# Patient Record
Sex: Female | Born: 1945
Health system: Southern US, Community
[De-identification: ages and names within clinical notes are randomized; demographics above are authoritative.]

## PROBLEM LIST (undated history)

## (undated) DIAGNOSIS — I10 Essential (primary) hypertension: Secondary | ICD-10-CM

## (undated) DIAGNOSIS — I639 Cerebral infarction, unspecified: Secondary | ICD-10-CM

## (undated) DIAGNOSIS — I480 Paroxysmal atrial fibrillation: Secondary | ICD-10-CM

## (undated) DIAGNOSIS — Z9889 Other specified postprocedural states: Secondary | ICD-10-CM

## (undated) DIAGNOSIS — Z79899 Other long term (current) drug therapy: Secondary | ICD-10-CM

## (undated) DIAGNOSIS — F32A Depression, unspecified: Secondary | ICD-10-CM

## (undated) DIAGNOSIS — K219 Gastro-esophageal reflux disease without esophagitis: Secondary | ICD-10-CM

## (undated) DIAGNOSIS — E785 Hyperlipidemia, unspecified: Secondary | ICD-10-CM

## (undated) DIAGNOSIS — E669 Obesity, unspecified: Secondary | ICD-10-CM

## (undated) HISTORY — DX: Other long term (current) drug therapy: Z79.899

## (undated) HISTORY — DX: Essential (primary) hypertension: I10

## (undated) HISTORY — PX: ANKLE SURGERY: SHX546

## (undated) HISTORY — DX: Paroxysmal atrial fibrillation: I48.0

## (undated) HISTORY — PX: CHOLECYSTECTOMY: SHX55

## (undated) HISTORY — PX: ABDOMINAL HYSTERECTOMY: SHX81

## (undated) HISTORY — PX: WRIST SURGERY: SHX841

## (undated) HISTORY — PX: APPENDECTOMY: SHX54

## (undated) HISTORY — DX: Hyperlipidemia, unspecified: E78.5

## (undated) HISTORY — PX: OTHER SURGICAL HISTORY: SHX169

## (undated) HISTORY — DX: Gastro-esophageal reflux disease without esophagitis: K21.9

## (undated) HISTORY — PX: EYE SURGERY: SHX253

## (undated) HISTORY — DX: Obesity, unspecified: E66.9

---

## 1999-03-05 ENCOUNTER — Ambulatory Visit (HOSPITAL_COMMUNITY): Admission: RE | Admit: 1999-03-05 | Discharge: 1999-03-05 | Payer: Self-pay | Admitting: Obstetrics and Gynecology

## 2000-03-19 ENCOUNTER — Encounter: Admission: RE | Admit: 2000-03-19 | Discharge: 2000-03-19 | Payer: Self-pay | Admitting: Obstetrics and Gynecology

## 2000-03-19 ENCOUNTER — Encounter: Payer: Self-pay | Admitting: Obstetrics and Gynecology

## 2001-02-09 ENCOUNTER — Encounter: Admission: RE | Admit: 2001-02-09 | Discharge: 2001-02-09 | Payer: Self-pay | Admitting: Family Medicine

## 2001-03-17 ENCOUNTER — Encounter: Payer: Self-pay | Admitting: Internal Medicine

## 2001-04-06 ENCOUNTER — Encounter: Payer: Self-pay | Admitting: Obstetrics and Gynecology

## 2001-04-06 ENCOUNTER — Encounter: Admission: RE | Admit: 2001-04-06 | Discharge: 2001-04-06 | Payer: Self-pay | Admitting: Obstetrics and Gynecology

## 2002-03-30 ENCOUNTER — Other Ambulatory Visit: Admission: RE | Admit: 2002-03-30 | Discharge: 2002-03-30 | Payer: Self-pay | Admitting: Gynecology

## 2003-04-10 ENCOUNTER — Other Ambulatory Visit: Admission: RE | Admit: 2003-04-10 | Discharge: 2003-04-10 | Payer: Self-pay | Admitting: Gynecology

## 2004-04-10 ENCOUNTER — Other Ambulatory Visit: Admission: RE | Admit: 2004-04-10 | Discharge: 2004-04-10 | Payer: Self-pay | Admitting: Gynecology

## 2005-02-18 ENCOUNTER — Ambulatory Visit: Payer: Self-pay | Admitting: Gastroenterology

## 2005-03-15 ENCOUNTER — Emergency Department (HOSPITAL_COMMUNITY): Admission: EM | Admit: 2005-03-15 | Discharge: 2005-03-15 | Payer: Self-pay | Admitting: Emergency Medicine

## 2005-03-18 ENCOUNTER — Encounter: Admission: RE | Admit: 2005-03-18 | Discharge: 2005-03-18 | Payer: Self-pay | Admitting: Orthopedic Surgery

## 2005-04-01 ENCOUNTER — Encounter: Admission: RE | Admit: 2005-04-01 | Discharge: 2005-04-01 | Payer: Self-pay | Admitting: Gynecology

## 2005-04-21 ENCOUNTER — Other Ambulatory Visit: Admission: RE | Admit: 2005-04-21 | Discharge: 2005-04-21 | Payer: Self-pay | Admitting: Gynecology

## 2005-10-28 ENCOUNTER — Ambulatory Visit: Payer: Self-pay | Admitting: Internal Medicine

## 2006-05-25 ENCOUNTER — Other Ambulatory Visit: Admission: RE | Admit: 2006-05-25 | Discharge: 2006-05-25 | Payer: Self-pay | Admitting: Gynecology

## 2006-06-09 ENCOUNTER — Encounter: Admission: RE | Admit: 2006-06-09 | Discharge: 2006-06-09 | Payer: Self-pay | Admitting: Gynecology

## 2007-05-31 ENCOUNTER — Other Ambulatory Visit: Admission: RE | Admit: 2007-05-31 | Discharge: 2007-05-31 | Payer: Self-pay | Admitting: Gynecology

## 2007-06-11 ENCOUNTER — Ambulatory Visit: Payer: Self-pay | Admitting: Internal Medicine

## 2007-06-12 ENCOUNTER — Encounter: Payer: Self-pay | Admitting: Internal Medicine

## 2007-06-12 DIAGNOSIS — F411 Generalized anxiety disorder: Secondary | ICD-10-CM | POA: Insufficient documentation

## 2007-06-12 DIAGNOSIS — R51 Headache: Secondary | ICD-10-CM

## 2007-06-12 DIAGNOSIS — F329 Major depressive disorder, single episode, unspecified: Secondary | ICD-10-CM

## 2007-06-12 DIAGNOSIS — R519 Headache, unspecified: Secondary | ICD-10-CM | POA: Insufficient documentation

## 2007-07-11 ENCOUNTER — Emergency Department (HOSPITAL_COMMUNITY): Admission: EM | Admit: 2007-07-11 | Discharge: 2007-07-11 | Payer: Self-pay | Admitting: Emergency Medicine

## 2007-07-21 HISTORY — PX: CARDIOVASCULAR STRESS TEST: SHX262

## 2008-01-12 ENCOUNTER — Encounter: Payer: Self-pay | Admitting: Internal Medicine

## 2008-01-19 ENCOUNTER — Encounter: Payer: Self-pay | Admitting: Internal Medicine

## 2008-06-23 ENCOUNTER — Encounter: Payer: Self-pay | Admitting: Internal Medicine

## 2008-06-27 ENCOUNTER — Encounter: Admission: RE | Admit: 2008-06-27 | Discharge: 2008-06-27 | Payer: Self-pay | Admitting: Gynecology

## 2009-01-26 ENCOUNTER — Emergency Department (HOSPITAL_COMMUNITY): Admission: EM | Admit: 2009-01-26 | Discharge: 2009-01-26 | Payer: Self-pay | Admitting: Emergency Medicine

## 2009-03-06 ENCOUNTER — Ambulatory Visit: Payer: Self-pay | Admitting: Internal Medicine

## 2009-03-06 DIAGNOSIS — M549 Dorsalgia, unspecified: Secondary | ICD-10-CM | POA: Insufficient documentation

## 2009-03-06 DIAGNOSIS — K219 Gastro-esophageal reflux disease without esophagitis: Secondary | ICD-10-CM

## 2009-05-31 ENCOUNTER — Ambulatory Visit: Payer: Self-pay | Admitting: Internal Medicine

## 2009-05-31 DIAGNOSIS — R197 Diarrhea, unspecified: Secondary | ICD-10-CM

## 2009-05-31 LAB — CONVERTED CEMR LAB
Basophils Relative: 0.7 % (ref 0.0–3.0)
Calcium: 9.2 mg/dL (ref 8.4–10.5)
Glucose, Bld: 85 mg/dL (ref 70–99)
HCT: 40.4 % (ref 36.0–46.0)
Hemoglobin: 14.2 g/dL (ref 12.0–15.0)
Lymphocytes Relative: 22.4 % (ref 12.0–46.0)
MCHC: 35.3 g/dL (ref 30.0–36.0)
MCV: 86.9 fL (ref 78.0–100.0)
Neutrophils Relative %: 63 % (ref 43.0–77.0)
Platelets: 219 10*3/uL (ref 150.0–400.0)
Potassium: 4.1 meq/L (ref 3.5–5.1)
Sodium: 142 meq/L (ref 135–145)

## 2009-06-01 ENCOUNTER — Encounter: Payer: Self-pay | Admitting: Internal Medicine

## 2009-09-04 ENCOUNTER — Encounter: Admission: RE | Admit: 2009-09-04 | Discharge: 2009-09-04 | Payer: Self-pay | Admitting: Gynecology

## 2010-07-18 ENCOUNTER — Ambulatory Visit: Payer: Self-pay | Admitting: Cardiology

## 2010-07-25 ENCOUNTER — Ambulatory Visit: Payer: Self-pay | Admitting: Cardiology

## 2010-07-25 ENCOUNTER — Ambulatory Visit (HOSPITAL_COMMUNITY)
Admission: RE | Admit: 2010-07-25 | Discharge: 2010-07-25 | Payer: Self-pay | Source: Home / Self Care | Admitting: Cardiology

## 2010-07-25 HISTORY — PX: OTHER SURGICAL HISTORY: SHX169

## 2010-09-10 ENCOUNTER — Encounter: Admission: RE | Admit: 2010-09-10 | Discharge: 2010-09-10 | Payer: Self-pay | Admitting: Gynecology

## 2011-01-01 ENCOUNTER — Ambulatory Visit (INDEPENDENT_AMBULATORY_CARE_PROVIDER_SITE_OTHER): Payer: Private Health Insurance - Indemnity | Admitting: *Deleted

## 2011-01-01 DIAGNOSIS — I10 Essential (primary) hypertension: Secondary | ICD-10-CM

## 2011-01-01 DIAGNOSIS — I4891 Unspecified atrial fibrillation: Secondary | ICD-10-CM

## 2011-02-27 LAB — URINALYSIS, ROUTINE W REFLEX MICROSCOPIC
Bilirubin Urine: NEGATIVE
Nitrite: NEGATIVE
Protein, ur: NEGATIVE mg/dL
pH: 5.5 (ref 5.0–8.0)

## 2011-02-27 LAB — DIFFERENTIAL
Basophils Relative: 1 % (ref 0–1)
Eosinophils Absolute: 0.2 10*3/uL (ref 0.0–0.7)
Eosinophils Relative: 4 % (ref 0–5)
Lymphs Abs: 1.2 10*3/uL (ref 0.7–4.0)
Monocytes Absolute: 0.7 10*3/uL (ref 0.1–1.0)
Monocytes Relative: 9 % (ref 3–12)
Neutrophils Relative %: 70 % (ref 43–77)

## 2011-02-27 LAB — PROTIME-INR
INR: 1 (ref 0.00–1.49)
Prothrombin Time: 12.8 seconds (ref 11.6–15.2)

## 2011-02-27 LAB — COMPREHENSIVE METABOLIC PANEL
AST: 24 U/L (ref 0–37)
Albumin: 3.6 g/dL (ref 3.5–5.2)
Glucose, Bld: 124 mg/dL — ABNORMAL HIGH (ref 70–99)
Potassium: 3.8 mEq/L (ref 3.5–5.1)

## 2011-02-27 LAB — CK TOTAL AND CKMB (NOT AT ARMC)
CK, MB: 1 ng/mL (ref 0.3–4.0)
Total CK: 51 U/L (ref 7–177)

## 2011-02-27 LAB — TROPONIN I: Troponin I: <0.01 ng/mL (ref 0.00–0.06)

## 2011-02-27 LAB — CBC
MCHC: 35.2 g/dL (ref 30.0–36.0)
Platelets: 219 10*3/uL (ref 150–400)
WBC: 7.2 10*3/uL (ref 4.0–10.5)

## 2011-02-27 LAB — LIPASE, BLOOD: Lipase: 27 U/L (ref 11–59)

## 2011-02-27 LAB — POCT CARDIAC MARKERS: CKMB, poc: <1 ng/mL — ABNORMAL LOW (ref 1.0–8.0)

## 2011-02-27 LAB — APTT: aPTT: 39 seconds — ABNORMAL HIGH (ref 24–37)

## 2011-03-11 ENCOUNTER — Other Ambulatory Visit: Payer: Self-pay | Admitting: Cardiology

## 2011-03-11 DIAGNOSIS — I480 Paroxysmal atrial fibrillation: Secondary | ICD-10-CM

## 2011-03-12 NOTE — Telephone Encounter (Signed)
escribe medication per fax request  

## 2011-06-24 ENCOUNTER — Inpatient Hospital Stay (HOSPITAL_COMMUNITY)
Admission: EM | Admit: 2011-06-24 | Discharge: 2011-06-25 | DRG: 310 | Disposition: A | Discharge status: Home / Self Care | Payer: Managed Care, Other (non HMO) | Attending: Family Medicine | Admitting: Family Medicine

## 2011-06-24 DIAGNOSIS — I10 Essential (primary) hypertension: Secondary | ICD-10-CM | POA: Diagnosis present

## 2011-06-24 DIAGNOSIS — Z7982 Long term (current) use of aspirin: Secondary | ICD-10-CM

## 2011-06-24 DIAGNOSIS — E785 Hyperlipidemia, unspecified: Secondary | ICD-10-CM | POA: Diagnosis present

## 2011-06-24 DIAGNOSIS — I4891 Unspecified atrial fibrillation: Principal | ICD-10-CM | POA: Diagnosis present

## 2011-06-24 DIAGNOSIS — F17200 Nicotine dependence, unspecified, uncomplicated: Secondary | ICD-10-CM | POA: Diagnosis present

## 2011-06-24 DIAGNOSIS — E349 Endocrine disorder, unspecified: Secondary | ICD-10-CM | POA: Diagnosis present

## 2011-06-25 ENCOUNTER — Emergency Department (HOSPITAL_COMMUNITY): Payer: Managed Care, Other (non HMO)

## 2011-06-25 LAB — CBC
HCT: 43.7 % (ref 36.0–46.0)
Hemoglobin: 14.6 g/dL (ref 12.0–15.0)
MCH: 29.1 pg (ref 26.0–34.0)
MCHC: 33.4 g/dL (ref 30.0–36.0)
RBC: 5.02 MIL/uL (ref 3.87–5.11)

## 2011-06-25 LAB — DIFFERENTIAL
Basophils Relative: 0 % (ref 0–1)
Lymphocytes Relative: 25 % (ref 12–46)
Monocytes Absolute: 0.8 10*3/uL (ref 0.1–1.0)
Monocytes Relative: 9 % (ref 3–12)
Neutro Abs: 5.1 10*3/uL (ref 1.7–7.7)
Neutrophils Relative %: 62 % (ref 43–77)

## 2011-06-25 LAB — BASIC METABOLIC PANEL
BUN: 16 mg/dL (ref 6–23)
CO2: 23 mEq/L (ref 19–32)
Calcium: 9.9 mg/dL (ref 8.4–10.5)
Chloride: 103 mEq/L (ref 96–112)
Creatinine, Ser: 0.74 mg/dL (ref 0.50–1.10)
Glucose, Bld: 107 mg/dL — ABNORMAL HIGH (ref 70–99)

## 2011-06-25 LAB — TSH: TSH: 5.877 u[IU]/mL — ABNORMAL HIGH (ref 0.350–4.500)

## 2011-06-25 LAB — PROTIME-INR: Prothrombin Time: 13.1 seconds (ref 11.6–15.2)

## 2011-06-25 LAB — T4, FREE: Free T4: 1.02 ng/dL (ref 0.80–1.80)

## 2011-06-26 NOTE — Discharge Summary (Signed)
[name], [name] NO.:  [id number]  MEDICAL RECORD NO.:  [id number]  LOCATION:  1227                         FACILITY:  [facility]  PHYSICIAN:  Brendia Sacks, MD    DATE OF BIRTH:  03-30-1946  DATE OF ADMISSION:  06/24/2011 DATE OF DISCHARGE:  06/25/2011                              DISCHARGE SUMMARY   PRIMARY CARE PHYSICIAN:  Dr. Ann Maki T. Stoneking.  PRIMARY CARDIOLOGIST:  Dr. Peter M. Swaziland.  CONDITION ON DISCHARGE:  Improved.  DISPOSITION:  Home.  DISCHARGE DIAGNOSES: 1. Atrial fibrillation with rapid ventricular response, resolved. 2. Elevated thyroid-stimulating hormone. 3. Known history of atrial fibrillation, managed on chronic aspirin     therapy.  HISTORY OF PRESENT ILLNESS:  This is a 65 year old woman with a known history of atrial fibrillation managed with metoprolol and aspirin who presented to the emergency room with palpitations.  She was found to be in atrial fibrillation with rapid ventricular response by her report with a heart rate of 210, was treated with diltiazem and admitted for further evaluation.  HOSPITAL COURSE:  The patient was given IV diltiazem, and by the admitting physician's note, she converted to sinus rhythm quickly.  She was observed in the ICU overnight, and her heart rate has been stable. Review of telemetry demonstrates sinus rhythm with sinus bradycardia, with no pauses or arrhythmias.  The patient has no chest pain or shortness of breath and would like to go home this morning.  She does note she has frequent episodes of palpitations that usually self resolve with cessation of activity.  Usually when she has these palpitations, her heart rate is in the 80s to 90s.  When she was at home, just most recent time, her heart rate was up to 110 and it did not cease after a couple of hours, so she came to the emergency room.  She had no chest pain with this and minimal shortness of breath.  She has no history  of coronary artery disease.  She had no echocardiogram on file; however, she reports Dr. Swaziland has one on the file at his office.  She had been compliant with her medications.  As her heart rate is in the 50s this morning, she is asymptomatic and she is normotensive, make no changes to metoprolol at this time.  This most recent episode occurred when she was taking beans out in the afternoon.  I have recommended she followup with Dr. Swaziland in the next 1-2 weeks and with Dr. Pete Glatter in about 2 weeks in regard to her hypothyroidism.  I did discuss the results of the TSH with her.  I would defer further evaluation including the possibility of T3 and T4 and repeat TSH testing to Dr. Pete Glatter.  The patient did have a troponin checked in the emergency room which was negative.  As she had no chest pain with this, no significant shortness of breath.  I do not think any further cardiac evaluation will be indicated.  Her history by her report is significant for repeated episodes of palpitations.  There is no evidence to suggest angina at this point.  Of note, one day prior to admission, she did have an episode of presyncope,  but no palpitations at that time.  She went inside and laid down at that time and vomited once and afterwards felt tired, otherwise had no significant clinic complaints.  Her history is suggestive of vasovagal presyncope.  IMAGING:  Chest x-ray, June 25, 2011:  No acute cardiopulmonary findings.  PERTINENT LABORATORY STUDIES: 1. Troponin at point of care was within normal limits. 2. CBC was unremarkable. 3. Basic metabolic panel was unremarkable. 4. TSH was elevated at 5.877. 5. Free T4 was within normal limits at 1.02.  EKG is not obtainable at this point.  It is not on the chart.  PHYSICAL EXAMINATION:  GENERAL:  On discharge, the patient is feeling well.  No chest pain or shortness of breath. VITALS:  Temperature is 97.6 and pulse 54.  Telemetry shows  sinus bradycardia, blood pressure is 117/53, respiratory rate 15, and saturation 99% on room air. CARDIOVASCULAR:  Regular rhythm, bradycardic.  No murmur, rub, or gallop. EXTREMITIES:  No lower extremity edema. RESPIRATION:  Clear to auscultation bilaterally.  No wheezes, rales, or rhonchi.  Normal respiratory effort.  DISCHARGE INSTRUCTIONS:  The patient will be discharged home today.  She should follow up with Dr. Pete Glatter in approximately 2 weeks to follow up her elevated TSH.  Now her free T4 was within normal limits. Followup with Dr. Peter Swaziland in 1-2 weeks.  Activities unrestricted. Continuous low-sodium heart-healthy diet.  Advised discontinuing smoking and using a pain patch if you are not smoking.  DISCHARGE MEDICATIONS:  No changes made at this time.  Continue, 1. Aspirin 81 mg p.o. daily. 2. Metoprolol 25 mg p.o. b.i.d. 3. Nicotine transdermal patch 14 mg per 24 hours daily if not smoking. 4. Zoloft 100 mg p.o. daily.  Things to follow up in the outpatient setting. 1. Elevated TSH.  See discussion above. 2. Atrial fibrillation.  See discussion above.  Time coordinating discharge is 25 minutes.     Brendia Sacks, MD    DG/MEDQ  D:  06/25/2011  T:  06/25/2011  Job:  [id number]  cc:   Hal T. Stoneking, M.D. Fax: [phone]  Peter M. Swaziland, M.D. Fax: [phone]  Electronically Signed by Brendia Sacks  on 06/26/2011 05:51:14 PM

## 2011-07-01 NOTE — H&P (Signed)
  [name], [name]            ACCOUNT NO.:  [id number]  MEDICAL RECORD NO.:  [id number]  LOCATION:  1227                         FACILITY:  [facility]  PHYSICIAN:  Gery Pray, MD      DATE OF BIRTH:  1946/02/10  DATE OF ADMISSION:  06/24/2011 DATE OF DISCHARGE:                             HISTORY & PHYSICAL   ADDENDUM:  EKG, atrial fibrillation with RVR.          ______________________________ Gery Pray, MD     DC/MEDQ  D:  06/25/2011  T:  06/25/2011  Job:  [id number]  Electronically Signed by Gery Pray MD on 07/01/2011 02:37:45 AM

## 2011-07-01 NOTE — H&P (Signed)
[name], SEEFELD            ACCOUNT NO.:  [id number]  MEDICAL RECORD NO.:  [id number]  LOCATION:  WLED                         FACILITY:  [facility]  PHYSICIAN:  Gery Pray, MD      DATE OF BIRTH:  06-May-1946  DATE OF ADMISSION:  06/24/2011 DATE OF DISCHARGE:                             HISTORY & PHYSICAL   PRIMARY CARE PHYSICIAN:  Hal T. Pete Glatter, MD  CARDIOLOGIST:  Peter M. Swaziland, MD  CODE STATUS:  Full code.  The patient goes to team 2.  CHIEF COMPLAINT:  Palpitation.  HISTORY OF PRESENT ILLNESS:  This is a pleasant 65 year old female with known history of AFib who is maintained on metoprolol.  She states she is compliant to medication.  She states she occasionally has palpitations, but they usually quickly self resolve; however, yesterday she was sitting on the porch, she was resting when she became dizzy and presyncopal.  She became diaphoretic.  There is no chest pain.  She went in to house, laid down, developed some nausea and vomiting once.  She states she felt it run down the rest of the day.  This evening, I spoke to her at around 2:30 she was outside, picking beans.  She states became hot and again developed palpitations.  At this time, she again stated no chest pains, but she had some mild shortness of breath.  No wheezing. She reports no fever, no chills, no abdominal pain, no recurrent presyncopal episode.  She decided to come to the ER.  The patient is not on anticoagulation.  She is maintained on aspirin daily.  History obtained from the patient who is reliable.  PAST MEDICAL HISTORY: 1. Atrial fibrillation. 2. Hypertension. 3. Dyslipidemia.  PAST SURGICAL HISTORY:  Hysterectomy.  MEDICATIONS: 1. Metoprolol 25 mg p.o. b.i.d. 2. Sertraline 100 mg half tablet daily. 3. Aspirin 81 mg daily.  ALLERGIES:  PENICILLIN, she states this cause loss of consciousness.  SOCIAL HISTORY:  Positive for tobacco.  Negative for alcohol or illicit drug.  She  lives with her husband.  The patient is not on home oxygen, does not use assistive devices.  FAMILY HISTORY:  Significant for dad with coronary artery disease and CHF, mom had Alzheimer's.  REVIEW OF SYSTEMS:  All 10-point systems reviewed are negative except as noted in HPI.  PHYSICAL EXAMINATION:  VITAL SIGNS:  Blood pressure 115/78, pulse 92, respirations 18, temperature 97.9. GENERAL:  Alert and oriented female, currently in no acute distress. EYES:  Pink conjunctivae.  PERRLA.  No scleral icterus. ENT:  Moist oral mucosa.  Trachea midline. NECK:  Supple.  No carotid bruits. LUNGS:  Clear to auscultation bilaterally.  No wheeze appreciated.  No use of accessory muscles. CARDIOVASCULAR:  Currently, regular rate and rhythm without murmurs, rubs, or gallops.  No JVD. ABDOMEN:  Soft, positive bowel sounds, nontender, nondistended.  No organomegaly. NEUROLOGIC:  Cranial nerves II through XII grossly intact.  Sensation intact.   MUSCULOSKELETAL: Strength 5/5 in all extremities. no edema SKIN:  No rashes.  No subcutaneous crepitations. PSYCHIATRIC:  Patient is alert, oriented, and appropriate.  LABORATORY DATA:  TSH 5.87.  White blood count 8.3, hemoglobin 14.6, platelets 230.  INR 0.97.  Sodium 137,  potassium 4, chloride 103, CO2 of 23, glucose 105, BUN 16, creatinine 0.74.  Troponin 0.02.  Chest x-ray shows acute cardiopulmonary findings.  ASSESSMENT AND PLAN: 1. Atrial fibrillation with rapid ventricular response.  Here in the     ER, the patient was placed on Cardizem drip.  She was just     converted to sinus rhythm.  We will admit the patient and restart     her home medications, metoprolol 25 mg p.o. b.i.d. and order for     discontinuing of the Cardizem drip.  Continue the patient's     aspirin. 2. Tobacco abuse.  Nicotine patch and counseling will be ordered. 3. Hypertension. 4. Dyslipidemia.  Continue home medications.  The patient will be     placed on DVT  prophylaxis.          ______________________________ Gery Pray, MD     DC/MEDQ  D:  06/25/2011  T:  06/25/2011  Job:  [id number]  Electronically Signed by Gery Pray MD on 07/01/2011 02:37:38 AM

## 2011-08-29 LAB — I-STAT 8, (EC8 V) (CONVERTED LAB)
BUN: 10
Bicarbonate: 26.7 — ABNORMAL HIGH
Chloride: 106
pCO2, Ven: 41.8 — ABNORMAL LOW
pH, Ven: 7.413 — ABNORMAL HIGH

## 2011-08-29 LAB — POCT CARDIAC MARKERS
Myoglobin, poc: 77.1
Operator id: 257131
Troponin i, poc: <0.05

## 2011-08-29 LAB — POCT I-STAT CREATININE
Creatinine, Ser: 0.9
Operator id: 257131

## 2011-08-29 LAB — D-DIMER, QUANTITATIVE: D-Dimer, Quant: 0.24

## 2011-09-03 ENCOUNTER — Other Ambulatory Visit: Payer: Self-pay | Admitting: Gynecology

## 2011-09-03 DIAGNOSIS — Z1231 Encounter for screening mammogram for malignant neoplasm of breast: Secondary | ICD-10-CM

## 2011-09-10 ENCOUNTER — Telehealth: Payer: Self-pay | Admitting: *Deleted

## 2011-09-10 NOTE — Telephone Encounter (Signed)
Husband Aurther Loft) called stating her BP this AM was 88/57. States she had to get up 4 times during night to urinate. Also had severe pain in back and legs;fatiqued. Has not taken her Metoprolol this AM. Per Dr. Swaziland advised to push fluids and hold Metoprolol today. If sx or BP not any better then needs to call Dr. Pete Glatter.

## 2011-09-15 ENCOUNTER — Ambulatory Visit
Admission: RE | Admit: 2011-09-15 | Discharge: 2011-09-15 | Disposition: A | Discharge status: Home / Self Care | Payer: Managed Care, Other (non HMO) | Source: Ambulatory Visit | Attending: Gynecology | Admitting: Gynecology

## 2011-09-15 ENCOUNTER — Other Ambulatory Visit: Payer: Self-pay | Admitting: Gynecology

## 2011-09-15 ENCOUNTER — Other Ambulatory Visit: Payer: Self-pay | Admitting: Cardiology

## 2011-09-15 DIAGNOSIS — Z1231 Encounter for screening mammogram for malignant neoplasm of breast: Secondary | ICD-10-CM

## 2011-09-16 ENCOUNTER — Other Ambulatory Visit: Payer: Self-pay | Admitting: Cardiology

## 2011-11-27 ENCOUNTER — Encounter: Payer: Self-pay | Admitting: Nurse Practitioner

## 2011-11-27 ENCOUNTER — Encounter: Payer: Self-pay | Admitting: Cardiology

## 2011-11-27 ENCOUNTER — Ambulatory Visit (INDEPENDENT_AMBULATORY_CARE_PROVIDER_SITE_OTHER): Payer: Managed Care, Other (non HMO) | Admitting: Cardiology

## 2011-11-27 VITALS — BP 108/58 | HR 64 | Ht 66.0 in | Wt 203.8 lb

## 2011-11-27 DIAGNOSIS — I480 Paroxysmal atrial fibrillation: Secondary | ICD-10-CM | POA: Insufficient documentation

## 2011-11-27 DIAGNOSIS — I4891 Unspecified atrial fibrillation: Secondary | ICD-10-CM

## 2011-11-27 MED ORDER — FLECAINIDE ACETATE 50 MG PO TABS: 50.0000 mg | ORAL_TABLET | Freq: Two times a day (BID) | ORAL | Status: DC

## 2011-11-27 NOTE — Patient Instructions (Signed)
We will try you on Flecainide 50 mg twice a day.  I will see you again in 1 month to see how you are doing.

## 2011-11-27 NOTE — Progress Notes (Signed)
   [name] Date of Birth: 01-Jul-1946 Medical Record #[id number]  History of Present Illness: [name] is seen today for followup of her paroxysmal atrial fibrillation. She reports increased frequency and severity of her symptoms. Previously her symptoms would last only minutes and would resolve after taking her metoprolol. Now her atrial fibrillation appears to be lasting all night long. She has had increased frequency of symptoms over the past 6 weeks. There've been no changes in her medical therapy. She does report a flulike illness recently. Her atrial fibrillation episodes are occurring one to 3 times per week.  Current Outpatient Prescriptions on File Prior to Visit  Medication Sig Dispense Refill  . metoprolol tartrate (LOPRESSOR) 25 MG tablet TAKE 1 TABLET TWICE A DAY  60 tablet  5    Allergies  Allergen Reactions  . Penicillins     Past Medical History  Diagnosis Date  . Atrial fibrillation     paroxysmal  . Hypertension   . Dyslipidemia   . GERD (gastroesophageal reflux disease)   . Obesity     Past Surgical History  Procedure Date  . Wrist surgery     RIGHT WRIST  . Ankle surgery   . Stress echo test 07/25/2010    EF 60%  . Cardiovascular stress test 07/21/2007    EF 80%, NORMAL NONDIAGONISTIC FOR ISCHEMIA  . Abdominal hysterectomy     History  Smoking status  . Former Smoker  . Quit date: 11/17/2010  Smokeless tobacco  . Not on file    History  Alcohol Use No    Family History  Problem Relation Age of Onset  . Alzheimer's disease Mother   . Heart disease Father   . Heart attack Sister 100  . Lung cancer Brother   . Stroke Sister     Review of Systems: The review of systems is positive for palpitations.  All other systems were reviewed and are negative.  Physical Exam: BP 108/58  Pulse 64  Ht 5\' 6"  (1.676 m)  Wt 203 lb 12.8 oz (92.443 kg)  BMI 32.89 kg/m2 The patient is alert and oriented x 3.  The mood and affect are normal.   The skin is warm and dry.  Color is normal.  The HEENT exam reveals that the sclera are nonicteric.  The mucous membranes are moist.  The carotids are 2+ without bruits.  There is no thyromegaly.  There is no JVD.  The lungs are clear.  The chest wall is non tender.  The heart exam reveals a regular rate with a normal S1 and S2.  There are no murmurs, gallops, or rubs.  The PMI is not displaced.   Abdominal exam reveals good bowel sounds.  There is no guarding or rebound.  There is no hepatosplenomegaly or tenderness.  There are no masses.  Exam of the legs reveal no clubbing, cyanosis, or edema.  The legs are without rashes.  The distal pulses are intact.  Cranial nerves II - XII are intact.  Motor and sensory functions are intact.  The gait is normal.   LABORATORY DATA: ECG demonstrates normal sinus rhythm with a normal ECG.  Assessment / Plan:

## 2011-11-27 NOTE — Assessment & Plan Note (Addendum)
She has increased frequency and severity of her symptoms. She had a normal stress echo in September of 2011. She is well beta blocked on metoprolol. I recommended flecainide 50 mg twice a day to see if we can suppress her arrhythmia. I will have her followup with me in one month with an ECG.

## 2012-01-02 ENCOUNTER — Ambulatory Visit (INDEPENDENT_AMBULATORY_CARE_PROVIDER_SITE_OTHER): Payer: Managed Care, Other (non HMO) | Admitting: Cardiology

## 2012-01-02 ENCOUNTER — Encounter: Payer: Self-pay | Admitting: Cardiology

## 2012-01-02 VITALS — BP 124/72 | HR 64 | Ht 66.0 in | Wt 203.0 lb

## 2012-01-02 DIAGNOSIS — I4891 Unspecified atrial fibrillation: Secondary | ICD-10-CM

## 2012-01-02 DIAGNOSIS — I480 Paroxysmal atrial fibrillation: Secondary | ICD-10-CM

## 2012-01-02 NOTE — Progress Notes (Signed)
[name] Date of Birth: 11-22-1945 Medical Record #[id number]  History of Present Illness: [name] is seen today for followup of her paroxysmal atrial fibrillation. Since she was started on flecainide she has only had 2 episodes of atrial fibrillation. These each lasted 20-30 minutes. His is a marked improvement over her previous symptoms. She is tolerating the medications well. She has started working out at the gym 2-3 days a week.  Current Outpatient Prescriptions on File Prior to Visit  Medication Sig Dispense Refill  . aspirin 81 MG tablet Take 81 mg by mouth daily.      . cholecalciferol (VITAMIN D) 1000 UNITS tablet Take 1,000 Units by mouth daily.      . fish oil-omega-3 fatty acids 1000 MG capsule Take 2 g by mouth daily.      . flecainide (TAMBOCOR) 50 MG tablet Take 1 tablet (50 mg total) by mouth 2 (two) times daily.  60 tablet  11  . metoprolol tartrate (LOPRESSOR) 25 MG tablet TAKE 1 TABLET TWICE A DAY  60 tablet  5  . Multiple Vitamin (MULTI-VITAMIN PO) Take by mouth daily.      . sertraline (ZOLOFT) 50 MG tablet Take 50 mg by mouth daily.        Allergies  Allergen Reactions  . Penicillins     Past Medical History  Diagnosis Date  . Atrial fibrillation     paroxysmal  . Hypertension   . Dyslipidemia   . GERD (gastroesophageal reflux disease)   . Obesity     Past Surgical History  Procedure Date  . Wrist surgery     RIGHT WRIST  . Ankle surgery   . Stress echo test 07/25/2010    EF 60%  . Cardiovascular stress test 07/21/2007    EF 80%, NORMAL NONDIAGONISTIC FOR ISCHEMIA  . Abdominal hysterectomy     History  Smoking status  . Former Smoker  . Quit date: 11/17/2010  Smokeless tobacco  . Not on file    History  Alcohol Use No    Family History  Problem Relation Age of Onset  . Alzheimer's disease Mother   . Heart disease Father   . Heart attack Sister 57  . Lung cancer Brother   . Stroke Sister     Review of Systems: The  review of systems is positive for palpitations.  All other systems were reviewed and are negative.  Physical Exam: BP 124/72  Pulse 64  Ht 5\' 6"  (1.676 m)  Wt 203 lb (92.08 kg)  BMI 32.76 kg/m2 The patient is alert and oriented x 3.  The mood and affect are normal.  The skin is warm and dry.  Color is normal.  The HEENT exam reveals that the sclera are nonicteric.  The mucous membranes are moist.  The carotids are 2+ without bruits.  There is no thyromegaly.  There is no JVD.  The lungs are clear.  The chest wall is non tender.  The heart exam reveals a regular rate with a normal S1 and S2.  There are no murmurs, gallops, or rubs.  The PMI is not displaced.   Abdominal exam reveals good bowel sounds.  There is no guarding or rebound.  There is no hepatosplenomegaly or tenderness.  There are no masses.  Exam of the legs reveal no clubbing, cyanosis, or edema.  The legs are without rashes.  The distal pulses are intact.  Cranial nerves II - XII are intact.  Motor and sensory  functions are intact.  The gait is normal.   LABORATORY DATA: ECG demonstrates normal sinus rhythm with occasional PACs, otherwise  normal ECG.  Assessment / Plan:

## 2012-01-02 NOTE — Assessment & Plan Note (Signed)
Significant improvement in her symptoms of atrial fibrillation on flecainide 50 mg twice a day. We will continue with her current therapy. If she has significant breakthrough she may take an extra tablet. Her [place] score is 1. We will continue with aspirin at this time. I will see her back again in 6 months.

## 2012-01-02 NOTE — Patient Instructions (Signed)
Continue your current therapy. If you have longer episodes of atrial fib you could take an extra Flecainide.  I will see you in 6 months.

## 2012-03-29 ENCOUNTER — Other Ambulatory Visit: Payer: Self-pay | Admitting: *Deleted

## 2012-03-29 MED ORDER — METOPROLOL TARTRATE 25 MG PO TABS: 25.0000 mg | ORAL_TABLET | Freq: Two times a day (BID) | ORAL | Status: DC

## 2012-04-02 ENCOUNTER — Ambulatory Visit (INDEPENDENT_AMBULATORY_CARE_PROVIDER_SITE_OTHER): Payer: Managed Care, Other (non HMO) | Admitting: Nurse Practitioner

## 2012-04-02 ENCOUNTER — Encounter: Payer: Self-pay | Admitting: Nurse Practitioner

## 2012-04-02 VITALS — BP 122/70 | HR 60 | Ht 66.0 in | Wt 203.0 lb

## 2012-04-02 DIAGNOSIS — I4891 Unspecified atrial fibrillation: Secondary | ICD-10-CM

## 2012-04-02 DIAGNOSIS — I480 Paroxysmal atrial fibrillation: Secondary | ICD-10-CM

## 2012-04-02 LAB — BASIC METABOLIC PANEL
BUN: 14 mg/dL (ref 6–23)
CO2: 24 mEq/L (ref 19–32)
Calcium: 9.5 mg/dL (ref 8.4–10.5)
Chloride: 109 mEq/L (ref 96–112)
Creatinine, Ser: 0.9 mg/dL (ref 0.4–1.2)
GFR: 71.47 mL/min (ref >60.00)
Glucose, Bld: 86 mg/dL (ref 70–99)
Potassium: 4.3 mEq/L (ref 3.5–5.1)
Sodium: 141 mEq/L (ref 135–145)

## 2012-04-02 LAB — TSH: TSH: 2.66 u[IU]/mL (ref 0.35–5.50)

## 2012-04-02 MED ORDER — FLECAINIDE ACETATE 50 MG PO TABS: ORAL_TABLET | ORAL | Status: DC

## 2012-04-02 NOTE — Assessment & Plan Note (Signed)
Patient presents with recurrent PAF. She is on Flecainide. We are checking chemistries, TSH and flecainide level today. Will update her stress echo. I have increased her Flecainide to 100 mg in the am and 50 mg in the pm. Dr. Swaziland and I will see her back in about one month. If she continues, could consider event monitor and possible EP referral. Patient is agreeable to this plan and will call if any problems develop in the interim.

## 2012-04-02 NOTE — Progress Notes (Signed)
[name] Date of Birth: 08/17/1946 Medical Record #[id number]  History of Present Illness: [name] is seen today for a work in visit. She is seen for Dr. Swaziland. She has a history of PAF, obesity, prior tobacco abuse, HTN and HLD. She has had a negative stress echo back in 2011. She has been maintained on chronic Flecainide since January of this year. Her CHADs is only 1.   She comes in today. She is here alone. She has had recurrent atrial fib. She notes that when she was here in February she was doing very well. The flecainide was working well up until the past couple of weeks. She is having more episodes that initially only lasted for a few minutes. She had one earlier this week on Wednesday that lasted for several hours. She feels short winded and lightheaded with these episodes. Did not take any extra medicine. No recent labs. Does report a general decrease in her activity level. No actual chest pain. Still trying to to go the gym but this past Tuesday had "no energy" and was not in atrial fib.   Current Outpatient Prescriptions on File Prior to Visit  Medication Sig Dispense Refill  . aspirin 81 MG tablet Take 81 mg by mouth daily.      . metoprolol tartrate (LOPRESSOR) 25 MG tablet Take 1 tablet (25 mg total) by mouth 2 (two) times daily.  60 tablet  5  . sertraline (ZOLOFT) 50 MG tablet Take 25 mg by mouth daily.       Marland Kitchen DISCONTD: flecainide (TAMBOCOR) 50 MG tablet Take 1 tablet (50 mg total) by mouth 2 (two) times daily.  60 tablet  11    Allergies  Allergen Reactions  . Penicillins     Past Medical History  Diagnosis Date  . PAF (paroxysmal atrial fibrillation)     normal stress echo in 2011  . Hypertension   . Dyslipidemia   . GERD (gastroesophageal reflux disease)   . Obesity     Past Surgical History  Procedure Date  . Wrist surgery     RIGHT WRIST  . Ankle surgery   . Stress echo test 07/25/2010    EF 60%  . Cardiovascular stress test 07/21/2007   EF 80%, NORMAL NONDIAGONISTIC FOR ISCHEMIA  . Abdominal hysterectomy     History  Smoking status  . Current Everyday Smoker  . Last Attempt to Quit: 11/17/2010  Smokeless tobacco  . Not on file    History  Alcohol Use No    Family History  Problem Relation Age of Onset  . Alzheimer's disease Mother   . Heart disease Father   . Stroke Sister   . Lung cancer Brother   . Cancer Brother     Review of Systems: The review of systems is per the HPI.  All other systems were reviewed and are negative.  Physical Exam: BP 122/70  Pulse 60  Ht 5\' 6"  (1.676 m)  Wt 203 lb (92.08 kg)  BMI 32.76 kg/m2 Patient is very pleasant and in no acute distress. She is obese. Skin is warm and dry. Color is normal.  HEENT is unremarkable. Normocephalic/atraumatic. PERRL. Sclera are nonicteric. Neck is supple. No masses. No JVD. Lungs are clear. Cardiac exam shows a regular rate and rhythm. Abdomen is obese but soft. Extremities are without edema. Gait and ROM are intact. No gross neurologic deficits noted.  LABORATORY DATA: EKG today shows sinus bradycardia with a rate of 59.  Lab Results  Component Value Date   WBC 8.3 06/25/2011   HGB 14.6 06/25/2011   HCT 43.7 06/25/2011   PLT 230 06/25/2011   GLUCOSE 107* 06/25/2011   ALT 33 01/26/2009   AST 24 01/26/2009   NA 137 06/25/2011   K 4.0 06/25/2011   CL 103 06/25/2011   CREATININE 0.74 06/25/2011   BUN 16 06/25/2011   CO2 23 06/25/2011   TSH 5.877* 06/25/2011   INR 0.97 06/25/2011     Assessment / Plan:

## 2012-04-02 NOTE — Patient Instructions (Signed)
We are going to increase the Flecainide to 100 mg in the am and 50 mg in the pm  We are checking labs today  We are going to arrange for stress echocardiogram  Dr. Swaziland and I will see you in a month.   Call the [facility] office at [phone] if you have any questions, problems or concerns.

## 2012-04-08 LAB — FLECAINIDE LEVEL: Flecainide: 0.17 ug/mL — ABNORMAL LOW (ref 0.20–1.00)

## 2012-04-09 ENCOUNTER — Encounter: Payer: Self-pay | Admitting: Cardiology

## 2012-04-09 ENCOUNTER — Ambulatory Visit (HOSPITAL_BASED_OUTPATIENT_CLINIC_OR_DEPARTMENT_OTHER): Payer: Managed Care, Other (non HMO)

## 2012-04-09 ENCOUNTER — Ambulatory Visit (HOSPITAL_COMMUNITY): Payer: Managed Care, Other (non HMO) | Attending: Cardiology

## 2012-04-09 DIAGNOSIS — E785 Hyperlipidemia, unspecified: Secondary | ICD-10-CM | POA: Insufficient documentation

## 2012-04-09 DIAGNOSIS — E669 Obesity, unspecified: Secondary | ICD-10-CM | POA: Insufficient documentation

## 2012-04-09 DIAGNOSIS — I10 Essential (primary) hypertension: Secondary | ICD-10-CM | POA: Insufficient documentation

## 2012-04-09 DIAGNOSIS — I4891 Unspecified atrial fibrillation: Secondary | ICD-10-CM | POA: Insufficient documentation

## 2012-04-09 DIAGNOSIS — R0989 Other specified symptoms and signs involving the circulatory and respiratory systems: Secondary | ICD-10-CM

## 2012-04-09 DIAGNOSIS — F17200 Nicotine dependence, unspecified, uncomplicated: Secondary | ICD-10-CM | POA: Insufficient documentation

## 2012-04-09 DIAGNOSIS — R0609 Other forms of dyspnea: Secondary | ICD-10-CM | POA: Insufficient documentation

## 2012-04-19 ENCOUNTER — Other Ambulatory Visit: Payer: Self-pay | Admitting: *Deleted

## 2012-04-19 DIAGNOSIS — I4891 Unspecified atrial fibrillation: Secondary | ICD-10-CM

## 2012-04-19 MED ORDER — FLECAINIDE ACETATE 50 MG PO TABS: ORAL_TABLET | ORAL | Status: DC

## 2012-05-03 ENCOUNTER — Ambulatory Visit (INDEPENDENT_AMBULATORY_CARE_PROVIDER_SITE_OTHER): Payer: Managed Care, Other (non HMO) | Admitting: Nurse Practitioner

## 2012-05-03 ENCOUNTER — Encounter: Payer: Self-pay | Admitting: Nurse Practitioner

## 2012-05-03 VITALS — BP 140/80 | HR 60 | Ht 66.0 in | Wt 206.8 lb

## 2012-05-03 DIAGNOSIS — R109 Unspecified abdominal pain: Secondary | ICD-10-CM

## 2012-05-03 DIAGNOSIS — I480 Paroxysmal atrial fibrillation: Secondary | ICD-10-CM

## 2012-05-03 DIAGNOSIS — I4891 Unspecified atrial fibrillation: Secondary | ICD-10-CM

## 2012-05-03 LAB — URINALYSIS, ROUTINE W REFLEX MICROSCOPIC
Bilirubin Urine: NEGATIVE
Ketones, ur: NEGATIVE
Leukocytes, UA: NEGATIVE
Nitrite: NEGATIVE
Specific Gravity, Urine: >=1.03 (ref 1.000–1.030)
Total Protein, Urine: NEGATIVE
Urine Glucose: NEGATIVE
Urobilinogen, UA: 0.2 (ref 0.0–1.0)
pH: 5.5 (ref 5.0–8.0)

## 2012-05-03 NOTE — Patient Instructions (Addendum)
Stay on your current medicines  We will check a urine today.  See Dr. Swaziland in 2 months with an EKG.  Call the [facility] office at [phone] if you have any questions, problems or concerns.

## 2012-05-03 NOTE — Assessment & Plan Note (Signed)
She is doing well with her current regimen. No changes today. Will check a UA to follow up her complaint of flank pain and dysuria. I will have her see Dr. Swaziland back in about 2 months. Encouraged her to stay active. Watch the heat and stay hydrated. Patient is agreeable to this plan and will call if any problems develop in the interim.

## 2012-05-03 NOTE — Progress Notes (Signed)
[name] Date of Birth: 07/07/1946 Medical Record #[id number]  History of Present Illness: [name] is seen today for a follow up visit. She is seen for Dr. Sw[place]il[place]. She has a history of PAF, obesity, prior tobacco abuse, HTN and HLD. She is now on Flecainide and has a recent negative stress echo with a recent increase in her dose.   She comes in today. She is here with her grandson. She is doing well. Very little in the way of palpitations. Overall feels pretty good. Some fatigue but feels its related to her beta blocker. No chest pain. She is happy with how she is currently doing with her current regimen. Her only issue is some right sided flank pain and she thinks her urine has more of an odor. She is requesting a UA today.   Current Outpatient Prescriptions on File Prior to Visit  Medication Sig Dispense Refill  . aspirin 81 MG tablet Take 81 mg by mouth daily.      . flecainide (TAMBOCOR) 50 MG tablet Take 100 mg in the am and 50 mg in the evening  90 tablet  11  . metoprolol tartrate (LOPRESSOR) 25 MG tablet Take 1 tablet (25 mg total) by mouth 2 (two) times daily.  60 tablet  5  . sertraline (ZOLOFT) 50 MG tablet Take 25 mg by mouth daily.         Allergies  Allergen Reactions  . Penicillins     Past Medical History  Diagnosis Date  . PAF (paroxysmal atrial fibrillation)     normal stress echo in 2011  . Hypertension   . Dyslipidemia   . GERD (gastroesophageal reflux disease)   . Obesity   . High risk medication use     on Flecainide since January of 2013    Past Surgical History  Procedure Date  . Wrist surgery     RIGHT WRIST  . Ankle surgery   . Stress echo test 07/25/2010    EF 60%  . Cardiovascular stress test 07/21/2007    EF 80%, NORMAL NONDIAGONISTIC FOR ISCHEMIA  . Abdominal hysterectomy     History  Smoking status  . Current Everyday Smoker  Smokeless tobacco  . Not on file    History  Alcohol Use No    Family History  Problem  Relation Age of Onset  . Alzheimer's disease Mother   . Heart disease Father   . Stroke Sister   . Lung cancer Brother   . Cancer Brother     Review of Systems: The review of systems is positive for some fatigue.  All other systems were reviewed and are negative.  Physical Exam: BP 140/80  Pulse 60  Ht 5\' 6"  (1.676 m)  Wt 206 lb 12.8 oz (93.804 kg)  BMI 33.38 kg/m2 Patient is very pleasant and in no acute distress. Skin is warm and dry. Color is normal.  HEENT is unremarkable. Normocephalic/atraumatic. PERRL. Sclera are nonicteric. Neck is supple. No masses. No JVD. Lungs are clear. Cardiac exam shows a regular rate and rhythm. Abdomen is soft. Extremities are without edema. Gait and ROM are intact. No gross neurologic deficits noted.   LABORATORY DATA:  Stress Echo Study Conclusions  - Stress ECG conclusions: There was frequent pacs and runs of PAT. The stress ECG was negative for ischemia. - Staged echo: There was no echocardiographic evidence for stress-induced ischemia. Impressions:  - Stress echocardiogram with no chest pain and no ST changes; frequent PACs and occasional  run of PAT; no stress-induced wall motion abnormalities.  Lab Results  Component Value Date   WBC 8.3 06/25/2011   HGB 14.6 06/25/2011   HCT 43.7 06/25/2011   PLT 230 06/25/2011   GLUCOSE 86 04/02/2012   ALT 33 01/26/2009   AST 24 01/26/2009   NA 141 04/02/2012   K 4.3 04/02/2012   CL 109 04/02/2012   CREATININE 0.9 04/02/2012   BUN 14 04/02/2012   CO2 24 04/02/2012   TSH 2.66 04/02/2012   INR 0.97 06/25/2011    Flecainide level 0.17  Assessment / Plan:

## 2012-07-06 ENCOUNTER — Encounter: Payer: Self-pay | Admitting: Cardiology

## 2012-07-06 ENCOUNTER — Ambulatory Visit (INDEPENDENT_AMBULATORY_CARE_PROVIDER_SITE_OTHER): Payer: Managed Care, Other (non HMO) | Admitting: Cardiology

## 2012-07-06 VITALS — BP 122/66 | HR 56 | Ht 66.0 in | Wt 204.4 lb

## 2012-07-06 DIAGNOSIS — I4891 Unspecified atrial fibrillation: Secondary | ICD-10-CM

## 2012-07-06 DIAGNOSIS — I10 Essential (primary) hypertension: Secondary | ICD-10-CM

## 2012-07-06 NOTE — Progress Notes (Signed)
   [name] Date of Birth: 1945/12/16 Medical Record #[id number]  History of Present Illness: [name] is seen today for a follow up visit.  She has a history of PAF, obesity, prior tobacco abuse, HTN and HLD. Since her flecainide dose was increased she has had no recurrent palpitations. She is tolerating this medication well. Reports that she quit smoking one week ago.    Current Outpatient Prescriptions on File Prior to Visit  Medication Sig Dispense Refill  . aspirin 81 MG tablet Take 81 mg by mouth daily.      . flecainide (TAMBOCOR) 50 MG tablet Take 100 mg in the am and 50 mg in the evening  90 tablet  11  . metoprolol tartrate (LOPRESSOR) 25 MG tablet Take 1 tablet (25 mg total) by mouth 2 (two) times daily.  60 tablet  5  . sertraline (ZOLOFT) 50 MG tablet Take 25 mg by mouth daily.         Allergies  Allergen Reactions  . Penicillins     Past Medical History  Diagnosis Date  . PAF (paroxysmal atrial fibrillation)     normal stress echo in 2011  . Hypertension   . Dyslipidemia   . GERD (gastroesophageal reflux disease)   . Obesity   . High risk medication use     on Flecainide since January of 2013    Past Surgical History  Procedure Date  . Wrist surgery     RIGHT WRIST  . Ankle surgery   . Stress echo test 07/25/2010    EF 60%  . Cardiovascular stress test 07/21/2007    EF 80%, NORMAL NONDIAGONISTIC FOR ISCHEMIA  . Abdominal hysterectomy     History  Smoking status  . Current Everyday Smoker  Smokeless tobacco  . Not on file    History  Alcohol Use No    Family History  Problem Relation Age of Onset  . Alzheimer's disease Mother   . Heart disease Father   . Stroke Sister   . Lung cancer Brother   . Cancer Brother     Review of Systems: The review of systems is positive for some fatigue.  All other systems were reviewed and are negative.  Physical Exam: BP 122/66  Pulse 56  Ht 5\' 6"  (1.676 m)  Wt 92.715 kg (204 lb 6.4 oz)  BMI  32.99 kg/m2 Patient is very pleasant and in no acute distress. Skin is warm and dry. Color is normal.  HEENT is unremarkable. Normocephalic/atraumatic. PERRL. Sclera are nonicteric. Neck is supple. No masses. No JVD. Lungs are clear. Cardiac exam shows a regular rate and rhythm. Abdomen is soft. Extremities are without edema. Gait and ROM are intact. No gross neurologic deficits noted.   LABORATORY DATA: ECG today demonstrates sinus bradycardia with a rate of 55 beats per minute. It is otherwise normal.  Assessment / Plan: 1. Paroxysmal atrial fibrillation. Well controlled on flecainide. We will continue with 100 mg twice daily. Continue Lopressor. Given [place] score of 1, we'll continue with aspirin only. Next  2. Hypertension, well controlled.

## 2012-07-06 NOTE — Patient Instructions (Signed)
Continue your current therapy and good luck quitting smoking  I will see you again in 6 months.

## 2012-09-14 ENCOUNTER — Other Ambulatory Visit: Payer: Self-pay | Admitting: Gynecology

## 2012-09-14 DIAGNOSIS — Z1231 Encounter for screening mammogram for malignant neoplasm of breast: Secondary | ICD-10-CM

## 2012-09-21 ENCOUNTER — Other Ambulatory Visit: Payer: Self-pay | Admitting: Gynecology

## 2012-10-05 ENCOUNTER — Other Ambulatory Visit: Payer: Self-pay | Admitting: *Deleted

## 2012-10-05 MED ORDER — METOPROLOL TARTRATE 25 MG PO TABS: 25.0000 mg | ORAL_TABLET | Freq: Two times a day (BID) | ORAL | Status: DC

## 2012-10-25 ENCOUNTER — Ambulatory Visit: Payer: Managed Care, Other (non HMO)

## 2012-11-26 ENCOUNTER — Ambulatory Visit
Admission: RE | Admit: 2012-11-26 | Discharge: 2012-11-26 | Disposition: A | Discharge status: Home / Self Care | Payer: Managed Care, Other (non HMO) | Source: Ambulatory Visit | Attending: Gynecology | Admitting: Gynecology

## 2012-11-26 DIAGNOSIS — Z1231 Encounter for screening mammogram for malignant neoplasm of breast: Secondary | ICD-10-CM | POA: Diagnosis not present

## 2013-03-01 DIAGNOSIS — K644 Residual hemorrhoidal skin tags: Secondary | ICD-10-CM | POA: Diagnosis not present

## 2013-03-01 DIAGNOSIS — N390 Urinary tract infection, site not specified: Secondary | ICD-10-CM | POA: Diagnosis not present

## 2013-03-01 DIAGNOSIS — K649 Unspecified hemorrhoids: Secondary | ICD-10-CM | POA: Diagnosis not present

## 2013-04-14 ENCOUNTER — Other Ambulatory Visit: Payer: Self-pay | Admitting: *Deleted

## 2013-04-14 MED ORDER — METOPROLOL TARTRATE 25 MG PO TABS: 25.0000 mg | ORAL_TABLET | Freq: Two times a day (BID) | ORAL | Status: DC

## 2013-04-23 ENCOUNTER — Other Ambulatory Visit: Payer: Self-pay | Admitting: Cardiology

## 2013-04-25 NOTE — Telephone Encounter (Signed)
07/06/2012 11:15 AM Office Visit flecainide (TAMBOCOR) 50 MG tablet  Take 100 mg in the am and 50 mg in the evening   90 tablet   11   Assessment / Plan: 1. Paroxysmal atrial fibrillation. Well controlled on flecainide. We will continue with 100 mg twice daily. Continue Lopressor. Given [place] score of 1, we'll continue with aspirin only. Next

## 2013-05-21 ENCOUNTER — Emergency Department (HOSPITAL_COMMUNITY)
Admission: EM | Admit: 2013-05-21 | Discharge: 2013-05-21 | Disposition: A | Discharge status: Home / Self Care | Payer: Medicare Other | Attending: Emergency Medicine | Admitting: Emergency Medicine

## 2013-05-21 ENCOUNTER — Emergency Department (HOSPITAL_COMMUNITY): Payer: Medicare Other

## 2013-05-21 DIAGNOSIS — K219 Gastro-esophageal reflux disease without esophagitis: Secondary | ICD-10-CM | POA: Diagnosis not present

## 2013-05-21 DIAGNOSIS — E669 Obesity, unspecified: Secondary | ICD-10-CM | POA: Diagnosis not present

## 2013-05-21 DIAGNOSIS — Z7982 Long term (current) use of aspirin: Secondary | ICD-10-CM | POA: Diagnosis not present

## 2013-05-21 DIAGNOSIS — F17200 Nicotine dependence, unspecified, uncomplicated: Secondary | ICD-10-CM | POA: Insufficient documentation

## 2013-05-21 DIAGNOSIS — Z8679 Personal history of other diseases of the circulatory system: Secondary | ICD-10-CM | POA: Diagnosis not present

## 2013-05-21 DIAGNOSIS — Z79899 Other long term (current) drug therapy: Secondary | ICD-10-CM | POA: Insufficient documentation

## 2013-05-21 DIAGNOSIS — Z880 Allergy status to penicillin: Secondary | ICD-10-CM | POA: Insufficient documentation

## 2013-05-21 DIAGNOSIS — R072 Precordial pain: Secondary | ICD-10-CM | POA: Insufficient documentation

## 2013-05-21 DIAGNOSIS — E785 Hyperlipidemia, unspecified: Secondary | ICD-10-CM | POA: Insufficient documentation

## 2013-05-21 DIAGNOSIS — I10 Essential (primary) hypertension: Secondary | ICD-10-CM | POA: Insufficient documentation

## 2013-05-21 DIAGNOSIS — R079 Chest pain, unspecified: Secondary | ICD-10-CM | POA: Diagnosis not present

## 2013-05-21 LAB — COMPREHENSIVE METABOLIC PANEL
Albumin: 3.7 g/dL (ref 3.5–5.2)
Alkaline Phosphatase: 104 U/L (ref 39–117)
BUN: 13 mg/dL (ref 6–23)
CO2: 22 mEq/L (ref 19–32)
Chloride: 105 mEq/L (ref 96–112)
Creatinine, Ser: 0.93 mg/dL (ref 0.50–1.10)
GFR calc non Af Amer: 63 mL/min — ABNORMAL LOW (ref >90)
Potassium: 3.7 mEq/L (ref 3.5–5.1)
Total Bilirubin: 0.4 mg/dL (ref 0.3–1.2)

## 2013-05-21 LAB — CBC WITH DIFFERENTIAL/PLATELET
Basophils Relative: 1 % (ref 0–1)
HCT: 42.2 % (ref 36.0–46.0)
Hemoglobin: 14.7 g/dL (ref 12.0–15.0)
Lymphocytes Relative: 23 % (ref 12–46)
Lymphs Abs: 1.3 10*3/uL (ref 0.7–4.0)
MCHC: 34.8 g/dL (ref 30.0–36.0)
Monocytes Absolute: 0.4 10*3/uL (ref 0.1–1.0)
Monocytes Relative: 7 % (ref 3–12)
Neutro Abs: 3.7 10*3/uL (ref 1.7–7.7)
Neutrophils Relative %: 65 % (ref 43–77)
RBC: 4.91 MIL/uL (ref 3.87–5.11)
WBC: 5.7 10*3/uL (ref 4.0–10.5)

## 2013-05-21 LAB — TROPONIN I: Troponin I: <0.3 ng/mL (ref <0.30)

## 2013-05-21 MED ORDER — GI COCKTAIL ~~LOC~~
30.0000 mL | Freq: Once | ORAL | Status: AC
  Administered 2013-05-21: 30 mL via ORAL
  Filled 2013-05-21: qty 30

## 2013-05-21 MED ORDER — ESOMEPRAZOLE MAGNESIUM 40 MG PO CPDR: 40.0000 mg | DELAYED_RELEASE_CAPSULE | Freq: Every day | ORAL | Status: DC

## 2013-05-21 MED ORDER — FAMOTIDINE IN NACL 20-0.9 MG/50ML-% IV SOLN: 20.0000 mg | Freq: Once | INTRAVENOUS | Status: DC

## 2013-05-21 NOTE — ED Notes (Signed)
MD at bedside. 

## 2013-05-21 NOTE — ED Provider Notes (Signed)
History    CSN: [id number] Arrival date & time 05/21/13  1100  First MD Initiated Contact with Patient 05/21/13 1117     Chief Complaint  Patient presents with  . Chest Pain   (Consider location/radiation/quality/duration/timing/severity/associated sxs/prior Treatment) The history is provided by the patient.  [name] is a 67 y.o. female history of paroxysmal A. fib, hypertension, GERD presenting with chest pain. Substernal chest pain and had a burning sensation since 2 PM yesterday. It is constant and occasionally radiate to the right shoulder and the back. She took some Prevacid and had minimal relief. He does reflux she has a history of reflux. Denies a history of CAD or heart failure. Resting shortness of breath or leg swelling no history of PE. She's not on Coumadin for her paroxysmal A. Fib and had a normal stress test 2 years ago.   Past Medical History  Diagnosis Date  . PAF (paroxysmal atrial fibrillation)     normal stress echo in 2011  . Hypertension   . Dyslipidemia   . GERD (gastroesophageal reflux disease)   . Obesity   . High risk medication use     on Flecainide since January of 2013   Past Surgical History  Procedure Laterality Date  . Wrist surgery      RIGHT WRIST  . Ankle surgery    . Stress echo test  07/25/2010    EF 60%  . Cardiovascular stress test  07/21/2007    EF 80%, NORMAL NONDIAGONISTIC FOR ISCHEMIA  . Abdominal hysterectomy     Family History  Problem Relation Age of Onset  . Alzheimer's disease Mother   . Heart disease Father   . Stroke Sister   . Lung cancer Brother   . Cancer Brother    History  Substance Use Topics  . Smoking status: Current Every Day Smoker  . Smokeless tobacco: Not on file  . Alcohol Use: No   OB History   Grav Para Term Preterm Abortions TAB SAB Ect Mult Living                 Review of Systems  Cardiovascular: Positive for chest pain.  All other systems reviewed and are negative.    Allergies   Penicillins  Home Medications   Current Outpatient Rx  Name  Route  Sig  Dispense  Refill  . aspirin 81 MG tablet   Oral   Take 81 mg by mouth every evening.          . flecainide (TAMBOCOR) 50 MG tablet   Oral   Take 50-100 mg by mouth 2 (two) times daily. Take 100 mg (two tablets) in the morning and 50 mg (one tablet) in the evening         . metoprolol tartrate (LOPRESSOR) 25 MG tablet   Oral   Take 1 tablet (25 mg total) by mouth 2 (two) times daily.   60 tablet   5   . sertraline (ZOLOFT) 50 MG tablet   Oral   Take 25 mg by mouth daily.          Marland Kitchen esomeprazole (NEXIUM) 40 MG capsule   Oral   Take 1 capsule (40 mg total) by mouth daily.   30 capsule   0   . famotidine (PEPCID) 20 MG tablet   Oral   Take 20 mg by mouth once.          BP 138/68  Pulse 52  Temp(Src) 98 F (  36.7 C) (Oral)  Resp 18  Ht 5\' 6"  (1.676 m)  SpO2 98% Physical Exam  Nursing note and vitals reviewed. Constitutional: She is oriented to person, place, and time. She appears well-developed and well-nourished.  Comfortable   HENT:  Head: Normocephalic.  Mouth/Throat: Oropharynx is clear and moist.  Eyes: Conjunctivae are normal. Pupils are equal, round, and reactive to light.  Neck: Normal range of motion. Neck supple.  Cardiovascular: Normal rate, regular rhythm and normal heart sounds.   Pulmonary/Chest: Effort normal and breath sounds normal. No respiratory distress. She has no wheezes. She has no rales.  Not reproducible   Abdominal: Soft. Bowel sounds are normal. She exhibits no distension. There is no tenderness. There is no rebound and no guarding.  Musculoskeletal: Normal range of motion. She exhibits no edema and no tenderness.  Neurological: She is alert and oriented to person, place, and time.  Skin: Skin is warm and dry.  Psychiatric: She has a normal mood and affect. Her behavior is normal. Judgment and thought content normal.    ED Course  Procedures (including  critical care time) Labs Reviewed  COMPREHENSIVE METABOLIC PANEL - Abnormal; Notable for the following:    Glucose, Bld 135 (*)    GFR calc non Af Amer 63 (*)    GFR calc Af Amer 73 (*)    All other components within normal limits  CBC WITH DIFFERENTIAL  TROPONIN I   Dg Chest 2 View  05/21/2013   *RADIOLOGY REPORT*  Clinical Data: Chest pain  CHEST - 2 VIEW  Comparison: 06/25/2011  Findings: Mild left basilar opacity, likely scarring versus atelectasis.  No pleural effusion or pneumothorax.  The heart is normal in size.  Visualized osseous structures are within normal limits.  IMPRESSION: Mild left basilar opacity, likely scarring versus atelectasis.   Original Report Authenticated By: Charline Bills, M.D.   1. Chest pain   2. Reflux     Date: 05/21/2013  Rate: 59  Rhythm: normal sinus rhythm  QRS Axis: normal  Intervals: normal  ST/T Wave abnormalities: normal  Conduction Disutrbances:none  Narrative Interpretation:   Old EKG Reviewed: unchanged      MDM  [name] is a 67 y.o. female here with substernal chest pain. Trop neg x 1. Symptoms for over 24 hrs and she had negative stress test previously. I think symptoms are very atypical for ACS. Likely has GERD. Recommend start on nexium, prn pepcid. Will have her f/u with her GI doctor and cardiologist.    Richardean Canal, MD 05/21/13 [phone]

## 2013-05-21 NOTE — ED Notes (Signed)
Patient states chest pain started yesterday at 1400, "burning sensation" with "sharp pain in right should pain"

## 2013-07-13 ENCOUNTER — Other Ambulatory Visit: Payer: Self-pay

## 2013-07-13 MED ORDER — METOPROLOL TARTRATE 25 MG PO TABS: 25.0000 mg | ORAL_TABLET | Freq: Two times a day (BID) | ORAL | Status: DC

## 2013-07-14 ENCOUNTER — Telehealth: Payer: Self-pay | Admitting: *Deleted

## 2013-07-14 NOTE — Telephone Encounter (Signed)
[facility] called for clarification of sertraline. Please call back at [phone] reference# [phone]. Thank you

## 2013-07-22 NOTE — Telephone Encounter (Signed)
Returned call to [facility] will need to cal PCP Dr.Stoneking for new sertraline prescription.

## 2013-08-09 DIAGNOSIS — J069 Acute upper respiratory infection, unspecified: Secondary | ICD-10-CM | POA: Diagnosis not present

## 2013-10-28 ENCOUNTER — Other Ambulatory Visit: Payer: Self-pay | Admitting: Cardiology

## 2013-11-03 ENCOUNTER — Encounter: Payer: Self-pay | Admitting: Cardiology

## 2013-11-08 ENCOUNTER — Ambulatory Visit (INDEPENDENT_AMBULATORY_CARE_PROVIDER_SITE_OTHER): Payer: Medicare Other | Admitting: Nurse Practitioner

## 2013-11-08 ENCOUNTER — Encounter: Payer: Self-pay | Admitting: Nurse Practitioner

## 2013-11-08 VITALS — BP 120/76 | HR 57 | Ht 66.0 in | Wt 208.8 lb

## 2013-11-08 DIAGNOSIS — I4891 Unspecified atrial fibrillation: Secondary | ICD-10-CM

## 2013-11-08 DIAGNOSIS — I480 Paroxysmal atrial fibrillation: Secondary | ICD-10-CM

## 2013-11-08 MED ORDER — ESOMEPRAZOLE MAGNESIUM 40 MG PO CPDR: 40.0000 mg | DELAYED_RELEASE_CAPSULE | Freq: Every day | ORAL | Status: DC

## 2013-11-08 MED ORDER — FLECAINIDE ACETATE 50 MG PO TABS: ORAL_TABLET | ORAL | Status: DC

## 2013-11-08 MED ORDER — METOPROLOL TARTRATE 25 MG PO TABS: 25.0000 mg | ORAL_TABLET | Freq: Two times a day (BID) | ORAL | Status: DC

## 2013-11-08 NOTE — Patient Instructions (Addendum)
Stay on your current medicines - I did refill your medicine tody  See Dr. Swaziland in 1 year  Stay active  Call the [facility] office at [phone] if you have any questions, problems or concerns.

## 2013-11-08 NOTE — Progress Notes (Signed)
[name] Date of Birth: 24-Oct-1946 Medical Record #[id number]  History of Present Illness: [name] is seen back today for a follow up visit. Seen for Dr. [place]. She has a history of PAF, obesity, prior tobacco abuse, HTN and HLD.   Last seen in August of 2013 - had said she has stopped smoking. Was in the ER back in July with chest pain - felt to be GERD - was to have seen cardiology and GI. No visit here noted.   Comes in today. Here alone. Doing ok. Needs her medicines refilled. Back smoking. No chest pain. Rhythm has been good. Had one spell last week when she was late taking her Flecainide that she had a little "blip" very short lived. Overall, she feels she is doing well. Labs are done by Dr. Pete Glatter.  Current Outpatient Prescriptions  Medication Sig Dispense Refill  . aspirin 81 MG tablet Take 81 mg by mouth every evening.       Marland Kitchen esomeprazole (NEXIUM) 40 MG capsule Take 1 capsule (40 mg total) by mouth daily.  30 capsule  3  . flecainide (TAMBOCOR) 50 MG tablet TAKE 100 MG IN THE AM AND 50 MG IN THE EVENING  270 tablet  6  . metoprolol tartrate (LOPRESSOR) 25 MG tablet Take 1 tablet (25 mg total) by mouth 2 (two) times daily.  180 tablet  3  . sertraline (ZOLOFT) 50 MG tablet Take 25 mg by mouth daily.        No current facility-administered medications for this visit.    Allergies  Allergen Reactions  . Penicillins     Past Medical History  Diagnosis Date  . PAF (paroxysmal atrial fibrillation)     normal stress echo in 2011  . Hypertension   . Dyslipidemia   . GERD (gastroesophageal reflux disease)   . Obesity   . High risk medication use     on Flecainide since January of 2013    Past Surgical History  Procedure Laterality Date  . Wrist surgery      RIGHT WRIST  . Ankle surgery    . Stress echo test  07/25/2010    EF 60%  . Cardiovascular stress test  07/21/2007    EF 80%, NORMAL NONDIAGONISTIC FOR ISCHEMIA  . Abdominal hysterectomy       History  Smoking status  . Current Every Day Smoker  Smokeless tobacco  . Not on file    History  Alcohol Use No    Family History  Problem Relation Age of Onset  . Alzheimer's disease Mother   . Heart disease Father   . Stroke Sister   . Lung cancer Brother   . Cancer Brother     Review of Systems: The review of systems is per the HPI.  All other systems were reviewed and are negative.  Physical Exam: BP 120/76  Pulse 57  Ht 5\' 6"  (1.676 m)  Wt 208 lb 12.8 oz (94.711 kg)  BMI 33.72 kg/m2  SpO2 97% Patient is very pleasant and in no acute distress. Skin is warm and dry. Color is normal.  HEENT is unremarkable. Normocephalic/atraumatic. PERRL. Sclera are nonicteric. Neck is supple. No masses. No JVD. Lungs are clear. Cardiac exam shows a regular rate and rhythm. Abdomen is soft. Extremities are without edema. Gait and ROM are intact. No gross neurologic deficits noted.  LABORATORY DATA: EKG today shows sinus bradycardia.   Lab Results  Component Value Date   WBC 5.7 05/21/2013  HGB 14.7 05/21/2013   HCT 42.2 05/21/2013   PLT 193 05/21/2013   GLUCOSE 135* 05/21/2013   ALT 19 05/21/2013   AST 15 05/21/2013   NA 139 05/21/2013   K 3.7 05/21/2013   CL 105 05/21/2013   CREATININE 0.93 05/21/2013   BUN 13 05/21/2013   CO2 22 05/21/2013   TSH 2.66 04/02/2012   INR 0.97 06/25/2011     Assessment / Plan:  1. PAF - maintaining sinus on Flecainide - refilled today along with her metoprolol.   2. Obesity - exercise/diet encouraged for weight loss.   3. HTN - BP looks good. No change in therapy.   4. Tobacco abuse - cessation encouraged.   5. Chest pain - felt to be GI from back in summer - no recurrence.  Tentatively see her back in a year.   Patient is agreeable to this plan and will call if any problems develop in the interim.   Rosalio Macadamia, RN, ANP-C [facility] [address] [place], [place]  [address]

## 2013-12-09 DIAGNOSIS — G5600 Carpal tunnel syndrome, unspecified upper limb: Secondary | ICD-10-CM | POA: Diagnosis not present

## 2013-12-09 DIAGNOSIS — K602 Anal fissure, unspecified: Secondary | ICD-10-CM | POA: Diagnosis not present

## 2014-02-16 DIAGNOSIS — J069 Acute upper respiratory infection, unspecified: Secondary | ICD-10-CM | POA: Diagnosis not present

## 2014-04-07 DIAGNOSIS — Z79899 Other long term (current) drug therapy: Secondary | ICD-10-CM | POA: Diagnosis not present

## 2014-04-07 DIAGNOSIS — F329 Major depressive disorder, single episode, unspecified: Secondary | ICD-10-CM | POA: Diagnosis not present

## 2014-04-07 DIAGNOSIS — Z136 Encounter for screening for cardiovascular disorders: Secondary | ICD-10-CM | POA: Diagnosis not present

## 2014-04-07 DIAGNOSIS — I4891 Unspecified atrial fibrillation: Secondary | ICD-10-CM | POA: Diagnosis not present

## 2014-04-07 DIAGNOSIS — F17200 Nicotine dependence, unspecified, uncomplicated: Secondary | ICD-10-CM | POA: Diagnosis not present

## 2014-04-07 DIAGNOSIS — Z0000 Encounter for general adult medical examination without abnormal findings: Secondary | ICD-10-CM | POA: Diagnosis not present

## 2014-04-07 DIAGNOSIS — F3289 Other specified depressive episodes: Secondary | ICD-10-CM | POA: Diagnosis not present

## 2014-04-07 DIAGNOSIS — Z23 Encounter for immunization: Secondary | ICD-10-CM | POA: Diagnosis not present

## 2014-07-17 ENCOUNTER — Encounter: Payer: Self-pay | Admitting: Internal Medicine

## 2014-09-07 ENCOUNTER — Encounter (HOSPITAL_COMMUNITY): Payer: Self-pay | Admitting: Emergency Medicine

## 2014-09-07 ENCOUNTER — Emergency Department (HOSPITAL_COMMUNITY): Payer: Medicare Other

## 2014-09-07 ENCOUNTER — Emergency Department (HOSPITAL_COMMUNITY)
Admission: EM | Admit: 2014-09-07 | Discharge: 2014-09-07 | Disposition: A | Discharge status: Home / Self Care | Payer: Medicare Other | Attending: Emergency Medicine | Admitting: Emergency Medicine

## 2014-09-07 DIAGNOSIS — K8020 Calculus of gallbladder without cholecystitis without obstruction: Secondary | ICD-10-CM | POA: Insufficient documentation

## 2014-09-07 DIAGNOSIS — I480 Paroxysmal atrial fibrillation: Secondary | ICD-10-CM | POA: Insufficient documentation

## 2014-09-07 DIAGNOSIS — K219 Gastro-esophageal reflux disease without esophagitis: Secondary | ICD-10-CM | POA: Insufficient documentation

## 2014-09-07 DIAGNOSIS — R079 Chest pain, unspecified: Secondary | ICD-10-CM | POA: Diagnosis not present

## 2014-09-07 DIAGNOSIS — Z7982 Long term (current) use of aspirin: Secondary | ICD-10-CM | POA: Insufficient documentation

## 2014-09-07 DIAGNOSIS — R110 Nausea: Secondary | ICD-10-CM | POA: Diagnosis not present

## 2014-09-07 DIAGNOSIS — Z720 Tobacco use: Secondary | ICD-10-CM | POA: Diagnosis not present

## 2014-09-07 DIAGNOSIS — E669 Obesity, unspecified: Secondary | ICD-10-CM | POA: Diagnosis not present

## 2014-09-07 DIAGNOSIS — Z79899 Other long term (current) drug therapy: Secondary | ICD-10-CM | POA: Diagnosis not present

## 2014-09-07 DIAGNOSIS — I10 Essential (primary) hypertension: Secondary | ICD-10-CM | POA: Diagnosis not present

## 2014-09-07 DIAGNOSIS — R1013 Epigastric pain: Secondary | ICD-10-CM | POA: Diagnosis not present

## 2014-09-07 LAB — CBC WITH DIFFERENTIAL/PLATELET
Basophils Absolute: 0.1 K/uL (ref 0.0–0.1)
Basophils Relative: 1 % (ref 0–1)
Eosinophils Absolute: 0.3 K/uL (ref 0.0–0.7)
Eosinophils Relative: 4 % (ref 0–5)
HCT: 43.6 % (ref 36.0–46.0)
Hemoglobin: 14.9 g/dL (ref 12.0–15.0)
Lymphocytes Relative: 24 % (ref 12–46)
Lymphs Abs: 1.9 K/uL (ref 0.7–4.0)
MCH: 30.1 pg (ref 26.0–34.0)
MCHC: 34.2 g/dL (ref 30.0–36.0)
MCV: 88.1 fL (ref 78.0–100.0)
Monocytes Absolute: 0.8 K/uL (ref 0.1–1.0)
Monocytes Relative: 10 % (ref 3–12)
Neutro Abs: 4.7 K/uL (ref 1.7–7.7)
Neutrophils Relative %: 61 % (ref 43–77)
Platelets: 204 K/uL (ref 150–400)
RBC: 4.95 MIL/uL (ref 3.87–5.11)
RDW: 13.3 % (ref 11.5–15.5)
WBC: 7.8 K/uL (ref 4.0–10.5)

## 2014-09-07 LAB — HEPATIC FUNCTION PANEL
ALBUMIN: 4 g/dL (ref 3.5–5.2)
ALK PHOS: 138 U/L — AB (ref 39–117)
ALT: 21 U/L (ref 0–35)
AST: 16 U/L (ref 0–37)
Bilirubin, Direct: <0.2 mg/dL (ref 0.0–0.3)
TOTAL PROTEIN: 7.3 g/dL (ref 6.0–8.3)
Total Bilirubin: 0.3 mg/dL (ref 0.3–1.2)

## 2014-09-07 LAB — BASIC METABOLIC PANEL
Anion gap: 11 (ref 5–15)
BUN: 17 mg/dL (ref 6–23)
CHLORIDE: 103 meq/L (ref 96–112)
CO2: 25 meq/L (ref 19–32)
Calcium: 10.3 mg/dL (ref 8.4–10.5)
Creatinine, Ser: 1.07 mg/dL (ref 0.50–1.10)
GFR calc Af Amer: 61 mL/min — ABNORMAL LOW (ref >90)
GFR calc non Af Amer: 53 mL/min — ABNORMAL LOW (ref >90)
GLUCOSE: 116 mg/dL — AB (ref 70–99)
POTASSIUM: 4.4 meq/L (ref 3.7–5.3)
Sodium: 139 mEq/L (ref 137–147)

## 2014-09-07 LAB — LIPASE, BLOOD: Lipase: 36 U/L (ref 11–59)

## 2014-09-07 LAB — TROPONIN I: Troponin I: <0.3 ng/mL

## 2014-09-07 MED ORDER — HYDROCODONE-ACETAMINOPHEN 5-325 MG PO TABS
1.0000 | ORAL_TABLET | ORAL | Status: DC | PRN
Start: 2014-09-07 — End: 2018-08-16

## 2014-09-07 MED ORDER — GI COCKTAIL ~~LOC~~
30.0000 mL | Freq: Once | ORAL | Status: AC
  Administered 2014-09-07: 30 mL via ORAL
  Filled 2014-09-07: qty 30

## 2014-09-07 MED ORDER — ONDANSETRON 8 MG PO TBDP: 8.0000 mg | ORAL_TABLET | Freq: Three times a day (TID) | ORAL | Status: DC | PRN

## 2014-09-07 MED ORDER — ONDANSETRON HCL 4 MG/2ML IJ SOLN
4.0000 mg | Freq: Once | INTRAMUSCULAR | Status: AC
  Administered 2014-09-07: 4 mg via INTRAVENOUS
  Filled 2014-09-07: qty 2

## 2014-09-07 MED ORDER — MORPHINE SULFATE 4 MG/ML IJ SOLN: 4.0000 mg | Freq: Once | INTRAMUSCULAR | Status: DC

## 2014-09-07 MED ORDER — FENTANYL CITRATE 0.05 MG/ML IJ SOLN
50.0000 ug | Freq: Once | INTRAMUSCULAR | Status: AC
  Administered 2014-09-07: 50 ug via INTRAVENOUS
  Filled 2014-09-07: qty 2

## 2014-09-07 NOTE — ED Notes (Signed)
Pt taken to US

## 2014-09-07 NOTE — Discharge Instructions (Signed)
Gallbladder  Take medications as prescribed.  Call the surgery clinic later today to schedule a follow up visit for your gallstones.  Return to the ER for worsening pain, vomiting despite medications, fever, or other new concerning symptoms.   PAIN ACETAMINOPHEN OXYCODONE  PAIN ACETAMINOPHEN OXYCODONE: You have been given a medication that contains acetaminophen and oxycodone.      This medication is used to relieve pain.     DO NOT take this medication if you have liver disease or drink alcohol on a daily basis.     DO NOT take this medication if you are taking other over-the-counter medications that contain Tylenol or acetaminophen (the active ingredient in Tylenol).     If you have side-effects that you think are caused by this medicine, tell your doctor.     DO NOT drink alcoholic beverages while taking this medicine.     If you become dizzy, sit or lie down at the first signs.  You should be careful going up and down stairs.     If you are pregnant or breastfeeding, notify your doctor before taking this medication.     Keep this medication out of the reach of children.  Always keep this medication in child-proof containers.  DO NOT give your medication to anyone else. This medication can be HABIT-FORMING.  Discontinue use when no longer needed and never give this medication to others.  You have been given a medication, or a prescription for a medication, that causes drowsiness or dizziness.  DO NOT drive a car, operate machinery, or perform jobs that require you to be alert until you know how you are going to react to this medicine.  THESE INSTRUCTIONS ARE NOT COMPREHENSIVE (complete):  Ask your pharmacist for additional information and precautions for this medication.   GI ANTIEMETIC  GI ANTIEMETIC: You have been given a prescription for a medication for nausea and vomiting.      It is OK to take this medication if you are pregnant.  Be sure to tell your regular doctor or  obstetrician [facility] doctor) that you have been taking this medication.     Take this medication as directed.     If you are taking phenobarbital, narcotic pain medications, antidepressants, or sleeping pills your dosage may need to be adjusted.  Be sure to inform your doctor of all the other medications that you are taking.     DO NOT take this medication if you have liver disease or heart disease.     DO NOT take pain killers (narcotic medication) unless specifically instructed to do so by your doctor     DO NOT drink alcoholic beverages while taking this medicine.     If you develop any reactions that you believe may be from the medication be sure to tell your doctor or return to the ER (Some reactions may include:  dizziness, shaking, visual disturbances, nervousness, fainting, rash).     If you become dizzy, sit or lie down at the first signs.  You should be careful going up and down stairs.     Keep this medication out of the reach of children.  Always keep this medication in child-proof containers.  DO NOT give your medication to anyone else. You have been given a medication, or a prescription for a medication, that causes drowsiness or dizziness.  DO NOT drive a car, operate machinery, ride a bike, or perform jobs that require you to be alert until you know how you  are going to react to this medication.  THESE INSTRUCTIONS ARE NOT COMPREHENSIVE (complete):  Ask your pharmacist for additional information and precautions for this medication.   LOW FAT DIET  Low Fat Diet  Your doctor wants you to be on a low fat diet.  This diet will be helpful if you want to lose weight or if you have problems with your liver, pancreas, gallbladder.  FOODS ALLOWED:    Beverages: All, except those not allowed.   Evaporated skim milk.     Breads: All enriched or whole grain bread, bread sticks, graham crackers, melba toast, pretzels, rye wafers, matzoh, saltines, bagels.     Cereals: All cooked  without fat or dry.     Desserts: All fruit, diet puddings, gelatin, dessert made with egg white, angel food cake, fruit ice, sherbet.     Eggs: Not more than one egg yolk daily, whites okay.  Cholesterol free egg substitutes, such as "egg beaters."     Fat: One teaspoon each meal or 3 teaspoons per day; butter, mayonnaise, margarine, oil. (May be used in cooking if omitted at meals) Fat free salad dressings and gravy.     Fruits: All fresh, frozen, or canned fruit or fruit juice. One citrus fruit every day.     Meats, Fish, Poultry & Cheese: Remove visible fat from meat before cooking. Baked, broiled, boiled, roasted, stewed, simmered; lean fish, meat, poultry, seafood.  Water packed salmon and tuna.  Lowfat cottage cheese, skim milk cheese, ricotta, parmesan, farmer's cheese, lowfat yogurt and tofu.     Potatoes & Substitutes: Macaroni, noodles, rice, spaghetti, sweet or white potato; prepared without fat, unless used in amount allowed.     Soups: Bouillon, cream soups made with vegetables and skim milk, fat free meat and poultry soups.     Sweets: Honey, jam, jelly, marshmallows, molasses, sugar, syrup, candies; hard, Life Savers, gum drops, jelly beans, sour balls.     Vegetables: All fresh, frozen, canned or juiced vegetables allowed.  FOODS NOT ALLOWED:    Beverages: Cream, 2% or whole milk, chocolate milk, condensed milk, evaporated or malted milk or shakes, 1/2 & 1/2.     Breads: Quick breads, muffins, biscuits, pancakes, corn bread, sweet rolls, any fried breads.     Cereals: Bran, if it causes distress, wheat germ.     Desserts: Dessert made with whole milk, cream, butter, lard, oil, coconut, nuts, or chocolate.     Eggs: Eggs prepared with whole milk or fat. Fried eggs.     Fat: More than 1 teaspoon per meal.  Bacon, bacon fat, ham fat, lard, salt pork, shortening, gravy, salad dressings, non-dairy creamers.     Fruits: [place].  Any fruit that causes distress.      Meats, Fish, Estate manager/land agent: All fried or fatty.  Sausage, prime rib, frankfurters, luncheon meats, fish canned in oil, duck, goose, poultry skin, spiced or pickled meats, cheese (except those allowed), whole milk yogurt.  Peanut butter limited to 1 Tbs. day.     Potatoes & Substitutes: Cooked with fat or oil, fried potatoes, potato chips, cream sauces (unless made with skim milk).     Sweets: Chocolate, coconut, nuts, caramels.                Vegetables: Avocado, any cooked in fat or that cause distress.    BILIARY COLIC  BILIARY COLIC: You have been diagnosed with biliary colic.  Biliary colic is the term used to describe crampy pain from  a gallbladder that contains gallstones.  The gallbladder is a small sack that hangs from the liver. It stores a liquid called bile. Bile is produced by the liver. When you eat, the gallbladder squeezes bile through a duct or tube into the intestines to help with the digestion of fat.  Gallstones develop from crystals of bile. The stone may be smaller than a pea, or as large as a golf ball. There may be one or several stones. The stone may block the tube that drains the bile from the gallbladder. This blockage causes spasm of the gallbladder and pain.  The pain usually comes and goes and is crampy.  It often starts after eating foods that contain a lot of fat.  You may also have nausea and vomiting with the pain.  Biliary colic is usually treated with pain medications. You may also be given a medication for the nausea and vomiting.  You should avoid eating foods that are fried or contain a lot of fat.  If you have more episodes of pain, you may need to have your gall bladder removed.      You should contact your family doctor for a referral to a general surgeon. YOU SHOULD SEEK MEDICAL ATTENTION IMMEDIATELY, EITHER HERE OR AT THE NEAREST EMERGENCY DEPARTMENT, IF ANY OF THE FOLLOWING OCCURS:      Increasing pain or pain that does not go  away.     Persistent vomiting or if you are not able to keep any fluids down.     Fever or shaking chills.     Yellowing of your skin or eyes, or dark, brown-colored urine.  If you develop symptoms of Shortness of Breath, Chest Pain, Swelling of lips, mouth or tongue or if your condition becomes worse with any new symptoms, see your doctor or return to the Emergency Department for immediate care. Emergency services are not intended to be a substitute for comprehensive medical attention.  Please contact your doctor for follow up if not improving as expected.   Call your doctor in 5-7 days or as directed if there is no improvement.   Community Resources: *IF YOU ARE IN IMMEDIATE DANGER CALL 911!  Abuse/Neglect:  Family Services Crisis Hotline [facility]): (769)419-1234 [facility] Against Violence [facility]): [phone]  After hours, holidays and weekends: [phone]) 798-1139 National Domestic Violence Hotline: [place]: [place]: Dannielle Karvonen: 228-813-4991  Health Clinics:  Urgent [place] ([place]): [phone] Monday - Friday 8 AM - 9 PM, Saturday and Sunday 10 AM - [place]: [phone] Monday - Friday 8 AM - 5 PM  [place]: ([phone] Monday- Friday 8:30 AM - 5:30 PM, Sat 9 AM - 1 PM  [place] on [address]: [phone] [facility]: [phone] [facility] on [address]: [phone]  [place]: [address] [phone]  Cultures: If culture results are positive, we will notify you if a change in treatment is necessary.  LABORATORY TESTS:         If you had any labs drawn in the ED that have not resulted by the time you are discharged home, we will review these lab results and the treatment given to you.  If there is any further treatment or notification needed,  we will contact you by phone, or letter.  "PLEASE  ENSURE THAT YOU HAVE GIVEN [place] YOUR CURRENT WORKING PHONE NUMBER AND YOUR CURRENT ADDRESS, so that we can contact you if needed."  RADIOLOGY TESTS:  If the referred physician wants todays x-rays, please call the hospitals Radiology Department the day before your doctors appointment. [facility]     [phone] [address]   [phone] [place]     [phone]  Our doctors and staff appreciate your choosing [place] for your emergency medical care needs. We are here to serve you.    Biliary Colic  Biliary colic is a steady or irregular pain in the upper abdomen. It is usually under the right side of the rib cage. It happens when gallstones interfere with the normal flow of bile from the gallbladder. Bile is a liquid that helps to digest fats. Bile is made in the liver and stored in the gallbladder. When you eat a meal, bile passes from the gallbladder through the cystic duct and the common bile duct into the small intestine. There, it mixes with partially digested food. If a gallstone blocks either of these ducts, the normal flow of bile is blocked. The muscle cells in the bile duct contract forcefully to try to move the stone. This causes the pain of biliary colic.  SYMPTOMS   A person with biliary colic usually complains of pain in the upper abdomen. This pain can be:  In the center of the upper abdomen just below the breastbone.  In the upper-right part of the abdomen, near the gallbladder and liver.  Spread back toward the right shoulder blade.  Nausea and vomiting.  The pain usually occurs after eating.  Biliary colic is usually triggered by the digestive system's demand for bile. The demand for bile is high after fatty meals. Symptoms can also occur when a person who has been fasting suddenly eats a very large meal. Most episodes of biliary colic pass after 1 to 5 hours. After the most intense pain passes, your abdomen may continue to ache  mildly for about 24 hours. DIAGNOSIS  After you describe your symptoms, your caregiver will perform a physical exam. He or she will pay attention to the upper right portion of your belly (abdomen). This is the area of your liver and gallbladder. An ultrasound will help your caregiver look for gallstones. Specialized scans of the gallbladder may also be done. Blood tests may be done, especially if you have fever or if your pain persists. PREVENTION  Biliary colic can be prevented by controlling the risk factors for gallstones. Some of these risk factors, such as heredity, increasing age, and pregnancy are a normal part of life. Obesity and a high-fat diet are risk factors you can change through a healthy lifestyle. Women going through menopause who take hormone replacement therapy (estrogen) are also more likely to develop biliary colic. TREATMENT   Pain medication may be prescribed.  You may be encouraged to eat a fat-free diet.  If the first episode of biliary colic is severe, or episodes of colic keep retuning, surgery to remove the gallbladder (cholecystectomy) is usually recommended. This procedure can be done through small incisions using an instrument called a laparoscope. The procedure often requires a brief stay in the hospital. Some people can leave the hospital the same day. It is the most widely used treatment in people troubled by painful gallstones. It is effective and safe, with no complications in more than 90% of cases.  If surgery cannot be done, medication that dissolves gallstones may be  used. This medication is expensive and can take months or years to work. Only small stones will dissolve.  Rarely, medication to dissolve gallstones is combined with a procedure called shock-wave lithotripsy. This procedure uses carefully aimed shock waves to break up gallstones. In many people treated with this procedure, gallstones form again within a few years. PROGNOSIS  If gallstones block  your cystic duct or common bile duct, you are at risk for repeated episodes of biliary colic. There is also a 25% chance that you will develop a gallbladder infection(acute cholecystitis), or some other complication of gallstones within 10 to 20 years. If you have surgery, schedule it at a time that is convenient for you and at a time when you are not sick. HOME CARE INSTRUCTIONS   Drink plenty of clear fluids.  Avoid fatty, greasy or fried foods, or any foods that make your pain worse.  Take medications as directed. SEEK MEDICAL CARE IF:   You develop a fever over 100.5 F (38.1 C).  Your pain gets worse over time.  You develop nausea that prevents you from eating and drinking.  You develop vomiting. SEEK IMMEDIATE MEDICAL CARE IF:   You have continuous or severe belly (abdominal) pain which is not relieved with medications.  You develop nausea and vomiting which is not relieved with medications.  You have symptoms of biliary colic and you suddenly develop a fever and shaking chills. This may signal cholecystitis. Call your caregiver immediately.  You develop a yellow color to your skin or the white part of your eyes (jaundice). Document Released: 04/06/2006 Document Revised: 01/26/2012 Document Reviewed: 06/15/2008 [facility] Patient Information 2015 [place], [place]. This information is not intended to replace advice given to you by your health care provider. Make sure you discuss any questions you have with your health care provider.

## 2014-09-07 NOTE — ED Notes (Signed)
Pt back from [place]

## 2014-09-07 NOTE — ED Notes (Signed)
Pt arrives to ED c/o mid CP that radiated to shoulder blades starting at 2100. States she took 81mg  ASA. States she has a hx of afib.

## 2014-09-07 NOTE — ED Provider Notes (Signed)
CSN: [id number]     Arrival date & time 09/07/14  0001 History   First MD Initiated Contact with Patient 09/07/14 0110     Chief Complaint  Patient presents with  . Chest Pain     (Consider location/radiation/quality/duration/timing/severity/associated sxs/prior Treatment) HPI 68-year-old female presents to home with complaint of chest pain and pain in between her shoulder breath started around 9 PM tonight.  Pain has been sharp and unrelenting.  She denies any shortness of breath or diaphoresis.  She does reports nausea with the pain.  Patient took extra Tagamet as she thought symptoms were due to reflux.  She does report the pain is slightly burning in nature.  Patient reports over the last few days to weeks she has noted pain after eating, especially with foods like bacon.  She reports on Monday she had nausea and vomiting after having a bacon and egg sandwich.  No prior history of gallstones that she knows of.  She has never seen a gastroenterologist or had an upper endoscopy.  She has a history of paroxysmal A. fib and hypertension.  No prior history of coronary artery disease.  She denies any palpitations Past Medical History  Diagnosis Date  . PAF (paroxysmal atrial fibrillation)     normal stress echo in 2011  . Hypertension   . Dyslipidemia   . GERD (gastroesophageal reflux disease)   . Obesity   . High risk medication use     on Flecainide since January of 2013   Past Surgical History  Procedure Laterality Date  . Wrist surgery      RIGHT WRIST  . Ankle surgery    . Stress echo test  07/25/2010    EF 60%  . Cardiovascular stress test  07/21/2007    EF 80%, NORMAL NONDIAGONISTIC FOR ISCHEMIA  . Abdominal hysterectomy     Family History  Problem Relation Age of Onset  . Alzheimer's disease Mother   . Heart disease Father   . Stroke Sister   . Lung cancer Brother   . Cancer Brother    History  Substance Use Topics  . Smoking status: Current Every Day Smoker  .  Smokeless tobacco: Not on file  . Alcohol Use: No   OB History   Grav Para Term Preterm Abortions TAB SAB Ect Mult Living                 Review of Systems  See History of Present Illness; otherwise all other systems are reviewed and negative  Home Medications   Prior to Admission medications   Medication Sig Start Date End Date Taking? Authorizing Provider  acetaminophen (TYLENOL) 500 MG tablet Take 500 mg by mouth every 6 (six) hours as needed for mild pain.   Yes Historical Provider, MD  aspirin 81 MG tablet Take 81 mg by mouth every evening.    Yes Historical Provider, MD  cimetidine (TAGAMET) 400 MG tablet Take 400 mg by mouth 2 (two) times daily.   Yes Historical Provider, MD  flecainide (TAMBOCOR) 50 MG tablet TAKE 100 MG IN THE AM AND 50 MG IN THE EVENING 11/08/13  Yes Burtis Junes, NP  metoprolol tartrate (LOPRESSOR) 25 MG tablet Take 1 tablet (25 mg total) by mouth 2 (two) times daily. 11/08/13  Yes Burtis Junes, NP  nitroGLYCERIN (NITROGLYN) 2 % ointment Apply 0.5 inches topically daily as needed for chest pain.   Yes Historical Provider, MD  sertraline (ZOLOFT) 50 MG tablet Take 25 mg  by mouth daily.    Yes Historical Provider, MD   BP 120/57  Pulse 61  Temp(Src) 97.5 F (36.4 C) (Oral)  Resp 17  Ht 5\' 6"  (1.676 m)  Wt 190 lb (86.183 kg)  BMI 30.68 kg/m2  SpO2 94% Physical Exam  Nursing note and vitals reviewed. Constitutional: She is oriented to person, place, and time. She appears well-developed and well-nourished. She appears distressed (uncomfortable appearing).  HENT:  Head: Normocephalic and atraumatic.  Nose: Nose normal.  Mouth/Throat: Oropharynx is clear and moist.  Eyes: Conjunctivae and EOM are normal. Pupils are equal, round, and reactive to light.  Neck: Normal range of motion. Neck supple. No JVD present. No tracheal deviation present. No thyromegaly present.  Cardiovascular: Normal rate, regular rhythm, normal heart sounds and intact distal  pulses.  Exam reveals no gallop and no friction rub.   No murmur heard. Pulmonary/Chest: Effort normal and breath sounds normal. No stridor. No respiratory distress. She has no wheezes. She has no rales. She exhibits no tenderness.  Abdominal: Soft. Bowel sounds are normal. She exhibits no distension and no mass. There is tenderness (epigastric tenderness with palpation). There is no rebound and no guarding.  Musculoskeletal: Normal range of motion. She exhibits no edema and no tenderness.  Lymphadenopathy:    She has no cervical adenopathy.  Neurological: She is alert and oriented to person, place, and time. She displays normal reflexes. She exhibits normal muscle tone. Coordination normal.  Skin: Skin is warm and dry. No rash noted. No erythema. No pallor.  Psychiatric: She has a normal mood and affect. Her behavior is normal. Judgment and thought content normal.    ED Course  Procedures (including critical care time) Labs Review Labs Reviewed  BASIC METABOLIC PANEL - Abnormal; Notable for the following:    Glucose, Bld 116 (*)    GFR calc non Af Amer 53 (*)    GFR calc Af Amer 61 (*)    All other components within normal limits  HEPATIC FUNCTION PANEL - Abnormal; Notable for the following:    Alkaline Phosphatase 138 (*)    All other components within normal limits  CBC WITH DIFFERENTIAL  TROPONIN I  LIPASE, BLOOD    Imaging Review Dg Chest 2 View  09/07/2014   CLINICAL DATA:  Centralized chest pain. Pain in the right jaw. Pain between the shoulder blades starting at 9 p.m. tonight.  EXAM: CHEST  2 VIEW  COMPARISON:  05/21/2013  FINDINGS: Fibrosis or atelectasis again demonstrated in the left lung base. Normal heart size and pulmonary vascularity. No focal airspace disease or consolidation. No blunting of costophrenic angles. No pneumothorax.  IMPRESSION: Fibrosis or atelectasis in the left lung base similar prior study.   Electronically Signed   By: Lucienne Capers M.D.   On:  09/07/2014 00:50   US Abdomen Limited  09/07/2014   CLINICAL DATA:  Chest pain.  EXAM: US ABDOMEN LIMITED - RIGHT UPPER QUADRANT  COMPARISON:  None.  FINDINGS: Gallbladder:  Limited visualization. Small calcifications are demonstrated in the inferior gallbladder suggesting small stones. No gallbladder wall thickening. Murphy's sign is negative.  Common bile duct:  Diameter: 3 mm, normal  Liver:  Mild diffuse increased parenchymal echotexture suggesting fatty infiltration. No focal lesions demonstrated.  IMPRESSION: Probable small stones in the gallbladder without wall thickening. Fatty infiltration of the liver appear   Electronically Signed   By: Lucienne Capers M.D.   On: 09/07/2014 03:59     EKG Interpretation  Date/Time:  Thursday September 07 2014 00:16:14 EDT Ventricular Rate:  66 PR Interval:  188 QRS Duration: 90 QT Interval:  404 QTC Calculation: 423 R Axis:   23 Text Interpretation:  Normal sinus rhythm Low voltage QRS Borderline ECG  Confirmed by Ceirra Belli  MD, Meg Niemeier ([id number]) on 09/07/2014 1:01:52 AM      MDM   Final diagnoses:  Calculus of gallbladder without cholecystitis without obstruction   68 year old female with chest pain and subscapular pain, which seems to be happening after meals.  Concern for cholecystitis or cholelithiasis.  Differential also includes dissection, esophagitis, peptic ulcer disease.  Patient initially felt better after GI cocktail and fentanyl, but ha had some return of pain.  Patient currently pain free.  EKG without ischemia, troponin negative after 3 hours of continuous pain.  No widening of mediastinum, no ripping or tearing pain or other history consistent with dissection.  Ultrasound with gallstones.  Will have her followup with surgery for further evaluation and possible cholecystectomy.    Kalman Drape, MD 09/07/14 [phone]

## 2014-09-18 DIAGNOSIS — K8020 Calculus of gallbladder without cholecystitis without obstruction: Secondary | ICD-10-CM | POA: Diagnosis not present

## 2014-09-25 ENCOUNTER — Telehealth: Payer: Self-pay

## 2014-09-25 ENCOUNTER — Ambulatory Visit (INDEPENDENT_AMBULATORY_CARE_PROVIDER_SITE_OTHER): Payer: Medicare Other | Admitting: Nurse Practitioner

## 2014-09-25 ENCOUNTER — Encounter: Payer: Self-pay | Admitting: Nurse Practitioner

## 2014-09-25 VITALS — BP 110/80 | HR 57 | Ht 66.0 in | Wt 207.4 lb

## 2014-09-25 DIAGNOSIS — I480 Paroxysmal atrial fibrillation: Secondary | ICD-10-CM

## 2014-09-25 NOTE — Patient Instructions (Signed)
Stay on your current medicines  I will send a note to the surgeon  See [name] in July of 2016  Smoking cessation is strongly encouraged!!  Call the [place] office at [phone] if you have any questions, problems or concerns.

## 2014-09-25 NOTE — Progress Notes (Signed)
[name] Date of Birth: 1945-12-27 Medical Record #[id number]  History of Present Illness: [name] is seen back today for a work in visit. Seen for [name]. She has a history of PAF, obesity, prior tobacco abuse, HTN and HLD. She does not have any known CAD. Normal stress echo in 2013.   Last seen in December of 2014. Needed her medicines refilled.  She was doing ok from a cardiac standpoint - had returned to smoking.   To the ER about 2 weeks ago - having chest pain and pain between her shoulder blades - found to have gallbladder disease.   Comes in today. Here alone. Doing ok.  Labs are done by Dr. Felipa Eth. She is still smoking. Had another "attack" this past Friday. Anxious to get her gallbladder out. No chest pain. Not short of breath. Remains active. No syncope noted.   Current Outpatient Prescriptions  Medication Sig Dispense Refill  . acetaminophen (TYLENOL) 500 MG tablet Take 500 mg by mouth every 6 (six) hours as needed for mild pain.    Marland Kitchen aspirin 81 MG tablet Take 81 mg by mouth every evening.     . cimetidine (TAGAMET) 400 MG tablet Take 400 mg by mouth 2 (two) times daily.    . flecainide (TAMBOCOR) 50 MG tablet TAKE 100 MG IN THE AM AND 50 MG IN THE EVENING 270 tablet 6  . HYDROcodone-acetaminophen (NORCO/VICODIN) 5-325 MG per tablet Take 1-2 tablets by mouth every 4 (four) hours as needed for moderate pain or severe pain. 20 tablet 0  . metoprolol tartrate (LOPRESSOR) 25 MG tablet Take 1 tablet (25 mg total) by mouth 2 (two) times daily. 180 tablet 3  . nitroGLYCERIN (NITROGLYN) 2 % ointment Apply 0.5 inches topically daily as needed for chest pain.    . nitroGLYCERIN (NITROSTAT) 0.4 MG SL tablet Place 0.4 mg under the tongue every 5 (five) minutes as needed for chest pain (for anal fissure).     . ondansetron (ZOFRAN ODT) 8 MG disintegrating tablet Take 1 tablet (8 mg total) by mouth every 8 (eight) hours as needed for nausea or vomiting. 20 tablet 0  .  sertraline (ZOLOFT) 50 MG tablet Take 25 mg by mouth daily.      No current facility-administered medications for this visit.    Allergies  Allergen Reactions  . Penicillins     Passed out    Past Medical History  Diagnosis Date  . PAF (paroxysmal atrial fibrillation)     normal stress echo in 2011  . Hypertension   . Dyslipidemia   . GERD (gastroesophageal reflux disease)   . Obesity   . High risk medication use     on Flecainide since January of 2013    Past Surgical History  Procedure Laterality Date  . Wrist surgery      RIGHT WRIST  . Ankle surgery    . Stress echo test  07/25/2010    EF 60%  . Cardiovascular stress test  07/21/2007    EF 80%, NORMAL NONDIAGONISTIC FOR ISCHEMIA  . Abdominal hysterectomy    . +      History  Smoking status  . Current Every Day Smoker  Smokeless tobacco  . Not on file    History  Alcohol Use No    Family History  Problem Relation Age of Onset  . Alzheimer's disease Mother   . Heart disease Father   . Stroke Sister   . Lung cancer Brother   .  Cancer Brother     Review of Systems: The review of systems is per the HPI.  All other systems were reviewed and are negative.  Physical Exam: BP 110/80 mmHg  Pulse 57  Ht 5\' 6"  (1.676 m)  Wt 207 lb 6.4 oz (94.076 kg)  BMI 33.49 kg/m2 Patient is very pleasant and in no acute distress. She is obese. Skin is warm and dry. Color is normal.  HEENT is unremarkable. Normocephalic/atraumatic. PERRL. Sclera are nonicteric. Neck is supple. No masses. No JVD. Lungs are clear. Cardiac exam shows a regular rate and rhythm. Abdomen is soft. Extremities are without edema. Gait and ROM are intact. No gross neurologic deficits noted.  Wt Readings from Last 3 Encounters:  09/25/14 207 lb 6.4 oz (94.076 kg)  09/07/14 190 lb (86.183 kg)  11/08/13 208 lb 12.8 oz (94.711 kg)    LABORATORY DATA/PROCEDURES: EKG today shows sinus bradycardia. She has poor R wave progression.   Lab Results    Component Value Date   WBC 7.8 09/07/2014   HGB 14.9 09/07/2014   HCT 43.6 09/07/2014   PLT 204 09/07/2014   GLUCOSE 116* 09/07/2014   ALT 21 09/07/2014   AST 16 09/07/2014   NA 139 09/07/2014   K 4.4 09/07/2014   CL 103 09/07/2014   CREATININE 1.07 09/07/2014   BUN 17 09/07/2014   CO2 25 09/07/2014   TSH 2.66 04/02/2012   INR 0.97 06/25/2011    BNP (last 3 results) No results for input(s): PROBNP in the last 8760 hours.    US ABDOMEN LIMITED - RIGHT UPPER QUADRANT  COMPARISON: None.  FINDINGS: Gallbladder:  Limited visualization. Small calcifications are demonstrated in the inferior gallbladder suggesting small stones. No gallbladder wall thickening. Murphy's sign is negative.  Common bile duct:  Diameter: 3 mm, normal  Liver:  Mild diffuse increased parenchymal echotexture suggesting fatty infiltration. No focal lesions demonstrated.  IMPRESSION: Probable small stones in the gallbladder without wall thickening. Fatty infiltration of the liver appear   Electronically Signed  By: Lucienne Capers M.D.  On: 09/07/2014 03:59  Assessment / Plan: 1. Pre op clearance - negative stress echo from 2013. No active problems/symptoms - felt to be a satisfactory candidate for lap cholecystectomy.   2. PAF - remains in sinus by EKG  3. Tobacco abuse - cessation recommended once again  Will see back in July. She has labs by her PCP.   Patient is agreeable to this plan and will call if any problems develop in the interim.   Burtis Junes, RN, [place] [address] [place], [place]  [phone]

## 2014-09-25 NOTE — Telephone Encounter (Signed)
Received call from patient's husband.He stated wife needs gallbladder surgery.Stated they were told will need cardiac clearance first.Appointment scheduled with Truitt Merle NP this afternoon at 2:00 pm.

## 2014-10-31 ENCOUNTER — Other Ambulatory Visit (INDEPENDENT_AMBULATORY_CARE_PROVIDER_SITE_OTHER): Payer: Self-pay | Admitting: General Surgery

## 2014-10-31 DIAGNOSIS — K8010 Calculus of gallbladder with chronic cholecystitis without obstruction: Secondary | ICD-10-CM | POA: Diagnosis not present

## 2014-11-23 ENCOUNTER — Other Ambulatory Visit: Payer: Self-pay | Admitting: Nurse Practitioner

## 2014-11-24 ENCOUNTER — Ambulatory Visit: Payer: Medicare Other | Admitting: Cardiology

## 2014-11-28 ENCOUNTER — Other Ambulatory Visit: Payer: Self-pay | Admitting: Nurse Practitioner

## 2014-12-03 ENCOUNTER — Other Ambulatory Visit: Payer: Self-pay | Admitting: Nurse Practitioner

## 2014-12-11 ENCOUNTER — Other Ambulatory Visit: Payer: Self-pay | Admitting: Nurse Practitioner

## 2015-02-26 ENCOUNTER — Other Ambulatory Visit: Payer: Self-pay | Admitting: Geriatric Medicine

## 2015-02-26 DIAGNOSIS — F17210 Nicotine dependence, cigarettes, uncomplicated: Secondary | ICD-10-CM

## 2015-02-26 DIAGNOSIS — E669 Obesity, unspecified: Secondary | ICD-10-CM | POA: Diagnosis not present

## 2015-02-26 DIAGNOSIS — K6289 Other specified diseases of anus and rectum: Secondary | ICD-10-CM | POA: Diagnosis not present

## 2015-02-26 DIAGNOSIS — M25561 Pain in right knee: Secondary | ICD-10-CM | POA: Diagnosis not present

## 2015-02-26 DIAGNOSIS — Z6834 Body mass index (BMI) 34.0-34.9, adult: Secondary | ICD-10-CM | POA: Diagnosis not present

## 2015-02-26 DIAGNOSIS — F325 Major depressive disorder, single episode, in full remission: Secondary | ICD-10-CM | POA: Diagnosis not present

## 2015-03-01 ENCOUNTER — Ambulatory Visit
Admission: RE | Admit: 2015-03-01 | Discharge: 2015-03-01 | Disposition: A | Discharge status: Home / Self Care | Payer: Medicare Other | Source: Ambulatory Visit | Attending: Geriatric Medicine | Admitting: Geriatric Medicine

## 2015-03-01 DIAGNOSIS — Z122 Encounter for screening for malignant neoplasm of respiratory organs: Secondary | ICD-10-CM | POA: Diagnosis not present

## 2015-03-01 DIAGNOSIS — Z87891 Personal history of nicotine dependence: Secondary | ICD-10-CM | POA: Diagnosis not present

## 2015-03-01 DIAGNOSIS — F17210 Nicotine dependence, cigarettes, uncomplicated: Secondary | ICD-10-CM

## 2015-03-27 ENCOUNTER — Other Ambulatory Visit: Payer: Self-pay

## 2015-03-27 MED ORDER — METOPROLOL TARTRATE 25 MG PO TABS: ORAL_TABLET | ORAL | Status: DC

## 2015-04-12 ENCOUNTER — Other Ambulatory Visit: Payer: Self-pay | Admitting: *Deleted

## 2015-04-12 MED ORDER — METOPROLOL TARTRATE 25 MG PO TABS: ORAL_TABLET | ORAL | Status: DC

## 2015-06-04 ENCOUNTER — Other Ambulatory Visit: Payer: Self-pay

## 2015-06-04 MED ORDER — FLECAINIDE ACETATE 50 MG PO TABS: ORAL_TABLET | ORAL | Status: DC

## 2015-06-26 DIAGNOSIS — M9261 Juvenile osteochondrosis of tarsus, right ankle: Secondary | ICD-10-CM | POA: Diagnosis not present

## 2015-06-26 DIAGNOSIS — M7661 Achilles tendinitis, right leg: Secondary | ICD-10-CM | POA: Diagnosis not present

## 2015-09-07 ENCOUNTER — Other Ambulatory Visit: Payer: Self-pay | Admitting: *Deleted

## 2015-09-07 MED ORDER — FLECAINIDE ACETATE 50 MG PO TABS: ORAL_TABLET | ORAL | Status: DC

## 2015-10-02 DIAGNOSIS — Z23 Encounter for immunization: Secondary | ICD-10-CM | POA: Diagnosis not present

## 2015-10-15 ENCOUNTER — Other Ambulatory Visit: Payer: Self-pay

## 2015-10-15 MED ORDER — FLECAINIDE ACETATE 50 MG PO TABS: ORAL_TABLET | ORAL | Status: DC

## 2016-02-25 DIAGNOSIS — I480 Paroxysmal atrial fibrillation: Secondary | ICD-10-CM | POA: Diagnosis not present

## 2016-02-25 DIAGNOSIS — K219 Gastro-esophageal reflux disease without esophagitis: Secondary | ICD-10-CM | POA: Diagnosis not present

## 2016-02-25 DIAGNOSIS — Z23 Encounter for immunization: Secondary | ICD-10-CM | POA: Diagnosis not present

## 2016-02-25 DIAGNOSIS — F325 Major depressive disorder, single episode, in full remission: Secondary | ICD-10-CM | POA: Diagnosis not present

## 2016-02-25 DIAGNOSIS — E669 Obesity, unspecified: Secondary | ICD-10-CM | POA: Diagnosis not present

## 2016-02-25 DIAGNOSIS — Z6834 Body mass index (BMI) 34.0-34.9, adult: Secondary | ICD-10-CM | POA: Diagnosis not present

## 2016-04-15 ENCOUNTER — Other Ambulatory Visit: Payer: Self-pay | Admitting: Cardiology

## 2016-04-16 NOTE — Telephone Encounter (Signed)
Rx request sent to pharmacy.  

## 2016-05-14 ENCOUNTER — Other Ambulatory Visit: Payer: Self-pay | Admitting: Cardiology

## 2016-05-14 NOTE — Telephone Encounter (Signed)
Rx request sent to pharmacy.  

## 2016-05-22 ENCOUNTER — Other Ambulatory Visit: Payer: Self-pay | Admitting: Cardiology

## 2016-05-22 NOTE — Telephone Encounter (Signed)
Rx(s) sent to pharmacy electronically.  

## 2016-05-23 ENCOUNTER — Telehealth: Payer: Self-pay | Admitting: Cardiology

## 2016-05-23 MED ORDER — METOPROLOL TARTRATE 25 MG PO TABS: 25.0000 mg | ORAL_TABLET | Freq: Two times a day (BID) | ORAL | Status: DC

## 2016-05-23 MED ORDER — FLECAINIDE ACETATE 50 MG PO TABS: ORAL_TABLET | ORAL | Status: DC

## 2016-05-23 NOTE — Telephone Encounter (Signed)
Pt's husband made appt for her 06-20-16 with Randa Lynn denied -needed appt for refills-pls call

## 2016-05-23 NOTE — Telephone Encounter (Signed)
Returned call to patient refills for flecainide and metoprolol sent to pharmacy.Advised to keep appointment with Ignacia Bayley PA 06/20/16 at 10:00 am at [facility] office.

## 2016-06-16 ENCOUNTER — Other Ambulatory Visit: Payer: Self-pay | Admitting: Cardiology

## 2016-06-16 NOTE — Telephone Encounter (Signed)
REFILL 

## 2016-06-20 ENCOUNTER — Encounter: Payer: Self-pay | Admitting: Nurse Practitioner

## 2016-06-20 ENCOUNTER — Encounter (INDEPENDENT_AMBULATORY_CARE_PROVIDER_SITE_OTHER): Payer: Self-pay

## 2016-06-20 ENCOUNTER — Ambulatory Visit (INDEPENDENT_AMBULATORY_CARE_PROVIDER_SITE_OTHER): Payer: Medicare Other | Admitting: Nurse Practitioner

## 2016-06-20 VITALS — BP 115/68 | HR 61 | Ht 66.0 in | Wt 202.0 lb

## 2016-06-20 DIAGNOSIS — I10 Essential (primary) hypertension: Secondary | ICD-10-CM

## 2016-06-20 DIAGNOSIS — I480 Paroxysmal atrial fibrillation: Secondary | ICD-10-CM | POA: Diagnosis not present

## 2016-06-20 MED ORDER — METOPROLOL TARTRATE 25 MG PO TABS: 25.0000 mg | ORAL_TABLET | Freq: Two times a day (BID) | ORAL | 11 refills | Status: DC

## 2016-06-20 MED ORDER — FLECAINIDE ACETATE 50 MG PO TABS: ORAL_TABLET | ORAL | 11 refills | Status: DC

## 2016-06-20 MED ORDER — NITROGLYCERIN 0.4 MG SL SUBL: 0.4000 mg | SUBLINGUAL_TABLET | SUBLINGUAL | 3 refills | Status: DC | PRN

## 2016-06-20 NOTE — Patient Instructions (Signed)
Ignacia Bayley, NP, recommends that you schedule a follow-up appointment in 1 year with Dr [name]. You will receive a reminder letter in the mail two months in advance. If you don't receive a letter, please call our office to schedule the follow-up appointment.  If you need a refill on your cardiac medications before your next appointment, please call your pharmacy.

## 2016-06-20 NOTE — Progress Notes (Signed)
Office Visit    Patient Name: [name] Date of Encounter: 06/20/2016  Primary Care Provider:  Mathews Argyle, MD Primary Cardiologist:  P. [name], MD   Chief Complaint     70 year old female with a prior history of paroxysmal atrial fibrillation on flecainide, who presents for routine follow-up.  Past Medical History    Past Medical History:  Diagnosis Date  . Dyslipidemia   . GERD (gastroesophageal reflux disease)   . High risk medication use    on Flecainide since January of 2013  . Hypertension   . Obesity   . PAF (paroxysmal atrial fibrillation) (HCC)    normal stress echo in 2011   Past Surgical History:  Procedure Laterality Date  . +    . ABDOMINAL HYSTERECTOMY    . ANKLE SURGERY    . CARDIOVASCULAR STRESS TEST  07/21/2007   EF 80%, NORMAL NONDIAGONISTIC FOR ISCHEMIA  . STRESS ECHO TEST  07/25/2010   EF 60%  . WRIST SURGERY     RIGHT WRIST    Allergies  Allergies  Allergen Reactions  . Penicillins     Passed out    History of Present Illness    70 year old female with a prior history of paroxysmal atrial fibrillation, hypertension, hyperlipidemia, and remote tobacco abuse. Her A. fib has been well managed on flecainide therapy. She also has a history of a normal stress echo in 2013. She was last seen here in clinic in November 2015. Since that time, she is continuing to do quite well. She says she occasionally notes a very brief fluttering sensation in her chest but has not had any sustained arrhythmias to her knowledge. She has been tolerating flecainide and metoprolol well and is due for refills on both. She denies chest pain, dyspnea, PND, orthopnea, dizziness, syncope, edema, or early satiety.  Home Medications    Prior to Admission medications   Medication Sig Start Date End Date Taking? Authorizing Provider  acetaminophen (TYLENOL) 500 MG tablet Take 500 mg by mouth every 6 (six) hours as needed for mild pain.   Yes Historical  Provider, MD  aspirin 81 MG tablet Take 81 mg by mouth every evening.    Yes Historical Provider, MD  cimetidine (TAGAMET) 400 MG tablet Take 400 mg by mouth 2 (two) times daily.   Yes Historical Provider, MD  flecainide (TAMBOCOR) 50 MG tablet Take 2 tablets by mouth every morning and 1 tablet every evening. 05/23/16  Yes Peter M [name], MD  HYDROcodone-acetaminophen (NORCO/VICODIN) 5-325 MG per tablet Take 1-2 tablets by mouth every 4 (four) hours as needed for moderate pain or severe pain. 09/07/14  Yes Linton Flemings, MD  metoprolol tartrate (LOPRESSOR) 25 MG tablet Take 1 tablet (25 mg total) by mouth 2 (two) times daily. NEED OV. 06/16/16  Yes Peter M [name], MD  nitroGLYCERIN (NITROSTAT) 0.4 MG SL tablet Place 0.4 mg under the tongue every 5 (five) minutes as needed for chest pain (x 3 doses).    Yes Historical Provider, MD  sertraline (ZOLOFT) 50 MG tablet Take 25 mg by mouth daily.    Yes Historical Provider, MD    Review of Systems    As above, she's been doing well with only very infrequent and short-lived palpitations.  All other systems reviewed and are otherwise negative except as noted above.  Physical Exam    VS:  BP 115/68   Pulse 61   Ht 5\' 6"  (1.676 m)   Wt 202 lb (91.6  kg)   BMI 32.60 kg/m  , BMI Body mass index is 32.6 kg/m. GEN: Well nourished, well developed, in no acute distress.  HEENT: normal.  Neck: Supple, no JVD, carotid bruits, or masses. Cardiac: RRR, no murmurs, rubs, or gallops. No clubbing, cyanosis, edema.  Radials/DP/PT 2+ and equal bilaterally.  Respiratory:  Respirations regular and unlabored, clear to auscultation bilaterally. GI: Soft, nontender, nondistended, BS + x 4. MS: no deformity or atrophy. Skin: warm and dry, no rash. Neuro:  Strength and sensation are intact. Psych: Normal affect.  Accessory Clinical Findings    ECG - Regular sinus rhythm, 61, no acute ST or T changes.  Assessment & Plan    1.  Paroxysmal atrial fibrillation: Patient  has been doing well over the past year and a half without any recurrence of sustained arrhythmias. She has tolerated flecainide and metoprolol well. She is on aspirin only with regards to anticoagulation. We will refill her flecainide and metoprolol today. Next  2. Essential hypertension: Blood pressure is well controlled on beta blocker therapy.  3. Disposition: Patient will follow up with [name] in one year or sooner if necessary.   Murray Hodgkins, NP 06/20/2016, 10:10 AM

## 2016-08-27 DIAGNOSIS — Z23 Encounter for immunization: Secondary | ICD-10-CM | POA: Diagnosis not present

## 2017-02-26 DIAGNOSIS — Z0000 Encounter for general adult medical examination without abnormal findings: Secondary | ICD-10-CM | POA: Diagnosis not present

## 2017-02-26 DIAGNOSIS — E559 Vitamin D deficiency, unspecified: Secondary | ICD-10-CM | POA: Diagnosis not present

## 2017-02-26 DIAGNOSIS — R0689 Other abnormalities of breathing: Secondary | ICD-10-CM | POA: Diagnosis not present

## 2017-02-26 DIAGNOSIS — E038 Other specified hypothyroidism: Secondary | ICD-10-CM | POA: Diagnosis not present

## 2017-02-26 DIAGNOSIS — K648 Other hemorrhoids: Secondary | ICD-10-CM | POA: Diagnosis not present

## 2017-02-26 DIAGNOSIS — E782 Mixed hyperlipidemia: Secondary | ICD-10-CM | POA: Diagnosis not present

## 2017-02-26 DIAGNOSIS — I10 Essential (primary) hypertension: Secondary | ICD-10-CM | POA: Diagnosis not present

## 2017-02-26 DIAGNOSIS — I482 Chronic atrial fibrillation: Secondary | ICD-10-CM | POA: Diagnosis not present

## 2017-02-26 DIAGNOSIS — E119 Type 2 diabetes mellitus without complications: Secondary | ICD-10-CM | POA: Diagnosis not present

## 2017-02-26 DIAGNOSIS — F17210 Nicotine dependence, cigarettes, uncomplicated: Secondary | ICD-10-CM | POA: Diagnosis not present

## 2017-02-26 DIAGNOSIS — D518 Other vitamin B12 deficiency anemias: Secondary | ICD-10-CM | POA: Diagnosis not present

## 2017-03-10 DIAGNOSIS — Z1211 Encounter for screening for malignant neoplasm of colon: Secondary | ICD-10-CM | POA: Diagnosis not present

## 2017-03-10 DIAGNOSIS — Z1212 Encounter for screening for malignant neoplasm of rectum: Secondary | ICD-10-CM | POA: Diagnosis not present

## 2017-07-03 DIAGNOSIS — K219 Gastro-esophageal reflux disease without esophagitis: Secondary | ICD-10-CM | POA: Diagnosis not present

## 2017-07-03 DIAGNOSIS — F17210 Nicotine dependence, cigarettes, uncomplicated: Secondary | ICD-10-CM | POA: Diagnosis not present

## 2017-07-03 DIAGNOSIS — E781 Pure hyperglyceridemia: Secondary | ICD-10-CM | POA: Diagnosis not present

## 2017-07-03 DIAGNOSIS — I482 Chronic atrial fibrillation: Secondary | ICD-10-CM | POA: Diagnosis not present

## 2017-07-03 DIAGNOSIS — Z23 Encounter for immunization: Secondary | ICD-10-CM | POA: Diagnosis not present

## 2017-07-03 DIAGNOSIS — K648 Other hemorrhoids: Secondary | ICD-10-CM | POA: Diagnosis not present

## 2017-07-05 ENCOUNTER — Other Ambulatory Visit: Payer: Self-pay | Admitting: Nurse Practitioner

## 2017-07-08 ENCOUNTER — Other Ambulatory Visit: Payer: Self-pay | Admitting: Nurse Practitioner

## 2017-09-10 DIAGNOSIS — Z23 Encounter for immunization: Secondary | ICD-10-CM | POA: Diagnosis not present

## 2017-10-01 ENCOUNTER — Encounter: Payer: Self-pay | Admitting: Cardiovascular Disease

## 2017-10-01 ENCOUNTER — Ambulatory Visit (INDEPENDENT_AMBULATORY_CARE_PROVIDER_SITE_OTHER): Payer: Medicare Other | Admitting: Cardiovascular Disease

## 2017-10-01 VITALS — BP 134/78 | HR 59 | Ht 66.0 in | Wt 200.4 lb

## 2017-10-01 DIAGNOSIS — Z79899 Other long term (current) drug therapy: Secondary | ICD-10-CM

## 2017-10-01 DIAGNOSIS — I480 Paroxysmal atrial fibrillation: Secondary | ICD-10-CM | POA: Diagnosis not present

## 2017-10-01 DIAGNOSIS — Z5181 Encounter for therapeutic drug level monitoring: Secondary | ICD-10-CM | POA: Diagnosis not present

## 2017-10-01 NOTE — Patient Instructions (Signed)
Dr Sallyanne Kuster recommends that you schedule a follow-up appointment in 12 months with Dr [name]. You will receive a reminder letter in the mail two months in advance. If you don't receive a letter, please call our office to schedule the follow-up appointment.  If you need a refill on your cardiac medications before your next appointment, please call your pharmacy.

## 2017-10-01 NOTE — Progress Notes (Signed)
Cardiology Office Note:    Date:  10/01/2017   ID:  [name], DOB 06-27-1946, MRN [id number]  PCP:  [name], MD  Cardiologist:  Sanda Klein, MD    Referring MD: [name], MD   Chief complaint: Follow up atrial fibrillation   History of Present Illness:    [name] is a 71 y.o. female with a hx of infrequent episodes of paroxysmal atrial fibrillation, well controlled on a combination of flecainide and metoprolol.  She has no known structural heart disease.  She had a normal stress test in the past.  She is very rarely troubled by palpitations and these are always brief.  She has not had any serious bleeding problems and has never had a TIA/stroke or any other embolic events  She has not had any new health challenges in the years since her last appointment, but unfortunately has lost her husband [name] heart disease in July of this year.  She is still living in their old home, by herself.  She is taking aspirin for stroke prevention.  She has never been on true anticoagulants.  Past Medical History:  Diagnosis Date  . Dyslipidemia   . GERD (gastroesophageal reflux disease)   . High risk medication use    on Flecainide since January of 2013  . Hypertension   . Obesity   . PAF (paroxysmal atrial fibrillation) (HCC)    normal stress echo in 2011    Past Surgical History:  Procedure Laterality Date  . +    . ABDOMINAL HYSTERECTOMY    . ANKLE SURGERY    . CARDIOVASCULAR STRESS TEST  07/21/2007   EF 80%, NORMAL NONDIAGONISTIC FOR ISCHEMIA  . STRESS ECHO TEST  07/25/2010   EF 60%  . WRIST SURGERY     RIGHT WRIST    Current Medications: Current Meds  Medication Sig  . acetaminophen (TYLENOL) 500 MG tablet Take 500 mg by mouth every 6 (six) hours as needed for mild pain.  Marland Kitchen aspirin 81 MG tablet Take 81 mg by mouth every evening.   . cimetidine (TAGAMET) 400 MG tablet Take 400 mg by mouth 2 (two) times daily.  . flecainide (TAMBOCOR) 50 MG  tablet TAKE 2 TABLETS EVERY MORNING AND 1 TABLET IN THE EVENING  . metoprolol tartrate (LOPRESSOR) 25 MG tablet Take 1 tablet (25 mg total) by mouth 2 (two) times daily.  . metoprolol tartrate (LOPRESSOR) 25 MG tablet TAKE 1 TABLET TWICE A DAY  . nitroGLYCERIN (NITROSTAT) 0.4 MG SL tablet Place 1 tablet (0.4 mg total) under the tongue every 5 (five) minutes as needed for chest pain (x 3 doses).  . sertraline (ZOLOFT) 50 MG tablet Take 25 mg by mouth daily.      Allergies:   Penicillins   Social History   Socioeconomic History  . Marital status: Married    Spouse name: Not on file  . Number of children: Not on file  . Years of education: Not on file  . Highest education level: Not on file  Social Needs  . Financial resource strain: Not on file  . Food insecurity - worry: Not on file  . Food insecurity - inability: Not on file  . Transportation needs - medical: Not on file  . Transportation needs - non-medical: Not on file  Occupational History  . Not on file  Tobacco Use  . Smoking status: Current Every Day Smoker  . Smokeless tobacco: Never Used  Substance and Sexual Activity  .  Alcohol use: No  . Drug use: No  . Sexual activity: Yes  Other Topics Concern  . Not on file  Social History Narrative  . Not on file     Family History: The patient's family history includes Alzheimer's disease in her mother; Cancer in her brother; Heart disease in her father; Lung cancer in her brother; Stroke in her sister. ROS:   Please see the history of present illness.     All other systems reviewed and are negative.  EKGs/Labs/Other Studies Reviewed:    EKG is ordered today.  The ekg ordered today demonstrates Normal sinus rhythm with nonspecific T wave flattening in the anterior leads.  QRS is narrow at 84 ms, QTC normal 378 ms  Recent Labs: No results found for requested labs within last 8760 hours.  Recent Lipid Panel No results found for: CHOL, TRIG, HDL, CHOLHDL, VLDL, LDLCALC,  LDLDIRECT  Physical Exam:    VS:  BP 134/78   Pulse (!) 59   Ht 5\' 6"  (1.676 m)   Wt 200 lb 6.4 oz (90.9 kg)   BMI 32.35 kg/m     Wt Readings from Last 3 Encounters:  10/01/17 200 lb 6.4 oz (90.9 kg)  06/20/16 202 lb (91.6 kg)  09/25/14 207 lb 6.4 oz (94.1 kg)     GEN:  Well nourished, well developed in no acute distress HEENT: Normal NECK: No JVD; No carotid bruits LYMPHATICS: No lymphadenopathy CARDIAC: RRR, no murmurs, rubs, gallops RESPIRATORY:  Clear to auscultation without rales, wheezing or rhonchi  ABDOMEN: Soft, non-tender, non-distended MUSCULOSKELETAL:  No edema; No deformity  SKIN: Warm and dry NEUROLOGIC:  Alert and oriented x 3 PSYCHIATRIC:  Normal affect   ASSESSMENT:    1. Paroxysmal atrial fibrillation (HCC)   2. Encounter for monitoring flecainide therapy    PLAN:    In order of problems listed above:  1. AFib: very infrequent well controlled. CHADSvasc 3 (age 61, gender).  At least statistically she qualifies for full anticoagulation with a direct oral anticoagulant such as Eliquis or Xarelto.  She has never had embolic events and her episodes of arrhythmia appear to be very brief.  We discussed the pros and cons of anticoagulation and this appeared to be a bit of a new discussion for her.  Not ready to make a commitment to switch to a full anticoagulant, but she will discussed this with [name] when she sees him next year.  In the meantime she should promptly communicate even the most brief neurological events to [place]. 2. Flecainide: No symptoms of toxicity and no signs of problem on EKG.   Medication Adjustments/Labs and Tests Ordered: Current medicines are reviewed at length with the patient today.  Concerns regarding medicines are outlined above.  No orders of the defined types were placed in this encounter.  No orders of the defined types were placed in this encounter.   Signed, Sanda Klein, MD  10/01/2017 8:53 AM    [facility]

## 2017-10-07 NOTE — Addendum Note (Signed)
Addended by: Zebedee Iba on: 10/07/2017 03:09 PM   Modules accepted: Orders

## 2017-10-21 ENCOUNTER — Encounter (HOSPITAL_COMMUNITY): Payer: Self-pay | Admitting: Emergency Medicine

## 2017-10-21 ENCOUNTER — Other Ambulatory Visit: Payer: Self-pay

## 2017-10-21 ENCOUNTER — Emergency Department (HOSPITAL_COMMUNITY)
Admission: EM | Admit: 2017-10-21 | Discharge: 2017-10-22 | Disposition: A | Discharge status: Home / Self Care | Payer: Medicare Other | Attending: Emergency Medicine | Admitting: Emergency Medicine

## 2017-10-21 ENCOUNTER — Emergency Department (HOSPITAL_COMMUNITY): Payer: Medicare Other

## 2017-10-21 DIAGNOSIS — N83201 Unspecified ovarian cyst, right side: Secondary | ICD-10-CM | POA: Insufficient documentation

## 2017-10-21 DIAGNOSIS — Z79899 Other long term (current) drug therapy: Secondary | ICD-10-CM | POA: Diagnosis not present

## 2017-10-21 DIAGNOSIS — R109 Unspecified abdominal pain: Secondary | ICD-10-CM | POA: Diagnosis present

## 2017-10-21 DIAGNOSIS — D3501 Benign neoplasm of right adrenal gland: Secondary | ICD-10-CM | POA: Insufficient documentation

## 2017-10-21 DIAGNOSIS — N12 Tubulo-interstitial nephritis, not specified as acute or chronic: Secondary | ICD-10-CM | POA: Diagnosis not present

## 2017-10-21 DIAGNOSIS — Z7982 Long term (current) use of aspirin: Secondary | ICD-10-CM | POA: Insufficient documentation

## 2017-10-21 DIAGNOSIS — K5730 Diverticulosis of large intestine without perforation or abscess without bleeding: Secondary | ICD-10-CM | POA: Diagnosis not present

## 2017-10-21 DIAGNOSIS — F17200 Nicotine dependence, unspecified, uncomplicated: Secondary | ICD-10-CM | POA: Insufficient documentation

## 2017-10-21 DIAGNOSIS — Z0389 Encounter for observation for other suspected diseases and conditions ruled out: Secondary | ICD-10-CM | POA: Diagnosis not present

## 2017-10-21 DIAGNOSIS — I10 Essential (primary) hypertension: Secondary | ICD-10-CM | POA: Insufficient documentation

## 2017-10-21 LAB — BASIC METABOLIC PANEL
Anion gap: 12 (ref 5–15)
BUN: 15 mg/dL (ref 6–20)
CHLORIDE: 101 mmol/L (ref 101–111)
CO2: 23 mmol/L (ref 22–32)
Calcium: 9.6 mg/dL (ref 8.9–10.3)
Creatinine, Ser: 1.14 mg/dL — ABNORMAL HIGH (ref 0.44–1.00)
GFR calc Af Amer: 56 mL/min — ABNORMAL LOW (ref >60)
GFR calc non Af Amer: 48 mL/min — ABNORMAL LOW (ref >60)
Glucose, Bld: 116 mg/dL — ABNORMAL HIGH (ref 65–99)
POTASSIUM: 4.2 mmol/L (ref 3.5–5.1)
SODIUM: 136 mmol/L (ref 135–145)

## 2017-10-21 LAB — URINALYSIS, ROUTINE W REFLEX MICROSCOPIC
Bilirubin Urine: NEGATIVE
GLUCOSE, UA: NEGATIVE mg/dL
Ketones, ur: NEGATIVE mg/dL
NITRITE: POSITIVE — AB
PH: 5 (ref 5.0–8.0)
Protein, ur: 30 mg/dL — AB
SPECIFIC GRAVITY, URINE: 1.02 (ref 1.005–1.030)

## 2017-10-21 LAB — CBC
HCT: 46.8 % — ABNORMAL HIGH (ref 36.0–46.0)
Hemoglobin: 16 g/dL — ABNORMAL HIGH (ref 12.0–15.0)
MCH: 29.7 pg (ref 26.0–34.0)
MCHC: 34.2 g/dL (ref 30.0–36.0)
MCV: 87 fL (ref 78.0–100.0)
Platelets: 212 10*3/uL (ref 150–400)
RBC: 5.38 MIL/uL — ABNORMAL HIGH (ref 3.87–5.11)
RDW: 13.1 % (ref 11.5–15.5)
WBC: 12.9 10*3/uL — AB (ref 4.0–10.5)

## 2017-10-21 MED ORDER — ACETAMINOPHEN 500 MG PO TABS: 1000.0000 mg | ORAL_TABLET | Freq: Once | ORAL | Status: DC

## 2017-10-21 MED ORDER — SODIUM CHLORIDE 0.9 % IV BOLUS (SEPSIS): 1000.0000 mL | Freq: Once | INTRAVENOUS | Status: DC

## 2017-10-21 MED ORDER — MORPHINE SULFATE (PF) 4 MG/ML IV SOLN: 4.0000 mg | Freq: Once | INTRAVENOUS | Status: DC

## 2017-10-21 MED ORDER — ACETAMINOPHEN 500 MG PO TABS
500.0000 mg | ORAL_TABLET | Freq: Once | ORAL | Status: AC
  Administered 2017-10-22: 500 mg via ORAL
  Filled 2017-10-21: qty 1

## 2017-10-21 MED ORDER — ONDANSETRON 4 MG PO TBDP: 4.0000 mg | ORAL_TABLET | Freq: Once | ORAL | Status: DC

## 2017-10-21 MED ORDER — ONDANSETRON HCL 4 MG/2ML IJ SOLN: 4.0000 mg | Freq: Once | INTRAMUSCULAR | Status: DC

## 2017-10-21 NOTE — ED Provider Notes (Signed)
[facility]ENC[facility] DEPARTMENT Provider Note   CSN: [id number] Arrival date & time: 10/21/17  1742     History   Chief Complaint Chief Complaint  Patient presents with  . Atrial Fibrillation  . Flank Pain    HPI [name] is a 71 y.o. female with history of paroxysmal atrial fibrillation, GERD, hypertension presents for evaluation of constant right flank pain onset 3 AM today. Associated symptoms include nausea, vomiting, chills, foul-smelling urine, decreased appetite due to nausea, and decreased urine output. No fevers, dysuria, hematuria, urgency, frequency, abnormal vaginal discharge or bleeding, abdominal pain. Reports a remote history of pyelonephritis 40 years ago. Has not eaten or drank today due to nausea. Denies recurrent UTIs. And they'll alleviate some of the pain. Pain worse with palpation. States this morning when the flank pain that woke her from her sleep she had palpitations, she attributes this to atrial fibrillation. States this resolved after 20-30 minutes. No associated chest pain, light-headedness, SOB.  HPI  Past Medical History:  Diagnosis Date  . Dyslipidemia   . GERD (gastroesophageal reflux disease)   . High risk medication use    on Flecainide since January of 2013  . Hypertension   . Obesity   . PAF (paroxysmal atrial fibrillation) (HCC)    normal stress echo in 2011    Patient Active Problem List   Diagnosis Date Noted  . Hypertension 07/06/2012  . Paroxysmal atrial fibrillation ([facility]) 11/27/2011  . DIARRHEA, ACUTE 05/31/2009  . GERD 03/06/2009  . BACK PAIN 03/06/2009  . ANXIETY 06/12/2007  . DEPRESSION 06/12/2007  . HEADACHE 06/12/2007    Past Surgical History:  Procedure Laterality Date  . +    . ABDOMINAL HYSTERECTOMY    . ANKLE SURGERY    . CARDIOVASCULAR STRESS TEST  07/21/2007   EF 80%, NORMAL NONDIAGONISTIC FOR ISCHEMIA  . STRESS ECHO TEST  07/25/2010   EF 60%  . WRIST SURGERY     RIGHT WRIST    OB  History    No data available       Home Medications    Prior to Admission medications   Medication Sig Start Date End Date Taking? Authorizing Provider  acetaminophen (TYLENOL) 500 MG tablet Take 500 mg by mouth every 6 (six) hours as needed for mild pain.   Yes [provider]  aspirin 81 MG tablet Take 81 mg by mouth every evening.    Yes [provider]  cimetidine (TAGAMET) 400 MG tablet Take 400 mg by mouth 2 (two) times daily.   Yes [provider]  flecainide (TAMBOCOR) 50 MG tablet TAKE 2 TABLETS EVERY MORNING AND 1 TABLET IN THE EVENING Patient taking differently: Take 100 mg by mouth in the morning and 50 mg in the evening 07/06/17  Yes [name], Peter M, MD  metoprolol tartrate (LOPRESSOR) 25 MG tablet TAKE 1 TABLET TWICE A DAY Patient taking differently: Take 25 mg by mouth two times a day 07/06/17  Yes [name], Peter M, MD  nitroGLYCERIN (NITROSTAT) 0.4 MG SL tablet Place 1 tablet (0.4 mg total) under the tongue every 5 (five) minutes as needed for chest pain (x 3 doses). 06/20/16  Yes Rogelia Mire, NP  sertraline (ZOLOFT) 50 MG tablet Take 50 mg by mouth daily.    Yes [provider]  ciprofloxacin (CIPRO) 500 MG tablet Take 1 tablet (500 mg total) by mouth 2 (two) times daily. 10/22/17   Kinnie Feil, PA-C  HYDROcodone-acetaminophen (NORCO/VICODIN) [phone]  MG per tablet Take 1-2 tablets by mouth every 4 (four) hours as needed for moderate pain or severe pain. Patient not taking: Reported on 10/21/2017 09/07/14   Linton Flemings, MD  metoprolol tartrate (LOPRESSOR) 25 MG tablet Take 1 tablet (25 mg total) by mouth 2 (two) times daily. Patient not taking: Reported on 10/21/2017 06/20/16   Rogelia Mire, NP  ondansetron (ZOFRAN ODT) 4 MG disintegrating tablet Take 1 tablet (4 mg total) by mouth every 8 (eight) hours as needed for nausea or vomiting. 10/22/17   Kinnie Feil, PA-C    Family History Family History  Problem Relation Age  of Onset  . Alzheimer's disease Mother   . Heart disease Father   . Stroke Sister   . Lung cancer Brother   . Cancer Brother     Social History Social History   Tobacco Use  . Smoking status: Current Every Day Smoker  . Smokeless tobacco: Never Used  Substance Use Topics  . Alcohol use: No  . Drug use: No     Allergies   Penicillins and Tape   Review of Systems Review of Systems  Constitutional: Positive for appetite change, chills and diaphoresis.  Cardiovascular: Positive for palpitations (resolved).  Gastrointestinal: Positive for nausea and vomiting.  Genitourinary: Positive for decreased urine volume and flank pain.       +foul smelling urine  All other systems reviewed and are negative.    Physical Exam Updated Vital Signs BP (!) 133/59 (BP Location: Right Arm)   Pulse 65   Temp 99 F (37.2 C) (Oral)   Resp 18   SpO2 100%   Physical Exam  Constitutional: She is oriented to person, place, and time. She appears well-developed and well-nourished. No distress.  Non toxic.   HENT:  Head: Normocephalic and atraumatic.  Nose: Nose normal.  Mouth/Throat: Oropharynx is clear and moist. No oropharyngeal exudate.  Moist mucous membranes  Eyes: Conjunctivae and EOM are normal. Pupils are equal, round, and reactive to light.  Neck: Normal range of motion. Neck supple.  Cardiovascular: Normal rate, regular rhythm, normal heart sounds and intact distal pulses.  No murmur heard. HR 65. RRR. No LE edema or calf tenderness. 2+ DP and radial pulses bilaterally.   Pulmonary/Chest: Effort normal and breath sounds normal. She exhibits no tenderness.  Abdominal: Soft. Bowel sounds are normal. She exhibits no distension. There is tenderness.  +RCVAT No suprapubic tenderness. No G/R/R.   Musculoskeletal: Normal range of motion. She exhibits no deformity.  Lymphadenopathy:    She has no cervical adenopathy.  Neurological: She is alert and oriented to person, place, and  time. No sensory deficit.  Skin: Skin is warm and dry. Capillary refill takes less than 2 seconds.  Psychiatric: She has a normal mood and affect. Her behavior is normal. Judgment and thought content normal.  Nursing note and vitals reviewed.    ED Treatments / Results  Labs (all labs ordered are listed, but only abnormal results are displayed) Labs Reviewed  URINALYSIS, ROUTINE W REFLEX MICROSCOPIC - Abnormal; Notable for the following components:      Result Value   Color, Urine AMBER (*)    APPearance HAZY (*)    Hgb urine dipstick SMALL (*)    Protein, ur 30 (*)    Nitrite POSITIVE (*)    Leukocytes, UA SMALL (*)    Bacteria, UA MANY (*)    Squamous Epithelial / LPF 0-5 (*)    All other components within normal  limits  BASIC METABOLIC PANEL - Abnormal; Notable for the following components:   Glucose, Bld 116 (*)    Creatinine, Ser 1.14 (*)    GFR calc non Af Amer 48 (*)    GFR calc Af Amer 56 (*)    All other components within normal limits  CBC - Abnormal; Notable for the following components:   WBC 12.9 (*)    RBC 5.38 (*)    Hemoglobin 16.0 (*)    HCT 46.8 (*)    All other components within normal limits  URINE CULTURE    EKG  EKG Interpretation  Date/Time:  Wednesday October 21 2017 17:54:50 EST Ventricular Rate:  64 PR Interval:  178 QRS Duration: 76 QT Interval:  400 QTC Calculation: 412 R Axis:   35 Text Interpretation:  Normal sinus rhythm Nonspecific T wave abnormality Abnormal ECG Confirmed by Sherwood Gambler [phone]) on 10/22/2017 12:10:23 AM       Radiology Ct Renal Stone Study  Result Date: 10/21/2017 CLINICAL DATA:  71 year old female with right-sided flank pain and urinary retention. EXAM: CT ABDOMEN AND PELVIS WITHOUT CONTRAST TECHNIQUE: Multidetector CT imaging of the abdomen and pelvis was performed following the standard protocol without IV contrast. COMPARISON:  Abdominal ultrasound dated 09/07/2014 FINDINGS: Evaluation of this exam is  limited in the absence of intravenous contrast. Lower chest: Bibasilar linear atelectasis/ scarring. No intra-abdominal Hepatobiliary: Cholecystectomy. The liver is unremarkable. No intrahepatic biliary ductal dilatation. Pancreas: Unremarkable. No pancreatic ductal dilatation or surrounding inflammatory changes. Spleen: Scattered calcified splenic granuloma. Adrenals/Urinary Tract: There is a 1.8 cm right adrenal adenoma. The left adrenal gland is unremarkable. There is no hydronephrosis or nephrolithiasis on either side. The visualized ureters appear unremarkable. The urinary bladder is collapsed. Stomach/Bowel: Small scattered colonic diverticula without active inflammation. There is no evidence of bowel obstruction or active inflammation. Normal appendix. Vascular/Lymphatic: Mild atherosclerotic calcification of the abdominal aorta. No aneurysmal dilatation. Evaluation of the vasculature is limited in the absence of intravenous contrast. No portal venous gas. There is no adenopathy. Reproductive: Hysterectomy. There is a 3 cm right ovarian cyst. Pelvic ultrasound is recommended for further evaluation. Other: None Musculoskeletal: Degenerative changes of the spine and hips. Disc desiccation and vacuum phenomena at L2-L3. No acute osseous pathology. IMPRESSION: 1. No acute intra-abdominal or pelvic pathology. No hydronephrosis or nephrolithiasis. 2. A 3 cm right ovarian cyst. Further evaluation with pelvic ultrasound recommended. 3. Small scattered colonic diverticula. No bowel obstruction or active inflammation. Normal appendix. 4. Right adrenal adenoma. Electronically Signed   By: Anner Crete M.D.   On: 10/21/2017 23:16    Procedures Procedures (including critical care time)  Medications Ordered in ED Medications  acetaminophen (TYLENOL) tablet 500 mg (not administered)  ondansetron (ZOFRAN-ODT) disintegrating tablet 4 mg (not administered)  ciprofloxacin (CIPRO) tablet 500 mg (not administered)       Initial Impression / Assessment and Plan / ED Course  I have reviewed the triage vital signs and the nursing notes.  Pertinent labs & imaging results that were available during my care of the patient were reviewed by me and considered in my medical decision making (see chart for details).  Clinical Course as of Oct 22 49  Wed Oct 21, 2017  2219 WBC: (!) 12.9 [CG]  2219 Creatinine: (!) 1.14 [CG]  2219 Hgb urine dipstick: (!) SMALL [CG]  2219 Nitrite: (!) POSITIVE [CG]  2219 Leukocytes, UA: (!) SMALL [CG]  2219 Bacteria, UA: (!) MANY [CG]  2356 IMPRESSION: 1. No acute intra-abdominal  or pelvic pathology. No hydronephrosis or nephrolithiasis. 2. A 3 cm right ovarian cyst. Further evaluation with pelvic ultrasound recommended. 3. Small scattered colonic diverticula. No bowel obstruction or active inflammation. Normal appendix. 4. Right adrenal adenoma. CT Renal Stone Study [CG]    Clinical Course User Index [CG] Kinnie Feil, PA-C   71 yo female presents with right flank pain, foul smelling urine, chills, nausea, vomiting and decreased urine output x 24 hours. Brief episode of palpitations when pain started, resolved. No CP, SOB or palpitations since. EKG shows NSR today. On exam she is non toxic, afebrile and w/o tachycardia. +RCVAT. Exam otherwise unremarkable.   Lab work shows UTI with mild leukocytosis. Slight increase in creatinine and hemoconcentration likely from dehydration.  CT renal study done to r/o infected stone. This showed new right ovarian cyst, but otherwise unremarkable.   Pt tolerated PO challenge. Given first dose of ciprofloxacin (severe PCN allergy) given in ED. She will be discharged with abx, zofran and close f/u. Discussed return precautions. Pt shared with supervising physician.  Final Clinical Impressions(s) / ED Diagnoses   Final diagnoses:  Pyelonephritis  Right ovarian cyst  Adenoma of right adrenal gland    ED Discharge Orders         Ordered    ciprofloxacin (CIPRO) 500 MG tablet  2 times daily     10/22/17 0015    ondansetron (ZOFRAN ODT) 4 MG disintegrating tablet  Every 8 hours PRN     10/22/17 0015       Kinnie Feil, PA-C 10/22/17 0050    Sherwood Gambler, MD 10/22/17 [phone]

## 2017-10-21 NOTE — ED Triage Notes (Signed)
Pt to ER for evaluation of right flank pain with associated foul smelling urine for 2-3 days. States has not been urinating as often. Also states today when she got out of the shower she went into a fib and became SOB. At this time patient is in NSR, NAD. A/o x4.

## 2017-10-22 DIAGNOSIS — N12 Tubulo-interstitial nephritis, not specified as acute or chronic: Secondary | ICD-10-CM | POA: Diagnosis not present

## 2017-10-22 MED ORDER — CIPROFLOXACIN HCL 500 MG PO TABS
500.0000 mg | ORAL_TABLET | Freq: Once | ORAL | Status: AC
  Administered 2017-10-22: 500 mg via ORAL
  Filled 2017-10-22: qty 1

## 2017-10-22 MED ORDER — ONDANSETRON 4 MG PO TBDP
4.0000 mg | ORAL_TABLET | Freq: Once | ORAL | Status: AC
  Administered 2017-10-22: 4 mg via ORAL
  Filled 2017-10-22: qty 1

## 2017-10-22 MED ORDER — CIPROFLOXACIN HCL 500 MG PO TABS: 500.0000 mg | ORAL_TABLET | Freq: Two times a day (BID) | ORAL | 0 refills | Status: DC

## 2017-10-22 MED ORDER — ONDANSETRON 4 MG PO TBDP: 4.0000 mg | ORAL_TABLET | Freq: Three times a day (TID) | ORAL | 0 refills | Status: DC | PRN

## 2017-10-22 NOTE — Discharge Instructions (Signed)
You have a kidney infection. Please take antibiotics as prescribed. Take Zofran for nausea. You may take Tylenol for associated pain in your flank. Stay well-hydrated. Return to the ED if you develop worsening pain, fevers, inability to tolerate fluids, unable to urinate. Your ultrasound showed a right ovarian cyst that needs further evaluation with ultrasound, contact your primary care doctor to schedule this.

## 2017-10-25 LAB — URINE CULTURE: Culture: >=100000 — AB

## 2017-10-26 ENCOUNTER — Telehealth: Payer: Self-pay | Admitting: Emergency Medicine

## 2017-10-26 NOTE — Telephone Encounter (Signed)
Post ED Visit - Positive Culture Follow-up  Culture report reviewed by antimicrobial stewardship pharmacist:  []  Elenor Quinones, Pharm.D. []  Heide Guile, Pharm.D., BCPS AQ-ID []  Parks Neptune, Pharm.D., BCPS []  Alycia Rossetti, Pharm.D., BCPS []  [place], [place].D., BCPS, AAHIVP []  Legrand Como, Pharm.D., BCPS, AAHIVP []  Salome Arnt, PharmD, BCPS []  Dimitri Ped, PharmD, BCPS []  Vincenza Hews, PharmD, BCPS  Positive urine culture Treated with ciprofloxacin, organism sensitive to the same and no further patient follow-up is required at this time.  Hazle Nordmann 10/26/2017, 10:33 AM

## 2017-11-03 DIAGNOSIS — M25511 Pain in right shoulder: Secondary | ICD-10-CM | POA: Diagnosis not present

## 2017-11-03 DIAGNOSIS — D3501 Benign neoplasm of right adrenal gland: Secondary | ICD-10-CM | POA: Diagnosis not present

## 2017-11-03 DIAGNOSIS — Z0000 Encounter for general adult medical examination without abnormal findings: Secondary | ICD-10-CM | POA: Diagnosis not present

## 2017-11-03 DIAGNOSIS — N83201 Unspecified ovarian cyst, right side: Secondary | ICD-10-CM | POA: Diagnosis not present

## 2017-11-05 DIAGNOSIS — N83201 Unspecified ovarian cyst, right side: Secondary | ICD-10-CM | POA: Diagnosis not present

## 2017-11-05 DIAGNOSIS — D3501 Benign neoplasm of right adrenal gland: Secondary | ICD-10-CM | POA: Diagnosis not present

## 2017-11-06 DIAGNOSIS — D3501 Benign neoplasm of right adrenal gland: Secondary | ICD-10-CM | POA: Diagnosis not present

## 2017-11-27 ENCOUNTER — Telehealth: Payer: Self-pay

## 2017-11-27 DIAGNOSIS — K625 Hemorrhage of anus and rectum: Secondary | ICD-10-CM | POA: Diagnosis not present

## 2017-11-27 DIAGNOSIS — Z9049 Acquired absence of other specified parts of digestive tract: Secondary | ICD-10-CM | POA: Diagnosis not present

## 2017-11-27 DIAGNOSIS — I480 Paroxysmal atrial fibrillation: Secondary | ICD-10-CM | POA: Diagnosis not present

## 2017-11-27 DIAGNOSIS — R195 Other fecal abnormalities: Secondary | ICD-10-CM | POA: Diagnosis not present

## 2017-11-27 DIAGNOSIS — R42 Dizziness and giddiness: Secondary | ICD-10-CM | POA: Diagnosis not present

## 2017-11-27 NOTE — Telephone Encounter (Signed)
   [facility] Pre-operative Risk Assessment    Request for surgical clearance:  1. What type of surgery is being performed? Colonoscopy  2. When is this surgery scheduled? Not scheduled   3. Are there any medications that need to be held prior to surgery and how long? Please advise!   4. Practice name and name of physician performing surgery? Dr Alessandra Bevels   5. What is your office phone and fax number? fax [phone] phone [phone]  6. Anesthesia type (None, local, MAC, general) ? unsure   [name] T 11/27/2017, 12:40 PM  _________________________________________________________________   (provider comments below)

## 2017-11-27 NOTE — Telephone Encounter (Signed)
[facility] name of practice

## 2017-12-01 ENCOUNTER — Encounter: Payer: Self-pay | Admitting: Adult Health

## 2017-12-01 NOTE — Progress Notes (Signed)
   Primary Cardiologist: No primary care provider on file.  Chart reviewed as part of pre-operative protocol coverage. Given past medical history and time since last visit, based on ACC/AHA guidelines, [name] would be at acceptable risk for the planned procedure without further cardiovascular testing.  She does not need to hold any medications.  I will route this recommendation to the requesting party via Epic fax function and remove from pre-op pool.  Please call with questions.  Jory Sims DNP  12/01/2017, 3:48 PM

## 2017-12-01 NOTE — Progress Notes (Signed)
Faxed via Epic to requesting provider 

## 2017-12-02 NOTE — Telephone Encounter (Signed)
   Primary Cardiologist: No primary care provider on file.  Chart reviewed as part of pre-operative protocol coverage. Given past medical history and time since last visit, based on ACC/AHA guidelines, [name] would be at acceptable risk for the planned procedure without further cardiovascular testing.  She does not need to hold any medications.  I will route this recommendation to the requesting party via Epic fax function and remove from pre-op pool.  Please call with questions.  Jory Sims DNP  12/01/2017, 3:48 PM

## 2017-12-02 NOTE — Telephone Encounter (Signed)
Faxed cardiac clearance via EPIC to requesting provider 

## 2017-12-14 DIAGNOSIS — S00411A Abrasion of right ear, initial encounter: Secondary | ICD-10-CM | POA: Diagnosis not present

## 2017-12-14 DIAGNOSIS — I480 Paroxysmal atrial fibrillation: Secondary | ICD-10-CM | POA: Diagnosis not present

## 2018-01-22 DIAGNOSIS — K648 Other hemorrhoids: Secondary | ICD-10-CM | POA: Diagnosis not present

## 2018-01-22 DIAGNOSIS — D126 Benign neoplasm of colon, unspecified: Secondary | ICD-10-CM | POA: Diagnosis not present

## 2018-01-22 DIAGNOSIS — K644 Residual hemorrhoidal skin tags: Secondary | ICD-10-CM | POA: Diagnosis not present

## 2018-01-22 DIAGNOSIS — K635 Polyp of colon: Secondary | ICD-10-CM | POA: Diagnosis not present

## 2018-01-22 DIAGNOSIS — K5730 Diverticulosis of large intestine without perforation or abscess without bleeding: Secondary | ICD-10-CM | POA: Diagnosis not present

## 2018-01-22 DIAGNOSIS — R195 Other fecal abnormalities: Secondary | ICD-10-CM | POA: Diagnosis not present

## 2018-01-26 DIAGNOSIS — K635 Polyp of colon: Secondary | ICD-10-CM | POA: Diagnosis not present

## 2018-01-26 DIAGNOSIS — D126 Benign neoplasm of colon, unspecified: Secondary | ICD-10-CM | POA: Diagnosis not present

## 2018-02-15 DIAGNOSIS — D126 Benign neoplasm of colon, unspecified: Secondary | ICD-10-CM | POA: Diagnosis not present

## 2018-02-15 DIAGNOSIS — K625 Hemorrhage of anus and rectum: Secondary | ICD-10-CM | POA: Diagnosis not present

## 2018-02-15 DIAGNOSIS — I480 Paroxysmal atrial fibrillation: Secondary | ICD-10-CM | POA: Diagnosis not present

## 2018-05-05 ENCOUNTER — Other Ambulatory Visit: Payer: Self-pay | Admitting: Cardiology

## 2018-06-14 DIAGNOSIS — N183 Chronic kidney disease, stage 3 (moderate): Secondary | ICD-10-CM | POA: Diagnosis not present

## 2018-06-14 DIAGNOSIS — Z0000 Encounter for general adult medical examination without abnormal findings: Secondary | ICD-10-CM | POA: Diagnosis not present

## 2018-06-14 DIAGNOSIS — R61 Generalized hyperhidrosis: Secondary | ICD-10-CM | POA: Diagnosis not present

## 2018-06-14 DIAGNOSIS — Z79899 Other long term (current) drug therapy: Secondary | ICD-10-CM | POA: Diagnosis not present

## 2018-06-14 DIAGNOSIS — Z1239 Encounter for other screening for malignant neoplasm of breast: Secondary | ICD-10-CM | POA: Diagnosis not present

## 2018-06-14 DIAGNOSIS — Z1389 Encounter for screening for other disorder: Secondary | ICD-10-CM | POA: Diagnosis not present

## 2018-06-14 DIAGNOSIS — I480 Paroxysmal atrial fibrillation: Secondary | ICD-10-CM | POA: Diagnosis not present

## 2018-06-14 DIAGNOSIS — F325 Major depressive disorder, single episode, in full remission: Secondary | ICD-10-CM | POA: Diagnosis not present

## 2018-06-14 DIAGNOSIS — R030 Elevated blood-pressure reading, without diagnosis of hypertension: Secondary | ICD-10-CM | POA: Diagnosis not present

## 2018-06-23 ENCOUNTER — Other Ambulatory Visit: Payer: Self-pay | Admitting: Geriatric Medicine

## 2018-06-23 DIAGNOSIS — Z1231 Encounter for screening mammogram for malignant neoplasm of breast: Secondary | ICD-10-CM

## 2018-07-01 DIAGNOSIS — I480 Paroxysmal atrial fibrillation: Secondary | ICD-10-CM | POA: Diagnosis not present

## 2018-07-12 DIAGNOSIS — D126 Benign neoplasm of colon, unspecified: Secondary | ICD-10-CM | POA: Diagnosis not present

## 2018-07-12 DIAGNOSIS — I4891 Unspecified atrial fibrillation: Secondary | ICD-10-CM | POA: Diagnosis not present

## 2018-07-12 DIAGNOSIS — D122 Benign neoplasm of ascending colon: Secondary | ICD-10-CM | POA: Diagnosis not present

## 2018-07-12 DIAGNOSIS — K219 Gastro-esophageal reflux disease without esophagitis: Secondary | ICD-10-CM | POA: Diagnosis not present

## 2018-07-12 DIAGNOSIS — I10 Essential (primary) hypertension: Secondary | ICD-10-CM | POA: Diagnosis not present

## 2018-07-12 DIAGNOSIS — E785 Hyperlipidemia, unspecified: Secondary | ICD-10-CM | POA: Diagnosis not present

## 2018-07-12 DIAGNOSIS — K5730 Diverticulosis of large intestine without perforation or abscess without bleeding: Secondary | ICD-10-CM | POA: Diagnosis not present

## 2018-07-12 DIAGNOSIS — E669 Obesity, unspecified: Secondary | ICD-10-CM | POA: Diagnosis not present

## 2018-07-13 ENCOUNTER — Other Ambulatory Visit: Payer: Self-pay

## 2018-07-13 ENCOUNTER — Inpatient Hospital Stay (HOSPITAL_COMMUNITY): Payer: Medicare Other

## 2018-07-13 ENCOUNTER — Inpatient Hospital Stay (HOSPITAL_COMMUNITY)
Admission: EM | Admit: 2018-07-13 | Discharge: 2018-07-19 | DRG: 064 | Disposition: A | Discharge status: Rehabilitation Facility | Payer: Medicare Other | Attending: Neurology | Admitting: Neurology

## 2018-07-13 ENCOUNTER — Emergency Department (HOSPITAL_COMMUNITY): Payer: Medicare Other

## 2018-07-13 ENCOUNTER — Encounter (HOSPITAL_COMMUNITY): Payer: Self-pay | Admitting: Emergency Medicine

## 2018-07-13 DIAGNOSIS — I10 Essential (primary) hypertension: Secondary | ICD-10-CM | POA: Diagnosis not present

## 2018-07-13 DIAGNOSIS — I629 Nontraumatic intracranial hemorrhage, unspecified: Secondary | ICD-10-CM | POA: Diagnosis not present

## 2018-07-13 DIAGNOSIS — I480 Paroxysmal atrial fibrillation: Secondary | ICD-10-CM | POA: Diagnosis not present

## 2018-07-13 DIAGNOSIS — R402142 Coma scale, eyes open, spontaneous, at arrival to emergency department: Secondary | ICD-10-CM | POA: Diagnosis present

## 2018-07-13 DIAGNOSIS — I161 Hypertensive emergency: Secondary | ICD-10-CM | POA: Diagnosis present

## 2018-07-13 DIAGNOSIS — D72829 Elevated white blood cell count, unspecified: Secondary | ICD-10-CM

## 2018-07-13 DIAGNOSIS — I959 Hypotension, unspecified: Secondary | ICD-10-CM | POA: Diagnosis not present

## 2018-07-13 DIAGNOSIS — I615 Nontraumatic intracerebral hemorrhage, intraventricular: Secondary | ICD-10-CM | POA: Diagnosis not present

## 2018-07-13 DIAGNOSIS — Z7982 Long term (current) use of aspirin: Secondary | ICD-10-CM

## 2018-07-13 DIAGNOSIS — F320 Major depressive disorder, single episode, mild: Secondary | ICD-10-CM | POA: Diagnosis not present

## 2018-07-13 DIAGNOSIS — K219 Gastro-esophageal reflux disease without esophagitis: Secondary | ICD-10-CM | POA: Diagnosis present

## 2018-07-13 DIAGNOSIS — Z79899 Other long term (current) drug therapy: Secondary | ICD-10-CM | POA: Diagnosis not present

## 2018-07-13 DIAGNOSIS — I613 Nontraumatic intracerebral hemorrhage in brain stem: Secondary | ICD-10-CM | POA: Diagnosis not present

## 2018-07-13 DIAGNOSIS — F17200 Nicotine dependence, unspecified, uncomplicated: Secondary | ICD-10-CM | POA: Diagnosis not present

## 2018-07-13 DIAGNOSIS — R339 Retention of urine, unspecified: Secondary | ICD-10-CM

## 2018-07-13 DIAGNOSIS — Z880 Allergy status to penicillin: Secondary | ICD-10-CM | POA: Diagnosis not present

## 2018-07-13 DIAGNOSIS — B9620 Unspecified Escherichia coli [E. coli] as the cause of diseases classified elsewhere: Secondary | ICD-10-CM | POA: Diagnosis present

## 2018-07-13 DIAGNOSIS — I611 Nontraumatic intracerebral hemorrhage in hemisphere, cortical: Secondary | ICD-10-CM | POA: Diagnosis not present

## 2018-07-13 DIAGNOSIS — H53462 Homonymous bilateral field defects, left side: Secondary | ICD-10-CM | POA: Diagnosis present

## 2018-07-13 DIAGNOSIS — E669 Obesity, unspecified: Secondary | ICD-10-CM | POA: Diagnosis present

## 2018-07-13 DIAGNOSIS — R29705 NIHSS score 5: Secondary | ICD-10-CM | POA: Diagnosis present

## 2018-07-13 DIAGNOSIS — R402252 Coma scale, best verbal response, oriented, at arrival to emergency department: Secondary | ICD-10-CM | POA: Diagnosis present

## 2018-07-13 DIAGNOSIS — R402362 Coma scale, best motor response, obeys commands, at arrival to emergency department: Secondary | ICD-10-CM | POA: Diagnosis present

## 2018-07-13 DIAGNOSIS — I609 Nontraumatic subarachnoid hemorrhage, unspecified: Secondary | ICD-10-CM | POA: Diagnosis not present

## 2018-07-13 DIAGNOSIS — G936 Cerebral edema: Secondary | ICD-10-CM | POA: Diagnosis present

## 2018-07-13 DIAGNOSIS — Z634 Disappearance and death of family member: Secondary | ICD-10-CM | POA: Diagnosis not present

## 2018-07-13 DIAGNOSIS — R001 Bradycardia, unspecified: Secondary | ICD-10-CM | POA: Diagnosis present

## 2018-07-13 DIAGNOSIS — F17210 Nicotine dependence, cigarettes, uncomplicated: Secondary | ICD-10-CM | POA: Diagnosis present

## 2018-07-13 DIAGNOSIS — G8190 Hemiplegia, unspecified affecting unspecified side: Secondary | ICD-10-CM | POA: Diagnosis not present

## 2018-07-13 DIAGNOSIS — H5347 Heteronymous bilateral field defects: Secondary | ICD-10-CM | POA: Diagnosis present

## 2018-07-13 DIAGNOSIS — Z6831 Body mass index (BMI) 31.0-31.9, adult: Secondary | ICD-10-CM

## 2018-07-13 DIAGNOSIS — F329 Major depressive disorder, single episode, unspecified: Secondary | ICD-10-CM | POA: Diagnosis present

## 2018-07-13 DIAGNOSIS — E785 Hyperlipidemia, unspecified: Secondary | ICD-10-CM | POA: Diagnosis present

## 2018-07-13 DIAGNOSIS — I69254 Hemiplegia and hemiparesis following other nontraumatic intracranial hemorrhage affecting left non-dominant side: Secondary | ICD-10-CM | POA: Diagnosis present

## 2018-07-13 DIAGNOSIS — I639 Cerebral infarction, unspecified: Secondary | ICD-10-CM | POA: Diagnosis not present

## 2018-07-13 DIAGNOSIS — E876 Hypokalemia: Secondary | ICD-10-CM | POA: Diagnosis present

## 2018-07-13 DIAGNOSIS — R51 Headache: Secondary | ICD-10-CM | POA: Diagnosis not present

## 2018-07-13 DIAGNOSIS — K5900 Constipation, unspecified: Secondary | ICD-10-CM | POA: Diagnosis not present

## 2018-07-13 DIAGNOSIS — I619 Nontraumatic intracerebral hemorrhage, unspecified: Principal | ICD-10-CM | POA: Diagnosis present

## 2018-07-13 DIAGNOSIS — K635 Polyp of colon: Secondary | ICD-10-CM | POA: Diagnosis present

## 2018-07-13 DIAGNOSIS — R9431 Abnormal electrocardiogram [ECG] [EKG]: Secondary | ICD-10-CM | POA: Diagnosis not present

## 2018-07-13 DIAGNOSIS — G8110 Spastic hemiplegia affecting unspecified side: Secondary | ICD-10-CM | POA: Diagnosis not present

## 2018-07-13 DIAGNOSIS — I340 Nonrheumatic mitral (valve) insufficiency: Secondary | ICD-10-CM | POA: Diagnosis not present

## 2018-07-13 DIAGNOSIS — I951 Orthostatic hypotension: Secondary | ICD-10-CM | POA: Diagnosis not present

## 2018-07-13 DIAGNOSIS — Z91048 Other nonmedicinal substance allergy status: Secondary | ICD-10-CM

## 2018-07-13 DIAGNOSIS — Z823 Family history of stroke: Secondary | ICD-10-CM | POA: Diagnosis not present

## 2018-07-13 DIAGNOSIS — I69292 Facial weakness following other nontraumatic intracranial hemorrhage: Secondary | ICD-10-CM | POA: Diagnosis not present

## 2018-07-13 DIAGNOSIS — M25531 Pain in right wrist: Secondary | ICD-10-CM | POA: Diagnosis not present

## 2018-07-13 DIAGNOSIS — R531 Weakness: Secondary | ICD-10-CM | POA: Diagnosis not present

## 2018-07-13 DIAGNOSIS — R0902 Hypoxemia: Secondary | ICD-10-CM | POA: Diagnosis not present

## 2018-07-13 DIAGNOSIS — W050XXA Fall from non-moving wheelchair, initial encounter: Secondary | ICD-10-CM | POA: Diagnosis not present

## 2018-07-13 DIAGNOSIS — Z23 Encounter for immunization: Secondary | ICD-10-CM | POA: Diagnosis present

## 2018-07-13 DIAGNOSIS — G8194 Hemiplegia, unspecified affecting left nondominant side: Secondary | ICD-10-CM | POA: Diagnosis present

## 2018-07-13 DIAGNOSIS — R414 Neurologic neglect syndrome: Secondary | ICD-10-CM | POA: Diagnosis not present

## 2018-07-13 DIAGNOSIS — I612 Nontraumatic intracerebral hemorrhage in hemisphere, unspecified: Secondary | ICD-10-CM | POA: Diagnosis not present

## 2018-07-13 LAB — COMPREHENSIVE METABOLIC PANEL
ALBUMIN: 4.1 g/dL (ref 3.5–5.0)
ALT: 19 U/L (ref 0–44)
AST: 18 U/L (ref 15–41)
Alkaline Phosphatase: 82 U/L (ref 38–126)
Anion gap: 12 (ref 5–15)
BUN: 17 mg/dL (ref 8–23)
CHLORIDE: 101 mmol/L (ref 98–111)
CO2: 27 mmol/L (ref 22–32)
Calcium: 10 mg/dL (ref 8.9–10.3)
Creatinine, Ser: 1.05 mg/dL — ABNORMAL HIGH (ref 0.44–1.00)
GFR calc Af Amer: >60 mL/min (ref >60)
GFR, EST NON AFRICAN AMERICAN: 53 mL/min — AB (ref >60)
GLUCOSE: 131 mg/dL — AB (ref 70–99)
POTASSIUM: 3.4 mmol/L — AB (ref 3.5–5.1)
Sodium: 140 mmol/L (ref 135–145)
Total Bilirubin: 1 mg/dL (ref 0.3–1.2)
Total Protein: 6.6 g/dL (ref 6.5–8.1)

## 2018-07-13 LAB — CBC WITH DIFFERENTIAL/PLATELET
ABS IMMATURE GRANULOCYTES: 0.1 10*3/uL (ref 0.0–0.1)
Basophils Absolute: 0.1 10*3/uL (ref 0.0–0.1)
Basophils Relative: 0 %
EOS PCT: 0 %
Eosinophils Absolute: 0 10*3/uL (ref 0.0–0.7)
HEMATOCRIT: 48.2 % — AB (ref 36.0–46.0)
HEMOGLOBIN: 16.2 g/dL — AB (ref 12.0–15.0)
Immature Granulocytes: 1 %
LYMPHS ABS: 1 10*3/uL (ref 0.7–4.0)
LYMPHS PCT: 5 %
MCH: 29.7 pg (ref 26.0–34.0)
MCHC: 33.6 g/dL (ref 30.0–36.0)
MCV: 88.3 fL (ref 78.0–100.0)
Monocytes Absolute: 1.5 10*3/uL — ABNORMAL HIGH (ref 0.1–1.0)
Monocytes Relative: 7 %
NEUTROS ABS: 19.9 10*3/uL — AB (ref 1.7–7.7)
Neutrophils Relative %: 87 %
Platelets: 249 10*3/uL (ref 150–400)
RBC: 5.46 MIL/uL — AB (ref 3.87–5.11)
RDW: 13 % (ref 11.5–15.5)
WBC: 22.5 10*3/uL — ABNORMAL HIGH (ref 4.0–10.5)

## 2018-07-13 LAB — I-STAT CHEM 8, ED
BUN: 19 mg/dL (ref 8–23)
CREATININE: 0.9 mg/dL (ref 0.44–1.00)
Calcium, Ion: 1.17 mmol/L (ref 1.15–1.40)
Chloride: 100 mmol/L (ref 98–111)
Glucose, Bld: 131 mg/dL — ABNORMAL HIGH (ref 70–99)
HEMATOCRIT: 44 % (ref 36.0–46.0)
HEMOGLOBIN: 15 g/dL (ref 12.0–15.0)
POTASSIUM: 3.3 mmol/L — AB (ref 3.5–5.1)
Sodium: 140 mmol/L (ref 135–145)
TCO2: 30 mmol/L (ref 22–32)

## 2018-07-13 LAB — CBC
HCT: 44.8 % (ref 36.0–46.0)
Hemoglobin: 15.2 g/dL — ABNORMAL HIGH (ref 12.0–15.0)
MCH: 30.2 pg (ref 26.0–34.0)
MCHC: 33.9 g/dL (ref 30.0–36.0)
MCV: 88.9 fL (ref 78.0–100.0)
PLATELETS: 220 10*3/uL (ref 150–400)
RBC: 5.04 MIL/uL (ref 3.87–5.11)
RDW: 12.9 % (ref 11.5–15.5)
WBC: 19.1 10*3/uL — AB (ref 4.0–10.5)

## 2018-07-13 LAB — DIFFERENTIAL
ABS IMMATURE GRANULOCYTES: 0.1 10*3/uL (ref 0.0–0.1)
Basophils Absolute: 0 10*3/uL (ref 0.0–0.1)
Basophils Relative: 0 %
EOS ABS: 0 10*3/uL (ref 0.0–0.7)
Eosinophils Relative: 0 %
IMMATURE GRANULOCYTES: 1 %
Lymphocytes Relative: 5 %
Lymphs Abs: 1 10*3/uL (ref 0.7–4.0)
Monocytes Absolute: 1.4 10*3/uL — ABNORMAL HIGH (ref 0.1–1.0)
Monocytes Relative: 7 %
NEUTROS PCT: 87 %
Neutro Abs: 16.5 10*3/uL — ABNORMAL HIGH (ref 1.7–7.7)

## 2018-07-13 LAB — APTT: aPTT: 32 seconds (ref 24–36)

## 2018-07-13 LAB — PROTIME-INR
INR: 1.08
PROTHROMBIN TIME: 13.9 s (ref 11.4–15.2)

## 2018-07-13 LAB — I-STAT TROPONIN, ED: TROPONIN I, POC: 0.02 ng/mL (ref 0.00–0.08)

## 2018-07-13 LAB — MRSA PCR SCREENING: MRSA BY PCR: NEGATIVE

## 2018-07-13 MED ORDER — FLECAINIDE ACETATE 50 MG PO TABS: 50.0000 mg | ORAL_TABLET | ORAL | Status: DC

## 2018-07-13 MED ORDER — ACETAMINOPHEN 325 MG PO TABS
650.0000 mg | ORAL_TABLET | ORAL | Status: DC | PRN
  Administered 2018-07-13 – 2018-07-17 (×20): 650 mg via ORAL
  Filled 2018-07-13 (×20): qty 2

## 2018-07-13 MED ORDER — METOPROLOL TARTRATE 25 MG PO TABS
25.0000 mg | ORAL_TABLET | Freq: Two times a day (BID) | ORAL | Status: DC
  Administered 2018-07-13 – 2018-07-17 (×8): 25 mg via ORAL
  Filled 2018-07-13 (×8): qty 1

## 2018-07-13 MED ORDER — NICARDIPINE HCL IN NACL 20-0.86 MG/200ML-% IV SOLN
0.0000 mg/h | INTRAVENOUS | Status: DC
  Administered 2018-07-13: 8 mg/h via INTRAVENOUS
  Administered 2018-07-13: 5 mg/h via INTRAVENOUS
  Administered 2018-07-13 (×2): 8 mg/h via INTRAVENOUS
  Administered 2018-07-13: 15 mg/h via INTRAVENOUS
  Administered 2018-07-13: 8 mg/h via INTRAVENOUS
  Administered 2018-07-14: 3 mg/h via INTRAVENOUS
  Administered 2018-07-14: 7 mg/h via INTRAVENOUS
  Administered 2018-07-14 (×3): 8 mg/h via INTRAVENOUS
  Administered 2018-07-15 (×3): 5 mg/h via INTRAVENOUS
  Filled 2018-07-13 (×3): qty 200
  Filled 2018-07-13 (×4): qty 400
  Filled 2018-07-13: qty 200
  Filled 2018-07-13 (×2): qty 400

## 2018-07-13 MED ORDER — SERTRALINE HCL 50 MG PO TABS
100.0000 mg | ORAL_TABLET | Freq: Every day | ORAL | Status: DC
  Administered 2018-07-14: 50 mg via ORAL
  Administered 2018-07-15: 100 mg via ORAL
  Administered 2018-07-16: 50 mg via ORAL
  Filled 2018-07-13 (×3): qty 2

## 2018-07-13 MED ORDER — ONDANSETRON HCL 4 MG/2ML IJ SOLN
4.0000 mg | Freq: Three times a day (TID) | INTRAMUSCULAR | Status: DC | PRN
  Administered 2018-07-13 – 2018-07-15 (×2): 4 mg via INTRAVENOUS
  Filled 2018-07-13 (×3): qty 2

## 2018-07-13 MED ORDER — ACETAMINOPHEN 160 MG/5ML PO SOLN: 650.0000 mg | ORAL | Status: DC | PRN

## 2018-07-13 MED ORDER — PANTOPRAZOLE SODIUM 40 MG PO TBEC
40.0000 mg | DELAYED_RELEASE_TABLET | Freq: Every day | ORAL | Status: DC
  Administered 2018-07-14 – 2018-07-19 (×6): 40 mg via ORAL
  Filled 2018-07-13 (×6): qty 1

## 2018-07-13 MED ORDER — ORAL CARE MOUTH RINSE
15.0000 mL | Freq: Two times a day (BID) | OROMUCOSAL | Status: DC
  Administered 2018-07-13 – 2018-07-19 (×10): 15 mL via OROMUCOSAL

## 2018-07-13 MED ORDER — FLECAINIDE ACETATE 100 MG PO TABS
100.0000 mg | ORAL_TABLET | Freq: Every day | ORAL | Status: DC
  Administered 2018-07-14 – 2018-07-18 (×5): 100 mg via ORAL
  Filled 2018-07-13 (×7): qty 1

## 2018-07-13 MED ORDER — ACETAMINOPHEN 650 MG RE SUPP: 650.0000 mg | RECTAL | Status: DC | PRN

## 2018-07-13 MED ORDER — SENNOSIDES-DOCUSATE SODIUM 8.6-50 MG PO TABS
1.0000 | ORAL_TABLET | Freq: Two times a day (BID) | ORAL | Status: DC
  Administered 2018-07-13 – 2018-07-19 (×8): 1 via ORAL
  Filled 2018-07-13 (×8): qty 1

## 2018-07-13 MED ORDER — STROKE: EARLY STAGES OF RECOVERY BOOK
Freq: Once | Status: AC
  Administered 2018-07-13: 18:00:00
  Filled 2018-07-13: qty 1

## 2018-07-13 MED ORDER — NICARDIPINE HCL IN NACL 20-0.86 MG/200ML-% IV SOLN
INTRAVENOUS | Status: AC
  Filled 2018-07-13: qty 200

## 2018-07-13 MED ORDER — FLECAINIDE ACETATE 50 MG PO TABS
50.0000 mg | ORAL_TABLET | Freq: Every day | ORAL | Status: DC
  Administered 2018-07-13 – 2018-07-18 (×6): 50 mg via ORAL
  Filled 2018-07-13 (×7): qty 1

## 2018-07-13 MED ORDER — SERTRALINE HCL 50 MG PO TABS: 100.0000 mg | ORAL_TABLET | Freq: Every day | ORAL | Status: DC

## 2018-07-13 NOTE — ED Notes (Signed)
Unable to give report over the phone to RN, will give bedside report.

## 2018-07-13 NOTE — ED Provider Notes (Signed)
[facility] EMERGENCY DEPARTMENT Provider Note   CSN: [id number] Arrival date & time: 07/13/18  1017     History   Chief Complaint Chief Complaint  Patient presents with  . Cerebrovascular Accident    HPI [name] is a 72 y.o. female.  The history is provided by the patient, medical records and a relative. No language interpreter was used.  Neurologic Problem  This is a new problem. The current episode started 12 to 24 hours ago. The problem occurs constantly. The problem has been gradually worsening. Associated symptoms include headaches. Pertinent negatives include no chest pain, no abdominal pain and no shortness of breath. Nothing aggravates the symptoms. Nothing relieves the symptoms. She has tried nothing for the symptoms. The treatment provided no relief.    Past Medical History:  Diagnosis Date  . Dyslipidemia   . GERD (gastroesophageal reflux disease)   . High risk medication use    on Flecainide since January of 2013  . Hypertension   . Obesity   . PAF (paroxysmal atrial fibrillation) (HCC)    normal stress echo in 2011    Patient Active Problem List   Diagnosis Date Noted  . Hypertension 07/06/2012  . Paroxysmal atrial fibrillation ([place]) 11/27/2011  . DIARRHEA, ACUTE 05/31/2009  . GERD 03/06/2009  . BACK PAIN 03/06/2009  . ANXIETY 06/12/2007  . DEPRESSION 06/12/2007  . HEADACHE 06/12/2007    Past Surgical History:  Procedure Laterality Date  . +    . ABDOMINAL HYSTERECTOMY    . ANKLE SURGERY    . CARDIOVASCULAR STRESS TEST  07/21/2007   EF 80%, NORMAL NONDIAGONISTIC FOR ISCHEMIA  . STRESS ECHO TEST  07/25/2010   EF 60%  . WRIST SURGERY     RIGHT WRIST     OB History   None      Home Medications    Prior to Admission medications   Medication Sig Start Date End Date Taking? Authorizing Provider  acetaminophen (TYLENOL) 500 MG tablet Take 500 mg by mouth every 6 (six) hours as needed for mild pain.    [provider]  aspirin 81 MG tablet Take 81 mg by mouth every evening.     [provider]  cimetidine (TAGAMET) 400 MG tablet Take 400 mg by mouth 2 (two) times daily.    [provider]  ciprofloxacin (CIPRO) 500 MG tablet Take 1 tablet (500 mg total) by mouth 2 (two) times daily. 10/22/17   Kinnie Feil, PA-C  flecainide (TAMBOCOR) 50 MG tablet TAKE 2 TABLETS EVERY MORNING AND 1 TABLET IN THE EVENING 05/05/18   [name], Peter M, MD  HYDROcodone-acetaminophen (NORCO/VICODIN) 5-325 MG per tablet Take 1-2 tablets by mouth every 4 (four) hours as needed for moderate pain or severe pain. Patient not taking: Reported on 10/21/2017 09/07/14   Linton Flemings, MD  metoprolol tartrate (LOPRESSOR) 25 MG tablet TAKE 1 TABLET BY MOUTH TWICE A DAY 05/05/18   [name], Peter M, MD  nitroGLYCERIN (NITROSTAT) 0.4 MG SL tablet Place 1 tablet (0.4 mg total) under the tongue every 5 (five) minutes as needed for chest pain (x 3 doses). 06/20/16   Theora Gianotti, NP  ondansetron (ZOFRAN ODT) 4 MG disintegrating tablet Take 1 tablet (4 mg total) by mouth every 8 (eight) hours as needed for nausea or vomiting. 10/22/17   Kinnie Feil, PA-C  sertraline (ZOLOFT) 50 MG tablet Take 50 mg by mouth daily.     [provider]  Family History Family History  Problem Relation Age of Onset  . Alzheimer's disease Mother   . Heart disease Father   . Stroke Sister   . Lung cancer Brother   . Cancer Brother     Social History Social History   Tobacco Use  . Smoking status: Current Every Day Smoker  . Smokeless tobacco: Never Used  Substance Use Topics  . Alcohol use: No  . Drug use: No     Allergies   Penicillins and Tape   Review of Systems Review of Systems  Constitutional: Negative for chills, diaphoresis, fatigue and fever.  HENT: Negative for congestion.   Eyes: Positive for visual disturbance.  Respiratory: Negative for cough, chest tightness and shortness of  breath.   Cardiovascular: Negative for chest pain and palpitations.  Gastrointestinal: Negative for abdominal pain, diarrhea, nausea and vomiting.  Genitourinary: Negative for flank pain.  Musculoskeletal: Negative for back pain, neck pain and neck stiffness.  Skin: Negative for rash and wound.  Neurological: Positive for weakness, numbness and headaches. Negative for dizziness, syncope, speech difficulty and light-headedness.  Psychiatric/Behavioral: Negative for agitation.  All other systems reviewed and are negative.    Physical Exam Updated Vital Signs BP (!) 162/55 (BP Location: Right Arm)   Pulse (!) 59   Resp 18   SpO2 97%   Physical Exam  Constitutional: She is oriented to person, place, and time. She appears well-developed and well-nourished. No distress.  HENT:  Head: Normocephalic.  Nose: Nose normal.  Mouth/Throat: Oropharynx is clear and moist. No oropharyngeal exudate.  Eyes: Pupils are equal, round, and reactive to light. Conjunctivae and EOM are normal. Right eye exhibits normal extraocular motion. Left eye exhibits normal extraocular motion. Pupils are equal.  Decreased visual fields on left side in both eyes.  Normal extraocular movement.  Pupils are symmetric and reactive.  Neck: Normal range of motion.  Cardiovascular: Normal rate and intact distal pulses.  No murmur heard. Pulmonary/Chest: Effort normal. No respiratory distress. She has no wheezes. She has no rales. She exhibits no tenderness.  Abdominal: She exhibits no distension. There is no tenderness.  Musculoskeletal: She exhibits no edema or tenderness.  Neurological: She is alert and oriented to person, place, and time. She is not disoriented. She displays no tremor. A cranial nerve deficit and sensory deficit is present. She exhibits abnormal muscle tone. Coordination normal. GCS eye subscore is 4. GCS verbal subscore is 5. GCS motor subscore is 6.  Numbness and weakness in left arm and left leg.    Skin: Capillary refill takes less than 2 seconds. No rash noted. She is not diaphoretic. No erythema.  Psychiatric: She has a normal mood and affect.  Nursing note and vitals reviewed.    ED Treatments / Results  Labs (all labs ordered are listed, but only abnormal results are displayed) Labs Reviewed  CBC - Abnormal; Notable for the following components:      Result Value   WBC 19.1 (*)    Hemoglobin 15.2 (*)    All other components within normal limits  DIFFERENTIAL - Abnormal; Notable for the following components:   Neutro Abs 16.5 (*)    Monocytes Absolute 1.4 (*)    All other components within normal limits  COMPREHENSIVE METABOLIC PANEL - Abnormal; Notable for the following components:   Potassium 3.4 (*)    Glucose, Bld 131 (*)    Creatinine, Ser 1.05 (*)    GFR calc non Af Amer 53 (*)  All other components within normal limits  I-STAT CHEM 8, ED - Abnormal; Notable for the following components:   Potassium 3.3 (*)    Glucose, Bld 131 (*)    All other components within normal limits  URINE CULTURE  PROTIME-INR  APTT  URINALYSIS, ROUTINE W REFLEX MICROSCOPIC  HIV ANTIBODY (ROUTINE TESTING)  I-STAT TROPONIN, ED    EKG EKG Interpretation  Date/Time:  Tuesday July 13 2018 09:11:24 EDT Ventricular Rate:  61 PR Interval:    QRS Duration: 99 QT Interval:  644 QTC Calculation: 649 R Axis:   48 Text Interpretation:  Sinus rhythm Short PR interval Probable anteroseptal infarct, old Prolonged QT interval When comapred to prior, longer QTc.  No STEMI Confirmed by Antony Blackbird [phone]) on 07/13/2018 10:54:52 AM   Radiology Ct Head Wo Contrast  Result Date: 07/13/2018 CLINICAL DATA:  Focal neuro deficit. Right parietal headache for 2-3 days. EXAM: CT HEAD WITHOUT CONTRAST TECHNIQUE: Contiguous axial images were obtained from the base of the skull through the vertex without intravenous contrast. COMPARISON:  None. FINDINGS: Brain: 4.7 x 4.2 x 5.3 cm high-density  hematoma centered in the right parietal lobe with a thin rim of edema. Shape is irregular, best estimate at volume is 50 cc. No gross underlying lesion. No low-density separate from the hematoma to suggest primary infarct. The sagittal sinuses have normal density. No trauma history. No indication of chronic small vessel disease. There is small volume regional subarachnoid hemorrhage. Small volume of subdural blood is seen along the right frontal convexity measuring up to 4 mm thickness. There is local mass effect without hydrocephalus or entrapment. Vascular: No hyperdense vessel or unexpected calcification. Skull: Negative for fracture Sinuses/Orbits: Negative Other: Critical Value/emergent results were called by telephone at the time of interpretation on 07/13/2018 at 9:43 am to Dr. Marda Stalker , who verbally acknowledged these results. IMPRESSION: 50 cc acute hematoma centered in the right parietal lobe. There is small volume local subarachnoid and subdural extension. Local mass effect without hydrocephalus or midline shift. No detectable underlying cause. Electronically Signed   By: Monte Fantasia M.D.   On: 07/13/2018 09:46    Procedures Procedures (including critical care time)  CRITICAL CARE Performed by: Gwenyth Allegra Cherry Wittwer Total critical care time: 45 minutes Critical care time was exclusive of separately billable procedures and treating other patients. Intracranial hemorrhage requiring Cardene drip for elevated blood pressure.  The mid to ICU. Critical care was necessary to treat or prevent imminent or life-threatening deterioration. Critical care was time spent personally by me on the following activities: development of treatment plan with patient and/or surrogate as well as nursing, discussions with consultants, evaluation of patient's response to treatment, examination of patient, obtaining history from patient or surrogate, ordering and performing treatments and interventions,  ordering and review of laboratory studies, ordering and review of radiographic studies, pulse oximetry and re-evaluation of patient's condition.   Medications Ordered in ED Medications  nicardipine (CARDENE) 20mg  in 0.86% saline 273ml IV infusion (0.1 mg/ml) (12 mg/hr Intravenous Rate/Dose Change 07/13/18 1051)   stroke: mapping our early stages of recovery book (has no administration in time range)  acetaminophen (TYLENOL) tablet 650 mg (has no administration in time range)    Or  acetaminophen (TYLENOL) solution 650 mg (has no administration in time range)    Or  acetaminophen (TYLENOL) suppository 650 mg (has no administration in time range)  senna-docusate (Senokot-S) tablet 1 tablet (1 tablet Oral Given 07/13/18 1050)  ondansetron (ZOFRAN) injection 4 mg (  has no administration in time range)     Initial Impression / Assessment and Plan / ED Course  I have reviewed the triage vital signs and the nursing notes.  Pertinent labs & imaging results that were available during my care of the patient were reviewed by me and considered in my medical decision making (see chart for details).     STAPHANY DITTON is a 72 y.o. female with a past medical history significant for anxiety, depression, excisional atrial fibrillation not on anticoagulation and hypertension who recently had a colonoscopy yesterday who presents with right-sided headache, left arm and left leg weakness.  Patient says that she started having a headache 2 days ago in her right occiput.  She reports it was mild initially.  She says that she took a bowel prep for colonoscopy yesterday.  She says that there were no comp occasions initially however when she was waking up from anesthesia she was very sleepy and had left arm and left leg weakness.  She thought this was likely due to anesthesia and was discharged home.  Patient says that throughout the night and into today she continued to have left arm and left leg weakness that has  worsened.  She denied nausea or vomiting but does report the headache is moderate to severe currently.  It was 8 out of 10 on my initial evaluation.  She denies any fevers, chills, neck pain, neck stiffness, recent trauma.  She denies any abdominal pain, conservation, diarrhea or other symptoms aside from some sleepiness.  She reports she did not take her aspirin yesterday or today.  On exam, patient found to have left visual field deficits.  Patient had numbness in her left arm and left leg as well as weakness in her left arm and left leg.  No abnormality with extraocular movements.  No facial numbness or facial droop.  Patient had clear speech.  Lungs clear chest nontender.  Abdomen nontender.  Clinical I am concerned patient may have had a stroke.  Given the headache involved, patient will have immediate head CT to look for hemorrhagic stroke.  CT scan showed evidence of intracranial hemorrhage.  There did not appear to be midline shift.  Imaging appears to show an intraparenchymal hemorrhage.  Neurology and neurosurgery were both called.  Neurology will see the patient and admit for further management.  Neurosurgery will see in consultation.      Patient was started immediately on a Cardene drip and her blood pressure was in the 160s and 170s initially.  Blood pressure has been reduced successfully.  11:04 AM Patient initial EKG showed prolonged QTC.  Due to the need for nausea medications occasionally, it was repeated.  Patient's EKG showed ST depressions diffusely.  Given her lack of chest pain or shortness breath I suspect these are changes due to the intracranial hemorrhage.  Patient will be monitored.  No STEMI.  Patient will be admitted for further management.   Final Clinical Impressions(s) / ED Diagnoses   Final diagnoses:  Acute CVA (cerebrovascular accident) [facility])  Right-sided nontraumatic intracerebral hemorrhage, unspecified cerebral location [facility])    ED Discharge Orders      None      Clinical Impression: 1. Acute CVA (cerebrovascular accident) ([place])   2. Right-sided nontraumatic intracerebral hemorrhage, unspecified cerebral location [facility])     Disposition: Admit  This note was prepared with assistance of Dragon voice recognition software. Occasional wrong-word or sound-a-like substitutions may have occurred due to the inherent limitations of  voice recognition software.      Yashua Bracco, Gwenyth Allegra, MD 07/13/18 1630

## 2018-07-13 NOTE — ED Triage Notes (Signed)
Pt arrives via EMs from home. Reports had coloscopy yesterday at [facility] with full anesthesia. Recovered, LSN 1pm. Family noted left sided weakness beginning yesterday evening. Today unable to make use of left arm or ambulate. Noted to have left sided weakness. Neg facial droop, left arm weakness. Pt reports decreased sensation on the left side of her body. Pt concious, alert, oriented x4, VSS.

## 2018-07-13 NOTE — Progress Notes (Signed)
Patient has not voided since this am in the ED. Bladder scan showed 392cc. Patient not in any discomfort and stated she did not need to void at this time. Dr Aroor was notified. Waiting on response from him at this time.

## 2018-07-13 NOTE — Progress Notes (Signed)
PT Cancellation Note  Patient Details Name: [name] MRN: [id number] DOB: 1946-03-01   Cancelled Treatment:    Reason Eval/Treat Not Completed: Patient not medically ready. Noted pt placed on bedrest today, please advance activity orders when pt medically stable for PT/ OT.   [place] 07/13/2018, 2:22 PM

## 2018-07-13 NOTE — Consult Note (Signed)
Reason for Consult: Intracerebral hematoma Referring Physician: Emergency department  [name] is an 72 y.o. female.  HPI: 72 year old female awake and slowly following colonoscopy yesterday.  Patient with progressive headache and some left-sided weakness.  Patient also with some nausea vomiting.  Presented to emergency department today.  Head CT scan demonstrates evidence of a right-sided lobar hypertensive hemorrhage in her right parietal lobe.  Nothing to suggest neoplasm or vascular malformation at this point.  Past Medical History:  Diagnosis Date  . Dyslipidemia   . GERD (gastroesophageal reflux disease)   . High risk medication use    on Flecainide since January of 2013  . Hypertension   . Obesity   . PAF (paroxysmal atrial fibrillation) (HCC)    normal stress echo in 2011    Past Surgical History:  Procedure Laterality Date  . +    . ABDOMINAL HYSTERECTOMY    . ANKLE SURGERY    . CARDIOVASCULAR STRESS TEST  07/21/2007   EF 80%, NORMAL NONDIAGONISTIC FOR ISCHEMIA  . STRESS ECHO TEST  07/25/2010   EF 60%  . WRIST SURGERY     RIGHT WRIST    Family History  Problem Relation Age of Onset  . Alzheimer's disease Mother   . Heart disease Father   . Stroke Sister   . Lung cancer Brother   . Cancer Brother     Social History:  reports that she has been smoking. She has never used smokeless tobacco. She reports that she does not drink alcohol or use drugs.  Allergies:  Allergies  Allergen Reactions  . Penicillins     Passed out Has patient had a PCN reaction causing immediate rash, facial/tongue/throat swelling, SOB or lightheadedness with hypotension: Yes Has patient had a PCN reaction causing severe rash involving mucus membranes or skin necrosis: No Has patient had a PCN reaction that required hospitalization: No Has patient had a PCN reaction occurring within the last 10 years: No If all of the above answers are "NO", then may proceed with Cephalosporin  use.   . Tape Rash    Medical tape did this    Medications: I have reviewed the patient's current medications.  Results for orders placed or performed during the hospital encounter of 07/13/18 (from the past 48 hour(s))  Protime-INR     Status: None   Collection Time: 07/13/18  9:40 AM  Result Value Ref Range   Prothrombin Time 13.9 11.4 - 15.2 seconds   INR 1.08     Comment: Performed at [facility] [address], [place], [place] [address]  APTT     Status: None   Collection Time: 07/13/18  9:40 AM  Result Value Ref Range   aPTT 32 24 - 36 seconds    Comment: Performed at [facility] [address] [place] [address]  CBC     Status: Abnormal   Collection Time: 07/13/18  9:40 AM  Result Value Ref Range   WBC 19.1 (H) 4.0 - 10.5 K/uL   RBC 5.04 3.87 - 5.11 MIL/uL   Hemoglobin 15.2 (H) 12.0 - 15.0 g/dL   HCT 44.8 36.0 - 46.0 %   MCV 88.9 78.0 - 100.0 fL   MCH 30.2 26.0 - 34.0 pg   MCHC 33.9 30.0 - 36.0 g/dL   RDW 12.9 11.5 - 15.5 %   Platelets 220 150 - 400 K/uL    Comment: Performed at [facility], [place] [address],  [place], [place] [address]  Differential     Status: Abnormal   Collection Time: 07/13/18  9:40 AM  Result Value Ref Range   Neutrophils Relative % 87 %   Neutro Abs 16.5 (H) 1.7 - 7.7 K/uL   Lymphocytes Relative 5 %   Lymphs Abs 1.0 0.7 - 4.0 K/uL   Monocytes Relative 7 %   Monocytes Absolute 1.4 (H) 0.1 - 1.0 K/uL   Eosinophils Relative 0 %   Eosinophils Absolute 0.0 0.0 - 0.7 K/uL   Basophils Relative 0 %   Basophils Absolute 0.0 0.0 - 0.1 K/uL   Immature Granulocytes 1 %   Abs Immature Granulocytes 0.1 0.0 - 0.1 K/uL    Comment: Performed at [facility] [address], [place], [place] [address]  Comprehensive metabolic panel     Status: Abnormal   Collection Time: 07/13/18  9:40 AM  Result Value Ref Range   Sodium 140 135 - 145 mmol/L   Potassium 3.4 (L) 3.5 - 5.1 mmol/L   Chloride 101 98 - 111 mmol/L    CO2 27 22 - 32 mmol/L   Glucose, Bld 131 (H) 70 - 99 mg/dL   BUN 17 8 - 23 mg/dL   Creatinine, Ser 1.05 (H) 0.44 - 1.00 mg/dL   Calcium 10.0 8.9 - 10.3 mg/dL   Total Protein 6.6 6.5 - 8.1 g/dL   Albumin 4.1 3.5 - 5.0 g/dL   AST 18 15 - 41 U/L   ALT 19 0 - 44 U/L   Alkaline Phosphatase 82 38 - 126 U/L   Total Bilirubin 1.0 0.3 - 1.2 mg/dL   GFR calc non Af Amer 53 (L) >60 mL/min   GFR calc Af Amer >60 >60 mL/min    Comment: (NOTE) The eGFR has been calculated using the CKD EPI equation. This calculation has not been validated in all clinical situations. eGFR's persistently <60 mL/min signify possible Chronic Kidney Disease.    Anion gap 12 5 - 15    Comment: Performed at [facility] [address], [place],  [address]  I-stat troponin, ED     Status: None   Collection Time: 07/13/18  9:58 AM  Result Value Ref Range   Troponin i, poc 0.02 0.00 - 0.08 ng/mL   Comment 3            Comment: Due to the release kinetics of cTnI, a negative result within the first hours of the onset of symptoms does not rule out myocardial infarction with certainty. If myocardial infarction is still suspected, repeat the test at appropriate intervals.   I-Stat Chem 8, ED     Status: Abnormal   Collection Time: 07/13/18  9:59 AM  Result Value Ref Range   Sodium 140 135 - 145 mmol/L   Potassium 3.3 (L) 3.5 - 5.1 mmol/L   Chloride 100 98 - 111 mmol/L   BUN 19 8 - 23 mg/dL   Creatinine, Ser 0.90 0.44 - 1.00 mg/dL   Glucose, Bld 131 (H) 70 - 99 mg/dL   Calcium, Ion 1.17 1.15 - 1.40 mmol/L   TCO2 30 22 - 32 mmol/L   Hemoglobin 15.0 12.0 - 15.0 g/dL   HCT 44.0 36.0 - 46.0 %    Ct Head Wo Contrast  Result Date: 07/13/2018 CLINICAL DATA:  Focal neuro deficit. Right parietal headache for 2-3 days. EXAM: CT HEAD WITHOUT CONTRAST TECHNIQUE: Contiguous axial images were obtained from the base of the skull through the vertex  without intravenous contrast. COMPARISON:  None. FINDINGS: Brain:  4.7 x 4.2 x 5.3 cm high-density hematoma centered in the right parietal lobe with a thin rim of edema. Shape is irregular, best estimate at volume is 50 cc. No gross underlying lesion. No low-density separate from the hematoma to suggest primary infarct. The sagittal sinuses have normal density. No trauma history. No indication of chronic small vessel disease. There is small volume regional subarachnoid hemorrhage. Small volume of subdural blood is seen along the right frontal convexity measuring up to 4 mm thickness. There is local mass effect without hydrocephalus or entrapment. Vascular: No hyperdense vessel or unexpected calcification. Skull: Negative for fracture Sinuses/Orbits: Negative Other: Critical Value/emergent results were called by telephone at the time of interpretation on 07/13/2018 at 9:43 am to Dr. Marda Stalker , who verbally acknowledged these results. IMPRESSION: 50 cc acute hematoma centered in the right parietal lobe. There is small volume local subarachnoid and subdural extension. Local mass effect without hydrocephalus or midline shift. No detectable underlying cause. Electronically Signed   By: Monte Fantasia M.D.   On: 07/13/2018 09:46    Pertinent items noted in HPI and remainder of comprehensive ROS otherwise negative. Blood pressure (!) 136/91, pulse 78, temperature 97.9 F (36.6 C), temperature source Oral, resp. rate 18, height '5\' 6"'  (1.676 m), weight 89.8 kg, SpO2 98 %. Patient is mildly somnolent but awakens easily.  Her speech is fluent.  Her judgment and insight are appropriate.  Her speech is fluent.  Cranial nerve function demonstrates a mild left central facial weakness otherwise cranial nerve function intact.  Question some degree of visual field deficit although this is unclear.  Motor examination reveals 3/5 weakness in her left upper extremity and 4-/5 weakness in her left lower extremity.  Right upper and lower extremity strength normal.  Examination head ears  eyes and throat is unremarkable.  No evidence of trauma.  Neck supple.  Airway midline.  Chest and abdomen benign.  Assessment/Plan: Right parietal hypertensive hemorrhage.  Plan admission to neurology with close blood pressure monitoring.  Patient should have follow-up head CT scan in morning.  I will follow along as needed.  Mallie Mussel A Malcomb Gangemi 07/13/2018, 10:52 AM

## 2018-07-13 NOTE — H&P (Addendum)
Neurology history and physical  Reason for Consult: Intracranial hemorrhage Referring Physician: Tegeler  CC: Left-sided hemianopsia, left-sided weakness  History is obtained from: Family members  HPI: [name] is a 72 y.o. female past medical history of dyslipidemia, GERD, hypertension, obesity, PAF.  Patient was noted to have a headache this past Saturday and had bouts of vomiting.  On 07/12/2018 she appeared normal until approximately 1300 hrs. when she was brought back from her colonoscopy.  In PACU it was noted that she was not moving her left side as well in addition there is a.  Of time during PACU where her blood pressure did go up to 173/120 per family.  Patient was noted not to be able to get out of bed this morning and have severe left-sided weakness thus she was brought to the emergency department.  CT of the brain was obtained showing a 4.7 x 4.2 x 5.3 cm high density hematoma centered in the right parietal lobe with a thin rim of edema.  While in the ED she was immediately placed on Cardene due to her blood pressure being in the 160s 170s to bring her blood pressure down and given Zofran for nausea vomiting.  Of note she did not take her aspirin today or yesterday  LKW: 1300 hrs. on 07/12/2018 tpa given?: no, intracranial hemorrhage Premorbid modified Rankin scale (mRS): 0 NIH score-5 ICH Score: 2--26% mortality   ROS: A 14 point ROS was performed and is negative except as noted in the HPI.   Past Medical History:  Diagnosis Date  . Dyslipidemia   . GERD (gastroesophageal reflux disease)   . High risk medication use    on Flecainide since January of 2013  . Hypertension   . Obesity   . PAF (paroxysmal atrial fibrillation) (HCC)    normal stress echo in 2011    Family History  Problem Relation Age of Onset  . Alzheimer's disease Mother   . Heart disease Father   . Stroke Sister   . Lung cancer Brother   . Cancer Brother      Social History:   reports  that she has been smoking. She has never used smokeless tobacco. She reports that she does not drink alcohol or use drugs.  Medications  Current Facility-Administered Medications:  .   stroke: mapping our early stages of recovery book, , Does not apply, Once, [name], Katherine Addison Guerra, [name] .  acetaminophen (TYLENOL) tablet 650 mg, 650 mg, Oral, Q4H PRN **OR** acetaminophen (TYLENOL) solution 650 mg, 650 mg, Per Tube, Q4H PRN **OR** acetaminophen (TYLENOL) suppository 650 mg, 650 mg, Rectal, Q4H PRN, [name], Katherine Addison Guerra, [name] .  nicardipine (CARDENE) 20mg  in 0.86% saline 231ml IV infusion (0.1 mg/ml), 0-15 mg/hr, Intravenous, Continuous, [name], Katherine Allegra, [name], Last Rate: 100 mL/hr at 07/13/18 0958, 10 mg/hr at 07/13/18 0958 .  ondansetron (ZOFRAN) injection 4 mg, 4 mg, Intravenous, Q8H PRN, [name], Katherine Addison Guerra, [name] .  senna-docusate (Senokot-S) tablet 1 tablet, 1 tablet, Oral, BID, [name], Katherine Schwab, [name]  Current Outpatient Medications:  .  acetaminophen (TYLENOL) 500 MG tablet, Take 500 mg by mouth every 6 (six) hours as needed for mild pain., Disp: , Rfl:  .  aspirin 81 MG tablet, Take 81 mg by mouth every evening. , Disp: , Rfl:  .  cimetidine (TAGAMET) 400 MG tablet, Take 200 mg by mouth at bedtime. , Disp: , Rfl:  .  Cyanocobalamin (VITAMIN B 12 PO), Take 1,000 mcg by mouth daily., Disp: ,  Rfl:  .  flecainide (TAMBOCOR) 50 MG tablet, TAKE 2 TABLETS EVERY MORNING AND 1 TABLET IN THE EVENING (Patient taking differently: Take 50-100 mg by mouth See admin instructions. Take two tablets  (100mg  ) in the AM and one tablet (50mg ) in the evening), Disp: 90 tablet, Rfl: 5 .  metoprolol tartrate (LOPRESSOR) 25 MG tablet, TAKE 1 TABLET BY MOUTH TWICE A DAY, Disp: 60 tablet, Rfl: 5 .  nitroGLYCERIN (NITROSTAT) 0.4 MG SL tablet, Place 1 tablet (0.4 mg total) under the tongue every 5 (five) minutes as needed for chest pain (x 3 doses)., Disp: 25 tablet, Rfl: 3 .  Omega-3 Fatty Acids (FISH OIL) 1000 MG CAPS, Take 1,000  mg by mouth daily., Disp: , Rfl:  .  sertraline (ZOLOFT) 100 MG tablet, Take 100 mg by mouth daily. , Disp: , Rfl:  .  ciprofloxacin (CIPRO) 500 MG tablet, Take 1 tablet (500 mg total) by mouth 2 (two) times daily. (Patient not taking: Reported on 07/13/2018), Disp: 14 tablet, Rfl: 0 .  HYDROcodone-acetaminophen (NORCO/VICODIN) 5-325 MG per tablet, Take 1-2 tablets by mouth every 4 (four) hours as needed for moderate pain or severe pain. (Patient not taking: Reported on 10/21/2017), Disp: 20 tablet, Rfl: 0 .  ondansetron (ZOFRAN ODT) 4 MG disintegrating tablet, Take 1 tablet (4 mg total) by mouth every 8 (eight) hours as needed for nausea or vomiting. (Patient not taking: Reported on 07/13/2018), Disp: 20 tablet, Rfl: 0   Exam: Current vital signs: BP (!) 136/91   Pulse 78   Temp 97.9 F (36.6 C) (Oral)   Resp 18   Ht 5\' 6"  (1.676 m)   Wt 89.8 kg   SpO2 98%   BMI 31.96 kg/m  Vital signs in last 24 hours: Temp:  [97.9 F (36.6 C)] 97.9 F (36.6 C) (08/27 0934) Pulse Rate:  [59-78] 78 (08/27 1030) Resp:  [13-20] 18 (08/27 1030) BP: (136-163)/(51-91) 136/91 (08/27 1030) SpO2:  [93 %-98 %] 98 % (08/27 1030) Weight:  [89.8 kg] 89.8 kg (08/27 0906)  GENERAL: Awake, alert in NAD HEENT: - Normocephalic and atraumatic, dry mm, no LN++, no Thyromegally LUNGS - Clear to auscultation bilaterally with no wheezes CV - S1S2 RRR, no m/Guerra/g, equal pulses bilaterally. ABDOMEN - Soft, nontender, nondistended with normoactive BS Ext: warm, well perfused, intact peripheral pulses, __ edema  NEURO:  Mental Status: AA&Ox3, speech is clear.  Naming, repetition, fluency, and comprehension intact. Cranial Nerves: PERRL 6 mm with reaction down to 4 mm/brisk. EOMI, dense left hemi-anopsia, no facial asymmetry, facial sensation intact, hearing intact, tongue/uvula/soft palate midline,  Motor: Right arm and leg 5/5, left arm is held at a 45 degree angle with increased tone however I am able to extend her arm if  needed.  She is also weak with shoulder abduction.  Left leg is able to lift off the bed approximately 3 inches with no significant drift and has 3/5 strength.  Right dorsiflexion plantarflexion 5 out of 5, left dorsiflexion plantarflexion 3/5. Tone: is normal on the right with increased tone on the left upper extremity Sensation-decreased sensation on the left arm and leg Coordination: Normal finger-nose and heel-to-shin with the right arm and right leg Gait- deferred  Labs I have reviewed labs in epic and the results pertinent to this consultation are:   CBC    Component Value Date/Time   WBC 19.1 (H) 07/13/2018 0940   RBC 5.04 07/13/2018 0940   HGB 15.0 07/13/2018 0959   HCT 44.0 07/13/2018  0959   PLT 220 07/13/2018 0940   MCV 88.9 07/13/2018 0940   MCH 30.2 07/13/2018 0940   MCHC 33.9 07/13/2018 0940   RDW 12.9 07/13/2018 0940   LYMPHSABS 1.0 07/13/2018 0940   MONOABS 1.4 (H) 07/13/2018 0940   EOSABS 0.0 07/13/2018 0940   BASOSABS 0.0 07/13/2018 0940    CMP     Component Value Date/Time   NA 140 07/13/2018 0959   K 3.3 (L) 07/13/2018 0959   CL 100 07/13/2018 0959   CO2 27 07/13/2018 0940   GLUCOSE 131 (H) 07/13/2018 0959   BUN 19 07/13/2018 0959   CREATININE 0.90 07/13/2018 0959   CALCIUM 10.0 07/13/2018 0940   PROT 6.6 07/13/2018 0940   ALBUMIN 4.1 07/13/2018 0940   AST 18 07/13/2018 0940   ALT 19 07/13/2018 0940   ALKPHOS 82 07/13/2018 0940   BILITOT 1.0 07/13/2018 0940   GFRNONAA 53 (L) 07/13/2018 0940   GFRAA >60 07/13/2018 0940    Lipid Panel  No results found for: CHOL, TRIG, HDL, CHOLHDL, VLDL, LDLCALC, LDLDIRECT   Imaging I have reviewed the images obtained:  CT-scan of the brain--IMPRESSION: 50 cc acute hematoma centered in the right parietal lobe. There is small volume local subarachnoid and subdural extension. Local mass effect without hydrocephalus or midline shift. No detectable underlying cause.  MRI examination of the  brain--pending   NEUROHOSPITALIST ADDENDUM  Performed a face to face diagnostic evaluation. Ct head shows right posterior parietal hemorrhage Patient has left hemiparesis and left hemianopsia on exam.   I have reviewed the contents of history and physical exam as documented by PA/ARNP/Resident and agree with above documentation.  I have discussed and formulated the above plan as documented below. Edits to the note have been made as needed.  7 72 y.o. female past medical history of dyslipidemia, GERD, hypertension, obesity, PAF who presented to the ER after not being able to move her left side since post colonoscopy procedure. Patient was drowsy following procedure and was taken home. Daughter felt she was not moving her left side and thought it was because she still sedated. Today morning, family noted patient not getting up from bed and called EMS. CT revealed 50 cc acute hematoma centered in the right parietal lobe. No significant midline shift.    Acute Right Parietal lobe hemorrhage Cytotoxic and vasogenic cerebral edema    Plan: - Patient will be admitted to ICU - Repeat CT scan in approximately 4 to 6 hours - Continue Cardene to maintain a blood pressure of systolic less than 097 and diastolic less than 90 - Continue Zofran - MRI of brain once stable - Modified NIH stroke scale every 2 hours for total of 12 hours - Stroke swallow screen along with SLP, OT, PT--n.p.o. until passes swallow screen - CDs for now with no aspirin or anticoagulation. -Fall precautions  2) Hypertension -  Will start patient on cardene for blood pressure control - Goal BP is less than 140/90 SBP   3) Paroxsymal atrial fibrillation  Currently not on any anticoagulation but does take aspirin daily. Post stroke will need to talk to cardiology to evaluate for possible anticoagulation.  4) Hyperlipidemia-  currently on fish oil     This patient is neurologically critically ill due to  He is at risk  for significant risk of neurological worsening from cerebral edema,  death from brain herniation, heart failure, hemorrhagic conversion, infection, respiratory failure and seizure. This patient's care requires constant monitoring of vital signs,  hemodynamics, respiratory and cardiac monitoring, review of multiple databases, neurological assessment, discussion with family, other specialists and medical decision making of high complexity.  I spent  minutes of neurocritical time in the care of this patient.       Katherine Addison Nahlia Hellmann [name] [facility] [phone]   If 7pm to 7am, please call on call as listed on AMION.

## 2018-07-13 NOTE — ED Notes (Signed)
Attempted report 

## 2018-07-13 NOTE — Evaluation (Signed)
Clinical/Bedside Swallow Evaluation Patient Details  Name: [name] MRN: [id number] Date of Birth: Jan 23, 1946  Today's Date: 07/13/2018 Time: SLP Start Time (ACUTE ONLY): 1520 SLP Stop Time (ACUTE ONLY): 1545 SLP Time Calculation (min) (ACUTE ONLY): 25 min  Past Medical History:  Past Medical History:  Diagnosis Date  . Dyslipidemia   . GERD (gastroesophageal reflux disease)   . High risk medication use    on Flecainide since January of 2013  . Hypertension   . Obesity   . PAF (paroxysmal atrial fibrillation) (HCC)    normal stress echo in 2011   Past Surgical History:  Past Surgical History:  Procedure Laterality Date  . +    . ABDOMINAL HYSTERECTOMY    . ANKLE SURGERY    . CARDIOVASCULAR STRESS TEST  07/21/2007   EF 80%, NORMAL NONDIAGONISTIC FOR ISCHEMIA  . STRESS ECHO TEST  07/25/2010   EF 60%  . WRIST SURGERY     RIGHT WRIST   HPI:  [name] is a 72 y.o. female past medical history of dyslipidemia, GERD, hypertension, obesity, PAF.  Patient was noted to have a headache this past Saturday and had bouts of vomiting.  On 07/12/2018 she appeared normal until approximately 1300 hrs. when she was brought back to go get her colonoscopy.  In PACU it was noted that she was not moving her left side.  Patient was noted not to be able to get out of bed this morning and have severe left-sided weakness thus she was brought to the emergency department.  CT of the brain was obtained showing a 4.7 x 4.2 x 5.3 cm high density hematoma centered in the right parietal lobe with a thin rim of edema.     Assessment / Plan / Recommendation Clinical Impression   Pt presents with belching following all consistencies, including thin liquids, purees, solids, and nectar thick liquids.  Daughter and pt both report belching to be baseline but possibly slightly increased in frequency. Suspect some baseline esophageal dysfunction given that pt has history of GERD; however, no other  esophageal work up mentioned in chart or by pt.   Pt had one immediate cough following thin liquids via straw and one immediate cough following dry solids.  Her voice is slightly hoarse but cough is strong.   Cup sips of thins were tolerated without overt s/s of aspiration as were purees.  Nectar thick liquids were trialed but did not alleviate belching.   Given significant nausea with vomiting and concomitant irritation of the throat, would suggest initiating POs slowly with a full liquids diet.  ST will continue to follow up for cognitive-linguistic evaluation and to monitor diet toleration and readiness to advance.     SLP Visit Diagnosis: Dysphagia, pharyngoesophageal phase (R13.14)    Aspiration Risk  Mild aspiration risk;Moderate aspiration risk    Diet Recommendation Thin liquid;Other (Comment)(full liquids diet )   Liquid Administration via: Cup Medication Administration: Crushed with puree Supervision: Patient able to self feed Compensations: Minimize environmental distractions;Small sips/bites;Slow rate Postural Changes: Seated upright at 90 degrees;Remain upright for at least 30 minutes after po intake    Other  Recommendations Recommended Consults: Consider esophageal assessment Oral Care Recommendations: Oral care BID   Follow up Recommendations Inpatient Rehab      Frequency and Duration min 2x/week          Prognosis Prognosis for Safe Diet Advancement: Good      Swallow Study   General HPI: [name]  is a 72 y.o. female past medical history of dyslipidemia, GERD, hypertension, obesity, PAF.  Patient was noted to have a headache this past Saturday and had bouts of vomiting.  On 07/12/2018 she appeared normal until approximately 1300 hrs. when she was brought back to go get her colonoscopy.  In PACU it was noted that she was not moving her left side.  Patient was noted not to be able to get out of bed this morning and have severe left-sided weakness thus she was  brought to the emergency department.  CT of the brain was obtained showing a 4.7 x 4.2 x 5.3 cm high density hematoma centered in the right parietal lobe with a thin rim of edema.   Type of Study: Bedside Swallow Evaluation Previous Swallow Assessment: none on record Diet Prior to this Study: NPO Temperature Spikes Noted: No Respiratory Status: Room air History of Recent Intubation: Yes(for procedure) Behavior/Cognition: Cooperative;Pleasant mood;Lethargic/Drowsy Oral Cavity Assessment: Dry Oral Care Completed by SLP: Yes Oral Cavity - Dentition: Adequate natural dentition Vision: Functional for self-feeding Self-Feeding Abilities: Able to feed self Patient Positioning: Upright in bed Baseline Vocal Quality: Hoarse Volitional Cough: Strong Volitional Swallow: Able to elicit    Oral/Motor/Sensory Function Overall Oral Motor/Sensory Function: Within functional limits   Ice Chips     Thin Liquid Thin Liquid: Impaired Presentation: Cup;Straw Pharyngeal  Phase Impairments: Cough - Immediate(with straws x1)    Nectar Thick     Honey Thick     Puree Puree: Within functional limits   Solid     Solid: Impaired Presentation: Self Fed Pharyngeal Phase Impairments: Cough - Immediate      Jaqualin Serpa, Selinda Orion 07/13/2018,3:56 PM

## 2018-07-14 ENCOUNTER — Other Ambulatory Visit (HOSPITAL_COMMUNITY): Payer: Medicare Other

## 2018-07-14 ENCOUNTER — Inpatient Hospital Stay (HOSPITAL_COMMUNITY): Payer: Medicare Other

## 2018-07-14 DIAGNOSIS — D72829 Elevated white blood cell count, unspecified: Secondary | ICD-10-CM

## 2018-07-14 DIAGNOSIS — F17200 Nicotine dependence, unspecified, uncomplicated: Secondary | ICD-10-CM

## 2018-07-14 DIAGNOSIS — I161 Hypertensive emergency: Secondary | ICD-10-CM

## 2018-07-14 DIAGNOSIS — H53462 Homonymous bilateral field defects, left side: Secondary | ICD-10-CM

## 2018-07-14 LAB — BASIC METABOLIC PANEL
ANION GAP: 9 (ref 5–15)
BUN: 19 mg/dL (ref 8–23)
CO2: 27 mmol/L (ref 22–32)
Calcium: 9.5 mg/dL (ref 8.9–10.3)
Chloride: 104 mmol/L (ref 98–111)
Creatinine, Ser: 0.93 mg/dL (ref 0.44–1.00)
GFR calc Af Amer: >60 mL/min (ref >60)
GLUCOSE: 132 mg/dL — AB (ref 70–99)
POTASSIUM: 3.1 mmol/L — AB (ref 3.5–5.1)
Sodium: 140 mmol/L (ref 135–145)

## 2018-07-14 LAB — CBC
HCT: 40.3 % (ref 36.0–46.0)
Hemoglobin: 13.5 g/dL (ref 12.0–15.0)
MCH: 29.7 pg (ref 26.0–34.0)
MCHC: 33.5 g/dL (ref 30.0–36.0)
MCV: 88.6 fL (ref 78.0–100.0)
PLATELETS: 198 10*3/uL (ref 150–400)
RBC: 4.55 MIL/uL (ref 3.87–5.11)
RDW: 12.8 % (ref 11.5–15.5)
WBC: 17 10*3/uL — AB (ref 4.0–10.5)

## 2018-07-14 LAB — LIPID PANEL
Cholesterol: 97 mg/dL (ref 0–200)
HDL: 26 mg/dL — AB (ref >40)
LDL Cholesterol: 49 mg/dL (ref 0–99)
TRIGLYCERIDES: 110 mg/dL (ref <150)
Total CHOL/HDL Ratio: 3.7 RATIO
VLDL: 22 mg/dL (ref 0–40)

## 2018-07-14 LAB — HIV ANTIBODY (ROUTINE TESTING W REFLEX): HIV Screen 4th Generation wRfx: NONREACTIVE

## 2018-07-14 LAB — URINALYSIS, ROUTINE W REFLEX MICROSCOPIC
BILIRUBIN URINE: NEGATIVE
Glucose, UA: NEGATIVE mg/dL
Ketones, ur: NEGATIVE mg/dL
Nitrite: POSITIVE — AB
Protein, ur: NEGATIVE mg/dL
SPECIFIC GRAVITY, URINE: 1.018 (ref 1.005–1.030)
pH: 5 (ref 5.0–8.0)

## 2018-07-14 LAB — HEMOGLOBIN A1C
Hgb A1c MFr Bld: 5.6 % (ref 4.8–5.6)
Mean Plasma Glucose: 114.02 mg/dL

## 2018-07-14 LAB — TSH: TSH: 0.688 u[IU]/mL (ref 0.350–4.500)

## 2018-07-14 MED ORDER — POTASSIUM CHLORIDE CRYS ER 20 MEQ PO TBCR
40.0000 meq | EXTENDED_RELEASE_TABLET | ORAL | Status: AC
  Administered 2018-07-14: 40 meq via ORAL
  Filled 2018-07-14: qty 2

## 2018-07-14 MED ORDER — HEPARIN SODIUM (PORCINE) 5000 UNIT/ML IJ SOLN: 5000.0000 [IU] | Freq: Three times a day (TID) | INTRAMUSCULAR | Status: DC

## 2018-07-14 MED ORDER — GADOBENATE DIMEGLUMINE 529 MG/ML IV SOLN
20.0000 mL | Freq: Once | INTRAVENOUS | Status: AC | PRN
  Administered 2018-07-14: 18 mL via INTRAVENOUS

## 2018-07-14 NOTE — Progress Notes (Addendum)
STROKE TEAM PROGRESS NOTE   SUBJECTIVE (INTERVAL HISTORY) Her daughter and son are at the bedside.  Overall she feels her condition is stable. Still has dense left hemianopia, and left hemiplegia. Seems left motor function worsened than yesterday but MRI showed hematoma stable.    OBJECTIVE Temp:  [97.9 F (36.6 C)-98.2 F (36.8 C)] 97.9 F (36.6 C) (08/28 0830) Pulse Rate:  [67-92] 69 (08/28 0800) Cardiac Rhythm: Normal sinus rhythm (08/28 0800) Resp:  [10-37] 17 (08/28 0700) BP: (81-164)/(24-96) 109/53 (08/28 0800) SpO2:  [90 %-98 %] 91 % (08/28 0800)  No results for input(s): GLUCAP in the last 168 hours. Recent Labs  Lab 07/13/18 0940 07/13/18 0959 07/14/18 0350  NA 140 140 140  K 3.4* 3.3* 3.1*  CL 101 100 104  CO2 27  --  27  GLUCOSE 131* 131* 132*  BUN 17 19 19   CREATININE 1.05* 0.90 0.93  CALCIUM 10.0  --  9.5   Recent Labs  Lab 07/13/18 0940  AST 18  ALT 19  ALKPHOS 82  BILITOT 1.0  PROT 6.6  ALBUMIN 4.1   Recent Labs  Lab 07/13/18 0940 07/13/18 0959 07/13/18 1141 07/14/18 0350  WBC 19.1*  --  22.5* 17.0*  NEUTROABS 16.5*  --  19.9*  --   HGB 15.2* 15.0 16.2* 13.5  HCT 44.8 44.0 48.2* 40.3  MCV 88.9  --  88.3 88.6  PLT 220  --  249 198   No results for input(s): CKTOTAL, CKMB, CKMBINDEX, TROPONINI in the last 168 hours. Recent Labs    07/13/18 0940  LABPROT 13.9  INR 1.08   Recent Labs    07/14/18 0201  COLORURINE YELLOW  LABSPEC 1.018  PHURINE 5.0  GLUCOSEU NEGATIVE  HGBUR SMALL*  BILIRUBINUR NEGATIVE  KETONESUR NEGATIVE  PROTEINUR NEGATIVE  NITRITE POSITIVE*  LEUKOCYTESUR SMALL*       Component Value Date/Time   CHOL 97 07/14/2018 0353   TRIG 110 07/14/2018 0353   HDL 26 (L) 07/14/2018 0353   CHOLHDL 3.7 07/14/2018 0353   VLDL 22 07/14/2018 0353   LDLCALC 49 07/14/2018 0353   Lab Results  Component Value Date   HGBA1C 5.6 07/14/2018   No results found for: LABOPIA, COCAINSCRNUR, LABBENZ, AMPHETMU, THCU, LABBARB  No  results for input(s): ETH in the last 168 hours.  I have personally reviewed the radiological images below and agree with the radiology interpretations.  Ct Head Wo Contrast  Result Date: 07/13/2018 CLINICAL DATA:  Worsening headaches. History of intracranial hemorrhage, hypertension, atrial fibrillation. EXAM: CT HEAD WITHOUT CONTRAST TECHNIQUE: Contiguous axial images were obtained from the base of the skull through the vertex without intravenous contrast. COMPARISON:  CT HEAD July 13, 2018 FINDINGS: BRAIN: 4.4 x 4.7 x 5.3 cm (volume = 57 cc) RIGHT parietal hematoma with similar surrounding low-density vasogenic edema. Extra-axial extension, small volume RIGHT subarachnoid hemorrhage and 5 mm RIGHT subdural hematoma. Mass effect on RIGHT cerebrum without midline shift. No parenchymal brain volume loss for age. No acute large vascular territory infarcts. Basal cisterns are patent. VASCULAR: Minimal calcific atherosclerosis of the carotid siphons. SKULL: No skull fracture. No significant scalp soft tissue swelling. SINUSES/ORBITS: Trace paranasal sinus mucosal thickening. Mastoid air cells are well aerated.The included ocular globes and orbital contents are non-suspicious. OTHER: None. IMPRESSION: 1. 57 cc acute RIGHT parietal hematoma was 50 cc. Similar regional mass effect without midline shift. 2. Similar small volume RIGHT subarachnoid hemorrhage and 5 mm RIGHT subdural hematoma. Electronically Signed   By:  Elon Alas M.D.   On: 07/13/2018 18:12   Ct Head Wo Contrast  Result Date: 07/13/2018 CLINICAL DATA:  Focal neuro deficit. Right parietal headache for 2-3 days. EXAM: CT HEAD WITHOUT CONTRAST TECHNIQUE: Contiguous axial images were obtained from the base of the skull through the vertex without intravenous contrast. COMPARISON:  None. FINDINGS: Brain: 4.7 x 4.2 x 5.3 cm high-density hematoma centered in the right parietal lobe with a thin rim of edema. Shape is irregular, best estimate at  volume is 50 cc. No gross underlying lesion. No low-density separate from the hematoma to suggest primary infarct. The sagittal sinuses have normal density. No trauma history. No indication of chronic small vessel disease. There is small volume regional subarachnoid hemorrhage. Small volume of subdural blood is seen along the right frontal convexity measuring up to 4 mm thickness. There is local mass effect without hydrocephalus or entrapment. Vascular: No hyperdense vessel or unexpected calcification. Skull: Negative for fracture Sinuses/Orbits: Negative Other: Critical Value/emergent results were called by telephone at the time of interpretation on 07/13/2018 at 9:43 am to Dr. Marda Stalker , who verbally acknowledged these results. IMPRESSION: 50 cc acute hematoma centered in the right parietal lobe. There is small volume local subarachnoid and subdural extension. Local mass effect without hydrocephalus or midline shift. No detectable underlying cause. Electronically Signed   By: Monte Fantasia M.D.   On: 07/13/2018 09:46   Mr Jodene Nam Head Wo Contrast  Result Date: 07/14/2018 CLINICAL DATA:  72 y/o  F; cerebral hemorrhage for follow-up. EXAM: MRI HEAD WITHOUT CONTRAST MRA HEAD WITHOUT CONTRAST TECHNIQUE: Multiplanar, multiecho pulse sequences of the brain and surrounding structures were obtained without intravenous contrast. Angiographic images of the head were obtained using MRA technique without contrast. COMPARISON:  07/13/2018 CT head. FINDINGS: MRI HEAD FINDINGS Brain: Motion degradation of multiple sequences. Right parietal hematoma is stable in size given differences in technique measuring approximately 4.4 x 4.9 x 5.4 cm (AP x ML x CC series 22, image 17 and series 30, image 8). Surrounding edema is stable as is local mass effect on the brain partially effacing the atria and temporal horn of right lateral ventricle and resulting in 3 mm right-to-left midline shift. Additionally the small volume of  extra-axial hemorrhage over the right cerebral convexity is stable given differences in technique. There is heterogeneous T1 hyperintensity of blood products. After administration of intravenous contrast there is no appreciable abnormal enhancement. No additional focus of hemorrhage, stroke, or focal mass effect in the brain. Vascular: Normal flow voids. Skull and upper cervical spine: Normal marrow signal. Sinuses/Orbits: Mild paranasal sinus mucosal thickening. Partial left mastoid opacification. Orbits are unremarkable. Other: None. MRA HEAD FINDINGS Severely motion degraded study. No proximal large vessel occlusion or large aneurysm is identified. Suboptimal assessment for small aneurysm or stenosis due to motion artifact. IMPRESSION: 1. Extensive motion artifact. 2. Stable hematoma in the right parietal lobe given differences in technique. Stable associated edema and mass effect with partial effacement of right lateral ventricle and 3 mm right-to-left midline shift. 3. No abnormal enhancement or additional abnormality of the brain identified. 4. MRA degraded by motion artifact. No proximal large vessel occlusion. 5. Consider repeat MRI/MRA when patient is able to hold still. Electronically Signed   By: Kristine Garbe M.D.   On: 07/14/2018 05:08   Mr Jeri Cos ZH Contrast  Result Date: 07/14/2018 CLINICAL DATA:  72 y/o  F; cerebral hemorrhage for follow-up. EXAM: MRI HEAD WITHOUT CONTRAST MRA HEAD WITHOUT CONTRAST TECHNIQUE:  Multiplanar, multiecho pulse sequences of the brain and surrounding structures were obtained without intravenous contrast. Angiographic images of the head were obtained using MRA technique without contrast. COMPARISON:  07/13/2018 CT head. FINDINGS: MRI HEAD FINDINGS Brain: Motion degradation of multiple sequences. Right parietal hematoma is stable in size given differences in technique measuring approximately 4.4 x 4.9 x 5.4 cm (AP x ML x CC series 22, image 17 and series 30,  image 8). Surrounding edema is stable as is local mass effect on the brain partially effacing the atria and temporal horn of right lateral ventricle and resulting in 3 mm right-to-left midline shift. Additionally the small volume of extra-axial hemorrhage over the right cerebral convexity is stable given differences in technique. There is heterogeneous T1 hyperintensity of blood products. After administration of intravenous contrast there is no appreciable abnormal enhancement. No additional focus of hemorrhage, stroke, or focal mass effect in the brain. Vascular: Normal flow voids. Skull and upper cervical spine: Normal marrow signal. Sinuses/Orbits: Mild paranasal sinus mucosal thickening. Partial left mastoid opacification. Orbits are unremarkable. Other: None. MRA HEAD FINDINGS Severely motion degraded study. No proximal large vessel occlusion or large aneurysm is identified. Suboptimal assessment for small aneurysm or stenosis due to motion artifact. IMPRESSION: 1. Extensive motion artifact. 2. Stable hematoma in the right parietal lobe given differences in technique. Stable associated edema and mass effect with partial effacement of right lateral ventricle and 3 mm right-to-left midline shift. 3. No abnormal enhancement or additional abnormality of the brain identified. 4. MRA degraded by motion artifact. No proximal large vessel occlusion. 5. Consider repeat MRI/MRA when patient is able to hold still. Electronically Signed   By: Kristine Garbe M.D.   On: 07/14/2018 05:08   Dg Chest Port 1 View  Result Date: 07/13/2018 CLINICAL DATA:  Leukocytosis EXAM: PORTABLE CHEST 1 VIEW COMPARISON:  09/07/2014 FINDINGS: Cardiac shadow is within normal limits. The lungs are well aerated bilaterally. No focal infiltrate or sizable effusion is seen. Chronic changes in the left base are again noted. No bony abnormality is noted. IMPRESSION: No active disease. Electronically Signed   By: Inez Catalina M.D.   On:  07/13/2018 21:46    TTE pending   PHYSICAL EXAM  Temp:  [97.9 F (36.6 C)-98.2 F (36.8 C)] 97.9 F (36.6 C) (08/28 0830) Pulse Rate:  [67-92] 69 (08/28 0800) Resp:  [10-37] 17 (08/28 0700) BP: (81-164)/(24-96) 109/53 (08/28 0800) SpO2:  [90 %-98 %] 91 % (08/28 0800)  General - Well nourished, well developed, in mild distress due to HA.  Ophthalmologic - fundi not visualized due to noncooperation.  Cardiovascular - Regular rate and rhythm with no murmur.  Mental Status -  Level of arousal and orientation to time, place, and person were intact. Language including expression, naming, repetition, comprehension was assessed and found intact. Fund of Knowledge was assessed and was intact.  Cranial Nerves II - XII - II - dense left hemianopia. III, IV, VI - Extraocular movements intact. V - Facial sensation intact bilaterally. VII - left nasolabial fold flattening. VIII - Hearing & vestibular intact bilaterally. X - Palate elevates symmetrically, no dysarthria. XI - Chin turning & shoulder shrug intact bilaterally. XII - Tongue protrusion intact.  Motor Strength - The patient's strength was normal in RUE and RLE. However, left UE 0/5 and LLE 0/5 iliopsoas, 2/5 knee flexion, 0/5 distally.  Bulk was normal and fasciculations were absent.   Motor Tone - Muscle tone was assessed at the neck and appendages and  was normal.  Reflexes - The patient's reflexes were symmetrical in all extremities and she had left babinski.  Sensory - Light touch, temperature/pinprick were assessed and were decreased on the left, about 50% of right LE and 75% of right UE.  Coordination - The patient had normal movements in the right hands with no ataxia or dysmetria.  Tremor was absent.  Gait and Station - deferred.   ASSESSMENT/PLAN [name] is a 72 y.o. female with history of PAF, HLD, HTN, obesity admitted for HA, vomiting after colonoscopy. CT found to have right occipitoparietal  large ICH. No tPA given due to [place].    ICH:  right occipitoparietal ICH, likely related to spiking hypertension 2/2 vomiting with colonoscopy.   Resultant left hemianopia and left hemiplegia  CT head right occipitoparietal ICH 50cc  CT head repeat with hematoma to 57cc  MRI  Right occipitoparietal ICH, stable with focal mass effects  MRA  No LVO but motion degraded  Pt would need to repeat MRI brain with and without contrast as well as MRA head once ICH resolves to rule out malignancy or AVM or aneurysm  2D Echo  pending  LDL 49  HgbA1c 5.6  SCDs for VTE prophylaxis  aspirin 81 mg daily prior to admission, now on No antithrombotic.   Ongoing aggressive stroke risk factor management  Therapy recommendations:  Pending   Disposition:  Pending   Cerebral edema  68mm midline shift on MRI  No need of 3% at this time  NSG on board  Close monitoring neuro check  PAF  On flecainide   On metoprolol  Currently NSR  On ASA PTA, now on no antithrombotic due to [place]  Hypertensive emergency Stable now On low dose cardene Resume home metoprolol Wean off cardene as able  BP goal < 160  Tobacco abuse  Current smoker  Smoking cessation counseling provided  Pt is willing to quit  Other Stroke Risk Factors  Advanced age  Obesity, Body mass index is 31.99 kg/m.   Other Active Problems  Colon polyps  Leukocytosis - 19.1->22.5->17.0  Hypokalemia - supplement  Hospital day # 1  This patient is critically ill due to large ICH, hypertensive emergency, PAF and at significant risk of neurological worsening, death form hematoma expansion, ischemic stroke, heart failure, hypertensive encephalopathy. This patient's care requires constant monitoring of vital signs, hemodynamics, respiratory and cardiac monitoring, review of multiple databases, neurological assessment, discussion with family, other specialists and medical decision making of high complexity. I spent 40  minutes of neurocritical care time in the care of this patient. I had long discussion with pt and daughter and son at bedside, updated pt current condition, treatment plan and potential prognosis. They expressed understanding and appreciation.     Rosalin Hawking, MD PhD Stroke Neurology 07/14/2018 12:07 PM    To contact Stroke Continuity provider, please refer to [email or web address] After hours, contact General Neurology

## 2018-07-14 NOTE — Evaluation (Signed)
Occupational Therapy Evaluation Patient Details Name: [name] MRN: [id number] DOB: December 10, 1945 Today's Date: 07/14/2018    History of Present Illness Pt is a 72 y.o. female PMH including dyslipidemia, GERD, hypertension, obesity, PAF.  Presenting with left sided weakness, headache, and nausea/vomiting. MRI/CT revealed high density hematoma centered in the right parietal lobe.   Clinical Impression   PTA Pt independent in ADL and mobility - driving, caring for 56 year old grandson while son is at work. Pt is currently mod to max A +2 for bed mobility, LUE demonstrating trace movement.- no pain associated with PROM - joints already stiff. Pt is overall mod to max A for all ADL due to deficits in balance, decreased activity tolerance, and L visual field deficits. Pt will benefit from skilled OT in the acute setting and afterwards at the CIR level to return to PLOF and maximize safety and independence in ADL/transfers.    Follow Up Recommendations  CIR;Supervision/Assistance - 24 hour    Equipment Recommendations  Other (comment)(defer to next venue of care)    Recommendations for Other Services Rehab consult     Precautions / Restrictions Precautions Precautions: Fall Precaution Comments: L visual deficit Restrictions Weight Bearing Restrictions: No      Mobility Bed Mobility Overal bed mobility: Needs Assistance Bed Mobility: Rolling;Sidelying to Sit Rolling: Mod assist Sidelying to sit: +2 for physical assistance;Max assist       General bed mobility comments: able to assist with rolling, but side to sit heavy lifting for trunk and to get L LE off bed  Transfers                 General transfer comment: deferred per RN (not yet off bedrest and pt lethargic)    Balance Overall balance assessment: Needs assistance Sitting-balance support: Feet unsupported;Single extremity supported Sitting balance-Leahy Scale: Poor Sitting balance - Comments: mod A for  balance at EOB x about 5 minutes for vision testing, BP measurement and assessment, limited by headache Postural control: Left lateral lean;Posterior lean                                 ADL either performed or assessed with clinical judgement   ADL Overall ADL's : Needs assistance/impaired Eating/Feeding: Moderate assistance Eating/Feeding Details (indicate cue type and reason): for BUE tasks Grooming: Moderate assistance   Upper Body Bathing: Maximal assistance   Lower Body Bathing: Total assistance   Upper Body Dressing : Maximal assistance   Lower Body Dressing: Maximal assistance;Bed level               Functional mobility during ADLs: (Deferred this session) General ADL Comments: educated Pt and son on protecting LUE during positioning due to decreased sensation     Vision Baseline Vision/History: Wears glasses Wears Glasses: At all times Patient Visual Report: Peripheral vision impairment Vision Assessment?: Yes Eye Alignment: Within Functional Limits Ocular Range of Motion: Within Functional Limits Alignment/Gaze Preference: Within Defined Limits Tracking/Visual Pursuits: Decreased smoothness of horizontal tracking Visual Fields: Left visual field deficit Diplopia Assessment: (denies)     Perception     Praxis      Pertinent Vitals/Pain Pain Assessment: Faces Faces Pain Scale: Hurts little more Pain Location: headache Pain Descriptors / Indicators: Headache Pain Intervention(s): Monitored during session;Repositioned;Premedicated before session     Hand Dominance Right   Extremity/Trunk Assessment Upper Extremity Assessment Upper Extremity Assessment: LUE deficits/detail LUE Deficits / Details:  trace movement - stiffness in joints initially LUE Sensation: decreased light touch LUE Coordination: decreased fine motor;decreased gross motor   Lower Extremity Assessment Lower Extremity Assessment: Defer to PT evaluation LLE Deficits /  Details: AAROM/PROM WFL except ankle DF limited by about 20 degrees PROM; noted spasticity 2/4 on modified [place], strength hip flexion at least 2/5, knee extension not activated in supine, noted no active ankle movement LLE Sensation: decreased light touch       Communication Communication Communication: No difficulties   Cognition Arousal/Alertness: Awake/alert Behavior During Therapy: WFL for tasks assessed/performed Overall Cognitive Status: Within Functional Limits for tasks assessed                                     General Comments  VSS throughout; son present throughout session     Exercises     Shoulder Instructions      Home Living Family/patient expects to be discharged to:: Private residence Living Arrangements: Alone Available Help at Discharge: Family;Available 24 hours/day(son, daughter in law, daughter, cousin) Type of Home: House Home Access: Stairs to enter Technical brewer of Steps: 5 Entrance Stairs-Rails: Right;Left;Can reach both Home Layout: One level     Bathroom Shower/Tub: Occupational psychologist: Handicapped height     Home Equipment: Bedside commode;Walker - 4 wheels;Grab bars - tub/shower;Hand held shower head;Shower seat - built in          Prior Functioning/Environment Level of Independence: Independent        Comments: driving, takes care of 81 year old grandson        OT Problem List: Decreased strength;Decreased range of motion;Decreased activity tolerance;Impaired balance (sitting and/or standing);Impaired vision/perception;Decreased coordination;Decreased knowledge of use of DME or AE;Impaired sensation;Obesity;Impaired UE functional use;Pain      OT Treatment/Interventions: Self-care/ADL training;Therapeutic exercise;Neuromuscular education;DME and/or AE instruction;Manual therapy;Therapeutic activities;Visual/perceptual remediation/compensation;Patient/family education;Balance training     OT Goals(Current goals can be found in the care plan section) Acute Rehab OT Goals Patient Stated Goal: agreeable to rehab OT Goal Formulation: With patient/family Time For Goal Achievement: 07/28/18 Potential to Achieve Goals: Good ADL Goals Pt Will Perform Grooming: with set-up;sitting Pt Will Perform Upper Body Dressing: with min guard assist;with caregiver independent in assisting;sitting Pt Will Perform Lower Body Dressing: with max assist;bed level Pt Will Transfer to Toilet: with mod assist;with +2 assist;bedside commode;stand pivot transfer Pt Will Perform Toileting - Clothing Manipulation and hygiene: with min assist;sitting/lateral leans Pt/caregiver will Perform Home Exercise Program: Left upper extremity;Increased ROM;Increased strength;Independently;With written HEP provided Additional ADL Goal #1: Pt will perform bed mobility at mod A level prior to engaging in ADL activity Additional ADL Goal #2: Pt will demonstrate at least 1 compensatory strategy for left visual field deficit during ADL  OT Frequency: Min 3X/week   Barriers to D/C:            Co-evaluation PT/OT/SLP Co-Evaluation/Treatment: Yes Reason for Co-Treatment: Complexity of the patient's impairments (multi-system involvement);For patient/therapist safety;To address functional/ADL transfers PT goals addressed during session: Mobility/safety with mobility;Balance;Strengthening/ROM OT goals addressed during session: ADL's and self-care;Strengthening/ROM      AM-PAC PT "6 Clicks" Daily Activity     Outcome Measure Help from another person eating meals?: A Lot Help from another person taking care of personal grooming?: A Lot Help from another person toileting, which includes using toliet, bedpan, or urinal?: A Lot Help from another person bathing (including washing, rinsing, drying)?:  A Lot Help from another person to put on and taking off regular upper body clothing?: A Lot Help from another person to put on  and taking off regular lower body clothing?: A Lot 6 Click Score: 12   End of Session Nurse Communication: Mobility status  Activity Tolerance: Patient tolerated treatment well Patient left: in bed;with call bell/phone within reach;with family/visitor present  OT Visit Diagnosis: Unsteadiness on feet (R26.81);Other abnormalities of gait and mobility (R26.89);Muscle weakness (generalized) (M62.81);Hemiplegia and hemiparesis;Pain Hemiplegia - Right/Left: Left Hemiplegia - dominant/non-dominant: Non-Dominant Hemiplegia - caused by: Nontraumatic intracerebral hemorrhage Pain - part of body: (headache)                Time: 1100-1126 OT Time Calculation (min): 26 min Charges:  OT General Charges $OT Visit: 1 Visit OT Evaluation $OT Eval Moderate Complexity: 1 Mod  Hulda Humphrey OTR/L [place] 07/14/2018, 12:58 PM

## 2018-07-14 NOTE — Evaluation (Signed)
Speech Language Pathology Evaluation Patient Details Name: [name] MRN: [id number] DOB: 1946/05/20 Today's Date: 07/14/2018 Time: 1000-1030 SLP Time Calculation (min) (ACUTE ONLY): 30 min  Problem List:  Patient Active Problem List   Diagnosis Date Noted  . ICH (intracerebral hemorrhage) ([place]) 07/13/2018  . Hypertension 07/06/2012  . Paroxysmal atrial fibrillation ([place]) 11/27/2011  . DIARRHEA, ACUTE 05/31/2009  . GERD 03/06/2009  . BACK PAIN 03/06/2009  . ANXIETY 06/12/2007  . DEPRESSION 06/12/2007  . HEADACHE 06/12/2007   Past Medical History:  Past Medical History:  Diagnosis Date  . Dyslipidemia   . GERD (gastroesophageal reflux disease)   . High risk medication use    on Flecainide since January of 2013  . Hypertension   . Obesity   . PAF (paroxysmal atrial fibrillation) ([facility])    normal stress echo in 2011   Past Surgical History:  Past Surgical History:  Procedure Laterality Date  . +    . ABDOMINAL HYSTERECTOMY    . ANKLE SURGERY    . CARDIOVASCULAR STRESS TEST  07/21/2007   EF 80%, NORMAL NONDIAGONISTIC FOR ISCHEMIA  . STRESS ECHO TEST  07/25/2010   EF 60%  . WRIST SURGERY     RIGHT WRIST   HPI:  [name] is a 72 y.o. female past medical history of dyslipidemia, GERD, hypertension, obesity, PAF.  Patient was noted to have a headache this past Saturday and had bouts of vomiting.  On 07/12/2018 she appeared normal until approximately 1300 hrs. when she was brought back to go get her colonoscopy.  In PACU it was noted that she was not moving her left side.  Patient was noted not to be able to get out of bed this morning and have severe left-sided weakness thus she was brought to the emergency department.  CT of the brain was obtained showing a 4.7 x 4.2 x 5.3 cm high density hematoma centered in the right parietal lobe with a thin rim of edema.     Assessment / Plan / Recommendation Clinical Impression  Pt demonstrates speec, language and  cognitive function WNL. She does suffer from a left visual field cut apparent in assessment. Initiated anchoring and tracking with her finder during functional tasks and reading. Will follow to reinforce strategies.     SLP Assessment  SLP Recommendation/Assessment: Patient needs continued Speech Lanaguage Pathology Services SLP Visit Diagnosis: Attention and concentration deficit Attention and concentration deficit following: Nontraumatic intracerebral hemorrhage    Follow Up Recommendations  Inpatient Rehab    Frequency and Duration min 2x/week  2 weeks      SLP Evaluation Cognition  Overall Cognitive Status: Impaired/Different from baseline Awareness: Impaired Awareness Impairment: Intellectual impairment Problem Solving: Impaired Problem Solving Impairment: Functional complex       Comprehension  Auditory Comprehension Overall Auditory Comprehension: Appears within functional limits for tasks assessed Visual Recognition/Discrimination Discrimination: Exceptions to [facility]    Expression Verbal Expression Overall Verbal Expression: Appears within functional limits for tasks assessed   Oral / Motor  Oral Motor/Sensory Function Overall Oral Motor/Sensory Function: Within functional limits Motor Speech Overall Motor Speech: Appears within functional limits for tasks assessed   GO                   Herbie Baltimore, MA CCC-SLP [phone]  Lynann Beaver 07/14/2018, 12:24 PM

## 2018-07-14 NOTE — Progress Notes (Signed)
  Speech Language Pathology Treatment: Dysphagia  Patient Details Name: [name] MRN: [id number] DOB: 06/14/1946 Today's Date: 07/14/2018 Time: 1000-1030 SLP Time Calculation (min) (ACUTE ONLY): 30 min  Assessment / Plan / Recommendation Clinical Impression  Pt tolerating diet, but continues to belch, feels stomach upset. No further SLP needs, but may need to address meds for GER. Will sign off for swallowing.   HPI HPI: [name] is a 72 y.o. female past medical history of dyslipidemia, GERD, hypertension, obesity, PAF.  Patient was noted to have a headache this past Saturday and had bouts of vomiting.  On 07/12/2018 she appeared normal until approximately 1300 hrs. when she was brought back to go get her colonoscopy.  In PACU it was noted that she was not moving her left side.  Patient was noted not to be able to get out of bed this morning and have severe left-sided weakness thus she was brought to the emergency department.  CT of the brain was obtained showing a 4.7 x 4.2 x 5.3 cm high density hematoma centered in the right parietal lobe with a thin rim of edema.        SLP Plan  All goals met  Patient needs continued Speech Lanaguage Pathology Services    Recommendations  Diet recommendations: Regular;Thin liquid Liquids provided via: Cup;Straw Medication Administration: Crushed with puree Supervision: Patient able to self feed                Follow up Recommendations: Inpatient Rehab SLP Visit Diagnosis: Attention and concentration deficit Attention and concentration deficit following: Nontraumatic intracerebral hemorrhage Plan: All goals met       GO               Herbie Baltimore, MA CCC-SLP [phone]  Lynann Beaver 07/14/2018, 12:28 PM

## 2018-07-14 NOTE — Evaluation (Signed)
Physical Therapy Evaluation Patient Details Name: [name] MRN: [id number] DOB: 02-Jun-1946 Today's Date: 07/14/2018   History of Present Illness  Pt is a 72 y.o. female PMH including dyslipidemia, GERD, hypertension, obesity, PAF.  Presenting with left sided weakness, headache, and nausea/vomiting. MRI/CT revealed high density hematoma centered in the right parietal lobe.  Clinical Impression  Patient presents with decreased independence with mobility due to L hemiparesis, decreased sitting balance, decreased vision, decreased activity tolerance, and generalized weakness.  She will benefit from skilled PT in the acute setting to maximize mobility prior to d/c to CIR level rehab.     Follow Up Recommendations CIR;Supervision/Assistance - 24 hour    Equipment Recommendations  Other (comment)(TBA)    Recommendations for Other Services Rehab consult     Precautions / Restrictions Precautions Precautions: Fall Precaution Comments: L field cut      Mobility  Bed Mobility Overal bed mobility: Needs Assistance Bed Mobility: Rolling;Sidelying to Sit Rolling: Mod assist Sidelying to sit: +2 for physical assistance;Max assist       General bed mobility comments: able to assist with rolling, but side to sit heavy lifting for trunk and to get L LE off bed  Transfers                 General transfer comment: deferred per RN (not yet off bedrest and pt lethargic)  Ambulation/Gait                Stairs            Wheelchair Mobility    Modified Rankin (Stroke Patients Only) Modified Rankin (Stroke Patients Only) Pre-Morbid Rankin Score: No symptoms Modified Rankin: Severe disability     Balance Overall balance assessment: Needs assistance Sitting-balance support: Feet unsupported;Single extremity supported Sitting balance-Leahy Scale: Poor Sitting balance - Comments: mod A for balance at EOB x about 5 minutes for vision testing, BP measurement  and assessment, limited by headache Postural control: Left lateral lean;Posterior lean                                   Pertinent Vitals/Pain Pain Assessment: No/denies pain    Home Living Family/patient expects to be discharged to:: Private residence Living Arrangements: Alone Available Help at Discharge: Family;Available 24 hours/day(states her cousin can stay with her) Type of Home: House Home Access: Stairs to enter Entrance Stairs-Rails: Right;Left;Can reach both Entrance Stairs-Number of Steps: 5 Home Layout: One level Home Equipment: Bedside commode;Walker - 4 wheels;Grab bars - tub/shower;Hand held shower head;Shower seat - built in      Prior Function Level of Independence: Independent         Comments: driving, takes care of 55 year old grandson     Hand Dominance        Extremity/Trunk Assessment   Upper Extremity Assessment Upper Extremity Assessment: Defer to OT evaluation    Lower Extremity Assessment Lower Extremity Assessment: LLE deficits/detail LLE Deficits / Details: AAROM/PROM WFL except ankle DF limited by about 20 degrees PROM; noted spasticity 2/4 on modified [place], strength hip flexion at least 2/5, knee extension not activated in supine, noted no active ankle movement LLE Sensation: decreased light touch       Communication   Communication: No difficulties  Cognition Arousal/Alertness: Awake/alert Behavior During Therapy: WFL for tasks assessed/performed Overall Cognitive Status: Within Functional Limits for tasks assessed  General Comments General comments (skin integrity, edema, etc.): son in room and reports he lives next door, but works    Exercises     Assessment/Plan    PT Assessment Patient needs continued PT services  PT Problem List Decreased strength;Decreased mobility;Decreased balance;Decreased knowledge of use of DME;Impaired sensation;Decreased  activity tolerance;Decreased knowledge of precautions;Decreased safety awareness       PT Treatment Interventions DME instruction;Therapeutic activities;Gait training;Therapeutic exercise;Patient/family education;Balance training;Functional mobility training;Neuromuscular re-education    PT Goals (Current goals can be found in the Care Plan section)  Acute Rehab PT Goals Patient Stated Goal: agreeable to rehab PT Goal Formulation: With patient/family Time For Goal Achievement: 07/28/18 Potential to Achieve Goals: Good    Frequency Min 4X/week   Barriers to discharge        Co-evaluation PT/OT/SLP Co-Evaluation/Treatment: Yes Reason for Co-Treatment: For patient/therapist safety PT goals addressed during session: Mobility/safety with mobility;Balance         AM-PAC PT "6 Clicks" Daily Activity  Outcome Measure Difficulty turning over in bed (including adjusting bedclothes, sheets and blankets)?: Unable Difficulty moving from lying on back to sitting on the side of the bed? : Unable Difficulty sitting down on and standing up from a chair with arms (e.g., wheelchair, bedside commode, etc,.)?: Unable Help needed moving to and from a bed to chair (including a wheelchair)?: Total Help needed walking in hospital room?: Total Help needed climbing 3-5 steps with a railing? : Total 6 Click Score: 6    End of Session Equipment Utilized During Treatment: Gait belt Activity Tolerance: Patient limited by fatigue;Patient limited by pain Patient left: in bed;with call bell/phone within reach;with family/visitor present Nurse Communication: Mobility status PT Visit Diagnosis: Other abnormalities of gait and mobility (R26.89);Hemiplegia and hemiparesis Hemiplegia - caused by: Other Nontraumatic intracranial hemorrhage    Time: 9407-6808 PT Time Calculation (min) (ACUTE ONLY): 26 min   Charges:   PT Evaluation $PT Eval Moderate Complexity: [place],  [place] [phone] 07/14/2018   Reginia Naas 07/14/2018, 11:43 AM

## 2018-07-14 NOTE — Progress Notes (Signed)
Follow-up MRI scan consistent with lobar hypertensive hemorrhage.  No evidence of neoplastic disease or vascular malformation.  Hemorrhage stable in size with no new significant edema or mass-effect.  I will sign off for now.  Call me if her situation changes.

## 2018-07-14 NOTE — Progress Notes (Signed)
OT Cancellation Note  Patient Details Name: [name] MRN: [id number] DOB: 1946/08/19   Cancelled Treatment:    Reason Eval/Treat Not Completed: Active bedrest order  Jaci Carrel 07/14/2018, 9:14 AM  Hulda Humphrey OTR/L [phone]

## 2018-07-14 NOTE — Progress Notes (Signed)
Rehab Admissions Coordinator Note:  Patient was screened by Cleatrice Burke for appropriateness for an Inpatient Acute Rehab Consult per PT recommendation.  At this time, we are recommending await further progress with therapy once patient off bedrest and can fully participate with rehab assessment. I will follow for timing of order.Cleatrice Burke 07/14/2018, 11:56 AM  I can be reached at [phone].

## 2018-07-15 ENCOUNTER — Inpatient Hospital Stay (HOSPITAL_COMMUNITY): Payer: Medicare Other

## 2018-07-15 DIAGNOSIS — I611 Nontraumatic intracerebral hemorrhage in hemisphere, cortical: Secondary | ICD-10-CM

## 2018-07-15 DIAGNOSIS — I619 Nontraumatic intracerebral hemorrhage, unspecified: Principal | ICD-10-CM

## 2018-07-15 DIAGNOSIS — I340 Nonrheumatic mitral (valve) insufficiency: Secondary | ICD-10-CM

## 2018-07-15 LAB — CBC
HEMATOCRIT: 40.1 % (ref 36.0–46.0)
HEMOGLOBIN: 13.3 g/dL (ref 12.0–15.0)
MCH: 30 pg (ref 26.0–34.0)
MCHC: 33.2 g/dL (ref 30.0–36.0)
MCV: 90.3 fL (ref 78.0–100.0)
Platelets: 195 10*3/uL (ref 150–400)
RBC: 4.44 MIL/uL (ref 3.87–5.11)
RDW: 12.6 % (ref 11.5–15.5)
WBC: 15.6 10*3/uL — AB (ref 4.0–10.5)

## 2018-07-15 LAB — BASIC METABOLIC PANEL
ANION GAP: 8 (ref 5–15)
BUN: 16 mg/dL (ref 8–23)
CHLORIDE: 105 mmol/L (ref 98–111)
CO2: 25 mmol/L (ref 22–32)
Calcium: 9.3 mg/dL (ref 8.9–10.3)
Creatinine, Ser: 0.85 mg/dL (ref 0.44–1.00)
GFR calc non Af Amer: >60 mL/min (ref >60)
GLUCOSE: 162 mg/dL — AB (ref 70–99)
POTASSIUM: 3.8 mmol/L (ref 3.5–5.1)
Sodium: 138 mmol/L (ref 135–145)

## 2018-07-15 LAB — ECHOCARDIOGRAM COMPLETE
HEIGHTINCHES: 66 in
WEIGHTICAEL: 3171.45 [oz_av]

## 2018-07-15 LAB — MAGNESIUM: Magnesium: 1.9 mg/dL (ref 1.7–2.4)

## 2018-07-15 NOTE — Consult Note (Signed)
pat     Physical Medicine and Rehabilitation Consult   Reason for Consult: Right parietal hematoma with left sided weakness Referring Physician: Dr. Erlinda Hong   HPI: [name] is a 72 y.o. female with history of hypertension, PAF, obesity, who was admitted on 07/13/2018 with progressive headache, nausea/ vomiting, and left-sided weakness.  CT of head done revealing right parietal lobe hematoma with small local SAH and SDH with local mass-effect.  Dr. Annette Stable felt that  hemorrhage was due to hypertension and recommended monitoring with repeat CT. MRI/MRA brain done 828 and revealed stable hematoma right parietal lobe with stable mass-effect and MRA limited by motion artifact--no large vessel occlusion noted. ASA discontinued and Dr. Erlinda Hong recommends repeating MRI/MRA  brain once ICH resolved to rule out malignancy, aneurysm or AVM.     CT had to be repeated tomorrow.  Patient with resultant dense left hemiplegia and left hemianopsia as well as ongoing headaches.  Serial recommended for follow-up therapy Patient also complaining of numbness in the left arm. Working with physical therapy currently.  Review of Systems  Unable to perform ROS: Medical condition  HENT: Negative for hearing loss.   Respiratory: Negative for shortness of breath.   Cardiovascular: Negative for chest pain.  Gastrointestinal: Positive for heartburn, nausea and vomiting. Negative for abdominal pain.  Neurological: Positive for focal weakness.    Past Medical History:  Diagnosis Date  . Dyslipidemia   . GERD (gastroesophageal reflux disease)   . High risk medication use    on Flecainide since January of 2013  . Hypertension   . Obesity   . PAF (paroxysmal atrial fibrillation) (HCC)    normal stress echo in 2011    Past Surgical History:  Procedure Laterality Date  . +    . ABDOMINAL HYSTERECTOMY    . ANKLE SURGERY    . CARDIOVASCULAR STRESS TEST  07/21/2007   EF 80%, NORMAL NONDIAGONISTIC FOR ISCHEMIA  .  STRESS ECHO TEST  07/25/2010   EF 60%  . WRIST SURGERY     RIGHT WRIST    Family History  Problem Relation Age of Onset  . Alzheimer's disease Mother   . Heart disease Father   . Stroke Sister   . Lung cancer Brother   . Cancer Brother     Social History:  Lives alone. Son lives behind her and she has other local family.  Sedentary but independent without AD prior to admission. Used to work at an [employer]. She reports that she has been smoking--1 PPD. She has never used smokeless tobacco. She reports that she does not drink alcohol or use drugs.    Allergies  Allergen Reactions  . Penicillins     Passed out Has patient had a PCN reaction causing immediate rash, facial/tongue/throat swelling, SOB or lightheadedness with hypotension: Yes Has patient had a PCN reaction causing severe rash involving mucus membranes or skin necrosis: No Has patient had a PCN reaction that required hospitalization: No Has patient had a PCN reaction occurring within the last 10 years: No If all of the above answers are "NO", then may proceed with Cephalosporin use.   . Tape Rash    Medical tape did this    Medications Prior to Admission  Medication Sig Dispense Refill  . acetaminophen (TYLENOL) 500 MG tablet Take 500 mg by mouth every 6 (six) hours as needed for mild pain.    Marland Kitchen aspirin 81 MG tablet Take 81 mg by mouth every evening.     Marland Kitchen  cimetidine (TAGAMET) 400 MG tablet Take 200 mg by mouth at bedtime.     . Cyanocobalamin (VITAMIN B 12 PO) Take 1,000 mcg by mouth daily.    . flecainide (TAMBOCOR) 50 MG tablet TAKE 2 TABLETS EVERY MORNING AND 1 TABLET IN THE EVENING (Patient taking differently: Take 50-100 mg by mouth See admin instructions. Take two tablets  (100mg  ) in the AM and one tablet (50mg ) in the evening) 90 tablet 5  . metoprolol tartrate (LOPRESSOR) 25 MG tablet TAKE 1 TABLET BY MOUTH TWICE A DAY 60 tablet 5  . nitroGLYCERIN (NITROSTAT) 0.4 MG SL tablet Place 1 tablet (0.4 mg  total) under the tongue every 5 (five) minutes as needed for chest pain (x 3 doses). 25 tablet 3  . Omega-3 Fatty Acids (FISH OIL) 1000 MG CAPS Take 1,000 mg by mouth daily.    . sertraline (ZOLOFT) 100 MG tablet Take 100 mg by mouth daily.     . ciprofloxacin (CIPRO) 500 MG tablet Take 1 tablet (500 mg total) by mouth 2 (two) times daily. (Patient not taking: Reported on 07/13/2018) 14 tablet 0  . HYDROcodone-acetaminophen (NORCO/VICODIN) 5-325 MG per tablet Take 1-2 tablets by mouth every 4 (four) hours as needed for moderate pain or severe pain. (Patient not taking: Reported on 10/21/2017) 20 tablet 0  . ondansetron (ZOFRAN ODT) 4 MG disintegrating tablet Take 1 tablet (4 mg total) by mouth every 8 (eight) hours as needed for nausea or vomiting. (Patient not taking: Reported on 07/13/2018) 20 tablet 0    Home: [place] expects to be discharged to:: Private residence Living Arrangements: Alone Available Help at Discharge: Family, Available 24 hours/day(son, daughter in law, daughter, cousin) Type of Home: House Home Access: Stairs to enter Technical brewer of Steps: 5 Entrance Stairs-Rails: Right, Left, Can reach both Home Layout: One level Bathroom Shower/Tub: Multimedia programmer: Handicapped height Home Equipment: Bedside commode, Environmental consultant - 4 wheels, Grab bars - tub/shower, Hand held shower head, Shower seat - built in  Functional History: Prior Function Level of Independence: Independent Comments: driving, takes care of 60 year old grandson Functional Status:  Mobility: Bed Mobility Overal bed mobility: Needs Assistance Bed Mobility: Rolling, Sidelying to Sit Rolling: Mod assist Sidelying to sit: +2 for physical assistance, Max assist General bed mobility comments: able to assist with rolling, but side to sit heavy lifting for trunk and to get L LE off bed Transfers General transfer comment: deferred per RN (not yet off bedrest and pt lethargic)        ADL: ADL Overall ADL's : Needs assistance/impaired Eating/Feeding: Moderate assistance Eating/Feeding Details (indicate cue type and reason): for BUE tasks Grooming: Moderate assistance Upper Body Bathing: Maximal assistance Lower Body Bathing: Total assistance Upper Body Dressing : Maximal assistance Lower Body Dressing: Maximal assistance, Bed level Functional mobility during ADLs: (Deferred this session) General ADL Comments: educated Pt and son on protecting LUE during positioning due to decreased sensation  Cognition: Cognition Overall Cognitive Status: Within Functional Limits for tasks assessed Orientation Level: Oriented X4 Awareness: Impaired Awareness Impairment: Intellectual impairment Problem Solving: Impaired Problem Solving Impairment: Functional complex Cognition Arousal/Alertness: Awake/alert Behavior During Therapy: WFL for tasks assessed/performed Overall Cognitive Status: Within Functional Limits for tasks assessed  Blood pressure 131/66, pulse 73, temperature 98.4 F (36.9 C), temperature source Oral, resp. rate 19, height 5\' 6"  (1.676 m), weight 89.9 kg, SpO2 97 %. Physical Exam  Nursing note and vitals reviewed. Constitutional: She appears well-developed and well-nourished. She has  a sickly appearance.  Obese female with damp washcloth of head.  In moderate distress due to nausea with frequent eructations.   HENT:  Head: Normocephalic and atraumatic.  Eyes: Pupils are equal, round, and reactive to light. Conjunctivae are normal.  Neck: Normal range of motion.  Cardiovascular: Normal rate and regular rhythm.  Respiratory: Effort normal and breath sounds normal.  GI: Soft. Bowel sounds are normal.  Neurological: She is alert. Gait abnormal.  Able to answer orientation questions without difficulty. Left hemiparesis noted.  Motor strength is 0/5 in the left deltoid bicep tricep grip 0/5 in the left hip flexor knee extensor ankle dorsiflexor 4/5 in  the right hip flexor knee extensor ankle dorsiflexor 4/5 in the right deltoid bicep tricep grip Sitting balance is poor she leans heavily toward the left side.  She needs cueing to use her right hand on the bed rail to steady herself. Sensation is absent to light touch in the left upper limb in the left lower limb  Psychiatric: She has a normal mood and affect.    Results for orders placed or performed during the hospital encounter of 07/13/18 (from the past 24 hour(s))  CBC     Status: Abnormal   Collection Time: 07/15/18  3:05 AM  Result Value Ref Range   WBC 15.6 (H) 4.0 - 10.5 K/uL   RBC 4.44 3.87 - 5.11 MIL/uL   Hemoglobin 13.3 12.0 - 15.0 g/dL   HCT 40.1 36.0 - 46.0 %   MCV 90.3 78.0 - 100.0 fL   MCH 30.0 26.0 - 34.0 pg   MCHC 33.2 30.0 - 36.0 g/dL   RDW 12.6 11.5 - 15.5 %   Platelets 195 150 - 400 K/uL  Basic metabolic panel     Status: Abnormal   Collection Time: 07/15/18  3:05 AM  Result Value Ref Range   Sodium 138 135 - 145 mmol/L   Potassium 3.8 3.5 - 5.1 mmol/L   Chloride 105 98 - 111 mmol/L   CO2 25 22 - 32 mmol/L   Glucose, Bld 162 (H) 70 - 99 mg/dL   BUN 16 8 - 23 mg/dL   Creatinine, Ser 0.85 0.44 - 1.00 mg/dL   Calcium 9.3 8.9 - 10.3 mg/dL   GFR calc non Af Amer >60 >60 mL/min   GFR calc Af Amer >60 >60 mL/min   Anion gap 8 5 - 15  Magnesium     Status: None   Collection Time: 07/15/18  3:05 AM  Result Value Ref Range   Magnesium 1.9 1.7 - 2.4 mg/dL   Ct Head Wo Contrast  Result Date: 07/13/2018 CLINICAL DATA:  Worsening headaches. History of intracranial hemorrhage, hypertension, atrial fibrillation. EXAM: CT HEAD WITHOUT CONTRAST TECHNIQUE: Contiguous axial images were obtained from the base of the skull through the vertex without intravenous contrast. COMPARISON:  CT HEAD July 13, 2018 FINDINGS: BRAIN: 4.4 x 4.7 x 5.3 cm (volume = 57 cc) RIGHT parietal hematoma with similar surrounding low-density vasogenic edema. Extra-axial extension, small volume  RIGHT subarachnoid hemorrhage and 5 mm RIGHT subdural hematoma. Mass effect on RIGHT cerebrum without midline shift. No parenchymal brain volume loss for age. No acute large vascular territory infarcts. Basal cisterns are patent. VASCULAR: Minimal calcific atherosclerosis of the carotid siphons. SKULL: No skull fracture. No significant scalp soft tissue swelling. SINUSES/ORBITS: Trace paranasal sinus mucosal thickening. Mastoid air cells are well aerated.The included ocular globes and orbital contents are non-suspicious. OTHER: None. IMPRESSION: 1. 57 cc acute RIGHT parietal  hematoma was 50 cc. Similar regional mass effect without midline shift. 2. Similar small volume RIGHT subarachnoid hemorrhage and 5 mm RIGHT subdural hematoma. Electronically Signed   By: Elon Alas M.D.   On: 07/13/2018 18:12   Mr Jodene Nam Head Wo Contrast  Result Date: 07/14/2018 CLINICAL DATA:  72 y/o  F; cerebral hemorrhage for follow-up. EXAM: MRI HEAD WITHOUT CONTRAST MRA HEAD WITHOUT CONTRAST TECHNIQUE: Multiplanar, multiecho pulse sequences of the brain and surrounding structures were obtained without intravenous contrast. Angiographic images of the head were obtained using MRA technique without contrast. COMPARISON:  07/13/2018 CT head. FINDINGS: MRI HEAD FINDINGS Brain: Motion degradation of multiple sequences. Right parietal hematoma is stable in size given differences in technique measuring approximately 4.4 x 4.9 x 5.4 cm (AP x ML x CC series 22, image 17 and series 30, image 8). Surrounding edema is stable as is local mass effect on the brain partially effacing the atria and temporal horn of right lateral ventricle and resulting in 3 mm right-to-left midline shift. Additionally the small volume of extra-axial hemorrhage over the right cerebral convexity is stable given differences in technique. There is heterogeneous T1 hyperintensity of blood products. After administration of intravenous contrast there is no appreciable  abnormal enhancement. No additional focus of hemorrhage, stroke, or focal mass effect in the brain. Vascular: Normal flow voids. Skull and upper cervical spine: Normal marrow signal. Sinuses/Orbits: Mild paranasal sinus mucosal thickening. Partial left mastoid opacification. Orbits are unremarkable. Other: None. MRA HEAD FINDINGS Severely motion degraded study. No proximal large vessel occlusion or large aneurysm is identified. Suboptimal assessment for small aneurysm or stenosis due to motion artifact. IMPRESSION: 1. Extensive motion artifact. 2. Stable hematoma in the right parietal lobe given differences in technique. Stable associated edema and mass effect with partial effacement of right lateral ventricle and 3 mm right-to-left midline shift. 3. No abnormal enhancement or additional abnormality of the brain identified. 4. MRA degraded by motion artifact. No proximal large vessel occlusion. 5. Consider repeat MRI/MRA when patient is able to hold still. Electronically Signed   By: Kristine Garbe M.D.   On: 07/14/2018 05:08   Mr Jeri Cos WU Contrast  Result Date: 07/14/2018 CLINICAL DATA:  72 y/o  F; cerebral hemorrhage for follow-up. EXAM: MRI HEAD WITHOUT CONTRAST MRA HEAD WITHOUT CONTRAST TECHNIQUE: Multiplanar, multiecho pulse sequences of the brain and surrounding structures were obtained without intravenous contrast. Angiographic images of the head were obtained using MRA technique without contrast. COMPARISON:  07/13/2018 CT head. FINDINGS: MRI HEAD FINDINGS Brain: Motion degradation of multiple sequences. Right parietal hematoma is stable in size given differences in technique measuring approximately 4.4 x 4.9 x 5.4 cm (AP x ML x CC series 22, image 17 and series 30, image 8). Surrounding edema is stable as is local mass effect on the brain partially effacing the atria and temporal horn of right lateral ventricle and resulting in 3 mm right-to-left midline shift. Additionally the small volume  of extra-axial hemorrhage over the right cerebral convexity is stable given differences in technique. There is heterogeneous T1 hyperintensity of blood products. After administration of intravenous contrast there is no appreciable abnormal enhancement. No additional focus of hemorrhage, stroke, or focal mass effect in the brain. Vascular: Normal flow voids. Skull and upper cervical spine: Normal marrow signal. Sinuses/Orbits: Mild paranasal sinus mucosal thickening. Partial left mastoid opacification. Orbits are unremarkable. Other: None. MRA HEAD FINDINGS Severely motion degraded study. No proximal large vessel occlusion or large aneurysm is identified. Suboptimal assessment  for small aneurysm or stenosis due to motion artifact. IMPRESSION: 1. Extensive motion artifact. 2. Stable hematoma in the right parietal lobe given differences in technique. Stable associated edema and mass effect with partial effacement of right lateral ventricle and 3 mm right-to-left midline shift. 3. No abnormal enhancement or additional abnormality of the brain identified. 4. MRA degraded by motion artifact. No proximal large vessel occlusion. 5. Consider repeat MRI/MRA when patient is able to hold still. Electronically Signed   By: Kristine Garbe M.D.   On: 07/14/2018 05:08   Dg Chest Port 1 View  Result Date: 07/13/2018 CLINICAL DATA:  Leukocytosis EXAM: PORTABLE CHEST 1 VIEW COMPARISON:  09/07/2014 FINDINGS: Cardiac shadow is within normal limits. The lungs are well aerated bilaterally. No focal infiltrate or sizable effusion is seen. Chronic changes in the left base are again noted. No bony abnormality is noted. IMPRESSION: No active disease. Electronically Signed   By: Inez Catalina M.D.   On: 07/13/2018 21:46     Assessment/Plan: Diagnosis: Right parietal intracranial hemorrhage in a patient on aspirin with resultant left hemiparesis and left hemisensory deficits, poor sitting balance and mobility. 1. Does the  need for close, 24 hr/day medical supervision in concert with the patient's rehab needs make it unreasonable for this patient to be served in a less intensive setting? Yes 2. Co-Morbidities requiring supervision/potential complications: Essential hypertension, at risk for cerebral edemabladder management, bowel management, safety, skin/wound care, disease management, medication administration, pain management and patient education, does the patient require 24 hr/day rehab nursing? Yes 3. Does the patient require coordinated care of a physician, rehab nurse, PT (1-2 hrs/day, 5 days/week), OT (1-2 hrs/day, 5 days/week) and SLP (.5-1 hrs/day, 5 days/week) to address physical and functional deficits in the context of the above medical diagnosis(es)? Yes Addressing deficits in the following areas: balance, endurance, locomotion, strength, transferring, bowel/bladder control, bathing, dressing, toileting and cognition 4. Can the patient actively participate in an intensive therapy program of at least 3 hrs of therapy per day at least 5 days per week? No 5. The potential for patient to make measurable gains while on inpatient rehab is fair 6. Anticipated functional outcomes upon discharge from inpatient rehab are min assist  with PT, min assist with OT, min assist with SLP. 7. Estimated rehab length of stay to reach the above functional goals is: 21-25d 8. Anticipated D/C setting: Home 9. Anticipated post D/C treatments: HH therapy and Followed by outpatient 10. Overall Rehab/Functional Prognosis: fair  RECOMMENDATIONS: This patient's condition is appropriate for continued rehabilitative care in the following setting: CIR once neurologic status is stable, repeat CT head is cleared by neurology and hypertension controlled off IV medications Patient has agreed to participate in recommended program. Potentially Note that insurance prior authorization may be required for reimbursement for recommended  care.  Comment:  "I have personally performed a face to face diagnostic evaluation of this patient.  Additionally, I have reviewed and concur with the physician assistant's documentation above."  Charlett Blake M.D. [facility] FAAPM&R (Sports Med, Neuromuscular Med) Diplomate Am Board of [place], PA-C 07/15/2018

## 2018-07-15 NOTE — Progress Notes (Signed)
Physical Therapy Treatment Patient Details Name: [name] MRN: [id number] DOB: 06/15/1946 Today's Date: 07/15/2018    History of Present Illness Pt is a 72 y.o. female PMH including dyslipidemia, GERD, hypertension, obesity, PAF.  Presenting with left sided weakness, headache, and nausea/vomiting. MRI/CT revealed high density hematoma centered in the right parietal lobe.    PT Comments    Pt was able to sit EOB today and transfer with maxi sky OOB to the chair.  I believe if her sitting balance gets a bit better we could attempt standing and stand or squat transferring soon.  She has a significant L lateral lean and left leg hypertonia.  PT will continue to follow acutely, asked family to sit on her left side to encourage leftward gaze.   Follow Up Recommendations  CIR;Supervision/Assistance - 24 hour     Equipment Recommendations  Wheelchair (measurements PT);Wheelchair cushion (measurements PT);Hospital bed;3in1 (PT)    Recommendations for Other Services Rehab consult     Precautions / Restrictions Precautions Precautions: Fall Precaution Comments: L visual deficit, L sided weakness    Mobility  Bed Mobility Overal bed mobility: Needs Assistance Bed Mobility: Rolling;Sidelying to Sit Rolling: Mod assist;+2 for physical assistance Sidelying to sit: Mod assist;+2 for physical assistance       General bed mobility comments: Two person physical assist to help pt roll to the left using bed rail and support trunk to get to sitting EOB.  Pt with significant L lateral lean once sitting, so I got her to reach for the foot board on her right side.   Transfers Overall transfer level: Needs assistance               General transfer comment: Pt unable to hold herself up well in sitting, so did not attempt standing today (her first day EOB).  Used the maxi sky for safe OOB transfer and to assist in upright positioning in the chair.   Ambulation/Gait              General Gait Details: unable at this time.        Modified Rankin (Stroke Patients Only) Modified Rankin (Stroke Patients Only) Pre-Morbid Rankin Score: No symptoms Modified Rankin: Severe disability     Balance Overall balance assessment: Needs assistance Sitting-balance support: Feet supported;Single extremity supported Sitting balance-Leahy Scale: Poor Sitting balance - Comments: needs up to mod assist EOB due to left lateral lean.  When asked which way she is leaning pt can tell me, but has difficulty correcting physically.  Postural control: Left lateral lean                                  Cognition Arousal/Alertness: Awake/alert Behavior During Therapy: WFL for tasks assessed/performed Overall Cognitive Status: Impaired/Different from baseline Area of Impairment: Attention;Following commands;Awareness;Problem solving                   Current Attention Level: Selective   Following Commands: Follows one step commands consistently(to the best of her physical ability)   Awareness: Emergent Problem Solving: Difficulty sequencing;Requires verbal cues;Requires tactile cues General Comments: Pt seems very aware of her deficits, clearly can state what is impaired and is generally oriented.  She has some left sided visual issues and some R gaze preference.          General Comments General comments (skin integrity, edema, etc.): Pt with increased tone in left lower leg,  but no reproducable active movement to command (per RN she was lifting it against gravity earlier).       Pertinent Vitals/Pain Pain Assessment: Faces Faces Pain Scale: Hurts whole lot Pain Location: headache Pain Descriptors / Indicators: Headache Pain Intervention(s): Limited activity within patient's tolerance;Monitored during session;Repositioned           PT Goals (current goals can now be found in the care plan section) Acute Rehab PT Goals Patient Stated Goal: agreeable  to rehab Progress towards PT goals: Progressing toward goals    Frequency    Min 4X/week      PT Plan Current plan remains appropriate       AM-PAC PT "6 Clicks" Daily Activity  Outcome Measure  Difficulty turning over in bed (including adjusting bedclothes, sheets and blankets)?: Unable Difficulty moving from lying on back to sitting on the side of the bed? : Unable Difficulty sitting down on and standing up from a chair with arms (e.g., wheelchair, bedside commode, etc,.)?: Unable Help needed moving to and from a bed to chair (including a wheelchair)?: Total Help needed walking in hospital room?: Total Help needed climbing 3-5 steps with a railing? : Total 6 Click Score: 6    End of Session   Activity Tolerance: Patient limited by pain Patient left: in chair;with call bell/phone within reach;with chair alarm set Nurse Communication: Mobility status;Need for lift equipment PT Visit Diagnosis: Other abnormalities of gait and mobility (R26.89);Hemiplegia and hemiparesis Hemiplegia - Right/Left: Left Hemiplegia - dominant/non-dominant: Non-dominant Hemiplegia - caused by: Other Nontraumatic intracranial hemorrhage     Time: 1520-1547 PT Time Calculation (min) (ACUTE ONLY): 27 min  Charges:  $Therapeutic Activity: 8-22 mins $Neuromuscular Re-education: 8-22 mins                    Kelse Ploch B. Butler, [place], DPT [phone]   07/15/2018, 5:42 PM

## 2018-07-15 NOTE — Progress Notes (Signed)
  Echocardiogram 2D Echocardiogram has been performed.  Merrie Roof F 07/15/2018, 2:00 PM

## 2018-07-15 NOTE — Progress Notes (Signed)
STROKE TEAM PROGRESS NOTE   SUBJECTIVE (INTERVAL HISTORY) Her son and sister are at the bedside.  Overall she feels her condition is stable. Still has dense left hemianopia, and left hemiplegia. No acute change from yesterday. Still has right frontal HA, just given tylenol.    OBJECTIVE Temp:  [97.9 F (36.6 C)-98.8 F (37.1 C)] 98.4 F (36.9 C) (08/29 0800) Pulse Rate:  [61-79] 73 (08/29 1100) Cardiac Rhythm: Normal sinus rhythm (08/29 0800) Resp:  [11-29] 19 (08/29 1100) BP: (94-147)/(26-92) 131/66 (08/29 1100) SpO2:  [88 %-98 %] 97 % (08/29 1100)  No results for input(s): GLUCAP in the last 168 hours. Recent Labs  Lab 07/13/18 0940 07/13/18 0959 07/14/18 0350 07/15/18 0305  NA 140 140 140 138  K 3.4* 3.3* 3.1* 3.8  CL 101 100 104 105  CO2 27  --  27 25  GLUCOSE 131* 131* 132* 162*  BUN 17 19 19 16   CREATININE 1.05* 0.90 0.93 0.85  CALCIUM 10.0  --  9.5 9.3  MG  --   --   --  1.9   Recent Labs  Lab 07/13/18 0940  AST 18  ALT 19  ALKPHOS 82  BILITOT 1.0  PROT 6.6  ALBUMIN 4.1   Recent Labs  Lab 07/13/18 0940 07/13/18 0959 07/13/18 1141 07/14/18 0350 07/15/18 0305  WBC 19.1*  --  22.5* 17.0* 15.6*  NEUTROABS 16.5*  --  19.9*  --   --   HGB 15.2* 15.0 16.2* 13.5 13.3  HCT 44.8 44.0 48.2* 40.3 40.1  MCV 88.9  --  88.3 88.6 90.3  PLT 220  --  249 198 195   No results for input(s): CKTOTAL, CKMB, CKMBINDEX, TROPONINI in the last 168 hours. Recent Labs    07/13/18 0940  LABPROT 13.9  INR 1.08   Recent Labs    07/14/18 0201  COLORURINE YELLOW  LABSPEC 1.018  PHURINE 5.0  GLUCOSEU NEGATIVE  HGBUR SMALL*  BILIRUBINUR NEGATIVE  KETONESUR NEGATIVE  PROTEINUR NEGATIVE  NITRITE POSITIVE*  LEUKOCYTESUR SMALL*       Component Value Date/Time   CHOL 97 07/14/2018 0353   TRIG 110 07/14/2018 0353   HDL 26 (L) 07/14/2018 0353   CHOLHDL 3.7 07/14/2018 0353   VLDL 22 07/14/2018 0353   LDLCALC 49 07/14/2018 0353   Lab Results  Component Value Date    HGBA1C 5.6 07/14/2018   No results found for: LABOPIA, COCAINSCRNUR, LABBENZ, AMPHETMU, THCU, LABBARB  No results for input(s): ETH in the last 168 hours.  I have personally reviewed the radiological images below and agree with the radiology interpretations.  Ct Head Wo Contrast  Result Date: 07/13/2018 CLINICAL DATA:  Worsening headaches. History of intracranial hemorrhage, hypertension, atrial fibrillation. EXAM: CT HEAD WITHOUT CONTRAST TECHNIQUE: Contiguous axial images were obtained from the base of the skull through the vertex without intravenous contrast. COMPARISON:  CT HEAD July 13, 2018 FINDINGS: BRAIN: 4.4 x 4.7 x 5.3 cm (volume = 57 cc) RIGHT parietal hematoma with similar surrounding low-density vasogenic edema. Extra-axial extension, small volume RIGHT subarachnoid hemorrhage and 5 mm RIGHT subdural hematoma. Mass effect on RIGHT cerebrum without midline shift. No parenchymal brain volume loss for age. No acute large vascular territory infarcts. Basal cisterns are patent. VASCULAR: Minimal calcific atherosclerosis of the carotid siphons. SKULL: No skull fracture. No significant scalp soft tissue swelling. SINUSES/ORBITS: Trace paranasal sinus mucosal thickening. Mastoid air cells are well aerated.The included ocular globes and orbital contents are non-suspicious. OTHER: None. IMPRESSION: 1. 57  cc acute RIGHT parietal hematoma was 50 cc. Similar regional mass effect without midline shift. 2. Similar small volume RIGHT subarachnoid hemorrhage and 5 mm RIGHT subdural hematoma. Electronically Signed   By: Elon Alas M.D.   On: 07/13/2018 18:12   Ct Head Wo Contrast  Result Date: 07/13/2018 CLINICAL DATA:  Focal neuro deficit. Right parietal headache for 2-3 days. EXAM: CT HEAD WITHOUT CONTRAST TECHNIQUE: Contiguous axial images were obtained from the base of the skull through the vertex without intravenous contrast. COMPARISON:  None. FINDINGS: Brain: 4.7 x 4.2 x 5.3 cm  high-density hematoma centered in the right parietal lobe with a thin rim of edema. Shape is irregular, best estimate at volume is 50 cc. No gross underlying lesion. No low-density separate from the hematoma to suggest primary infarct. The sagittal sinuses have normal density. No trauma history. No indication of chronic small vessel disease. There is small volume regional subarachnoid hemorrhage. Small volume of subdural blood is seen along the right frontal convexity measuring up to 4 mm thickness. There is local mass effect without hydrocephalus or entrapment. Vascular: No hyperdense vessel or unexpected calcification. Skull: Negative for fracture Sinuses/Orbits: Negative Other: Critical Value/emergent results were called by telephone at the time of interpretation on 07/13/2018 at 9:43 am to Dr. Marda Stalker , who verbally acknowledged these results. IMPRESSION: 50 cc acute hematoma centered in the right parietal lobe. There is small volume local subarachnoid and subdural extension. Local mass effect without hydrocephalus or midline shift. No detectable underlying cause. Electronically Signed   By: Monte Fantasia M.D.   On: 07/13/2018 09:46   Mr Jodene Nam Head Wo Contrast  Result Date: 07/14/2018 CLINICAL DATA:  72 y/o  F; cerebral hemorrhage for follow-up. EXAM: MRI HEAD WITHOUT CONTRAST MRA HEAD WITHOUT CONTRAST TECHNIQUE: Multiplanar, multiecho pulse sequences of the brain and surrounding structures were obtained without intravenous contrast. Angiographic images of the head were obtained using MRA technique without contrast. COMPARISON:  07/13/2018 CT head. FINDINGS: MRI HEAD FINDINGS Brain: Motion degradation of multiple sequences. Right parietal hematoma is stable in size given differences in technique measuring approximately 4.4 x 4.9 x 5.4 cm (AP x ML x CC series 22, image 17 and series 30, image 8). Surrounding edema is stable as is local mass effect on the brain partially effacing the atria and  temporal horn of right lateral ventricle and resulting in 3 mm right-to-left midline shift. Additionally the small volume of extra-axial hemorrhage over the right cerebral convexity is stable given differences in technique. There is heterogeneous T1 hyperintensity of blood products. After administration of intravenous contrast there is no appreciable abnormal enhancement. No additional focus of hemorrhage, stroke, or focal mass effect in the brain. Vascular: Normal flow voids. Skull and upper cervical spine: Normal marrow signal. Sinuses/Orbits: Mild paranasal sinus mucosal thickening. Partial left mastoid opacification. Orbits are unremarkable. Other: None. MRA HEAD FINDINGS Severely motion degraded study. No proximal large vessel occlusion or large aneurysm is identified. Suboptimal assessment for small aneurysm or stenosis due to motion artifact. IMPRESSION: 1. Extensive motion artifact. 2. Stable hematoma in the right parietal lobe given differences in technique. Stable associated edema and mass effect with partial effacement of right lateral ventricle and 3 mm right-to-left midline shift. 3. No abnormal enhancement or additional abnormality of the brain identified. 4. MRA degraded by motion artifact. No proximal large vessel occlusion. 5. Consider repeat MRI/MRA when patient is able to hold still. Electronically Signed   By: Kristine Garbe M.D.   On: 07/14/2018  05:08   Mr Jeri Cos DQ Contrast  Result Date: 07/14/2018 CLINICAL DATA:  72 y/o  F; cerebral hemorrhage for follow-up. EXAM: MRI HEAD WITHOUT CONTRAST MRA HEAD WITHOUT CONTRAST TECHNIQUE: Multiplanar, multiecho pulse sequences of the brain and surrounding structures were obtained without intravenous contrast. Angiographic images of the head were obtained using MRA technique without contrast. COMPARISON:  07/13/2018 CT head. FINDINGS: MRI HEAD FINDINGS Brain: Motion degradation of multiple sequences. Right parietal hematoma is stable in size  given differences in technique measuring approximately 4.4 x 4.9 x 5.4 cm (AP x ML x CC series 22, image 17 and series 30, image 8). Surrounding edema is stable as is local mass effect on the brain partially effacing the atria and temporal horn of right lateral ventricle and resulting in 3 mm right-to-left midline shift. Additionally the small volume of extra-axial hemorrhage over the right cerebral convexity is stable given differences in technique. There is heterogeneous T1 hyperintensity of blood products. After administration of intravenous contrast there is no appreciable abnormal enhancement. No additional focus of hemorrhage, stroke, or focal mass effect in the brain. Vascular: Normal flow voids. Skull and upper cervical spine: Normal marrow signal. Sinuses/Orbits: Mild paranasal sinus mucosal thickening. Partial left mastoid opacification. Orbits are unremarkable. Other: None. MRA HEAD FINDINGS Severely motion degraded study. No proximal large vessel occlusion or large aneurysm is identified. Suboptimal assessment for small aneurysm or stenosis due to motion artifact. IMPRESSION: 1. Extensive motion artifact. 2. Stable hematoma in the right parietal lobe given differences in technique. Stable associated edema and mass effect with partial effacement of right lateral ventricle and 3 mm right-to-left midline shift. 3. No abnormal enhancement or additional abnormality of the brain identified. 4. MRA degraded by motion artifact. No proximal large vessel occlusion. 5. Consider repeat MRI/MRA when patient is able to hold still. Electronically Signed   By: Kristine Garbe M.D.   On: 07/14/2018 05:08   Dg Chest Port 1 View  Result Date: 07/13/2018 CLINICAL DATA:  Leukocytosis EXAM: PORTABLE CHEST 1 VIEW COMPARISON:  09/07/2014 FINDINGS: Cardiac shadow is within normal limits. The lungs are well aerated bilaterally. No focal infiltrate or sizable effusion is seen. Chronic changes in the left base are  again noted. No bony abnormality is noted. IMPRESSION: No active disease. Electronically Signed   By: Inez Catalina M.D.   On: 07/13/2018 21:46    TTE pending  CT head pending   PHYSICAL EXAM  Temp:  [97.9 F (36.6 C)-98.8 F (37.1 C)] 98.4 F (36.9 C) (08/29 0800) Pulse Rate:  [61-79] 73 (08/29 1100) Resp:  [11-29] 19 (08/29 1100) BP: (94-147)/(26-92) 131/66 (08/29 1100) SpO2:  [88 %-98 %] 97 % (08/29 1100)  General - Well nourished, well developed, in mild distress due to HA.  Ophthalmologic - fundi not visualized due to noncooperation.  Cardiovascular - Regular rate and rhythm with no murmur.  Mental Status -  Level of arousal and orientation to time, place, and person were intact. Language including expression, naming, repetition, comprehension was assessed and found intact. Fund of Knowledge was assessed and was intact.  Cranial Nerves II - XII - II - dense left hemianopia. III, IV, VI - Extraocular movements intact. V - Facial sensation intact bilaterally. VII - left nasolabial fold flattening. VIII - Hearing & vestibular intact bilaterally. X - Palate elevates symmetrically, no dysarthria. XI - Chin turning & shoulder shrug intact bilaterally. XII - Tongue protrusion intact.  Motor Strength - The patient's strength was normal in RUE and  RLE. However, left UE 0/5 and LLE 0/5 iliopsoas, 2/5 knee flexion, 0/5 distally.  Bulk was normal and fasciculations were absent.   Motor Tone - Muscle tone was assessed at the neck and appendages and was normal.  Reflexes - The patient's reflexes were symmetrical in all extremities and she had left babinski.  Sensory - Light touch, temperature/pinprick were assessed and were decreased on the left, about 50% of right LE and 75% of right UE.  Coordination - The patient had normal movements in the right hands with no ataxia or dysmetria.  Tremor was absent.  Gait and Station - deferred.   ASSESSMENT/PLAN [name]  is a 72 y.o. female with history of PAF, HLD, HTN, obesity admitted for HA, vomiting after colonoscopy. CT found to have right occipitoparietal large ICH. No tPA given due to [place].    ICH:  right occipitoparietal ICH, likely related to spiking hypertension 2/2 vomiting with colonoscopy.   Resultant left hemianopia and left hemiplegia  CT head right occipitoparietal ICH 50cc  CT head repeat with hematoma to 57cc  MRI  Right occipitoparietal ICH, stable with focal mass effects  MRA  No LVO but motion degraded  repeat MRI brain with and without contrast as well as MRA head once ICH resolves to rule out malignancy or AVM or aneurysm  2D Echo  pending  LDL 49  HgbA1c 5.6  SCDs for VTE prophylaxis  aspirin 81 mg daily prior to admission, now on No antithrombotic.   Ongoing aggressive stroke risk factor management  Therapy recommendations:  CIR  Disposition:  Pending   Cerebral edema  60mm midline shift on MRI  Repeat CT in am  No need of 3% at this time  NSG on board  Close monitoring neuro check  Na 140->138  PAF  On flecainide   On metoprolol  Currently NSR  On ASA PTA, now on no antithrombotic due to [place]  Hypertensive emergency Stable On low dose cardene Resume home metoprolol 25mg  bid Wean off cardene as able  BP goal < 160  Tobacco abuse  Current smoker  Smoking cessation counseling provided  Pt is willing to quit  Other Stroke Risk Factors  Advanced age  Obesity, Body mass index is 31.99 kg/m.   Other Active Problems  Colon polyps  Leukocytosis - 19.1->22.5->17.0->15.6  Hypokalemia - supplement 3.1->3.8  Hospital day # 2  This patient is critically ill due to large ICH, hypertensive emergency, PAF and at significant risk of neurological worsening, death form hematoma expansion, ischemic stroke, heart failure, hypertensive encephalopathy. This patient's care requires constant monitoring of vital signs, hemodynamics, respiratory and  cardiac monitoring, review of multiple databases, neurological assessment, discussion with family, other specialists and medical decision making of high complexity. I spent 35 minutes of neurocritical care time in the care of this patient. I had long discussion with pt and son at bedside, updated pt current condition, treatment plan and potential prognosis. They expressed understanding and appreciation.   Rosalin Hawking, MD PhD Stroke Neurology 07/15/2018 11:22 AM    To contact Stroke Continuity provider, please refer to [email or web address] After hours, contact General Neurology

## 2018-07-16 ENCOUNTER — Inpatient Hospital Stay (HOSPITAL_COMMUNITY): Payer: Medicare Other

## 2018-07-16 DIAGNOSIS — E876 Hypokalemia: Secondary | ICD-10-CM

## 2018-07-16 DIAGNOSIS — E785 Hyperlipidemia, unspecified: Secondary | ICD-10-CM

## 2018-07-16 LAB — BASIC METABOLIC PANEL
Anion gap: 9 (ref 5–15)
BUN: 16 mg/dL (ref 8–23)
CHLORIDE: 101 mmol/L (ref 98–111)
CO2: 26 mmol/L (ref 22–32)
CREATININE: 0.83 mg/dL (ref 0.44–1.00)
Calcium: 9.6 mg/dL (ref 8.9–10.3)
GFR calc Af Amer: >60 mL/min (ref >60)
GFR calc non Af Amer: >60 mL/min (ref >60)
GLUCOSE: 96 mg/dL (ref 70–99)
POTASSIUM: 3.6 mmol/L (ref 3.5–5.1)
Sodium: 136 mmol/L (ref 135–145)

## 2018-07-16 LAB — CBC
HCT: 42.7 % (ref 36.0–46.0)
HEMOGLOBIN: 14.6 g/dL (ref 12.0–15.0)
MCH: 29.7 pg (ref 26.0–34.0)
MCHC: 34.2 g/dL (ref 30.0–36.0)
MCV: 87 fL (ref 78.0–100.0)
Platelets: 194 10*3/uL (ref 150–400)
RBC: 4.91 MIL/uL (ref 3.87–5.11)
RDW: 12.7 % (ref 11.5–15.5)
WBC: 10.6 10*3/uL — ABNORMAL HIGH (ref 4.0–10.5)

## 2018-07-16 MED ORDER — SERTRALINE HCL 50 MG PO TABS
50.0000 mg | ORAL_TABLET | Freq: Every day | ORAL | Status: DC
  Administered 2018-07-17 – 2018-07-19 (×3): 50 mg via ORAL
  Filled 2018-07-16 (×3): qty 1

## 2018-07-16 MED ORDER — LABETALOL HCL 5 MG/ML IV SOLN
5.0000 mg | INTRAVENOUS | Status: DC | PRN
  Administered 2018-07-16: 10 mg via INTRAVENOUS
  Administered 2018-07-16: 5 mg via INTRAVENOUS
  Administered 2018-07-17 (×2): 10 mg via INTRAVENOUS
  Filled 2018-07-16 (×4): qty 4

## 2018-07-16 NOTE — Progress Notes (Signed)
  Speech Language Pathology Treatment: Cognitive-Linquistic  Patient Details Name: ORLY QUIMBY MRN: [id number] DOB: [birth year] Today's Date: 07/16/2018 Time: 1355-1410 SLP Time Calculation (min) (ACUTE ONLY): 15 min  Assessment / Plan / Recommendation Clinical Impression  Session targeted visual and compensatory strategies for attention to left visual field and scanning while reading. Provided moderate fading to min verbal cues to use left arm as a visual anchor on tray. In a reading task pt used a "ruler"  and finger tracking to guide her vision left and right, up and down to find events on a schedule with max fading to min verbal cues over 4 trials. Pt given materials to practice over the weekend though no family present for education today.   HPI HPI: DENIESHA STENGLEIN is a 72 y.o. female past medical history of dyslipidemia, GERD, hypertension, obesity, PAF.  Patient was noted to have a headache this past Saturday and had bouts of vomiting.  On 07/12/2018 she appeared normal until approximately 1300 hrs. when she was brought back to go get her colonoscopy.  In PACU it was noted that she was not moving her left side.  Patient was noted not to be able to get out of bed this morning and have severe left-sided weakness thus she was brought to the emergency department.  CT of the brain was obtained showing a 4.7 x 4.2 x 5.3 cm high density hematoma centered in the right parietal lobe with a thin rim of edema.        SLP Plan  Continue with current plan of care       Recommendations                   Plan: Continue with current plan of care       GO               [facility], MA CCC-SLP [phone]  Lynann Beaver 07/16/2018, 2:54 PM

## 2018-07-16 NOTE — Progress Notes (Addendum)
STROKE TEAM PROGRESS NOTE   SUBJECTIVE (INTERVAL HISTORY) Her son and daughter are at the bedside.  Overall she feels her condition is stable. Still has dense left hemianopia, and left hemiplegia but left LE now 2+/5. No acute change from yesterday. CT head repeat this am stable hematoma and mild progression of cerebral edema    OBJECTIVE Temp:  [97.6 F (36.4 C)-98.7 F (37.1 C)] 97.6 F (36.4 C) (08/30 0400) Pulse Rate:  [58-87] 71 (08/30 0700) Cardiac Rhythm: Normal sinus rhythm (08/29 1600) Resp:  [16-26] 22 (08/30 0300) BP: (107-158)/(42-106) 138/68 (08/30 0700) SpO2:  [91 %-99 %] 97 % (08/30 0700)  No results for input(s): GLUCAP in the last 168 hours. Recent Labs  Lab 07/13/18 0940 07/13/18 0959 07/14/18 0350 07/15/18 0305 07/16/18 0316  NA 140 140 140 138 136  K 3.4* 3.3* 3.1* 3.8 3.6  CL 101 100 104 105 101  CO2 27  --  27 25 26   GLUCOSE 131* 131* 132* 162* 96  BUN 17 19 19 16 16   CREATININE 1.05* 0.90 0.93 0.85 0.83  CALCIUM 10.0  --  9.5 9.3 9.6  MG  --   --   --  1.9  --    Recent Labs  Lab 07/13/18 0940  AST 18  ALT 19  ALKPHOS 82  BILITOT 1.0  PROT 6.6  ALBUMIN 4.1   Recent Labs  Lab 07/13/18 0940 07/13/18 0959 07/13/18 1141 07/14/18 0350 07/15/18 0305 07/16/18 0316  WBC 19.1*  --  22.5* 17.0* 15.6* 10.6*  NEUTROABS 16.5*  --  19.9*  --   --   --   HGB 15.2* 15.0 16.2* 13.5 13.3 14.6  HCT 44.8 44.0 48.2* 40.3 40.1 42.7  MCV 88.9  --  88.3 88.6 90.3 87.0  PLT 220  --  249 198 195 194   No results for input(s): CKTOTAL, CKMB, CKMBINDEX, TROPONINI in the last 168 hours. Recent Labs    07/13/18 0940  LABPROT 13.9  INR 1.08   Recent Labs    07/14/18 0201  COLORURINE YELLOW  LABSPEC 1.018  PHURINE 5.0  GLUCOSEU NEGATIVE  HGBUR SMALL*  BILIRUBINUR NEGATIVE  KETONESUR NEGATIVE  PROTEINUR NEGATIVE  NITRITE POSITIVE*  LEUKOCYTESUR SMALL*       Component Value Date/Time   CHOL 97 07/14/2018 0353   TRIG 110 07/14/2018 0353   HDL  26 (L) 07/14/2018 0353   CHOLHDL 3.7 07/14/2018 0353   VLDL 22 07/14/2018 0353   LDLCALC 49 07/14/2018 0353   Lab Results  Component Value Date   HGBA1C 5.6 07/14/2018   No results found for: LABOPIA, COCAINSCRNUR, LABBENZ, AMPHETMU, THCU, LABBARB  No results for input(s): ETH in the last 168 hours.  I have personally reviewed the radiological images below and agree with the radiology interpretations.  Ct Head Wo Contrast  Result Date: 07/16/2018 CLINICAL DATA:  72 y/o  F; follow-up of intracranial hemorrhage. EXAM: CT HEAD WITHOUT CONTRAST TECHNIQUE: Contiguous axial images were obtained from the base of the skull through the vertex without intravenous contrast. COMPARISON:  07/13/2018 CT head.  07/14/2018 MRI head. FINDINGS: Brain: Right parietal hematoma measures 4.9 x 4.7 x 5.2 cm (volume = 63 cm^3) measured in a similar fashion to the prior CT of head, previously 57 cc. Edema surrounding the hematoma is mildly increased as is local mass effect effacing the posterior right lateral ventricle resulting in 4 mm of right-to-left midline shift. Interval partial dispersion of subarachnoid hemorrhage over the right cerebral convexity. No  new focus of intracranial hemorrhage or focal mass effect identified. Vascular: No hyperdense vessel or unexpected calcification. Skull: Normal. Negative for fracture or focal lesion. Sinuses/Orbits: No acute finding. Other: None. IMPRESSION: 1. Right parietal hematoma is stable to minimally increased in size in comparison with the prior CT of the head given differences in slice selection measuring 63 cc. 2. Interval partial dispersion of subarachnoid hemorrhage over the right cerebral convexity. 3. Mild interval increase in edema surrounding the hematoma as well as associated mass effect with 4 mm right-to-left midline shift. Electronically Signed   By: Kristine Garbe M.D.   On: 07/16/2018 04:40   Ct Head Wo Contrast  Result Date: 07/13/2018 CLINICAL  DATA:  Worsening headaches. History of intracranial hemorrhage, hypertension, atrial fibrillation. EXAM: CT HEAD WITHOUT CONTRAST TECHNIQUE: Contiguous axial images were obtained from the base of the skull through the vertex without intravenous contrast. COMPARISON:  CT HEAD July 13, 2018 FINDINGS: BRAIN: 4.4 x 4.7 x 5.3 cm (volume = 57 cc) RIGHT parietal hematoma with similar surrounding low-density vasogenic edema. Extra-axial extension, small volume RIGHT subarachnoid hemorrhage and 5 mm RIGHT subdural hematoma. Mass effect on RIGHT cerebrum without midline shift. No parenchymal brain volume loss for age. No acute large vascular territory infarcts. Basal cisterns are patent. VASCULAR: Minimal calcific atherosclerosis of the carotid siphons. SKULL: No skull fracture. No significant scalp soft tissue swelling. SINUSES/ORBITS: Trace paranasal sinus mucosal thickening. Mastoid air cells are well aerated.The included ocular globes and orbital contents are non-suspicious. OTHER: None. IMPRESSION: 1. 57 cc acute RIGHT parietal hematoma was 50 cc. Similar regional mass effect without midline shift. 2. Similar small volume RIGHT subarachnoid hemorrhage and 5 mm RIGHT subdural hematoma. Electronically Signed   By: Elon Alas M.D.   On: 07/13/2018 18:12   Ct Head Wo Contrast  Result Date: 07/13/2018 CLINICAL DATA:  Focal neuro deficit. Right parietal headache for 2-3 days. EXAM: CT HEAD WITHOUT CONTRAST TECHNIQUE: Contiguous axial images were obtained from the base of the skull through the vertex without intravenous contrast. COMPARISON:  None. FINDINGS: Brain: 4.7 x 4.2 x 5.3 cm high-density hematoma centered in the right parietal lobe with a thin rim of edema. Shape is irregular, best estimate at volume is 50 cc. No gross underlying lesion. No low-density separate from the hematoma to suggest primary infarct. The sagittal sinuses have normal density. No trauma history. No indication of chronic small vessel  disease. There is small volume regional subarachnoid hemorrhage. Small volume of subdural blood is seen along the right frontal convexity measuring up to 4 mm thickness. There is local mass effect without hydrocephalus or entrapment. Vascular: No hyperdense vessel or unexpected calcification. Skull: Negative for fracture Sinuses/Orbits: Negative Other: Critical Value/emergent results were called by telephone at the time of interpretation on 07/13/2018 at 9:43 am to Dr. Marda Stalker , who verbally acknowledged these results. IMPRESSION: 50 cc acute hematoma centered in the right parietal lobe. There is small volume local subarachnoid and subdural extension. Local mass effect without hydrocephalus or midline shift. No detectable underlying cause. Electronically Signed   By: Monte Fantasia M.D.   On: 07/13/2018 09:46   Mr Jodene Nam Head Wo Contrast  Result Date: 07/14/2018 CLINICAL DATA:  72 y/o  F; cerebral hemorrhage for follow-up. EXAM: MRI HEAD WITHOUT CONTRAST MRA HEAD WITHOUT CONTRAST TECHNIQUE: Multiplanar, multiecho pulse sequences of the brain and surrounding structures were obtained without intravenous contrast. Angiographic images of the head were obtained using MRA technique without contrast. COMPARISON:  07/13/2018 CT head.  FINDINGS: MRI HEAD FINDINGS Brain: Motion degradation of multiple sequences. Right parietal hematoma is stable in size given differences in technique measuring approximately 4.4 x 4.9 x 5.4 cm (AP x ML x CC series 22, image 17 and series 30, image 8). Surrounding edema is stable as is local mass effect on the brain partially effacing the atria and temporal horn of right lateral ventricle and resulting in 3 mm right-to-left midline shift. Additionally the small volume of extra-axial hemorrhage over the right cerebral convexity is stable given differences in technique. There is heterogeneous T1 hyperintensity of blood products. After administration of intravenous contrast there is no  appreciable abnormal enhancement. No additional focus of hemorrhage, stroke, or focal mass effect in the brain. Vascular: Normal flow voids. Skull and upper cervical spine: Normal marrow signal. Sinuses/Orbits: Mild paranasal sinus mucosal thickening. Partial left mastoid opacification. Orbits are unremarkable. Other: None. MRA HEAD FINDINGS Severely motion degraded study. No proximal large vessel occlusion or large aneurysm is identified. Suboptimal assessment for small aneurysm or stenosis due to motion artifact. IMPRESSION: 1. Extensive motion artifact. 2. Stable hematoma in the right parietal lobe given differences in technique. Stable associated edema and mass effect with partial effacement of right lateral ventricle and 3 mm right-to-left midline shift. 3. No abnormal enhancement or additional abnormality of the brain identified. 4. MRA degraded by motion artifact. No proximal large vessel occlusion. 5. Consider repeat MRI/MRA when patient is able to hold still. Electronically Signed   By: Kristine Garbe M.D.   On: 07/14/2018 05:08   Mr Jeri Cos TK Contrast  Result Date: 07/14/2018 CLINICAL DATA:  72 y/o  F; cerebral hemorrhage for follow-up. EXAM: MRI HEAD WITHOUT CONTRAST MRA HEAD WITHOUT CONTRAST TECHNIQUE: Multiplanar, multiecho pulse sequences of the brain and surrounding structures were obtained without intravenous contrast. Angiographic images of the head were obtained using MRA technique without contrast. COMPARISON:  07/13/2018 CT head. FINDINGS: MRI HEAD FINDINGS Brain: Motion degradation of multiple sequences. Right parietal hematoma is stable in size given differences in technique measuring approximately 4.4 x 4.9 x 5.4 cm (AP x ML x CC series 22, image 17 and series 30, image 8). Surrounding edema is stable as is local mass effect on the brain partially effacing the atria and temporal horn of right lateral ventricle and resulting in 3 mm right-to-left midline shift. Additionally the  small volume of extra-axial hemorrhage over the right cerebral convexity is stable given differences in technique. There is heterogeneous T1 hyperintensity of blood products. After administration of intravenous contrast there is no appreciable abnormal enhancement. No additional focus of hemorrhage, stroke, or focal mass effect in the brain. Vascular: Normal flow voids. Skull and upper cervical spine: Normal marrow signal. Sinuses/Orbits: Mild paranasal sinus mucosal thickening. Partial left mastoid opacification. Orbits are unremarkable. Other: None. MRA HEAD FINDINGS Severely motion degraded study. No proximal large vessel occlusion or large aneurysm is identified. Suboptimal assessment for small aneurysm or stenosis due to motion artifact. IMPRESSION: 1. Extensive motion artifact. 2. Stable hematoma in the right parietal lobe given differences in technique. Stable associated edema and mass effect with partial effacement of right lateral ventricle and 3 mm right-to-left midline shift. 3. No abnormal enhancement or additional abnormality of the brain identified. 4. MRA degraded by motion artifact. No proximal large vessel occlusion. 5. Consider repeat MRI/MRA when patient is able to hold still. Electronically Signed   By: Kristine Garbe M.D.   On: 07/14/2018 05:08   Dg Chest Port 1 View  Result Date:  07/13/2018 CLINICAL DATA:  Leukocytosis EXAM: PORTABLE CHEST 1 VIEW COMPARISON:  09/07/2014 FINDINGS: Cardiac shadow is within normal limits. The lungs are well aerated bilaterally. No focal infiltrate or sizable effusion is seen. Chronic changes in the left base are again noted. No bony abnormality is noted. IMPRESSION: No active disease. Electronically Signed   By: Inez Catalina M.D.   On: 07/13/2018 21:46   TTE - Left ventricle: The cavity size was normal. There was moderate   concentric hypertrophy. Systolic function was vigorous. The   estimated ejection fraction was in the range of 65% to 70%.  Wall   motion was normal; there were no regional wall motion   abnormalities. - Aortic valve: There was no regurgitation. - Mitral valve: There was mild regurgitation. - Left atrium: The atrium was moderately dilated. - Right ventricle: The cavity size was normal. Wall thickness was   normal. Systolic function was normal. - Right atrium: The atrium was moderately dilated. - Tricuspid valve: There was moderate regurgitation. - Pulmonary arteries: Systolic pressure was moderately increased.   PA peak pressure: 47 mm Hg (S). - Inferior vena cava: The vessel was normal in size. Impressions: - No cardiac source of emboli was indentified.   PHYSICAL EXAM  Temp:  [97.6 F (36.4 C)-98.7 F (37.1 C)] 97.6 F (36.4 C) (08/30 0400) Pulse Rate:  [58-87] 71 (08/30 0700) Resp:  [16-26] 22 (08/30 0300) BP: (107-158)/(42-106) 138/68 (08/30 0700) SpO2:  [91 %-99 %] 97 % (08/30 0700)  General - Well nourished, well developed, not in distress  Ophthalmologic - fundi not visualized due to noncooperation.  Cardiovascular - Regular rate and rhythm with no murmur.  Mental Status -  Level of arousal and orientation to time, place, and person were intact. Language including expression, naming, repetition, comprehension was assessed and found intact. Fund of Knowledge was assessed and was intact.  Cranial Nerves II - XII - II - dense left hemianopia. III, IV, VI - Extraocular movements intact. V - Facial sensation intact bilaterally. VII - left nasolabial fold flattening. VIII - Hearing & vestibular intact bilaterally. X - Palate elevates symmetrically, no dysarthria. XI - Chin turning & shoulder shrug intact bilaterally. XII - Tongue protrusion intact.  Motor Strength - The patient's strength was normal in RUE and RLE. However, left UE 0/5 and LLE 2+/5 iliopsoas, 2/5 knee flexion, 0/5 distally.  Bulk was normal and fasciculations were absent.   Motor Tone - Muscle tone was assessed at the  neck and appendages and was normal.  Reflexes - The patient's reflexes were symmetrical in all extremities and she had left babinski.  Sensory - Light touch, temperature/pinprick were assessed and were decreased on the left, about 50% of right LE and 75% of right UE.  Coordination - The patient had normal movements in the right hands with no ataxia or dysmetria.  Tremor was absent.  Gait and Station - deferred.   ASSESSMENT/PLAN Ms. ARVADA SEABORN is a 72 y.o. female with history of PAF, HLD, HTN, obesity admitted for HA, vomiting after colonoscopy. CT found to have right occipitoparietal large ICH. No tPA given due to [place].    ICH:  right occipitoparietal ICH, likely related to spiking hypertension 2/2 vomiting with colonoscopy.   Resultant left hemianopia and left hemiplegia  CT head right occipitoparietal ICH 50cc  CT head repeat x 2 with stable hematoma  MRI  Right occipitoparietal ICH, stable with focal mass effects  MRA  No LVO but motion degraded  repeat  MRI brain with and without contrast as well as MRA head once ICH resolves to rule out malignancy or AVM or aneurysm  2D Echo EF 65-70%  LDL 49  HgbA1c 5.6  SCDs for VTE prophylaxis  aspirin 81 mg daily prior to admission, now on No antithrombotic.   Ongoing aggressive stroke risk factor management  Therapy recommendations:  CIR  Disposition:  Pending   Cerebral edema  68mm midline shift on MRI  Repeat CT 07/16/18 stable hematoma with midline shift at 64mm  No need of 3% at this time  NSG on board  Close monitoring neuro check  Na 140->138->136  PAF  On flecainide   On metoprolol  Currently NSR  On ASA PTA, now on no antithrombotic due to [place]  Hypertensive emergency Stable Off cardene on home metoprolol 25mg  bid  BP goal < 160  Tobacco abuse  Current smoker  Smoking cessation counseling provided  Pt is willing to quit  Other Stroke Risk Factors  Advanced age  Obesity, Body  mass index is 31.99 kg/m.   Other Active Problems  Colon polyps  Leukocytosis - 19.1->22.5->17.0->15.6->10.6  Hypokalemia - supplement 3.1->3.8->3.6  UA WBC 21-50 - asymptomatic - urine culture pending  Hospital day # 3  This patient is critically ill due to large ICH, hypertensive emergency, PAF and at significant risk of neurological worsening, death form hematoma expansion, ischemic stroke, heart failure, hypertensive encephalopathy. This patient's care requires constant monitoring of vital signs, hemodynamics, respiratory and cardiac monitoring, review of multiple databases, neurological assessment, discussion with family, other specialists and medical decision making of high complexity. I spent 35 minutes of neurocritical care time in the care of this patient. I had long discussion with pt and son and daughter at bedside, updated pt current condition, treatment plan and potential prognosis. They expressed understanding and appreciation.   Rosalin Hawking, MD PhD Stroke Neurology 07/16/2018 10:47 AM     To contact Stroke Continuity provider, please refer to [email or web address] After hours, contact General Neurology

## 2018-07-16 NOTE — Progress Notes (Signed)
Occupational Therapy Treatment Patient Details Name: [name] MRN: [id number] DOB: [birth year] Today's Date: 07/16/2018    History of present illness Pt is a 72 y.o. female PMH including dyslipidemia, GERD, hypertension, obesity, PAF.  Presenting with left sided weakness, headache, and nausea/vomiting. MRI/CT revealed high density hematoma centered in the right parietal lobe.   OT comments  Pt demonstrates stand pivot to chair on Right side. Pt unable to complete in static standing and scissoring feet with transfer. Pt tolerating the chair eating chips and locating the chips on the left side. Pt happy to be OOB in the chair.    Follow Up Recommendations  CIR;Supervision/Assistance - 24 hour    Equipment Recommendations  Other (comment)    Recommendations for Other Services Rehab consult    Precautions / Restrictions Precautions Precautions: Fall Precaution Comments: L visual deficit, L sided weakness       Mobility Bed Mobility Overal bed mobility: Needs Assistance Bed Mobility: Rolling;Sidelying to Sit Rolling: Mod assist Sidelying to sit: Mod assist       General bed mobility comments: cues to reach with R hand to L rail and assist to place hand on rail, then to get L leg off bed and to lift trunk  Transfers Overall transfer level: Needs assistance Equipment used: None Transfers: Sit to/from Stand;Stand Pivot Transfers Sit to Stand: +2 physical assistance;Max assist Stand pivot transfers: +2 physical assistance;Max assist       General transfer comment: maxisky placed in the chair and encourage RN staff to use the lift for transfers    Balance Overall balance assessment: Needs assistance Sitting-balance support: Feet supported;Single extremity supported Sitting balance-Leahy Scale: Poor Sitting balance - Comments: able to fix posture and alignment momentarily with min to mod verbal and tactile cues.  needs increased time to notice items on L  side Postural control: Left lateral lean;Other (comment)(anterior lean)   Standing balance-Leahy Scale: Poor Standing balance comment: +2 A in standing for pivot to chair                           ADL either performed or assessed with clinical judgement   ADL Overall ADL's : Needs assistance/impaired Eating/Feeding: Set up Eating/Feeding Details (indicate cue type and reason): pt with chips placed on left side and required to scan to the L to locate the chips. pt states "where did she put my chips" pt again oriented to placement pt able to recall and find chips the second attempt                 Lower Body Dressing: Maximal assistance   Toilet Transfer: +2 for safety/equipment;Maximal assistance;+2 for physical assistance Toilet Transfer Details (indicate cue type and reason): simulated EOB to chair            General ADL Comments: pt attempting OOB to chair with stand pivot this session. pt requires cues and tactile input at EOB to work on static sitting balance. pt able to complete transfer this session with mod cues     Vision   Additional Comments: R gaze prefence. able to scan L with cues   Perception     Praxis      Cognition Arousal/Alertness: Awake/alert Behavior During Therapy: WFL for tasks assessed/performed Overall Cognitive Status: Impaired/Different from baseline Area of Impairment: Attention;Following commands;Awareness;Problem solving                   Current Attention Level: Selective  Following Commands: Follows one step commands consistently     Problem Solving: Difficulty sequencing;Requires verbal cues;Requires tactile cues General Comments: pt attempting a search and find task on table. pt using a bright yellow piece of paper . pt required min cues but able to correctly locate the answer        Exercises Other Exercises Other Exercises: L foot on PT shoulder cues to activate into pressure with one time able to  activate with overflow from R leg   Shoulder Instructions       General Comments      Pertinent Vitals/ Pain       Pain Assessment: Faces Faces Pain Scale: Hurts even more Pain Location: headache Pain Descriptors / Indicators: Headache Pain Intervention(s): Monitored during session;Repositioned  Home Living   Living Arrangements: Alone(son and his family live home behind her) Available Help at Discharge: Family;Available 24 hours/day                     Bathroom Accessibility: Yes How Accessible: Accessible via walker        Lives With: Alone    Prior Functioning/Environment              Frequency  Min 3X/week        Progress Toward Goals  OT Goals(current goals can now be found in the care plan section)  Progress towards OT goals: Progressing toward goals  Acute Rehab OT Goals Patient Stated Goal: to sit in the sun - pt positioned in the sun in her chair  OT Goal Formulation: With patient/family Time For Goal Achievement: 07/28/18 Potential to Achieve Goals: Good ADL Goals Pt Will Perform Grooming: with set-up;sitting Pt Will Perform Upper Body Dressing: with min guard assist;with caregiver independent in assisting;sitting Pt Will Perform Lower Body Dressing: with max assist;bed level Pt Will Transfer to Toilet: with mod assist;with +2 assist;bedside commode;stand pivot transfer Pt Will Perform Toileting - Clothing Manipulation and hygiene: with min assist;sitting/lateral leans Pt/caregiver will Perform Home Exercise Program: Left upper extremity;Increased ROM;Increased strength;Independently;With written HEP provided Additional ADL Goal #1: Pt will perform bed mobility at mod A level prior to engaging in ADL activity Additional ADL Goal #2: Pt will demonstrate at least 1 compensatory strategy for left visual field deficit during ADL  Plan Discharge plan remains appropriate    Co-evaluation    PT/OT/SLP Co-Evaluation/Treatment: Yes Reason for  Co-Treatment: For patient/therapist safety;To address functional/ADL transfers PT goals addressed during session: Mobility/safety with mobility;Balance;Proper use of DME OT goals addressed during session: ADL's and self-care;Proper use of Adaptive equipment and DME;Strengthening/ROM      AM-PAC PT "6 Clicks" Daily Activity     Outcome Measure   Help from another person eating meals?: A Lot Help from another person taking care of personal grooming?: A Lot Help from another person toileting, which includes using toliet, bedpan, or urinal?: A Lot Help from another person bathing (including washing, rinsing, drying)?: A Lot Help from another person to put on and taking off regular upper body clothing?: A Lot Help from another person to put on and taking off regular lower body clothing?: A Lot 6 Click Score: 12    End of Session Equipment Utilized During Treatment: Gait belt  OT Visit Diagnosis: Unsteadiness on feet (R26.81);Other abnormalities of gait and mobility (R26.89);Muscle weakness (generalized) (M62.81);Hemiplegia and hemiparesis;Pain Hemiplegia - Right/Left: Left Hemiplegia - dominant/non-dominant: Non-Dominant Hemiplegia - caused by: Nontraumatic intracerebral hemorrhage   Activity Tolerance Patient tolerated treatment well   Patient  Left in chair;with call bell/phone within reach;with chair alarm set   Nurse Communication Mobility status;Need for lift equipment        Time: 2904-7533 OT Time Calculation (min): 19 min  Charges: OT General Charges $OT Visit: 1 Visit   Jeri Modena, OTR/L  Acute Rehabilitation Services Pager: [phone] Office: [phone] .    Parke Poisson B 07/16/2018, 3:55 PM

## 2018-07-16 NOTE — PMR Pre-admission (Signed)
PMR Admission Coordinator Pre-Admission Assessment  Patient: [name] is an 72 y.o., female MRN: [id number] DOB: [birth year] Height: 5\' 6"  (167.6 cm) Weight: 89.9 kg              Insurance Information HMO:     PPO:      PCP:      IPA:      80/20:      OTHER: no HMO PRIMARY: Medicare a and b      Policy#: [id number]      Subscriber: pt Benefits:  Phone #: online     Name: 07/16/2018 Eff. Date: 10/17/2012     Deduct: $1364      Out of Pocket Max: none      Life Max: none CIR: 100%      SNF: 20 full days Outpatient: 80%     Co-Pay: 20% Home Health: 100%      Co-Pay: none DME: 80%     Co-Pay: 20% Providers: pt choice  SECONDARY: [employer] indemnity      Policy#: [id number]      Subscriber: pt  Medicaid Application Date:       Case Manager:  Disability Application Date:       Case Worker:   Emergency Contact Information Contact Information    Name Relation Home Work [place] Daughter   [phone]   [name], [name]   [phone]     Current Medical History  Patient Admitting Diagnosis: right parietal intracranial hemorrhage   History of Present Illness: [name] is a 72 y.o. female with history of hypertension, PAF, obesity, who was admitted on 07/13/2018 with progressive headache, nausea/ vomiting, and left-sided weakness.  CT of head done revealing right parietal lobe hematoma with small local SAH and SDH with local mass-effect.  Dr. Annette Stable felt that  hemorrhage was due to hypertension and recommended monitoring with repeat CT. MRI/MRA brain done 8/28 and revealed stable hematoma right parietal lobe with stable mass-effect and MRA limited by motion artifact--no large vessel occlusion noted. ASA discontinued and Dr. Erlinda Hong recommends repeating MRI/MRA  brain once ICH resolved to rule out malignancy, aneurysm or AVM. Therapy evaluations complete with recommendation for CIR and patient admitted 07/19/18.      Complete NIHSS TOTAL: 9    Past Medical History   Past Medical History:  Diagnosis Date  . Dyslipidemia   . GERD (gastroesophageal reflux disease)   . High risk medication use    on Flecainide since January of 2013  . Hypertension   . Obesity   . PAF (paroxysmal atrial fibrillation) (HCC)    normal stress echo in 2011    Family History  family history includes Alzheimer's disease in her mother; Cancer in her brother; Heart disease in her father; Lung cancer in her brother; Stroke in her sister.  Prior Rehab/Hospitalizations:  Has the patient had major surgery during 100 days prior to admission? No  Current Medications   Current Facility-Administered Medications:  .  acetaminophen-codeine (TYLENOL #3) 300-30 MG per tablet 2 tablet, 2 tablet, Oral, Q6H PRN, Garvin Fila, MD, 2 tablet at 07/19/18 0410 .  benazepril (LOTENSIN) tablet 20 mg, 20 mg, Oral, BID, Rinehuls, David L, PA-C, 20 mg at 07/18/18 2140 .  flecainide (TAMBOCOR) tablet 100 mg, 100 mg, Oral, Daily, Aroor, Karena Addison R, MD, 100 mg at 07/18/18 1100 .  flecainide (TAMBOCOR) tablet 50 mg, 50 mg, Oral, QHS, Aroor, Lanice Schwab, MD, 50 mg at 07/18/18 2141 .  hydrALAZINE (APRESOLINE)  injection 10 mg, 10 mg, Intravenous, Q4H PRN, Rinehuls, David L, PA-C .  labetalol (NORMODYNE,TRANDATE) injection 5-10 mg, 5-10 mg, Intravenous, Q2H PRN, Rosalin Hawking, MD, 10 mg at 07/17/18 0930 .  MEDLINE mouth rinse, 15 mL, Mouth Rinse, BID, Aroor, Lanice Schwab, MD, 15 mL at 07/18/18 2147 .  metoprolol tartrate (LOPRESSOR) tablet 50 mg, 50 mg, Oral, BID, Garvin Fila, MD, 50 mg at 07/18/18 2140 .  ondansetron (ZOFRAN) injection 4 mg, 4 mg, Intravenous, Q8H PRN, Aroor, Lanice Schwab, MD, 4 mg at 07/15/18 0639 .  pantoprazole (PROTONIX) EC tablet 40 mg, 40 mg, Oral, Daily, Rosalin Hawking, MD, 40 mg at 07/18/18 1100 .  senna-docusate (Senokot-S) tablet 1 tablet, 1 tablet, Oral, BID, Aroor, Lanice Schwab, MD, 1 tablet at 07/18/18 2139 .  sertraline (ZOLOFT) tablet 50 mg, 50 mg, Oral, Daily, Rosalin Hawking, MD,  50 mg at 07/18/18 1100 .  zolpidem (AMBIEN) tablet 5 mg, 5 mg, Oral, QHS PRN, Garvin Fila, MD  Patients Current Diet:  Diet Order            Diet regular Room service appropriate? Yes; Fluid consistency: Thin  Diet effective now              Precautions / Restrictions Precautions Precautions: Fall Precaution Comments: L visual deficit, L sided weakness Restrictions Weight Bearing Restrictions: No   Has the patient had 2 or more falls or a fall with injury in the past year?No  Prior Activity Level Community (5-7x/wk): independent and caring for 81 year old grandchild  Development worker, international aid / Paramedic Devices/Equipment: None Home Equipment: Bedside commode, Environmental consultant - 4 wheels, Grab bars - tub/shower, Hand held shower head, Shower seat - built in  Prior Device Use: Indicate devices/aids used by the patient prior to current illness, exacerbation or injury? None of the above  Prior Functional Level Prior Function Level of Independence: Independent Comments: driving, takes care of 30 year old grandson  Self Care: Did the patient need help bathing, dressing, using the toilet or eating?  Independent  Indoor Mobility: Did the patient need assistance with walking from room to room (with or without device)? Independent  Stairs: Did the patient need assistance with internal or external stairs (with or without device)? Independent  Functional Cognition: Did the patient need help planning regular tasks such as shopping or remembering to take medications? Independent  Current Functional Level Cognition  Overall Cognitive Status: Impaired/Different from baseline Current Attention Level: Selective Orientation Level: Oriented X4 Following Commands: Follows one step commands consistently General Comments: pt attempting a search and find task on table. pt using a bright yellow piece of paper . pt required min cues but able to correctly locate the answer Awareness:  Impaired Awareness Impairment: Intellectual impairment Problem Solving: Impaired Problem Solving Impairment: Functional complex    Extremity Assessment (includes Sensation/Coordination)  Upper Extremity Assessment: LUE deficits/detail LUE Deficits / Details: flaccid, extensor tone prsent LUE Sensation: decreased light touch LUE Coordination: decreased fine motor, decreased gross motor  Lower Extremity Assessment: Defer to PT evaluation LLE Deficits / Details: AAROM/PROM WFL except ankle DF limited by about 20 degrees PROM; noted spasticity 2/4 on modified [place], strength hip flexion at least 2/5, knee extension not activated in supine, noted no active ankle movement LLE Sensation: decreased light touch    ADLs  Overall ADL's : Needs assistance/impaired Eating/Feeding: Set up Eating/Feeding Details (indicate cue type and reason): pt with chips placed on left side and required to scan to the  L to locate the chips. pt states "where did she put my chips" pt again oriented to placement pt able to recall and find chips the second attempt Grooming: Moderate assistance Upper Body Bathing: Maximal assistance Lower Body Bathing: Total assistance Upper Body Dressing : Maximal assistance Lower Body Dressing: Maximal assistance Toilet Transfer: +2 for safety/equipment, Maximal assistance, +2 for physical assistance Toilet Transfer Details (indicate cue type and reason): simulated EOB to chair  Functional mobility during ADLs: (Deferred this session) General ADL Comments: pt attempting OOB to chair with stand pivot this session. pt requires cues and tactile input at EOB to work on static sitting balance. pt able to complete transfer this session with mod cues    Mobility  Overal bed mobility: Needs Assistance Bed Mobility: Rolling, Sidelying to Sit Rolling: Min assist Sidelying to sit: Mod assist General bed mobility comments: pt able to roll toward L side with use of bed rail; assistance  required for L LE to EOB and elevating trunk into sitting    Transfers  Overall transfer level: Needs assistance Equipment used: None Transfer via Lift Equipment: [employer] Transfers: Sit to/from Stand, Risk manager Sit to Stand: +2 physical assistance, Max assist Stand pivot transfers: +2 physical assistance, Max assist General transfer comment: +2 assist to power up into standing and maintain balance in standing and to pivot toward R side; L LE blocked and faciliation at glutes and trunk for posture     Ambulation / Gait / Stairs / Wheelchair Mobility  Ambulation/Gait General Gait Details: unable at this time.     Posture / Balance Dynamic Sitting Balance Sitting balance - Comments: able to fix posture and alignment momentarily with min to mod verbal and tactile cues.  needs increased time to notice items on L side Balance Overall balance assessment: Needs assistance Sitting-balance support: Feet supported, Single extremity supported Sitting balance-Leahy Scale: Poor Sitting balance - Comments: able to fix posture and alignment momentarily with min to mod verbal and tactile cues.  needs increased time to notice items on L side Postural control: Left lateral lean, Other (comment)(anterior lean) Standing balance support: Bilateral upper extremity supported Standing balance-Leahy Scale: Zero Standing balance comment: +2 A in standing for pivot to chair    Special needs/care consideration BiPAP/CPAP  N/a CPM n/a Continuous Drip IV n/a Dialysis n/a Life Vest n/a Oxygen n/a Special Bed n/a Trach Size n/a Wound Vac n/a Skin intact Bowel mgmt: LBM 07/14/18 Bladder mgmt: #16 foley inserted 07/15/18 Diabetic mgmt Hgb A1c 5.6 Current smoker    Previous Home Environment Living Arrangements: Alone  Lives With: Alone Available Help at Discharge: Family, Available 24 hours/day Type of Home: House Home Layout: One level Home Access: Stairs to enter Entrance Stairs-Rails:  Right, Left, Can reach both Entrance Stairs-Number of Steps: 5 Bathroom Shower/Tub: Multimedia programmer: Handicapped height Bathroom Accessibility: Yes How Accessible: Accessible via walker Home Care Services: No  Discharge Living Setting Plans for Discharge Living Setting: Patient's home, Alone Type of Home at Discharge: House Discharge Home Layout: One level Discharge Home Access: Stairs to enter Entrance Stairs-Rails: Right, Left, Can reach both Entrance Stairs-Number of Steps: 5 Discharge Bathroom Shower/Tub: Walk-in shower Discharge Bathroom Toilet: Handicapped height Discharge Bathroom Accessibility: Yes How Accessible: Accessible via walker Does the patient have any problems obtaining your medications?: No  Social/Family/Support Systems Patient Roles: Parent, Caregiver Contact Information: [name], son is main contact Anticipated Caregiver: son, daughter, and other family Anticipated Caregiver's Contact Information: see above Ability/Limitations of Caregiver: son  and daughter work but have family  and others who will rotate to help Caregiver Availability: 24/7 Discharge Plan Discussed with Primary Caregiver: Yes Is Caregiver In Agreement with Plan?: Yes Does Caregiver/Family have Issues with Lodging/Transportation while Pt is in Rehab?: No  I spoke with son, [name] on 8/30. He and his sister work but will arrange for care 24/7 at home. [name], is retired, friends and family. Patient's spouse died last year and she cared for him so they state they will do the same.  Goals/Additional Needs Patient/Family Goal for Rehab: min assist with PT, OT and supervision SLP Expected length of stay: ELOS 21 to 25 days Pt/Family Agrees to Admission and willing to participate: Yes Program Orientation Provided & Reviewed with Pt/Caregiver Including Roles  & Responsibilities: Yes  Decrease burden of Care through IP rehab admission: n/a  Possible need for SNF placement  upon discharge: not anticipated  Patient Condition: This patient's medical and functional status has changed since the consult dated: 07/15/2018 in which the Rehabilitation Physician determined and documented that the patient's condition is appropriate for intensive rehabilitative care in an inpatient rehabilitation facility. See "History of Present Illness" (above) for medical update. Functional changes are: Max A +2 transfers. Patient's medical and functional status update has been discussed with the Rehabilitation physician and patient remains appropriate for inpatient rehabilitation. Will admit to inpatient rehab today.  Preadmission Screen Completed By: Danne Baxter, RN  with updates provided by Gunnar Fusi, 07/19/2018 10:52 AM ______________________________________________________________________   Discussed status with Dr. Posey Pronto on 07/19/18 at 1055 and received telephone approval for admission today.  Admission Coordinator:  Gunnar Fusi, time 1055/Date 07/19/18

## 2018-07-16 NOTE — Progress Notes (Signed)
Physical Therapy Treatment Patient Details Name: [name] MRN: [id number] DOB: 27-Aug-1946 Today's Date: 07/16/2018    History of Present Illness Pt is a 72 y.o. female PMH including dyslipidemia, GERD, hypertension, obesity, PAF.  Presenting with left sided weakness, headache, and nausea/vomiting. MRI/CT revealed high density hematoma centered in the right parietal lobe.    PT Comments    Patient progressing this session able to transfer OOB to chair.  She is able to extend trunk and hold head up temporarily in sitting, but degrades down to L and forward lean.  She can look to L with cues and encouragement, but remains with R gaze preference.  Continue to feel she is good candidate for CIR level rehab at d/c.    Follow Up Recommendations  CIR;Supervision/Assistance - 24 hour     Equipment Recommendations  Wheelchair (measurements PT);Wheelchair cushion (measurements PT);Hospital bed;3in1 (PT)    Recommendations for Other Services       Precautions / Restrictions Precautions Precautions: Fall Precaution Comments: L visual deficit, L sided weakness    Mobility  Bed Mobility Overal bed mobility: Needs Assistance Bed Mobility: Rolling;Sidelying to Sit Rolling: Mod assist Sidelying to sit: Mod assist       General bed mobility comments: cues to reach with R hand to L rail and assist to place hand on rail, then to get L leg off bed and to lift trunk  Transfers Overall transfer level: Needs assistance Equipment used: None Transfers: Sit to/from Stand;Stand Pivot Transfers Sit to Stand: +2 physical assistance;Max assist Stand pivot transfers: +2 physical assistance;Max assist       General transfer comment: maxisky placed in the chair and encourage RN staff to use the lift for transfers  Ambulation/Gait                 Stairs             Wheelchair Mobility    Modified Rankin (Stroke Patients Only) Modified Rankin (Stroke Patients  Only) Pre-Morbid Rankin Score: No symptoms Modified Rankin: Severe disability     Balance Overall balance assessment: Needs assistance Sitting-balance support: Feet supported;Single extremity supported Sitting balance-Leahy Scale: Poor Sitting balance - Comments: able to fix posture and alignment momentarily with min to mod verbal and tactile cues.  needs increased time to notice items on L side Postural control: Left lateral lean;Other (comment)(anterior lean)   Standing balance-Leahy Scale: Poor Standing balance comment: +2 A in standing for pivot to chair                            Cognition Arousal/Alertness: Awake/alert Behavior During Therapy: WFL for tasks assessed/performed Overall Cognitive Status: Impaired/Different from baseline Area of Impairment: Attention;Following commands;Awareness;Problem solving                   Current Attention Level: Selective   Following Commands: Follows one step commands consistently     Problem Solving: Difficulty sequencing;Requires verbal cues;Requires tactile cues General Comments: pt attempting a search and find task on table. pt using a bright yellow piece of paper . pt required min cues but able to correctly locate the answer      Exercises Other Exercises Other Exercises: L foot on PT shoulder cues to activate into pressure with one time able to activate with overflow from R leg    General Comments        Pertinent Vitals/Pain Pain Assessment: Faces Faces Pain Scale: Hurts even  more Pain Location: headache Pain Descriptors / Indicators: Headache Pain Intervention(s): Monitored during session;Repositioned    Home Living   Living Arrangements: Alone(son and his family live home behind her) Available Help at Discharge: Family;Available 24 hours/day                Prior Function            PT Goals (current goals can now be found in the care plan section) Acute Rehab PT Goals Patient Stated  Goal: to sit in the sun - pt positioned in the sun in her chair  Progress towards PT goals: Progressing toward goals    Frequency    Min 4X/week      PT Plan Current plan remains appropriate    Co-evaluation PT/OT/SLP Co-Evaluation/Treatment: Yes Reason for Co-Treatment: For patient/therapist safety;To address functional/ADL transfers PT goals addressed during session: Mobility/safety with mobility;Balance;Proper use of DME OT goals addressed during session: ADL's and self-care;Proper use of Adaptive equipment and DME;Strengthening/ROM      AM-PAC PT "6 Clicks" Daily Activity  Outcome Measure  Difficulty turning over in bed (including adjusting bedclothes, sheets and blankets)?: Unable Difficulty moving from lying on back to sitting on the side of the bed? : Unable Difficulty sitting down on and standing up from a chair with arms (e.g., wheelchair, bedside commode, etc,.)?: Unable Help needed moving to and from a bed to chair (including a wheelchair)?: A Lot Help needed walking in hospital room?: Total Help needed climbing 3-5 steps with a railing? : Total 6 Click Score: 7    End of Session Equipment Utilized During Treatment: Gait belt Activity Tolerance: Patient tolerated treatment well Patient left: with call bell/phone within reach;in chair;with chair alarm set Nurse Communication: Mobility status;Need for lift equipment PT Visit Diagnosis: Other abnormalities of gait and mobility (R26.89);Hemiplegia and hemiparesis Hemiplegia - Right/Left: Left Hemiplegia - dominant/non-dominant: Non-dominant Hemiplegia - caused by: Other Nontraumatic intracranial hemorrhage     Time: 0076-2263 PT Time Calculation (min) (ACUTE ONLY): 19 min  Charges:  $Therapeutic Activity: 8-22 mins                     [place], [place] [phone] 07/16/2018    [name] 07/16/2018, 3:55 PM

## 2018-07-16 NOTE — Progress Notes (Signed)
Transfer report received from 4N at 1700. Pt alert and verbally responsive; skin dry and intact with no pressure ulcer or opened wounds noted except for old bruising noted to right arm. Skin assessment completed with RN Shaun. Pt oriented to the unit and room; fall/safety precaution and prevention education completed. Bed alarm on; telemetry applied and verified with CCMD, second RN called to second verify. Pt resting comfortably in bed with call light within reach and family remains at bedside. Will closely monitor. Delia Heady RN    07/16/18 1730  Vitals  Temp 98 F (36.7 C)  Temp Source Oral  BP (!) 161/62  MAP (mmHg) 91  BP Location Right Arm  BP Method Automatic  Patient Position (if appropriate) Lying  Pulse Rate (!) 58  Pulse Rate Source Dinamap  Resp 16  Oxygen Therapy  SpO2 97 %  O2 Device Room Air

## 2018-07-16 NOTE — Progress Notes (Signed)
Inpatient Rehabilitation Admissions Coordinator  I met with patient at bedside and also with her permission spoke with her son, Coralyn Mark, by phone. We discussed goals and expectations of an inpt rehab admission. Family will arrange 24/7 physical care after rehab admission. They prefer inpt rehab rather than SNF. I await further progress with therapy over the weekend before proceeding with admit hopefully Monday.  Danne Baxter, RN, MSN Rehab Admissions Coordinator [phone] 07/16/2018 11:59 AM

## 2018-07-17 ENCOUNTER — Other Ambulatory Visit: Payer: Self-pay

## 2018-07-17 LAB — CBC
HCT: 40.8 % (ref 36.0–46.0)
Hemoglobin: 13.7 g/dL (ref 12.0–15.0)
MCH: 29.8 pg (ref 26.0–34.0)
MCHC: 33.6 g/dL (ref 30.0–36.0)
MCV: 88.7 fL (ref 78.0–100.0)
PLATELETS: 195 10*3/uL (ref 150–400)
RBC: 4.6 MIL/uL (ref 3.87–5.11)
RDW: 12.8 % (ref 11.5–15.5)
WBC: 8.6 10*3/uL (ref 4.0–10.5)

## 2018-07-17 LAB — BASIC METABOLIC PANEL
Anion gap: 10 (ref 5–15)
BUN: 15 mg/dL (ref 8–23)
CO2: 26 mmol/L (ref 22–32)
CREATININE: 0.83 mg/dL (ref 0.44–1.00)
Calcium: 9.5 mg/dL (ref 8.9–10.3)
Chloride: 102 mmol/L (ref 98–111)
GFR calc Af Amer: >60 mL/min (ref >60)
Glucose, Bld: 111 mg/dL — ABNORMAL HIGH (ref 70–99)
Potassium: 3.6 mmol/L (ref 3.5–5.1)
SODIUM: 138 mmol/L (ref 135–145)

## 2018-07-17 MED ORDER — HYDRALAZINE HCL 20 MG/ML IJ SOLN
10.0000 mg | Freq: Once | INTRAMUSCULAR | Status: AC
  Administered 2018-07-17: 10 mg via INTRAVENOUS
  Filled 2018-07-17: qty 1

## 2018-07-17 MED ORDER — ACETAMINOPHEN-CODEINE #3 300-30 MG PO TABS
2.0000 | ORAL_TABLET | Freq: Four times a day (QID) | ORAL | Status: DC | PRN
  Administered 2018-07-17 – 2018-07-19 (×6): 2 via ORAL
  Filled 2018-07-17 (×8): qty 2

## 2018-07-17 MED ORDER — METOPROLOL TARTRATE 50 MG PO TABS
50.0000 mg | ORAL_TABLET | Freq: Two times a day (BID) | ORAL | Status: DC
  Administered 2018-07-17 – 2018-07-19 (×4): 50 mg via ORAL
  Filled 2018-07-17 (×4): qty 1

## 2018-07-17 MED ORDER — ZOLPIDEM TARTRATE 5 MG PO TABS: 5.0000 mg | ORAL_TABLET | Freq: Every evening | ORAL | Status: DC | PRN

## 2018-07-17 MED ORDER — BENAZEPRIL HCL 20 MG PO TABS
20.0000 mg | ORAL_TABLET | Freq: Two times a day (BID) | ORAL | Status: DC
  Administered 2018-07-17 – 2018-07-19 (×5): 20 mg via ORAL
  Filled 2018-07-17 (×5): qty 1

## 2018-07-17 MED ORDER — HYDRALAZINE HCL 20 MG/ML IJ SOLN
10.0000 mg | INTRAMUSCULAR | Status: DC | PRN
  Filled 2018-07-17: qty 1

## 2018-07-17 MED ORDER — BENAZEPRIL HCL 20 MG PO TABS: 10.0000 mg | ORAL_TABLET | Freq: Two times a day (BID) | ORAL | Status: DC

## 2018-07-17 NOTE — Progress Notes (Signed)
Cross coverage note-6:50 AM on 07/17/2018  Notified by patient's nurse that her systolic blood pressures in the 180s.  The PRN labetalol was given x1 with no improvement in blood pressures. Heart rate running in the 50s.  RECS: -Ordered hydralazine 10 mg IV x1. -Advised checking blood pressure again in 30 minutes. -If not back to goal, might need transfer to the ICU for Cleviprex drip.  Stroke team to continue to follow the patient.  -- Amie Portland, MD [facility] Pager: [phone] If 7pm to 7am, please call on call as listed on AMION.

## 2018-07-17 NOTE — Progress Notes (Signed)
Pt BP remains elevated; prn labetalol adm. PA Rhinehauls notified and said they will address when they come to see pt but to notify them if BP goes more than 190. Will continue to closely monitor. Francis Gaines Ankit Degregorio RN    07/17/18 0915  Vitals  BP (!) 182/70  MAP (mmHg) 103  BP Location Right Arm  BP Method Automatic  Patient Position (if appropriate) Lying  Pulse Rate 66  Resp 18  Oxygen Therapy  SpO2 98 %  O2 Device Room Air  Pain Assessment  Pain Scale 0-10  Pain Score Asleep

## 2018-07-17 NOTE — Progress Notes (Signed)
STROKE TEAM PROGRESS NOTE   SUBJECTIVE (INTERVAL HISTORY) Her  Friend and daughter are at the bedside.  Overall she feels her condition is stable that she had trouble sleeping last night. She complains of headache and restlessness. Her blood pressure also been quite high requiring when necessary hydralazine and labetalol..    OBJECTIVE Temp:  [97.4 F (36.3 C)-98.9 F (37.2 C)] 98.3 F (36.8 C) (08/31 0811) Pulse Rate:  [56-72] 69 (08/31 1300) Cardiac Rhythm: Normal sinus rhythm (08/30 2022) Resp:  [15-18] 18 (08/31 1300) BP: (138-203)/(49-100) 155/52 (08/31 1300) SpO2:  [95 %-99 %] 99 % (08/31 1300)  No results for input(s): GLUCAP in the last 168 hours. Recent Labs  Lab 07/13/18 0940 07/13/18 0959 07/14/18 0350 07/15/18 0305 07/16/18 0316 07/17/18 0633  NA 140 140 140 138 136 138  K 3.4* 3.3* 3.1* 3.8 3.6 3.6  CL 101 100 104 105 101 102  CO2 27  --  27 25 26 26   GLUCOSE 131* 131* 132* 162* 96 111*  BUN 17 19 19 16 16 15   CREATININE 1.05* 0.90 0.93 0.85 0.83 0.83  CALCIUM 10.0  --  9.5 9.3 9.6 9.5  MG  --   --   --  1.9  --   --    Recent Labs  Lab 07/13/18 0940  AST 18  ALT 19  ALKPHOS 82  BILITOT 1.0  PROT 6.6  ALBUMIN 4.1   Recent Labs  Lab 07/13/18 0940  07/13/18 1141 07/14/18 0350 07/15/18 0305 07/16/18 0316 07/17/18 0633  WBC 19.1*  --  22.5* 17.0* 15.6* 10.6* 8.6  NEUTROABS 16.5*  --  19.9*  --   --   --   --   HGB 15.2*   < > 16.2* 13.5 13.3 14.6 13.7  HCT 44.8   < > 48.2* 40.3 40.1 42.7 40.8  MCV 88.9  --  88.3 88.6 90.3 87.0 88.7  PLT 220  --  249 198 195 194 195   < > = values in this interval not displayed.   No results for input(s): CKTOTAL, CKMB, CKMBINDEX, TROPONINI in the last 168 hours. No results for input(s): LABPROT, INR in the last 72 hours. No results for input(s): COLORURINE, LABSPEC, [place], GLUCOSEU, HGBUR, BILIRUBINUR, KETONESUR, PROTEINUR, UROBILINOGEN, NITRITE, LEUKOCYTESUR in the last 72 hours.  Invalid input(s):  APPERANCEUR     Component Value Date/Time   CHOL 97 07/14/2018 0353   TRIG 110 07/14/2018 0353   HDL 26 (L) 07/14/2018 0353   CHOLHDL 3.7 07/14/2018 0353   VLDL 22 07/14/2018 0353   LDLCALC 49 07/14/2018 0353   Lab Results  Component Value Date   HGBA1C 5.6 07/14/2018   No results found for: LABOPIA, COCAINSCRNUR, LABBENZ, AMPHETMU, THCU, LABBARB  No results for input(s): ETH in the last 168 hours.  I have personally reviewed the radiological images below and agree with the radiology interpretations.  Ct Head Wo Contrast  Result Date: 07/16/2018 CLINICAL DATA:  72 y/o  F; follow-up of intracranial hemorrhage. EXAM: CT HEAD WITHOUT CONTRAST TECHNIQUE: Contiguous axial images were obtained from the base of the skull through the vertex without intravenous contrast. COMPARISON:  07/13/2018 CT head.  07/14/2018 MRI head. FINDINGS: Brain: Right parietal hematoma measures 4.9 x 4.7 x 5.2 cm (volume = 63 cm^3) measured in a similar fashion to the prior CT of head, previously 57 cc. Edema surrounding the hematoma is mildly increased as is local mass effect effacing the posterior right lateral ventricle resulting in 4 mm  of right-to-left midline shift. Interval partial dispersion of subarachnoid hemorrhage over the right cerebral convexity. No new focus of intracranial hemorrhage or focal mass effect identified. Vascular: No hyperdense vessel or unexpected calcification. Skull: Normal. Negative for fracture or focal lesion. Sinuses/Orbits: No acute finding. Other: None. IMPRESSION: 1. Right parietal hematoma is stable to minimally increased in size in comparison with the prior CT of the head given differences in slice selection measuring 63 cc. 2. Interval partial dispersion of subarachnoid hemorrhage over the right cerebral convexity. 3. Mild interval increase in edema surrounding the hematoma as well as associated mass effect with 4 mm right-to-left midline shift. Electronically Signed   By: Kristine Garbe M.D.   On: 07/16/2018 04:40   Ct Head Wo Contrast  Result Date: 07/13/2018 CLINICAL DATA:  Worsening headaches. History of intracranial hemorrhage, hypertension, atrial fibrillation. EXAM: CT HEAD WITHOUT CONTRAST TECHNIQUE: Contiguous axial images were obtained from the base of the skull through the vertex without intravenous contrast. COMPARISON:  CT HEAD July 13, 2018 FINDINGS: BRAIN: 4.4 x 4.7 x 5.3 cm (volume = 57 cc) RIGHT parietal hematoma with similar surrounding low-density vasogenic edema. Extra-axial extension, small volume RIGHT subarachnoid hemorrhage and 5 mm RIGHT subdural hematoma. Mass effect on RIGHT cerebrum without midline shift. No parenchymal brain volume loss for age. No acute large vascular territory infarcts. Basal cisterns are patent. VASCULAR: Minimal calcific atherosclerosis of the carotid siphons. SKULL: No skull fracture. No significant scalp soft tissue swelling. SINUSES/ORBITS: Trace paranasal sinus mucosal thickening. Mastoid air cells are well aerated.The included ocular globes and orbital contents are non-suspicious. OTHER: None. IMPRESSION: 1. 57 cc acute RIGHT parietal hematoma was 50 cc. Similar regional mass effect without midline shift. 2. Similar small volume RIGHT subarachnoid hemorrhage and 5 mm RIGHT subdural hematoma. Electronically Signed   By: Elon Alas M.D.   On: 07/13/2018 18:12   Ct Head Wo Contrast  Result Date: 07/13/2018 CLINICAL DATA:  Focal neuro deficit. Right parietal headache for 2-3 days. EXAM: CT HEAD WITHOUT CONTRAST TECHNIQUE: Contiguous axial images were obtained from the base of the skull through the vertex without intravenous contrast. COMPARISON:  None. FINDINGS: Brain: 4.7 x 4.2 x 5.3 cm high-density hematoma centered in the right parietal lobe with a thin rim of edema. Shape is irregular, best estimate at volume is 50 cc. No gross underlying lesion. No low-density separate from the hematoma to suggest primary  infarct. The sagittal sinuses have normal density. No trauma history. No indication of chronic small vessel disease. There is small volume regional subarachnoid hemorrhage. Small volume of subdural blood is seen along the right frontal convexity measuring up to 4 mm thickness. There is local mass effect without hydrocephalus or entrapment. Vascular: No hyperdense vessel or unexpected calcification. Skull: Negative for fracture Sinuses/Orbits: Negative Other: Critical Value/emergent results were called by telephone at the time of interpretation on 07/13/2018 at 9:43 am to Dr. Marda Stalker , who verbally acknowledged these results. IMPRESSION: 50 cc acute hematoma centered in the right parietal lobe. There is small volume local subarachnoid and subdural extension. Local mass effect without hydrocephalus or midline shift. No detectable underlying cause. Electronically Signed   By: Monte Fantasia M.D.   On: 07/13/2018 09:46   Mr Jodene Nam Head Wo Contrast  Result Date: 07/14/2018 CLINICAL DATA:  72 y/o  F; cerebral hemorrhage for follow-up. EXAM: MRI HEAD WITHOUT CONTRAST MRA HEAD WITHOUT CONTRAST TECHNIQUE: Multiplanar, multiecho pulse sequences of the brain and surrounding structures were obtained without intravenous contrast. Angiographic  images of the head were obtained using MRA technique without contrast. COMPARISON:  07/13/2018 CT head. FINDINGS: MRI HEAD FINDINGS Brain: Motion degradation of multiple sequences. Right parietal hematoma is stable in size given differences in technique measuring approximately 4.4 x 4.9 x 5.4 cm (AP x ML x CC series 22, image 17 and series 30, image 8). Surrounding edema is stable as is local mass effect on the brain partially effacing the atria and temporal horn of right lateral ventricle and resulting in 3 mm right-to-left midline shift. Additionally the small volume of extra-axial hemorrhage over the right cerebral convexity is stable given differences in technique. There is  heterogeneous T1 hyperintensity of blood products. After administration of intravenous contrast there is no appreciable abnormal enhancement. No additional focus of hemorrhage, stroke, or focal mass effect in the brain. Vascular: Normal flow voids. Skull and upper cervical spine: Normal marrow signal. Sinuses/Orbits: Mild paranasal sinus mucosal thickening. Partial left mastoid opacification. Orbits are unremarkable. Other: None. MRA HEAD FINDINGS Severely motion degraded study. No proximal large vessel occlusion or large aneurysm is identified. Suboptimal assessment for small aneurysm or stenosis due to motion artifact. IMPRESSION: 1. Extensive motion artifact. 2. Stable hematoma in the right parietal lobe given differences in technique. Stable associated edema and mass effect with partial effacement of right lateral ventricle and 3 mm right-to-left midline shift. 3. No abnormal enhancement or additional abnormality of the brain identified. 4. MRA degraded by motion artifact. No proximal large vessel occlusion. 5. Consider repeat MRI/MRA when patient is able to hold still. Electronically Signed   By: Kristine Garbe M.D.   On: 07/14/2018 05:08   Mr Jeri Cos WN Contrast  Result Date: 07/14/2018 CLINICAL DATA:  72 y/o  F; cerebral hemorrhage for follow-up. EXAM: MRI HEAD WITHOUT CONTRAST MRA HEAD WITHOUT CONTRAST TECHNIQUE: Multiplanar, multiecho pulse sequences of the brain and surrounding structures were obtained without intravenous contrast. Angiographic images of the head were obtained using MRA technique without contrast. COMPARISON:  07/13/2018 CT head. FINDINGS: MRI HEAD FINDINGS Brain: Motion degradation of multiple sequences. Right parietal hematoma is stable in size given differences in technique measuring approximately 4.4 x 4.9 x 5.4 cm (AP x ML x CC series 22, image 17 and series 30, image 8). Surrounding edema is stable as is local mass effect on the brain partially effacing the atria and  temporal horn of right lateral ventricle and resulting in 3 mm right-to-left midline shift. Additionally the small volume of extra-axial hemorrhage over the right cerebral convexity is stable given differences in technique. There is heterogeneous T1 hyperintensity of blood products. After administration of intravenous contrast there is no appreciable abnormal enhancement. No additional focus of hemorrhage, stroke, or focal mass effect in the brain. Vascular: Normal flow voids. Skull and upper cervical spine: Normal marrow signal. Sinuses/Orbits: Mild paranasal sinus mucosal thickening. Partial left mastoid opacification. Orbits are unremarkable. Other: None. MRA HEAD FINDINGS Severely motion degraded study. No proximal large vessel occlusion or large aneurysm is identified. Suboptimal assessment for small aneurysm or stenosis due to motion artifact. IMPRESSION: 1. Extensive motion artifact. 2. Stable hematoma in the right parietal lobe given differences in technique. Stable associated edema and mass effect with partial effacement of right lateral ventricle and 3 mm right-to-left midline shift. 3. No abnormal enhancement or additional abnormality of the brain identified. 4. MRA degraded by motion artifact. No proximal large vessel occlusion. 5. Consider repeat MRI/MRA when patient is able to hold still. Electronically Signed   By: Kristine Garbe  M.D.   On: 07/14/2018 05:08   Dg Chest Port 1 View  Result Date: 07/13/2018 CLINICAL DATA:  Leukocytosis EXAM: PORTABLE CHEST 1 VIEW COMPARISON:  09/07/2014 FINDINGS: Cardiac shadow is within normal limits. The lungs are well aerated bilaterally. No focal infiltrate or sizable effusion is seen. Chronic changes in the left base are again noted. No bony abnormality is noted. IMPRESSION: No active disease. Electronically Signed   By: Inez Catalina M.D.   On: 07/13/2018 21:46   TTE - Left ventricle: The cavity size was normal. There was moderate   concentric  hypertrophy. Systolic function was vigorous. The   estimated ejection fraction was in the range of 65% to 70%. Wall   motion was normal; there were no regional wall motion   abnormalities. - Aortic valve: There was no regurgitation. - Mitral valve: There was mild regurgitation. - Left atrium: The atrium was moderately dilated. - Right ventricle: The cavity size was normal. Wall thickness was   normal. Systolic function was normal. - Right atrium: The atrium was moderately dilated. - Tricuspid valve: There was moderate regurgitation. - Pulmonary arteries: Systolic pressure was moderately increased.   PA peak pressure: 47 mm Hg (S). - Inferior vena cava: The vessel was normal in size. Impressions: - No cardiac source of emboli was indentified.   PHYSICAL EXAM  Temp:  [97.4 F (36.3 C)-98.9 F (37.2 C)] 98.3 F (36.8 C) (08/31 0811) Pulse Rate:  [56-72] 69 (08/31 1300) Resp:  [15-18] 18 (08/31 1300) BP: (138-203)/(49-100) 155/52 (08/31 1300) SpO2:  [95 %-99 %] 99 % (08/31 1300)  General - obese elderly Caucasian lady not in distress  Ophthalmologic - fundi not visualized due to noncooperation.  Cardiovascular - Regular rate and rhythm with no murmur.  Mental Status -  Level of arousal and orientation to time, place, and person were intact. Language including expression, naming, repetition, comprehension was assessed and found intact. Fund of Knowledge was assessed and was intact.  Cranial Nerves II - XII - II - dense left homonymous hemianopia. III, IV, VI - Extraocular movements intact. V - Facial sensation intact bilaterally. VII - left nasolabial fold flattening. VIII - Hearing & vestibular intact bilaterally. X - Palate elevates symmetrically, no dysarthria. XI - Chin turning & shoulder shrug intact bilaterally. XII - Tongue protrusion intact.  Motor Strength - The patient's strength was normal in RUE and RLE. However, left UE 0/5 and LLE 2+/5 iliopsoas, 2/5 knee  flexion, 0/5 distally.  Bulk was normal and fasciculations were absent.   Motor Tone - Muscle tone was assessed at the neck and appendages and was normal.  Reflexes - The patient's reflexes were symmetrical in all extremities and she had left babinski.  Sensory - Light touch, temperature/pinprick were assessed and were decreased on the left, about 50% of right LE and 75% of right UE.  Coordination - The patient had normal movements in the right hands with no ataxia or dysmetria.  Tremor was absent.  Gait and Station - deferred.   ASSESSMENT/PLAN [name] is a 72 y.o. female with history of PAF, HLD, HTN, obesity admitted for HA, vomiting after colonoscopy. CT found to have right occipitoparietal large ICH. No tPA given due to [place].    ICH:  right occipitoparietal ICH, likely related to spiking hypertension 2/2 vomiting with colonoscopy.   Resultant left hemianopia and left hemiplegia  CT head right occipitoparietal ICH 50cc  CT head repeat x 2 with stable hematoma  MRI  Right  occipitoparietal ICH, stable with focal mass effects  MRA  No LVO but motion degraded  repeat MRI brain with and without contrast as well as MRA head once ICH resolves to rule out malignancy or AVM or aneurysm  2D Echo EF 65-70%  LDL 49  HgbA1c 5.6  SCDs for VTE prophylaxis  aspirin 81 mg daily prior to admission, now on No antithrombotic.   Ongoing aggressive stroke risk factor management  Therapy recommendations:  CIR  Disposition:  Pending   Cerebral edema  15mm midline shift on MRI  Repeat CT 07/16/18 stable hematoma with midline shift at 68mm  No need of 3% at this time  NSG on board  Close monitoring neuro check  Na 140->138->136  PAF  On flecainide   On metoprolol  Currently NSR  On ASA PTA, now on no antithrombotic due to [place]  Hypertensive emergency Stable Off cardene on home metoprolol 25mg  bid  BP goal < 160  Tobacco abuse  Current  smoker  Smoking cessation counseling provided  Pt is willing to quit  Other Stroke Risk Factors  Advanced age  Obesity, Body mass index is 31.99 kg/m.   Other Active Problems  Colon polyps  Leukocytosis - 19.1->22.5->17.0->15.6->10.6  Hypokalemia - supplement 3.1->3.8->3.6 UA WBC 21-50 - asymptomatic   Hospital day # 4  I have personally examined this patient, reviewed notes, independently viewed imaging studies, participated in medical decision making and plan of care.ROS completed by me personally and pertinent positives fully documented  I have made any additions or clarifications directly to the above note. I had a long discussion with the patient and her daughter regarding intracerebral hemorrhage and answered questions about her care. Recommend change Tylenol to Tylenol No. 3 for headache and had Ambien when necessary at night to help sleep. Increase dose of metoprolol to 50 mg twice daily for better blood pressure control. Greater than 50% time during this 35 minute visit was spent on counseling and coordination of care about her intracerebral hemorrhage, headache and blood pressure management discussion and answering questions  Antony Contras, [place] Pager: [phone] 07/17/2018 1:26 PM   Stroke Neurology 07/17/2018 1:24 PM     To contact Stroke Continuity provider, please refer to [email or web address] After hours, contact General Neurology

## 2018-07-18 LAB — CBC
HEMATOCRIT: 41.4 % (ref 36.0–46.0)
Hemoglobin: 13.8 g/dL (ref 12.0–15.0)
MCH: 29.7 pg (ref 26.0–34.0)
MCHC: 33.3 g/dL (ref 30.0–36.0)
MCV: 89 fL (ref 78.0–100.0)
Platelets: 219 10*3/uL (ref 150–400)
RBC: 4.65 MIL/uL (ref 3.87–5.11)
RDW: 13 % (ref 11.5–15.5)
WBC: 9.2 10*3/uL (ref 4.0–10.5)

## 2018-07-18 LAB — BASIC METABOLIC PANEL
ANION GAP: 6 (ref 5–15)
BUN: 17 mg/dL (ref 8–23)
CO2: 28 mmol/L (ref 22–32)
Calcium: 9.5 mg/dL (ref 8.9–10.3)
Chloride: 105 mmol/L (ref 98–111)
Creatinine, Ser: 0.79 mg/dL (ref 0.44–1.00)
GFR calc Af Amer: >60 mL/min (ref >60)
GLUCOSE: 110 mg/dL — AB (ref 70–99)
POTASSIUM: 3.7 mmol/L (ref 3.5–5.1)
Sodium: 139 mmol/L (ref 135–145)

## 2018-07-18 MED ORDER — DIPHENHYDRAMINE HCL 50 MG/ML IJ SOLN
12.5000 mg | Freq: Once | INTRAMUSCULAR | Status: AC
  Administered 2018-07-18: 12.5 mg via INTRAVENOUS
  Filled 2018-07-18: qty 1

## 2018-07-18 NOTE — Progress Notes (Signed)
STROKE TEAM PROGRESS NOTE   SUBJECTIVE (INTERVAL HISTORY) [name] son is at the bedside.  Overall she feels [name] condition is stable No complaints today. She has been seen by rehabilitation team and felt to be a good candidate for inpatient rehabilitation. [name] blood pressure also been adequately controlled now OBJECTIVE Temp:  [97.4 F (36.3 C)-98.3 F (36.8 C)] 98.3 F (36.8 C) (09/01 1104) Pulse Rate:  [57-69] 60 (09/01 1104) Cardiac Rhythm: Normal sinus rhythm (09/01 0701) Resp:  [18-20] 20 (09/01 1104) BP: (149-159)/(52-64) 149/62 (09/01 1104) SpO2:  [94 %-99 %] 96 % (09/01 1104)  No results for input(s): GLUCAP in the last 168 hours. Recent Labs  Lab 07/14/18 0350 07/15/18 0305 07/16/18 0316 07/17/18 0633 07/18/18 0552  NA 140 138 136 138 139  K 3.1* 3.8 3.6 3.6 3.7  CL 104 105 101 102 105  CO2 27 25 26 26 28   GLUCOSE 132* 162* 96 111* 110*  BUN 19 16 16 15 17   CREATININE 0.93 0.85 0.83 0.83 0.79  CALCIUM 9.5 9.3 9.6 9.5 9.5  MG  --  1.9  --   --   --    Recent Labs  Lab 07/13/18 0940  AST 18  ALT 19  ALKPHOS 82  BILITOT 1.0  PROT 6.6  ALBUMIN 4.1   Recent Labs  Lab 07/13/18 0940  07/13/18 1141 07/14/18 0350 07/15/18 0305 07/16/18 0316 07/17/18 0633 07/18/18 0552  WBC 19.1*  --  22.5* 17.0* 15.6* 10.6* 8.6 9.2  NEUTROABS 16.5*  --  19.9*  --   --   --   --   --   HGB 15.2*   < > 16.2* 13.5 13.3 14.6 13.7 13.8  HCT 44.8   < > 48.2* 40.3 40.1 42.7 40.8 41.4  MCV 88.9  --  88.3 88.6 90.3 87.0 88.7 89.0  PLT 220  --  249 198 195 194 195 219   < > = values in this interval not displayed.   No results for input(s): CKTOTAL, CKMB, CKMBINDEX, TROPONINI in the last 168 hours. No results for input(s): LABPROT, INR in the last 72 hours. No results for input(s): COLORURINE, LABSPEC, [place], GLUCOSEU, HGBUR, BILIRUBINUR, KETONESUR, PROTEINUR, UROBILINOGEN, NITRITE, LEUKOCYTESUR in the last 72 hours.  Invalid input(s): APPERANCEUR     Component Value Date/Time    CHOL 97 07/14/2018 0353   TRIG 110 07/14/2018 0353   HDL 26 (L) 07/14/2018 0353   CHOLHDL 3.7 07/14/2018 0353   VLDL 22 07/14/2018 0353   LDLCALC 49 07/14/2018 0353   Lab Results  Component Value Date   HGBA1C 5.6 07/14/2018   No results found for: LABOPIA, COCAINSCRNUR, LABBENZ, AMPHETMU, THCU, LABBARB  No results for input(s): ETH in the last 168 hours.  I have personally reviewed the radiological images below and agree with the radiology interpretations.  Ct Head Wo Contrast  Result Date: 07/16/2018 CLINICAL DATA:  72 y/o  F; follow-up of intracranial hemorrhage. EXAM: CT HEAD WITHOUT CONTRAST TECHNIQUE: Contiguous axial images were obtained from the base of the skull through the vertex without intravenous contrast. COMPARISON:  07/13/2018 CT head.  07/14/2018 MRI head. FINDINGS: Brain: Right parietal hematoma measures 4.9 x 4.7 x 5.2 cm (volume = 63 cm^3) measured in a similar fashion to the prior CT of head, previously 57 cc. Edema surrounding the hematoma is mildly increased as is local mass effect effacing the posterior right lateral ventricle resulting in 4 mm of right-to-left midline shift. Interval partial dispersion of subarachnoid hemorrhage over  the right cerebral convexity. No new focus of intracranial hemorrhage or focal mass effect identified. Vascular: No hyperdense vessel or unexpected calcification. Skull: Normal. Negative for fracture or focal lesion. Sinuses/Orbits: No acute finding. Other: None. IMPRESSION: 1. Right parietal hematoma is stable to minimally increased in size in comparison with the prior CT of the head given differences in slice selection measuring 63 cc. 2. Interval partial dispersion of subarachnoid hemorrhage over the right cerebral convexity. 3. Mild interval increase in edema surrounding the hematoma as well as associated mass effect with 4 mm right-to-left midline shift. Electronically Signed   By: Kristine Garbe M.D.   On: 07/16/2018 04:40    Ct Head Wo Contrast  Result Date: 07/13/2018 CLINICAL DATA:  Worsening headaches. History of intracranial hemorrhage, hypertension, atrial fibrillation. EXAM: CT HEAD WITHOUT CONTRAST TECHNIQUE: Contiguous axial images were obtained from the base of the skull through the vertex without intravenous contrast. COMPARISON:  CT HEAD July 13, 2018 FINDINGS: BRAIN: 4.4 x 4.7 x 5.3 cm (volume = 57 cc) RIGHT parietal hematoma with similar surrounding low-density vasogenic edema. Extra-axial extension, small volume RIGHT subarachnoid hemorrhage and 5 mm RIGHT subdural hematoma. Mass effect on RIGHT cerebrum without midline shift. No parenchymal brain volume loss for age. No acute large vascular territory infarcts. Basal cisterns are patent. VASCULAR: Minimal calcific atherosclerosis of the carotid siphons. SKULL: No skull fracture. No significant scalp soft tissue swelling. SINUSES/ORBITS: Trace paranasal sinus mucosal thickening. Mastoid air cells are well aerated.The included ocular globes and orbital contents are non-suspicious. OTHER: None. IMPRESSION: 1. 57 cc acute RIGHT parietal hematoma was 50 cc. Similar regional mass effect without midline shift. 2. Similar small volume RIGHT subarachnoid hemorrhage and 5 mm RIGHT subdural hematoma. Electronically Signed   By: Elon Alas M.D.   On: 07/13/2018 18:12   Ct Head Wo Contrast  Result Date: 07/13/2018 CLINICAL DATA:  Focal neuro deficit. Right parietal headache for 2-3 days. EXAM: CT HEAD WITHOUT CONTRAST TECHNIQUE: Contiguous axial images were obtained from the base of the skull through the vertex without intravenous contrast. COMPARISON:  None. FINDINGS: Brain: 4.7 x 4.2 x 5.3 cm high-density hematoma centered in the right parietal lobe with a thin rim of edema. Shape is irregular, best estimate at volume is 50 cc. No gross underlying lesion. No low-density separate from the hematoma to suggest primary infarct. The sagittal sinuses have normal density.  No trauma history. No indication of chronic small vessel disease. There is small volume regional subarachnoid hemorrhage. Small volume of subdural blood is seen along the right frontal convexity measuring up to 4 mm thickness. There is local mass effect without hydrocephalus or entrapment. Vascular: No hyperdense vessel or unexpected calcification. Skull: Negative for fracture Sinuses/Orbits: Negative Other: Critical Value/emergent results were called by telephone at the time of interpretation on 07/13/2018 at 9:43 am to Dr. Marda Stalker , who verbally acknowledged these results. IMPRESSION: 50 cc acute hematoma centered in the right parietal lobe. There is small volume local subarachnoid and subdural extension. Local mass effect without hydrocephalus or midline shift. No detectable underlying cause. Electronically Signed   By: Monte Fantasia M.D.   On: 07/13/2018 09:46   Mr Jodene Nam Head Wo Contrast  Result Date: 07/14/2018 CLINICAL DATA:  72 y/o  F; cerebral hemorrhage for follow-up. EXAM: MRI HEAD WITHOUT CONTRAST MRA HEAD WITHOUT CONTRAST TECHNIQUE: Multiplanar, multiecho pulse sequences of the brain and surrounding structures were obtained without intravenous contrast. Angiographic images of the head were obtained using MRA technique without contrast.  COMPARISON:  07/13/2018 CT head. FINDINGS: MRI HEAD FINDINGS Brain: Motion degradation of multiple sequences. Right parietal hematoma is stable in size given differences in technique measuring approximately 4.4 x 4.9 x 5.4 cm (AP x ML x CC series 22, image 17 and series 30, image 8). Surrounding edema is stable as is local mass effect on the brain partially effacing the atria and temporal horn of right lateral ventricle and resulting in 3 mm right-to-left midline shift. Additionally the small volume of extra-axial hemorrhage over the right cerebral convexity is stable given differences in technique. There is heterogeneous T1 hyperintensity of blood products.  After administration of intravenous contrast there is no appreciable abnormal enhancement. No additional focus of hemorrhage, stroke, or focal mass effect in the brain. Vascular: Normal flow voids. Skull and upper cervical spine: Normal marrow signal. Sinuses/Orbits: Mild paranasal sinus mucosal thickening. Partial left mastoid opacification. Orbits are unremarkable. Other: None. MRA HEAD FINDINGS Severely motion degraded study. No proximal large vessel occlusion or large aneurysm is identified. Suboptimal assessment for small aneurysm or stenosis due to motion artifact. IMPRESSION: 1. Extensive motion artifact. 2. Stable hematoma in the right parietal lobe given differences in technique. Stable associated edema and mass effect with partial effacement of right lateral ventricle and 3 mm right-to-left midline shift. 3. No abnormal enhancement or additional abnormality of the brain identified. 4. MRA degraded by motion artifact. No proximal large vessel occlusion. 5. Consider repeat MRI/MRA when patient is able to hold still. Electronically Signed   By: Kristine Garbe M.D.   On: 07/14/2018 05:08   Mr Jeri Cos NL Contrast  Result Date: 07/14/2018 CLINICAL DATA:  72 y/o  F; cerebral hemorrhage for follow-up. EXAM: MRI HEAD WITHOUT CONTRAST MRA HEAD WITHOUT CONTRAST TECHNIQUE: Multiplanar, multiecho pulse sequences of the brain and surrounding structures were obtained without intravenous contrast. Angiographic images of the head were obtained using MRA technique without contrast. COMPARISON:  07/13/2018 CT head. FINDINGS: MRI HEAD FINDINGS Brain: Motion degradation of multiple sequences. Right parietal hematoma is stable in size given differences in technique measuring approximately 4.4 x 4.9 x 5.4 cm (AP x ML x CC series 22, image 17 and series 30, image 8). Surrounding edema is stable as is local mass effect on the brain partially effacing the atria and temporal horn of right lateral ventricle and  resulting in 3 mm right-to-left midline shift. Additionally the small volume of extra-axial hemorrhage over the right cerebral convexity is stable given differences in technique. There is heterogeneous T1 hyperintensity of blood products. After administration of intravenous contrast there is no appreciable abnormal enhancement. No additional focus of hemorrhage, stroke, or focal mass effect in the brain. Vascular: Normal flow voids. Skull and upper cervical spine: Normal marrow signal. Sinuses/Orbits: Mild paranasal sinus mucosal thickening. Partial left mastoid opacification. Orbits are unremarkable. Other: None. MRA HEAD FINDINGS Severely motion degraded study. No proximal large vessel occlusion or large aneurysm is identified. Suboptimal assessment for small aneurysm or stenosis due to motion artifact. IMPRESSION: 1. Extensive motion artifact. 2. Stable hematoma in the right parietal lobe given differences in technique. Stable associated edema and mass effect with partial effacement of right lateral ventricle and 3 mm right-to-left midline shift. 3. No abnormal enhancement or additional abnormality of the brain identified. 4. MRA degraded by motion artifact. No proximal large vessel occlusion. 5. Consider repeat MRI/MRA when patient is able to hold still. Electronically Signed   By: Kristine Garbe M.D.   On: 07/14/2018 05:08   Dg Chest [facility]  1 View  Result Date: 07/13/2018 CLINICAL DATA:  Leukocytosis EXAM: PORTABLE CHEST 1 VIEW COMPARISON:  09/07/2014 FINDINGS: Cardiac shadow is within normal limits. The lungs are well aerated bilaterally. No focal infiltrate or sizable effusion is seen. Chronic changes in the left base are again noted. No bony abnormality is noted. IMPRESSION: No active disease. Electronically Signed   By: Inez Catalina M.D.   On: 07/13/2018 21:46   TTE - Left ventricle: The cavity size was normal. There was moderate   concentric hypertrophy. Systolic function was vigorous.  The   estimated ejection fraction was in the range of 65% to 70%. Wall   motion was normal; there were no regional wall motion   abnormalities. - Aortic valve: There was no regurgitation. - Mitral valve: There was mild regurgitation. - Left atrium: The atrium was moderately dilated. - Right ventricle: The cavity size was normal. Wall thickness was   normal. Systolic function was normal. - Right atrium: The atrium was moderately dilated. - Tricuspid valve: There was moderate regurgitation. - Pulmonary arteries: Systolic pressure was moderately increased.   PA peak pressure: 47 mm Hg (S). - Inferior vena cava: The vessel was normal in size. Impressions: - No cardiac source of emboli was indentified.   PHYSICAL EXAM  Temp:  [97.4 F (36.3 C)-98.3 F (36.8 C)] 98.3 F (36.8 C) (09/01 1104) Pulse Rate:  [57-69] 60 (09/01 1104) Resp:  [18-20] 20 (09/01 1104) BP: (149-159)/(52-64) 149/62 (09/01 1104) SpO2:  [94 %-99 %] 96 % (09/01 1104)  General - obese elderly Caucasian lady not in distress  Ophthalmologic - fundi not visualized due to noncooperation.  Cardiovascular - Regular rate and rhythm with no murmur.  Mental Status -  Level of arousal and orientation to time, place, and person were intact. Language including expression, naming, repetition, comprehension was assessed and found intact. Fund of Knowledge was assessed and was intact.  Cranial Nerves II - XII - II - dense left homonymous hemianopia. III, IV, VI - Extraocular movements intact. V - Facial sensation intact bilaterally. VII - left nasolabial fold flattening. VIII - Hearing & vestibular intact bilaterally. X - Palate elevates symmetrically, no dysarthria. XI - Chin turning & shoulder shrug intact bilaterally. XII - Tongue protrusion intact.  Motor Strength - The patient's strength was normal in RUE and RLE. However, left UE 0/5 and LLE 2+/5 iliopsoas, 2/5 knee flexion, 0/5 distally.  Bulk was normal and  fasciculations were absent.   Motor Tone - Muscle tone was assessed at the neck and appendages and was normal.  Reflexes - The patient's reflexes were symmetrical in all extremities and she had left babinski.  Sensory - Light touch, temperature/pinprick were assessed and were decreased on the left, about 50% of right LE and 75% of right UE.  Coordination - The patient had normal movements in the right hands with no ataxia or dysmetria.  Tremor was absent.  Gait and Station - deferred.   ASSESSMENT/PLAN [name] is a 72 y.o. female with history of PAF, HLD, HTN, obesity admitted for HA, vomiting after colonoscopy. CT found to have right occipitoparietal large ICH. No tPA given due to [place].    ICH:  right occipitoparietal ICH, likely related to   Hypertensive emergency due to vomiting with colonoscopy.   Resultant left hemianopia and left hemiplegia  CT head right occipitoparietal ICH 50cc  CT head repeat x 2 with stable hematoma  MRI  Right occipitoparietal ICH, stable with focal mass effects  MRA  No LVO but motion degraded  repeat MRI brain with and without contrast as well as MRA head once ICH resolves to rule out malignancy or AVM or aneurysm  2D Echo EF 65-70%  LDL 49  HgbA1c 5.6  SCDs for VTE prophylaxis  aspirin 81 mg daily prior to admission, now on No antithrombotic.   Ongoing aggressive stroke risk factor management  Therapy recommendations:  CIR  Disposition:  Pending   Cerebral edema  9mm midline shift on MRI  Repeat CT 07/16/18 stable hematoma with midline shift at 10mm  No need of 3% at this time  NSG on board  Close monitoring neuro check  Na 140->138->136  PAF  On flecainide   On metoprolol  Currently NSR  On ASA PTA, now on no antithrombotic due to [place]  Hypertensive emergency Stable Off cardene on home metoprolol 25mg  bid  BP goal < 160  Tobacco abuse  Current smoker  Smoking cessation counseling provided  Pt  is willing to quit  Other Stroke Risk Factors  Advanced age  Obesity, Body mass index is 31.99 kg/m.   Other Active Problems  Colon polyps  Leukocytosis - 19.1->22.5->17.0->15.6->10.6  Hypokalemia - supplement 3.1->3.8->3.6 UA WBC 21-50 - asymptomatic   Hospital day # 5   . I had a long discussion with the patient and [name] son regarding intracerebral hemorrhage and answered questions about [name] care .Transfer to inpatient rehabilitation when bed available over the next 1-2 days. Antony Contras, MD Medical Director [facility] Pager: [phone] 07/18/2018 12:32 PM   Stroke Neurology 07/18/2018 12:32 PM     To contact Stroke Continuity provider, please refer to [email or web address] After hours, contact General Neurology

## 2018-07-18 NOTE — Plan of Care (Signed)
Patient stable, discussed POC with patient and family, agreeable with plan, denies question/concerns at this time.  

## 2018-07-19 ENCOUNTER — Other Ambulatory Visit: Payer: Self-pay

## 2018-07-19 ENCOUNTER — Encounter (HOSPITAL_COMMUNITY): Payer: Self-pay | Admitting: Nurse Practitioner

## 2018-07-19 ENCOUNTER — Inpatient Hospital Stay (HOSPITAL_COMMUNITY)
Admission: AD | Admit: 2018-07-19 | Discharge: 2018-08-16 | DRG: 057 | Disposition: A | Discharge status: Skilled Nursing Facility | Payer: Medicare Other | Source: Intra-hospital | Attending: Physical Medicine & Rehabilitation | Admitting: Physical Medicine & Rehabilitation

## 2018-07-19 DIAGNOSIS — K219 Gastro-esophageal reflux disease without esophagitis: Secondary | ICD-10-CM | POA: Diagnosis present

## 2018-07-19 DIAGNOSIS — Z634 Disappearance and death of family member: Secondary | ICD-10-CM | POA: Diagnosis not present

## 2018-07-19 DIAGNOSIS — I480 Paroxysmal atrial fibrillation: Secondary | ICD-10-CM

## 2018-07-19 DIAGNOSIS — I615 Nontraumatic intracerebral hemorrhage, intraventricular: Secondary | ICD-10-CM | POA: Diagnosis not present

## 2018-07-19 DIAGNOSIS — Z8249 Family history of ischemic heart disease and other diseases of the circulatory system: Secondary | ICD-10-CM | POA: Diagnosis not present

## 2018-07-19 DIAGNOSIS — Z823 Family history of stroke: Secondary | ICD-10-CM | POA: Diagnosis not present

## 2018-07-19 DIAGNOSIS — M79652 Pain in left thigh: Secondary | ICD-10-CM | POA: Diagnosis not present

## 2018-07-19 DIAGNOSIS — I69292 Facial weakness following other nontraumatic intracranial hemorrhage: Secondary | ICD-10-CM

## 2018-07-19 DIAGNOSIS — I69254 Hemiplegia and hemiparesis following other nontraumatic intracranial hemorrhage affecting left non-dominant side: Secondary | ICD-10-CM | POA: Diagnosis not present

## 2018-07-19 DIAGNOSIS — Z23 Encounter for immunization: Secondary | ICD-10-CM

## 2018-07-19 DIAGNOSIS — I10 Essential (primary) hypertension: Secondary | ICD-10-CM | POA: Diagnosis present

## 2018-07-19 DIAGNOSIS — I639 Cerebral infarction, unspecified: Secondary | ICD-10-CM | POA: Diagnosis not present

## 2018-07-19 DIAGNOSIS — G8114 Spastic hemiplegia affecting left nondominant side: Secondary | ICD-10-CM | POA: Diagnosis not present

## 2018-07-19 DIAGNOSIS — E669 Obesity, unspecified: Secondary | ICD-10-CM | POA: Diagnosis present

## 2018-07-19 DIAGNOSIS — F17210 Nicotine dependence, cigarettes, uncomplicated: Secondary | ICD-10-CM | POA: Diagnosis present

## 2018-07-19 DIAGNOSIS — Z6831 Body mass index (BMI) 31.0-31.9, adult: Secondary | ICD-10-CM

## 2018-07-19 DIAGNOSIS — Z90710 Acquired absence of both cervix and uterus: Secondary | ICD-10-CM | POA: Diagnosis not present

## 2018-07-19 DIAGNOSIS — M25531 Pain in right wrist: Secondary | ICD-10-CM | POA: Diagnosis not present

## 2018-07-19 DIAGNOSIS — R52 Pain, unspecified: Secondary | ICD-10-CM

## 2018-07-19 DIAGNOSIS — B9620 Unspecified Escherichia coli [E. coli] as the cause of diseases classified elsewhere: Secondary | ICD-10-CM | POA: Diagnosis present

## 2018-07-19 DIAGNOSIS — S79922A Unspecified injury of left thigh, initial encounter: Secondary | ICD-10-CM | POA: Diagnosis not present

## 2018-07-19 DIAGNOSIS — G8110 Spastic hemiplegia affecting unspecified side: Secondary | ICD-10-CM

## 2018-07-19 DIAGNOSIS — I959 Hypotension, unspecified: Secondary | ICD-10-CM | POA: Diagnosis not present

## 2018-07-19 DIAGNOSIS — I611 Nontraumatic intracerebral hemorrhage in hemisphere, cortical: Secondary | ICD-10-CM | POA: Diagnosis not present

## 2018-07-19 DIAGNOSIS — R339 Retention of urine, unspecified: Secondary | ICD-10-CM

## 2018-07-19 DIAGNOSIS — Z7982 Long term (current) use of aspirin: Secondary | ICD-10-CM

## 2018-07-19 DIAGNOSIS — R414 Neurologic neglect syndrome: Secondary | ICD-10-CM | POA: Diagnosis not present

## 2018-07-19 DIAGNOSIS — M6281 Muscle weakness (generalized): Secondary | ICD-10-CM | POA: Diagnosis not present

## 2018-07-19 DIAGNOSIS — I613 Nontraumatic intracerebral hemorrhage in brain stem: Secondary | ICD-10-CM | POA: Diagnosis not present

## 2018-07-19 DIAGNOSIS — I499 Cardiac arrhythmia, unspecified: Secondary | ICD-10-CM | POA: Diagnosis not present

## 2018-07-19 DIAGNOSIS — F320 Major depressive disorder, single episode, mild: Secondary | ICD-10-CM

## 2018-07-19 DIAGNOSIS — H53462 Homonymous bilateral field defects, left side: Secondary | ICD-10-CM | POA: Diagnosis present

## 2018-07-19 DIAGNOSIS — R278 Other lack of coordination: Secondary | ICD-10-CM | POA: Diagnosis not present

## 2018-07-19 DIAGNOSIS — K5900 Constipation, unspecified: Secondary | ICD-10-CM

## 2018-07-19 DIAGNOSIS — M25512 Pain in left shoulder: Secondary | ICD-10-CM | POA: Diagnosis not present

## 2018-07-19 DIAGNOSIS — E785 Hyperlipidemia, unspecified: Secondary | ICD-10-CM | POA: Diagnosis present

## 2018-07-19 DIAGNOSIS — R001 Bradycardia, unspecified: Secondary | ICD-10-CM | POA: Diagnosis present

## 2018-07-19 DIAGNOSIS — W19XXXA Unspecified fall, initial encounter: Secondary | ICD-10-CM

## 2018-07-19 DIAGNOSIS — I951 Orthostatic hypotension: Secondary | ICD-10-CM | POA: Diagnosis not present

## 2018-07-19 DIAGNOSIS — W050XXA Fall from non-moving wheelchair, initial encounter: Secondary | ICD-10-CM | POA: Diagnosis not present

## 2018-07-19 DIAGNOSIS — Z91048 Other nonmedicinal substance allergy status: Secondary | ICD-10-CM

## 2018-07-19 DIAGNOSIS — Z880 Allergy status to penicillin: Secondary | ICD-10-CM | POA: Diagnosis not present

## 2018-07-19 DIAGNOSIS — R41841 Cognitive communication deficit: Secondary | ICD-10-CM | POA: Diagnosis not present

## 2018-07-19 DIAGNOSIS — I619 Nontraumatic intracerebral hemorrhage, unspecified: Secondary | ICD-10-CM | POA: Diagnosis present

## 2018-07-19 DIAGNOSIS — G8194 Hemiplegia, unspecified affecting left nondominant side: Secondary | ICD-10-CM | POA: Diagnosis not present

## 2018-07-19 DIAGNOSIS — S6991XA Unspecified injury of right wrist, hand and finger(s), initial encounter: Secondary | ICD-10-CM | POA: Diagnosis not present

## 2018-07-19 DIAGNOSIS — S4992XA Unspecified injury of left shoulder and upper arm, initial encounter: Secondary | ICD-10-CM | POA: Diagnosis not present

## 2018-07-19 DIAGNOSIS — M2550 Pain in unspecified joint: Secondary | ICD-10-CM | POA: Diagnosis not present

## 2018-07-19 DIAGNOSIS — Z79899 Other long term (current) drug therapy: Secondary | ICD-10-CM

## 2018-07-19 DIAGNOSIS — S0003XA Contusion of scalp, initial encounter: Secondary | ICD-10-CM | POA: Diagnosis not present

## 2018-07-19 DIAGNOSIS — Z7401 Bed confinement status: Secondary | ICD-10-CM | POA: Diagnosis not present

## 2018-07-19 DIAGNOSIS — R2689 Other abnormalities of gait and mobility: Secondary | ICD-10-CM | POA: Diagnosis not present

## 2018-07-19 DIAGNOSIS — F329 Major depressive disorder, single episode, unspecified: Secondary | ICD-10-CM | POA: Diagnosis present

## 2018-07-19 MED ORDER — DIPHENHYDRAMINE HCL 50 MG/ML IJ SOLN
12.5000 mg | Freq: Once | INTRAMUSCULAR | Status: AC
  Administered 2018-07-19: 12.5 mg via INTRAVENOUS
  Filled 2018-07-19: qty 1

## 2018-07-19 MED ORDER — ZOLPIDEM TARTRATE 5 MG PO TABS
2.5000 mg | ORAL_TABLET | Freq: Every evening | ORAL | Status: DC | PRN
  Administered 2018-07-21 – 2018-08-13 (×19): 2.5 mg via ORAL
  Filled 2018-07-19 (×24): qty 1

## 2018-07-19 MED ORDER — SERTRALINE HCL 50 MG PO TABS
50.0000 mg | ORAL_TABLET | Freq: Every day | ORAL | Status: DC
  Administered 2018-07-20 – 2018-08-15 (×28): 50 mg via ORAL
  Filled 2018-07-19 (×28): qty 1

## 2018-07-19 MED ORDER — POLYETHYLENE GLYCOL 3350 17 G PO PACK
17.0000 g | PACK | Freq: Once | ORAL | Status: AC
  Administered 2018-07-19: 17 g via ORAL
  Filled 2018-07-19: qty 1

## 2018-07-19 MED ORDER — METOPROLOL TARTRATE 50 MG PO TABS
50.0000 mg | ORAL_TABLET | Freq: Two times a day (BID) | ORAL | Status: DC
  Administered 2018-07-19: 50 mg via ORAL
  Filled 2018-07-19: qty 1

## 2018-07-19 MED ORDER — BISACODYL 10 MG RE SUPP
10.0000 mg | Freq: Every day | RECTAL | Status: DC | PRN
  Administered 2018-07-25: 10 mg via RECTAL
  Filled 2018-07-19: qty 1

## 2018-07-19 MED ORDER — SENNOSIDES-DOCUSATE SODIUM 8.6-50 MG PO TABS: 1.0000 | ORAL_TABLET | Freq: Two times a day (BID) | ORAL | Status: DC

## 2018-07-19 MED ORDER — ALUM & MAG HYDROXIDE-SIMETH 200-200-20 MG/5ML PO SUSP
30.0000 mL | ORAL | Status: DC | PRN
  Administered 2018-07-22 – 2018-07-27 (×2): 30 mL via ORAL
  Filled 2018-07-19 (×2): qty 30

## 2018-07-19 MED ORDER — ASPIRIN EC 81 MG PO TBEC
81.0000 mg | DELAYED_RELEASE_TABLET | Freq: Every day | ORAL | Status: DC
  Administered 2018-07-20 – 2018-08-16 (×28): 81 mg via ORAL
  Filled 2018-07-19 (×28): qty 1

## 2018-07-19 MED ORDER — LIDOCAINE HCL URETHRAL/MUCOSAL 2 % EX GEL
CUTANEOUS | Status: DC | PRN
  Filled 2018-07-19: qty 5

## 2018-07-19 MED ORDER — ACETAMINOPHEN 325 MG PO TABS
650.0000 mg | ORAL_TABLET | Freq: Four times a day (QID) | ORAL | Status: DC | PRN
  Administered 2018-07-19: 650 mg via ORAL

## 2018-07-19 MED ORDER — LABETALOL HCL 5 MG/ML IV SOLN: 5.0000 mg | INTRAVENOUS | Status: DC | PRN

## 2018-07-19 MED ORDER — ZOLPIDEM TARTRATE 5 MG PO TABS: 5.0000 mg | ORAL_TABLET | Freq: Every evening | ORAL | Status: DC | PRN

## 2018-07-19 MED ORDER — ACETAMINOPHEN 325 MG PO TABS
325.0000 mg | ORAL_TABLET | ORAL | Status: DC | PRN
  Administered 2018-07-20 – 2018-07-23 (×12): 650 mg via ORAL
  Administered 2018-07-23: 325 mg via ORAL
  Administered 2018-07-23 – 2018-07-24 (×5): 650 mg via ORAL
  Administered 2018-07-24: 325 mg via ORAL
  Administered 2018-07-25 – 2018-07-28 (×11): 650 mg via ORAL
  Administered 2018-07-29: 325 mg via ORAL
  Administered 2018-07-29 – 2018-08-10 (×29): 650 mg via ORAL
  Administered 2018-08-10: 325 mg via ORAL
  Administered 2018-08-10 – 2018-08-16 (×11): 650 mg via ORAL
  Filled 2018-07-19 (×75): qty 2

## 2018-07-19 MED ORDER — FLECAINIDE ACETATE 50 MG PO TABS
100.0000 mg | ORAL_TABLET | Freq: Every day | ORAL | Status: DC
  Administered 2018-07-20 – 2018-08-16 (×28): 100 mg via ORAL
  Filled 2018-07-19 (×28): qty 2

## 2018-07-19 MED ORDER — PROCHLORPERAZINE EDISYLATE 10 MG/2ML IJ SOLN: 5.0000 mg | Freq: Four times a day (QID) | INTRAMUSCULAR | Status: DC | PRN

## 2018-07-19 MED ORDER — FLEET ENEMA 7-19 GM/118ML RE ENEM: 1.0000 | ENEMA | Freq: Once | RECTAL | Status: DC | PRN

## 2018-07-19 MED ORDER — GUAIFENESIN-DM 100-10 MG/5ML PO SYRP: 5.0000 mL | ORAL_SOLUTION | Freq: Four times a day (QID) | ORAL | Status: DC | PRN

## 2018-07-19 MED ORDER — PROCHLORPERAZINE 25 MG RE SUPP: 12.5000 mg | Freq: Four times a day (QID) | RECTAL | Status: DC | PRN

## 2018-07-19 MED ORDER — PROCHLORPERAZINE MALEATE 5 MG PO TABS: 5.0000 mg | ORAL_TABLET | Freq: Four times a day (QID) | ORAL | Status: DC | PRN

## 2018-07-19 MED ORDER — ACETAMINOPHEN 325 MG PO TABS
650.0000 mg | ORAL_TABLET | Freq: Four times a day (QID) | ORAL | Status: DC | PRN
  Administered 2018-07-19 – 2018-07-20 (×2): 650 mg via ORAL
  Filled 2018-07-19 (×2): qty 2

## 2018-07-19 MED ORDER — ASPIRIN EC 81 MG PO TBEC: 81.0000 mg | DELAYED_RELEASE_TABLET | Freq: Every day | ORAL | Status: DC

## 2018-07-19 MED ORDER — SENNOSIDES-DOCUSATE SODIUM 8.6-50 MG PO TABS
2.0000 | ORAL_TABLET | Freq: Every day | ORAL | Status: DC
  Administered 2018-07-19 – 2018-08-15 (×27): 2 via ORAL
  Filled 2018-07-19 (×27): qty 2

## 2018-07-19 MED ORDER — POLYETHYLENE GLYCOL 3350 17 G PO PACK
17.0000 g | PACK | Freq: Every day | ORAL | Status: DC | PRN
  Administered 2018-07-24: 17 g via ORAL
  Filled 2018-07-19 (×2): qty 1

## 2018-07-19 MED ORDER — BENAZEPRIL HCL 20 MG PO TABS
20.0000 mg | ORAL_TABLET | Freq: Two times a day (BID) | ORAL | Status: DC
  Administered 2018-07-19 – 2018-07-22 (×6): 20 mg via ORAL
  Filled 2018-07-19 (×6): qty 1

## 2018-07-19 MED ORDER — ONDANSETRON HCL 4 MG/2ML IJ SOLN: 4.0000 mg | Freq: Three times a day (TID) | INTRAMUSCULAR | Status: DC | PRN

## 2018-07-19 MED ORDER — ORAL CARE MOUTH RINSE: 15.0000 mL | Freq: Two times a day (BID) | OROMUCOSAL | Status: DC

## 2018-07-19 MED ORDER — FLECAINIDE ACETATE 50 MG PO TABS
50.0000 mg | ORAL_TABLET | Freq: Every day | ORAL | Status: DC
  Administered 2018-07-19 – 2018-08-15 (×28): 50 mg via ORAL
  Filled 2018-07-19 (×29): qty 1

## 2018-07-19 MED ORDER — HYDRALAZINE HCL 20 MG/ML IJ SOLN: 10.0000 mg | INTRAMUSCULAR | Status: DC | PRN

## 2018-07-19 MED ORDER — TRAZODONE HCL 50 MG PO TABS: 25.0000 mg | ORAL_TABLET | Freq: Every evening | ORAL | Status: DC | PRN

## 2018-07-19 MED ORDER — PANTOPRAZOLE SODIUM 40 MG PO TBEC
40.0000 mg | DELAYED_RELEASE_TABLET | Freq: Every day | ORAL | Status: DC
  Administered 2018-07-20 – 2018-08-16 (×28): 40 mg via ORAL
  Filled 2018-07-19 (×28): qty 1

## 2018-07-19 MED ORDER — DIPHENHYDRAMINE HCL 12.5 MG/5ML PO ELIX
12.5000 mg | ORAL_SOLUTION | Freq: Four times a day (QID) | ORAL | Status: DC | PRN
  Administered 2018-07-20 – 2018-07-28 (×10): 25 mg via ORAL
  Filled 2018-07-19: qty 10
  Filled 2018-07-19: qty 0
  Filled 2018-07-19 (×8): qty 10

## 2018-07-19 MED ORDER — SERTRALINE HCL 50 MG PO TABS: 50.0000 mg | ORAL_TABLET | Freq: Every day | ORAL | Status: DC

## 2018-07-19 NOTE — Progress Notes (Signed)
  Speech Language Pathology Treatment: Cognitive-Linquistic  Patient Details Name: [name] MRN: [id number] DOB: [birth year] Today's Date: 07/19/2018 Time: 6378-5885 SLP Time Calculation (min) (ACUTE ONLY): 17 min  Assessment / Plan / Recommendation Clinical Impression  Skilled treatment session focused on cognition. SLP facilitated session by providing Mod A visual scanning task, Min A for intellectual awareness and Mod A for anticipatory awareness of impact that left visual field deficits will have on independent living. Education provided to son and compensatory strategies to help pt track to left of midline. All questions answered to pt and son's satisfaction. CIR continues to appropriate recommendation.    HPI HPI: [name] is a 72 y.o. female past medical history of dyslipidemia, GERD, hypertension, obesity, PAF.  Patient was noted to have a headache this past Saturday and had bouts of vomiting.  On 07/12/2018 she appeared normal until approximately 1300 hrs. when she was brought back to go get her colonoscopy.  In PACU it was noted that she was not moving her left side.  Patient was noted not to be able to get out of bed this morning and have severe left-sided weakness thus she was brought to the emergency department.  CT of the brain was obtained showing a 4.7 x 4.2 x 5.3 cm high density hematoma centered in the right parietal lobe with a thin rim of edema.        SLP Plan  Continue with current plan of care       Recommendations                   General recommendations: Rehab consult Oral Care Recommendations: Oral care BID Follow up Recommendations: Inpatient Rehab SLP Visit Diagnosis: Attention and concentration deficit Attention and concentration deficit following: Nontraumatic intracerebral hemorrhage Plan: Continue with current plan of care       GO                Anthany Thornhill 07/19/2018, 10:56 AM

## 2018-07-19 NOTE — Progress Notes (Signed)
Pt d/c to rehab RM 4W-09. Pt is stable, rm air with no new concerns. Report given to receiving nurse

## 2018-07-19 NOTE — Progress Notes (Signed)
PMR Admission Coordinator Pre-Admission Assessment  Patient: [name] is an 72 y.o., female MRN: [id number] DOB: 1946/09/25 Height: 5\' 6"  (167.6 cm) Weight: 89.9 kg                                                                                                                                                  Insurance Information HMO:     PPO:      PCP:      IPA:      80/20:      OTHER: no HMO PRIMARY: Medicare a and b      Policy#: [id number]      Subscriber: pt Benefits:  Phone #: online     Name: 07/16/2018 Eff. Date: 10/17/2012     Deduct: $1364      Out of Pocket Max: none      Life Max: none CIR: 100%      SNF: 20 full days Outpatient: 80%     Co-Pay: 20% Home Health: 100%      Co-Pay: none DME: 80%     Co-Pay: 20% Providers: pt choice  SECONDARY: [employer] indemnity      Policy#: [id number]      Subscriber: pt  Medicaid Application Date:       Case Manager:  Disability Application Date:       Case Worker:   Emergency Contact Information         Contact Information    Name Relation Home Work Mobile   [place] Daughter   [phone]   [place], [place]   [phone]     Current Medical History  Patient Admitting Diagnosis: right parietal intracranial hemorrhage   History of Present Illness:Katherine N Pickardis a 72 y.o.femalewith history of hypertension, PAF, obesity, who was admitted on 07/13/2018 with progressive headache, nausea/vomiting, and left-sided weakness. CT of head done revealing right parietal lobehematoma with small local SAH and SDH with local mass-effect. Dr. Annette Stable felt thathemorrhage wasdue to hypertension and recommended monitoring with repeat CT.MRI/MRA brain done 8/28 and revealed stable hematoma right parietal lobe with stable mass-effect and MRA limited by motion artifact--no large vessel occlusion noted.ASAdiscontinued andDr. Erlinda Hong recommends repeating MRI/MRAbrain once ICH resolved to rule out malignancy,aneurysm  or AVM.Therapy evaluations complete with recommendation for CIR and patient admitted 07/19/18.    Complete NIHSS TOTAL: 9  Past Medical History      Past Medical History:  Diagnosis Date  . Dyslipidemia   . GERD (gastroesophageal reflux disease)   . High risk medication use    on Flecainide since January of 2013  . Hypertension   . Obesity   . PAF (paroxysmal atrial fibrillation) (HCC)    normal stress echo in 2011    Family History  family history includes Alzheimer's disease in her mother; Cancer in her brother; Heart disease in her father; Lung cancer in  her brother; Stroke in her sister.  Prior Rehab/Hospitalizations:  Has the patient had major surgery during 100 days prior to admission? No  Current Medications   Current Facility-Administered Medications:  .  acetaminophen-codeine (TYLENOL #3) 300-30 MG per tablet 2 tablet, 2 tablet, Oral, Q6H PRN, Garvin Fila, MD, 2 tablet at 07/19/18 0410 .  benazepril (LOTENSIN) tablet 20 mg, 20 mg, Oral, BID, Rinehuls, David L, PA-C, 20 mg at 07/18/18 2140 .  flecainide (TAMBOCOR) tablet 100 mg, 100 mg, Oral, Daily, Aroor, Karena Addison R, MD, 100 mg at 07/18/18 1100 .  flecainide (TAMBOCOR) tablet 50 mg, 50 mg, Oral, QHS, Aroor, Lanice Schwab, MD, 50 mg at 07/18/18 2141 .  hydrALAZINE (APRESOLINE) injection 10 mg, 10 mg, Intravenous, Q4H PRN, Rinehuls, David L, PA-C .  labetalol (NORMODYNE,TRANDATE) injection 5-10 mg, 5-10 mg, Intravenous, Q2H PRN, Rosalin Hawking, MD, 10 mg at 07/17/18 0930 .  MEDLINE mouth rinse, 15 mL, Mouth Rinse, BID, Aroor, Lanice Schwab, MD, 15 mL at 07/18/18 2147 .  metoprolol tartrate (LOPRESSOR) tablet 50 mg, 50 mg, Oral, BID, Garvin Fila, MD, 50 mg at 07/18/18 2140 .  ondansetron (ZOFRAN) injection 4 mg, 4 mg, Intravenous, Q8H PRN, Aroor, Lanice Schwab, MD, 4 mg at 07/15/18 0639 .  pantoprazole (PROTONIX) EC tablet 40 mg, 40 mg, Oral, Daily, Rosalin Hawking, MD, 40 mg at 07/18/18 1100 .  senna-docusate  (Senokot-S) tablet 1 tablet, 1 tablet, Oral, BID, Aroor, Lanice Schwab, MD, 1 tablet at 07/18/18 2139 .  sertraline (ZOLOFT) tablet 50 mg, 50 mg, Oral, Daily, Rosalin Hawking, MD, 50 mg at 07/18/18 1100 .  zolpidem (AMBIEN) tablet 5 mg, 5 mg, Oral, QHS PRN, Garvin Fila, MD  Patients Current Diet:  Diet Order                  Diet regular Room service appropriate? Yes; Fluid consistency: Thin  Diet effective now               Precautions / Restrictions Precautions Precautions: Fall Precaution Comments: L visual deficit, L sided weakness Restrictions Weight Bearing Restrictions: No   Has the patient had 2 or more falls or a fall with injury in the past year?No  Prior Activity Level Community (5-7x/wk): independent and caring for 1 year old grandchild  Development worker, international aid / Paramedic Devices/Equipment: None Home Equipment: Bedside commode, Environmental consultant - 4 wheels, Grab bars - tub/shower, Hand held shower head, Shower seat - built in  Prior Device Use: Indicate devices/aids used by the patient prior to current illness, exacerbation or injury? None of the above  Prior Functional Level Prior Function Level of Independence: Independent Comments: driving, takes care of 35 year old grandson  Self Care: Did the patient need help bathing, dressing, using the toilet or eating?  Independent  Indoor Mobility: Did the patient need assistance with walking from room to room (with or without device)? Independent  Stairs: Did the patient need assistance with internal or external stairs (with or without device)? Independent  Functional Cognition: Did the patient need help planning regular tasks such as shopping or remembering to take medications? Independent  Current Functional Level Cognition  Overall Cognitive Status: Impaired/Different from baseline Current Attention Level: Selective Orientation Level: Oriented X4 Following Commands: Follows one step  commands consistently General Comments: pt attempting a search and find task on table. pt using a bright yellow piece of paper . pt required min cues but able to correctly locate the answer Awareness: Impaired Awareness Impairment:  Intellectual impairment Problem Solving: Impaired Problem Solving Impairment: Functional complex    Extremity Assessment (includes Sensation/Coordination)  Upper Extremity Assessment: LUE deficits/detail LUE Deficits / Details: flaccid, extensor tone prsent LUE Sensation: decreased light touch LUE Coordination: decreased fine motor, decreased gross motor  Lower Extremity Assessment: Defer to PT evaluation LLE Deficits / Details: AAROM/PROM WFL except ankle DF limited by about 20 degrees PROM; noted spasticity 2/4 on modified [place], strength hip flexion at least 2/5, knee extension not activated in supine, noted no active ankle movement LLE Sensation: decreased light touch    ADLs  Overall ADL's : Needs assistance/impaired Eating/Feeding: Set up Eating/Feeding Details (indicate cue type and reason): pt with chips placed on left side and required to scan to the L to locate the chips. pt states "where did she put my chips" pt again oriented to placement pt able to recall and find chips the second attempt Grooming: Moderate assistance Upper Body Bathing: Maximal assistance Lower Body Bathing: Total assistance Upper Body Dressing : Maximal assistance Lower Body Dressing: Maximal assistance Toilet Transfer: +2 for safety/equipment, Maximal assistance, +2 for physical assistance Toilet Transfer Details (indicate cue type and reason): simulated EOB to chair  Functional mobility during ADLs: (Deferred this session) General ADL Comments: pt attempting OOB to chair with stand pivot this session. pt requires cues and tactile input at EOB to work on static sitting balance. pt able to complete transfer this session with mod cues    Mobility  Overal bed mobility:  Needs Assistance Bed Mobility: Rolling, Sidelying to Sit Rolling: Min assist Sidelying to sit: Mod assist General bed mobility comments: pt able to roll toward L side with use of bed rail; assistance required for L LE to EOB and elevating trunk into sitting    Transfers  Overall transfer level: Needs assistance Equipment used: None Transfer via Lift Equipment: [employer] Transfers: Sit to/from Stand, Risk manager Sit to Stand: +2 physical assistance, Max assist Stand pivot transfers: +2 physical assistance, Max assist General transfer comment: +2 assist to power up into standing and maintain balance in standing and to pivot toward R side; L LE blocked and faciliation at glutes and trunk for posture     Ambulation / Gait / Stairs / Wheelchair Mobility  Ambulation/Gait General Gait Details: unable at this time.     Posture / Balance Dynamic Sitting Balance Sitting balance - Comments: able to fix posture and alignment momentarily with min to mod verbal and tactile cues.  needs increased time to notice items on L side Balance Overall balance assessment: Needs assistance Sitting-balance support: Feet supported, Single extremity supported Sitting balance-Leahy Scale: Poor Sitting balance - Comments: able to fix posture and alignment momentarily with min to mod verbal and tactile cues.  needs increased time to notice items on L side Postural control: Left lateral lean, Other (comment)(anterior lean) Standing balance support: Bilateral upper extremity supported Standing balance-Leahy Scale: Zero Standing balance comment: +2 A in standing for pivot to chair    Special needs/care consideration BiPAP/CPAP  N/a CPM n/a Continuous Drip IV n/a Dialysis n/a Life Vest n/a Oxygen n/a Special Bed n/a Trach Size n/a Wound Vac n/a Skin intact Bowel mgmt: LBM 07/14/18 Bladder mgmt: #16 foley inserted 07/15/18 Diabetic mgmt Hgb A1c 5.6 Current smoker    Previous Home  Environment Living Arrangements: Alone  Lives With: Alone Available Help at Discharge: Family, Available 24 hours/day Type of Home: House Home Layout: One level Home Access: Stairs to enter Entrance Stairs-Rails: Right, Left, Can  reach both Entrance Stairs-Number of Steps: 5 Bathroom Shower/Tub: Multimedia programmer: Handicapped height Bathroom Accessibility: Yes How Accessible: Accessible via walker [place]: No  Discharge Living Setting Plans for Discharge Living Setting: Patient's home, Alone Type of Home at Discharge: House Discharge Home Layout: One level Discharge Home Access: Stairs to enter Entrance Stairs-Rails: Right, Left, Can reach both Entrance Stairs-Number of Steps: 5 Discharge Bathroom Shower/Tub: Walk-in shower Discharge Bathroom Toilet: Handicapped height Discharge Bathroom Accessibility: Yes How Accessible: Accessible via walker Does the patient have any problems obtaining your medications?: No  Social/Family/Support Systems Patient Roles: Parent, Caregiver Contact Information: Coralyn Mark, son is main contact Anticipated Caregiver: son, daughter, and other family Anticipated Caregiver's Contact Information: see above Ability/Limitations of Caregiver: son and daughter work but have family  and others who will rotate to help Caregiver Availability: 24/7 Discharge Plan Discussed with Primary Caregiver: Yes Is Caregiver In Agreement with Plan?: Yes Does Caregiver/Family have Issues with Lodging/Transportation while Pt is in Rehab?: No  I spoke with son, Coralyn Mark on 8/30. He and his sister work but will arrange for care 24/7 at home. [place], is retired, friends and family. Patient's spouse died last year and she cared for him so they state they will do the same.  Goals/Additional Needs Patient/Family Goal for Rehab: min assist with PT, OT and supervision SLP Expected length of stay: ELOS 21 to 25 days Pt/Family Agrees to Admission and  willing to participate: Yes Program Orientation Provided & Reviewed with Pt/Caregiver Including Roles  & Responsibilities: Yes  Decrease burden of Care through IP rehab admission: n/a  Possible need for SNF placement upon discharge: not anticipated  Patient Condition: This patient's medical and functional status has changed since the consult dated: 07/15/2018 in which the Rehabilitation Physician determined and documented that the patient's condition is appropriate for intensive rehabilitative care in an inpatient rehabilitation facility. See "History of Present Illness" (above) for medical update. Functional changes are: Max A +2 transfers. Patient's medical and functional status update has been discussed with the Rehabilitation physician and patient remains appropriate for inpatient rehabilitation. Will admit to inpatient rehab today.  Preadmission Screen Completed By: Danne Baxter, RN  with updates provided by Gunnar Fusi, 07/19/2018 10:52 AM ______________________________________________________________________   Discussed status with Dr. Posey Pronto on 07/19/18 at 1055 and received telephone approval for admission today.  Admission Coordinator:  Gunnar Fusi, time 1055/Date 07/19/18           Cosigned by: Jamse Arn, MD at 07/19/2018 11:06 AM  Revision History

## 2018-07-19 NOTE — Care Management Important Message (Signed)
Important Message  Patient Details  Name: [name] MRN: [id number] Date of Birth: 1946-01-17   Medicare Important Message Given:  Yes    Orbie Pyo 07/19/2018, 3:15 PM

## 2018-07-19 NOTE — Progress Notes (Signed)
Patient ID: [name], female   DOB: Aug 14, 1946, 72 y.o.   MRN: [id number] Patient admitted to 4W09 via bed, escorted by nursing staff and daughter.  Patient and family oriented to unit, especially to fall prevention policy, personal belongings policy, and visitation policy.  Urinary catheter in place, will clarify with PA regarding need to remove.  Will continue to monitor.  Brita Romp, RN

## 2018-07-19 NOTE — Discharge Summary (Addendum)
Stroke Discharge Summary  Patient ID: [name]   MRN: [id number]      DOB: 12-17-1945  Date of Admission: 07/13/2018 Date of Discharge: 07/19/2018  Attending Physician:  Rosalin Hawking, MD, Stroke MD Consultant(s):   Neurosurgery - Dr Annette Stable  ;  PM&R - Dr Letta Pate Patient's PCP:  [name], MD  Discharge Diagnoses: Large right occipitoparietal ICH due to hypertensive urgency Active Problems:   ICH (intracerebral hemorrhage) ([place]) Cytotoxic edema Left homonymous hemianopia  Past Medical History:  Diagnosis Date  . Dyslipidemia   . GERD (gastroesophageal reflux disease)   . High risk medication use    on Flecainide since January of 2013  . Hypertension   . Obesity   . PAF (paroxysmal atrial fibrillation) (HCC)    normal stress echo in 2011   Past Surgical History:  Procedure Laterality Date  . +    . ABDOMINAL HYSTERECTOMY    . ANKLE SURGERY    . CARDIOVASCULAR STRESS TEST  07/21/2007   EF 80%, NORMAL NONDIAGONISTIC FOR ISCHEMIA  . STRESS ECHO TEST  07/25/2010   EF 60%  . WRIST SURGERY     RIGHT WRIST       LABORATORY STUDIES CBC    Component Value Date/Time   WBC 9.2 07/18/2018 0552   RBC 4.65 07/18/2018 0552   HGB 13.8 07/18/2018 0552   HCT 41.4 07/18/2018 0552   PLT 219 07/18/2018 0552   MCV 89.0 07/18/2018 0552   MCH 29.7 07/18/2018 0552   MCHC 33.3 07/18/2018 0552   RDW 13.0 07/18/2018 0552   LYMPHSABS 1.0 07/13/2018 1141   MONOABS 1.5 (H) 07/13/2018 1141   EOSABS 0.0 07/13/2018 1141   BASOSABS 0.1 07/13/2018 1141   CMP    Component Value Date/Time   NA 139 07/18/2018 0552   K 3.7 07/18/2018 0552   CL 105 07/18/2018 0552   CO2 28 07/18/2018 0552   GLUCOSE 110 (H) 07/18/2018 0552   BUN 17 07/18/2018 0552   CREATININE 0.79 07/18/2018 0552   CALCIUM 9.5 07/18/2018 0552   PROT 6.6 07/13/2018 0940   ALBUMIN 4.1 07/13/2018 0940   AST 18 07/13/2018 0940   ALT 19 07/13/2018 0940   ALKPHOS 82 07/13/2018 0940   BILITOT 1.0 07/13/2018  0940   GFRNONAA >60 07/18/2018 0552   GFRAA >60 07/18/2018 0552   COAGS Lab Results  Component Value Date   INR 1.08 07/13/2018   INR 0.97 06/25/2011   INR 1.0 01/26/2009   Lipid Panel    Component Value Date/Time   CHOL 97 07/14/2018 0353   TRIG 110 07/14/2018 0353   HDL 26 (L) 07/14/2018 0353   CHOLHDL 3.7 07/14/2018 0353   VLDL 22 07/14/2018 0353   LDLCALC 49 07/14/2018 0353   HgbA1C  Lab Results  Component Value Date   HGBA1C 5.6 07/14/2018   Urinalysis    Component Value Date/Time   COLORURINE YELLOW 07/14/2018 0201   APPEARANCEUR CLEAR 07/14/2018 0201   LABSPEC 1.018 07/14/2018 0201   PHURINE 5.0 07/14/2018 0201   GLUCOSEU NEGATIVE 07/14/2018 0201   GLUCOSEU NEGATIVE 05/03/2012 1349   HGBUR SMALL (A) 07/14/2018 0201   BILIRUBINUR NEGATIVE 07/14/2018 0201   KETONESUR NEGATIVE 07/14/2018 0201   PROTEINUR NEGATIVE 07/14/2018 0201   UROBILINOGEN 0.2 05/03/2012 1349   NITRITE POSITIVE (A) 07/14/2018 0201   LEUKOCYTESUR SMALL (A) 07/14/2018 0201   Urine Drug Screen No results found for: LABOPIA, COCAINSCRNUR, LABBENZ, AMPHETMU, THCU, LABBARB  Alcohol Level No  results found for: [facility]   SIGNIFICANT DIAGNOSTIC STUDIES  Ct Head Wo Contrast 07/16/2018 IMPRESSION:  1. Right parietal hematoma is stable to minimally increased in size in comparison with the prior CT of the head given differences in slice selection measuring 63 cc.  2. Interval partial dispersion of subarachnoid hemorrhage over the right cerebral convexity.  3. Mild interval increase in edema surrounding the hematoma as well as associated mass effect with 4 mm right-to-left midline shift.    Ct Head Wo Contrast  07/13/2018 IMPRESSION:  1. 57 cc acute RIGHT parietal hematoma was 50 cc. Similar regional mass effect without midline shift.  2. Similar small volume RIGHT subarachnoid hemorrhage and 5 mm RIGHT subdural hematoma.   Ct Head Wo Contrast 07/13/2018 IMPRESSION:  50 cc acute hematoma  centered in the right parietal lobe. There is small volume local subarachnoid and subdural extension. Local mass effect without hydrocephalus or midline shift. No detectable underlying cause.    Mr Jodene Nam Head Wo Contrast 07/14/2018 IMPRESSION:  1. Extensive motion artifact.  2. Stable hematoma in the right parietal lobe given differences in technique. Stable associated edema and mass effect with partial effacement of right lateral ventricle and 3 mm right-to-left midline shift.  3. No abnormal enhancement or additional abnormality of the brain identified.  4. MRA degraded by motion artifact. No proximal large vessel occlusion.  5. Consider repeat MRI/MRA when patient is able to hold still.    Dg Chest Port 1 View 07/13/2018 IMPRESSION: No active disease.    TTE - Left ventricle: The cavity size was normal. There was moderate concentric hypertrophy. Systolic function was vigorous. The estimated ejection fraction was in the range of 65% to 70%. Wall motion was normal; there were no regional wall motion abnormalities. - Aortic valve: There was no regurgitation. - Mitral valve: There was mild regurgitation. - Left atrium: The atrium was moderately dilated. - Right ventricle: The cavity size was normal. Wall thickness was normal. Systolic function was normal. - Right atrium: The atrium was moderately dilated. - Tricuspid valve: There was moderate regurgitation. - Pulmonary arteries: Systolic pressure was moderately increased. PA peak pressure: 47 mm Hg (S). - Inferior vena cava: The vessel was normal in size. Impressions: - No cardiac source of emboli was indentified.      HISTORY OF PRESENT ILLNESS (Dr Aroor 07/13/2018)  [name] is a 72 y.o. female past medical history of dyslipidemia, GERD, hypertension, obesity, and PAF.  Patient was noted to have a headache this past Saturday and had bouts of vomiting.  On 07/12/2018 she appeared normal until approximately  1300 hrs. when she was brought back from her colonoscopy.  In PACU it was noted that she was not moving her left side as well in addition there is a.  Of time during PACU where her blood pressure did go up to 173/120 per family.  Patient was noted not to be able to get out of bed this morning and have severe left-sided weakness thus she was brought to the emergency department.  CT of the brain was obtained showing a 4.7 x 4.2 x 5.3 cm high density hematoma centered in the right parietal lobe with a thin rim of edema.  While in the ED she was immediately placed on Cardene due to her blood pressure being in the 160s 170s to bring her blood pressure down and given Zofran for nausea vomiting.  Of note she did not take her aspirin today or yesterday  LKW: 1300 hrs.  on 07/12/2018 tpa given?: no, intracranial hemorrhage Premorbid modified Rankin scale (mRS): 0 NIH score-5 ICH Score: 2--26% mortality  HOSPITAL COURSE [name] is a 72 y.o. female with history of PAF, HLD, HTN, obesity admitted for HA, vomiting after colonoscopy. CT found to have right occipitoparietal large ICH. No tPA given due to [place].    ICH:  right occipitoparietal ICH, likely related to   Hypertensive emergency due to vomiting with colonoscopy.   Resultant left hemianopia and left hemiplegia  CT head right occipitoparietal ICH 50cc  CT head repeat x 2 with stable hematoma  MRI  Right occipitoparietal ICH, stable with focal mass effects  MRA  No LVO but motion degraded  repeat MRI brain with and without contrast as well as MRA head once ICH resolves to rule out malignancy or AVM or aneurysm -> Outpatient per Dr Leonie Man F/U  2D Echo EF 65-70%  LDL 49  HgbA1c 5.6  SCDs for VTE prophylaxis  aspirin 81 mg daily prior to admission, now on No antithrombotic.   Ongoing aggressive stroke risk factor management  Therapy recommendations:  CIR  Disposition: Discharge to Inpatient Rehab today  Cerebral  edema  56mm midline shift on MRI  Repeat CT 07/16/18 stable hematoma with midline shift at 9mm  No need of 3% at this time  NSG on board  Close monitoring neuro check  Na 140->138->136  PAF  On flecainide   On metoprolol  Currently NSR  On ASA PTA, now on no antithrombotic due to [place]  Hypertensive emergency  Stable  Off cardene  on home metoprolol 25mg  bid  BP goal < 160  Tobacco abuse  Current smoker  Smoking cessation counseling provided  Pt is willing to quit  Other Stroke Risk Factors  Advanced age  Obesity, Body mass index is 31.99 kg/m.   Other Active Problems  Colon polyps  Leukocytosis - 19.1->22.5->17.0->15.6->10.6  Hypokalemia - supplement 3.1->3.8->3.6 UA WBC 21-50 - asymptomatic    DISCHARGE EXAM Blood pressure 130/69, pulse (!) 58, temperature 98 F (36.7 C), temperature source Oral, resp. rate 18, height 5\' 6"  (1.676 m), weight 89.9 kg, SpO2 100 %.   Vitals:   07/19/18 0302 07/19/18 0732 07/19/18 0925 07/19/18 1221  BP: (!) 152/50 (!) 145/66  130/69  Pulse: (!) 53 (!) 50  (!) 58  Resp: 18 15 18 18   Temp: 97.6 F (36.4 C) 97.8 F (36.6 C)  98 F (36.7 C)  TempSrc: Oral Oral Oral Oral  SpO2: 97% 96% 97% 100%  Weight:      Height:        General - obese elderly Caucasian lady not in distress  Ophthalmologic - fundi not visualized due to noncooperation.  Cardiovascular - Regular rate and rhythm with no murmur.  Mental Status -  Level of arousal and orientation to time, place, and person were intact. Language including expression, naming, repetition, comprehension was assessed and found intact. Fund of Knowledge was assessed and was intact.  Cranial Nerves II - XII - II - dense left homonymous hemianopia. III, IV, VI - Extraocular movements intact. V - Facial sensation intact bilaterally. VII - left nasolabial fold flattening. VIII - Hearing & vestibular intact bilaterally. X - Palate elevates symmetrically,  no dysarthria. XI - Chin turning & shoulder shrug intact bilaterally. XII - Tongue protrusion intact.  Motor Strength - The patient's strength was normal in RUE and RLE. However, left UE 0/5 and LLE 2+/5 iliopsoas, 2/5 knee flexion, 0/5 distally.  Bulk was normal and fasciculations were absent.   Motor Tone - Muscle tone was assessed at the neck and appendages and was normal.  Reflexes - The patient's reflexes were symmetrical in all extremities and she had left babinski.  Sensory - Light touch, temperature/pinprick were assessed and were decreased on the left, about 50% of right LE and 75% of right UE.  Coordination - The patient had normal movements in the right hands with no ataxia or dysmetria.  Tremor was absent.  Gait and Station - deferred. Discharge Diet   Diet Order            Diet regular Room service appropriate? Yes; Fluid consistency: Thin  Diet effective now             liquids  DISCHARGE PLAN  Disposition:  Transfer to [facility] for ongoing PT, OT and ST  aspirin 81 mg daily for secondary stroke prevention. Resume ASA today 07/19/2018 per Dr Leonie Man.  Recommend ongoing risk factor control by Primary Care Physician at time of discharge from inpatient rehabilitation.  Follow-up [place], Hal, MD in 2 weeks following discharge from rehab.  Follow-up in [facility] in 4 weeks following discharge from rehab, office to schedule an appointment. Outpatient MRI with Contrast.  40 minutes were spent preparing discharge.  Mikey Bussing PA-C [facility] Pager [phone] 07/19/2018, 12:36 PM I have personally examined this patient, reviewed notes, independently viewed imaging studies, participated in medical decision making and plan of care.ROS completed by me personally and pertinent positives fully documented  I have made any additions or clarifications directly to the above note. Agree with note above.     Antony Contras, MD Medical Director [facility] Pager: [phone] 07/19/2018 1:29 PM

## 2018-07-19 NOTE — Progress Notes (Signed)
Physical Therapy Treatment Patient Details Name: [name] MRN: [id number] DOB: 1946/03/05 Today's Date: 07/19/2018    History of Present Illness Pt is a 72 y.o. female PMH including dyslipidemia, GERD, hypertension, obesity, PAF.  Presenting with left sided weakness, headache, and nausea/vomiting. MRI/CT revealed high density hematoma centered in the right parietal lobe.    PT Comments    Patient seen for mobility progression. This session focused on sitting balance and functional transfers. Pt requires max A +2 for sit to stand/stand pivot transfers due to L side hemiplegia. Continue to recommend CIR for further skilled PT services to maximize independence and safety with mobility.    Follow Up Recommendations  CIR;Supervision/Assistance - 24 hour     Equipment Recommendations  Wheelchair (measurements PT);Wheelchair cushion (measurements PT);Hospital bed;3in1 (PT)    Recommendations for Other Services Rehab consult     Precautions / Restrictions Precautions Precautions: Fall Precaution Comments: L visual deficit, L sided weakness Restrictions Weight Bearing Restrictions: No    Mobility  Bed Mobility Overal bed mobility: Needs Assistance Bed Mobility: Rolling;Sidelying to Sit Rolling: Min assist Sidelying to sit: Mod assist       General bed mobility comments: pt able to roll toward L side with use of bed rail; assistance required for L LE to EOB and elevating trunk into sitting  Transfers Overall transfer level: Needs assistance   Transfers: Sit to/from Stand;Stand Pivot Transfers Sit to Stand: +2 physical assistance;Max assist Stand pivot transfers: +2 physical assistance;Max assist       General transfer comment: +2 assist to power up into standing and maintain balance in standing and to pivot toward R side; L LE blocked and faciliation at glutes and trunk for posture   Ambulation/Gait             General Gait Details: unable at this time.     Stairs             Wheelchair Mobility    Modified Rankin (Stroke Patients Only) Modified Rankin (Stroke Patients Only) Pre-Morbid Rankin Score: No symptoms Modified Rankin: Severe disability     Balance Overall balance assessment: Needs assistance Sitting-balance support: Feet supported;Single extremity supported Sitting balance-Leahy Scale: Poor   Postural control: Left lateral lean;Other (comment)(anterior lean) Standing balance support: Bilateral upper extremity supported Standing balance-Leahy Scale: Zero                              Cognition Arousal/Alertness: Awake/alert Behavior During Therapy: WFL for tasks assessed/performed Overall Cognitive Status: Impaired/Different from baseline Area of Impairment: Attention;Following commands;Problem solving                   Current Attention Level: Selective   Following Commands: Follows one step commands consistently     Problem Solving: Difficulty sequencing;Requires verbal cues;Requires tactile cues        Exercises      General Comments        Pertinent Vitals/Pain Pain Assessment: Faces Faces Pain Scale: Hurts a little bit Pain Descriptors / Indicators: Headache Pain Intervention(s): Monitored during session;Premedicated before session;Repositioned    Home Living                      Prior Function            PT Goals (current goals can now be found in the care plan section) Acute Rehab PT Goals PT Goal Formulation: With patient/family Time For  Goal Achievement: 07/28/18 Potential to Achieve Goals: Good Progress towards PT goals: Progressing toward goals    Frequency    Min 4X/week      PT Plan Current plan remains appropriate    Co-evaluation              AM-PAC PT "6 Clicks" Daily Activity  Outcome Measure  Difficulty turning over in bed (including adjusting bedclothes, sheets and blankets)?: Unable Difficulty moving from lying on back  to sitting on the side of the bed? : Unable Difficulty sitting down on and standing up from a chair with arms (e.g., wheelchair, bedside commode, etc,.)?: Unable Help needed moving to and from a bed to chair (including a wheelchair)?: A Lot Help needed walking in hospital room?: Total Help needed climbing 3-5 steps with a railing? : Total 6 Click Score: 7    End of Session Equipment Utilized During Treatment: Gait belt Activity Tolerance: Patient tolerated treatment well Patient left: with call bell/phone within reach;in chair;with chair alarm set Nurse Communication: Mobility status;Need for lift equipment PT Visit Diagnosis: Other abnormalities of gait and mobility (R26.89);Hemiplegia and hemiparesis Hemiplegia - Right/Left: Left Hemiplegia - dominant/non-dominant: Non-dominant Hemiplegia - caused by: Other Nontraumatic intracranial hemorrhage     Time: 2334-3568 PT Time Calculation (min) (ACUTE ONLY): 24 min  Charges:  $Therapeutic Activity: 23-37 mins                     Earney Navy, PTA Pager: [phone]     Darliss Cheney 07/19/2018, 10:32 AM

## 2018-07-19 NOTE — Progress Notes (Signed)
Physical Medicine and Rehabilitation Consult   Reason for Consult: Right parietal hematoma with left sided weakness Referring Physician: Dr. Erlinda Hong   HPI: [name] is a 72 y.o. female with history of hypertension, PAF, obesity, who was admitted on 07/13/2018 with progressive headache, nausea/ vomiting, and left-sided weakness.  CT of head done revealing right parietal lobe hematoma with small local SAH and SDH with local mass-effect.  Dr. Annette Stable felt that  hemorrhage was due to hypertension and recommended monitoring with repeat CT. MRI/MRA brain done 828 and revealed stable hematoma right parietal lobe with stable mass-effect and MRA limited by motion artifact--no large vessel occlusion noted. ASA discontinued and Dr. Erlinda Hong recommends repeating MRI/MRA  brain once ICH resolved to rule out malignancy, aneurysm or AVM.     CT had to be repeated tomorrow.  Patient with resultant dense left hemiplegia and left hemianopsia as well as ongoing headaches.  Serial recommended for follow-up therapy Patient also complaining of numbness in the left arm. Working with physical therapy currently.  Review of Systems  Unable to perform ROS: Medical condition  HENT: Negative for hearing loss.   Respiratory: Negative for shortness of breath.   Cardiovascular: Negative for chest pain.  Gastrointestinal: Positive for heartburn, nausea and vomiting. Negative for abdominal pain.  Neurological: Positive for focal weakness.        Past Medical History:  Diagnosis Date  . Dyslipidemia   . GERD (gastroesophageal reflux disease)   . High risk medication use    on Flecainide since January of 2013  . Hypertension   . Obesity   . PAF (paroxysmal atrial fibrillation) (HCC)    normal stress echo in 2011         Past Surgical History:  Procedure Laterality Date  . +    . ABDOMINAL HYSTERECTOMY    . ANKLE SURGERY    . CARDIOVASCULAR STRESS TEST  07/21/2007   EF 80%, NORMAL  NONDIAGONISTIC FOR ISCHEMIA  . STRESS ECHO TEST  07/25/2010   EF 60%  . WRIST SURGERY     RIGHT WRIST         Family History  Problem Relation Age of Onset  . Alzheimer's disease Mother   . Heart disease Father   . Stroke Sister   . Lung cancer Brother   . Cancer Brother     Social History:  Lives alone. Son lives behind her and she has other local family.  Sedentary but independent without AD prior to admission. Used to work at an [employer]. She reports that she has been smoking--1 PPD. She has never used smokeless tobacco. She reports that she does not drink alcohol or use drugs.         Allergies  Allergen Reactions  . Penicillins     Passed out Has patient had a PCN reaction causing immediate rash, facial/tongue/throat swelling, SOB or lightheadedness with hypotension: Yes Has patient had a PCN reaction causing severe rash involving mucus membranes or skin necrosis: No Has patient had a PCN reaction that required hospitalization: No Has patient had a PCN reaction occurring within the last 10 years: No If all of the above answers are "NO", then may proceed with Cephalosporin use.   . Tape Rash    Medical tape did this          Medications Prior to Admission  Medication Sig Dispense Refill  . acetaminophen (TYLENOL) 500 MG tablet Take 500 mg by mouth every 6 (six) hours as needed for  mild pain.    Marland Kitchen aspirin 81 MG tablet Take 81 mg by mouth every evening.     . cimetidine (TAGAMET) 400 MG tablet Take 200 mg by mouth at bedtime.     . Cyanocobalamin (VITAMIN B 12 PO) Take 1,000 mcg by mouth daily.    . flecainide (TAMBOCOR) 50 MG tablet TAKE 2 TABLETS EVERY MORNING AND 1 TABLET IN THE EVENING (Patient taking differently: Take 50-100 mg by mouth See admin instructions. Take two tablets  (100mg  ) in the AM and one tablet (50mg ) in the evening) 90 tablet 5  . metoprolol tartrate (LOPRESSOR) 25 MG tablet TAKE 1 TABLET BY MOUTH TWICE A DAY  60 tablet 5  . nitroGLYCERIN (NITROSTAT) 0.4 MG SL tablet Place 1 tablet (0.4 mg total) under the tongue every 5 (five) minutes as needed for chest pain (x 3 doses). 25 tablet 3  . Omega-3 Fatty Acids (FISH OIL) 1000 MG CAPS Take 1,000 mg by mouth daily.    . sertraline (ZOLOFT) 100 MG tablet Take 100 mg by mouth daily.     . ciprofloxacin (CIPRO) 500 MG tablet Take 1 tablet (500 mg total) by mouth 2 (two) times daily. (Patient not taking: Reported on 07/13/2018) 14 tablet 0  . HYDROcodone-acetaminophen (NORCO/VICODIN) 5-325 MG per tablet Take 1-2 tablets by mouth every 4 (four) hours as needed for moderate pain or severe pain. (Patient not taking: Reported on 10/21/2017) 20 tablet 0  . ondansetron (ZOFRAN ODT) 4 MG disintegrating tablet Take 1 tablet (4 mg total) by mouth every 8 (eight) hours as needed for nausea or vomiting. (Patient not taking: Reported on 07/13/2018) 20 tablet 0    Home: [place] expects to be discharged to:: Private residence Living Arrangements: Alone Available Help at Discharge: Family, Available 24 hours/day(son, daughter in law, daughter, cousin) Type of Home: House Home Access: Stairs to enter Technical brewer of Steps: 5 Entrance Stairs-Rails: Right, Left, Can reach both Home Layout: One level Bathroom Shower/Tub: Multimedia programmer: Handicapped height Home Equipment: Bedside commode, Environmental consultant - 4 wheels, Grab bars - tub/shower, Hand held shower head, Shower seat - built in  Functional History: Prior Function Level of Independence: Independent Comments: driving, takes care of 18 year old grandson Functional Status:  Mobility: Bed Mobility Overal bed mobility: Needs Assistance Bed Mobility: Rolling, Sidelying to Sit Rolling: Mod assist Sidelying to sit: +2 for physical assistance, Max assist General bed mobility comments: able to assist with rolling, but side to sit heavy lifting for trunk and to get L LE off  bed Transfers General transfer comment: deferred per RN (not yet off bedrest and pt lethargic)  ADL: ADL Overall ADL's : Needs assistance/impaired Eating/Feeding: Moderate assistance Eating/Feeding Details (indicate cue type and reason): for BUE tasks Grooming: Moderate assistance Upper Body Bathing: Maximal assistance Lower Body Bathing: Total assistance Upper Body Dressing : Maximal assistance Lower Body Dressing: Maximal assistance, Bed level Functional mobility during ADLs: (Deferred this session) General ADL Comments: educated Pt and son on protecting LUE during positioning due to decreased sensation  Cognition: Cognition Overall Cognitive Status: Within Functional Limits for tasks assessed Orientation Level: Oriented X4 Awareness: Impaired Awareness Impairment: Intellectual impairment Problem Solving: Impaired Problem Solving Impairment: Functional complex Cognition Arousal/Alertness: Awake/alert Behavior During Therapy: WFL for tasks assessed/performed Overall Cognitive Status: Within Functional Limits for tasks assessed  Blood pressure 131/66, pulse 73, temperature 98.4 F (36.9 C), temperature source Oral, resp. rate 19, height 5\' 6"  (1.676 m), weight 89.9 kg, SpO2 97 %.  Physical Exam  Nursing note and vitals reviewed. Constitutional: She appears well-developed and well-nourished. She has a sickly appearance.  Obese female with damp washcloth of head.  In moderate distress due to nausea with frequent eructations.   HENT:  Head: Normocephalic and atraumatic.  Eyes: Pupils are equal, round, and reactive to light. Conjunctivae are normal.  Neck: Normal range of motion.  Cardiovascular: Normal rate and regular rhythm.  Respiratory: Effort normal and breath sounds normal.  GI: Soft. Bowel sounds are normal.  Neurological: She is alert. Gait abnormal.  Able to answer orientation questions without difficulty. Left hemiparesis noted.  Motor strength is 0/5 in the left  deltoid bicep tricep grip 0/5 in the left hip flexor knee extensor ankle dorsiflexor 4/5 in the right hip flexor knee extensor ankle dorsiflexor 4/5 in the right deltoid bicep tricep grip Sitting balance is poor she leans heavily toward the left side.  She needs cueing to use her right hand on the bed rail to steady herself. Sensation is absent to light touch in the left upper limb in the left lower limb  Psychiatric: She has a normal mood and affect.   Assessment/Plan: Diagnosis: Right parietal intracranial hemorrhage in a patient on aspirin with resultant left hemiparesis and left hemisensory deficits, poor sitting balance and mobility. 1. Does the need for close, 24 hr/day medical supervision in concert with the patient's rehab needs make it unreasonable for this patient to be served in a less intensive setting? Yes 2. Co-Morbidities requiring supervision/potential complications: Essential hypertension, at risk for cerebral edemabladder management, bowel management, safety, skin/wound care, disease management, medication administration, pain management and patient education, does the patient require 24 hr/day rehab nursing? Yes 3. Does the patient require coordinated care of a physician, rehab nurse, PT (1-2 hrs/day, 5 days/week), OT (1-2 hrs/day, 5 days/week) and SLP (.5-1 hrs/day, 5 days/week) to address physical and functional deficits in the context of the above medical diagnosis(es)? Yes Addressing deficits in the following areas: balance, endurance, locomotion, strength, transferring, bowel/bladder control, bathing, dressing, toileting and cognition 4. Can the patient actively participate in an intensive therapy program of at least 3 hrs of therapy per day at least 5 days per week? No 5. The potential for patient to make measurable gains while on inpatient rehab is fair 6. Anticipated functional outcomes upon discharge from inpatient rehab are min assist  with PT, min assist with OT, min  assist with SLP. 7. Estimated rehab length of stay to reach the above functional goals is: 21-25d 8. Anticipated D/C setting: Home 9. Anticipated post D/C treatments: HH therapy and Followed by outpatient 10. Overall Rehab/Functional Prognosis: fair  RECOMMENDATIONS: This patient's condition is appropriate for continued rehabilitative care in the following setting: CIR once neurologic status is stable, repeat CT head is cleared by neurology and hypertension controlled off IV medications Patient has agreed to participate in recommended program. Potentially Note that insurance prior authorization may be required for reimbursement for recommended care.  Comment:  "I have personally performed a face to face diagnostic evaluation of this patient.  Additionally, I have reviewed and concur with the physician assistant's documentation above."  Charlett Blake M.D. [facility] FAAPM&R (Sports Med, Neuromuscular Med) Diplomate Am Board of Electrodiagnostic Med   Bary Leriche, PA-C 07/15/2018        Revision History                   Routing History

## 2018-07-19 NOTE — Progress Notes (Signed)
Inpatient Rehabilitation  Continuing to follow for timing of medical readiness and IP Rehab bed availability.  I have a bed to offer today and have received medical clearance.  Plan to proceed with admission today.  Updated son, Coralyn Mark and nurse case Freight forwarder.  Call if questions.   Carmelia Roller., CCC/SLP Admission Coordinator  [facility]  Cell [phone]

## 2018-07-19 NOTE — H&P (Signed)
Physical Medicine and Rehabilitation Admission H&P    Chief Complaint  Patient presents with  . Functional deficits due to [place].    HPI: [name] is a 72 year old RH- female with history of HTN, PAF, obesity who was admitted on 07/13/2018 with progressive headache, nausea/vomiting and left-sided weakness.  History taken from chart review and patient.  CT of head reviewed, showing right hematoma with SAH/SDH.  Per report, right parietal lobe hematoma with small local SAH and SDH with local mass-effect.  Dr. Trenton Gammon felt that hemorrhage was due to hypertension and recommended monitoring with repeat CT head.  MRI/MRA brain done 8/28 and revealed table hematoma right parietal lobe with stable mass-effect.  MRA limited by motion artifact but no large vessel occlusion noted.  Aspirin discontinued and Dr. Erlinda Hong recommended repeating MRI/MRA brain when McMillin resolves to rule out malignancy, AVM or aneurysm.  Cardene drip was used to control blood pressure.  CT head repeated 8/30 and showed right parietal hematoma to be stable and mild increasing edema and associated mass-effect.  Patient has had issues with headaches which is improving.  Foley was placed due to problems with urinary retention requiring I/O caths.  Low dose ASA resumed today.  Therapy ongoing and patient has had decline in functional status due to resultant left-sided weakness with sensory deficits and left homonymous  hemianopsia.  CIR was recommended for follow-up therapy  Review of Systems  Constitutional: Negative for chills and fever.  HENT: Negative for hearing loss and tinnitus.   Eyes: Negative for blurred vision and double vision.       Left field cut  Respiratory: Negative for cough and shortness of breath.   Cardiovascular: Negative for chest pain and palpitations.  Gastrointestinal: Positive for constipation. Negative for heartburn and nausea.  Genitourinary:       Urinary retention  Musculoskeletal: Negative for back  pain and myalgias.  Skin: Negative for itching and rash.  Neurological: Positive for dizziness, sensory change and focal weakness.  Psychiatric/Behavioral: The patient has insomnia.   All other systems reviewed and are negative.   Past Medical History:  Diagnosis Date  . Dyslipidemia   . GERD (gastroesophageal reflux disease)   . High risk medication use    on Flecainide since January of 2013  . Hypertension   . Obesity   . PAF (paroxysmal atrial fibrillation) (HCC)    normal stress echo in 2011    Past Surgical History:  Procedure Laterality Date  . +    . ABDOMINAL HYSTERECTOMY    . ANKLE SURGERY    . CARDIOVASCULAR STRESS TEST  07/21/2007   EF 80%, NORMAL NONDIAGONISTIC FOR ISCHEMIA  . STRESS ECHO TEST  07/25/2010   EF 60%  . WRIST SURGERY     RIGHT WRIST    Family History  Problem Relation Age of Onset  . Alzheimer's disease Mother   . Heart disease Father   . Stroke Sister   . Lung cancer Brother   . Cancer Brother    Social History:  Lives alone. Son lives behind her and she has other local family.  Sedentary but independent without AD prior to admission. Used to work at an [employer]. She reports that she has been smoking--1 PPD. She has never used smokeless tobacco. She reports that she does not drink alcohol or use drugs.   Allergies  Allergen Reactions  . Penicillins     Passed out Has patient had a PCN reaction causing immediate  rash, facial/tongue/throat swelling, SOB or lightheadedness with hypotension: Yes Has patient had a PCN reaction causing severe rash involving mucus membranes or skin necrosis: No Has patient had a PCN reaction that required hospitalization: No Has patient had a PCN reaction occurring within the last 10 years: No If all of the above answers are "NO", then may proceed with Cephalosporin use.   . Tape Rash    Medical tape did this    Medications Prior to Admission  Medication Sig Dispense Refill  . acetaminophen (TYLENOL)  500 MG tablet Take 500 mg by mouth every 6 (six) hours as needed for mild pain.    Marland Kitchen aspirin 81 MG tablet Take 81 mg by mouth every evening.     . cimetidine (TAGAMET) 400 MG tablet Take 200 mg by mouth at bedtime.     . Cyanocobalamin (VITAMIN B 12 PO) Take 1,000 mcg by mouth daily.    . flecainide (TAMBOCOR) 50 MG tablet TAKE 2 TABLETS EVERY MORNING AND 1 TABLET IN THE EVENING (Patient taking differently: Take 50-100 mg by mouth See admin instructions. Take two tablets  (140m ) in the AM and one tablet (573m in the evening) 90 tablet 5  . metoprolol tartrate (LOPRESSOR) 25 MG tablet TAKE 1 TABLET BY MOUTH TWICE A DAY 60 tablet 5  . nitroGLYCERIN (NITROSTAT) 0.4 MG SL tablet Place 1 tablet (0.4 mg total) under the tongue every 5 (five) minutes as needed for chest pain (x 3 doses). 25 tablet 3  . Omega-3 Fatty Acids (FISH OIL) 1000 MG CAPS Take 1,000 mg by mouth daily.    . sertraline (ZOLOFT) 100 MG tablet Take 100 mg by mouth daily.     . ciprofloxacin (CIPRO) 500 MG tablet Take 1 tablet (500 mg total) by mouth 2 (two) times daily. (Patient not taking: Reported on 07/13/2018) 14 tablet 0  . HYDROcodone-acetaminophen (NORCO/VICODIN) 5-325 MG per tablet Take 1-2 tablets by mouth every 4 (four) hours as needed for moderate pain or severe pain. (Patient not taking: Reported on 10/21/2017) 20 tablet 0  . ondansetron (ZOFRAN ODT) 4 MG disintegrating tablet Take 1 tablet (4 mg total) by mouth every 8 (eight) hours as needed for nausea or vomiting. (Patient not taking: Reported on 07/13/2018) 20 tablet 0    Drug Regimen Review  Drug regimen was reviewed and remains appropriate with no significant issues identified  Home: Home Living Family/patient expects to be discharged to:: Private residence Living Arrangements: Alone Available Help at Discharge: Family, Available 24 hours/day Type of Home: House Home Access: Stairs to enter EnCenterPoint Energyf Steps: 5 Entrance Stairs-Rails: Right, Left,  Can reach both Home Layout: One level Bathroom Shower/Tub: WaMultimedia programmerHandicapped height Bathroom Accessibility: Yes Home Equipment: Bedside commode, WaEnvironmental consultant 4 wheels, Grab bars - tub/shower, Hand held shower head, Shower seat - built in  Lives With: Alone   Functional History: Prior Function Level of Independence: Independent Comments: driving, takes care of 8 23ear old grandson  Functional Status:  Mobility: Bed Mobility Overal bed mobility: Needs Assistance Bed Mobility: Rolling, Sidelying to Sit Rolling: Min assist Sidelying to sit: Mod assist General bed mobility comments: pt able to roll toward L side with use of bed rail; assistance required for L LE to EOB and elevating trunk into sitting Transfers Overall transfer level: Needs assistance Equipment used: None Transfer via Lift Equipment: MaLucent Technologiesransfers: Sit to/from Stand, StTechnical brewerransfers Sit to Stand: +2 physical assistance, Max assist Stand pivot transfers: +2 physical  assistance, Max assist General transfer comment: +2 assist to power up into standing and maintain balance in standing and to pivot toward R side; L LE blocked and faciliation at glutes and trunk for posture  Ambulation/Gait General Gait Details: unable at this time.     ADL: ADL Overall ADL's : Needs assistance/impaired Eating/Feeding: Set up Eating/Feeding Details (indicate cue type and reason): pt with chips placed on left side and required to scan to the L to locate the chips. pt states "where did she put my chips" pt again oriented to placement pt able to recall and find chips the second attempt Grooming: Moderate assistance Upper Body Bathing: Maximal assistance Lower Body Bathing: Total assistance Upper Body Dressing : Maximal assistance Lower Body Dressing: Maximal assistance Toilet Transfer: +2 for safety/equipment, Maximal assistance, +2 for physical assistance Toilet Transfer Details (indicate cue type and  reason): simulated EOB to chair  Functional mobility during ADLs: (Deferred this session) General ADL Comments: pt attempting OOB to chair with stand pivot this session. pt requires cues and tactile input at EOB to work on static sitting balance. pt able to complete transfer this session with mod cues  Cognition: Cognition Overall Cognitive Status: Impaired/Different from baseline Orientation Level: Oriented X4 Awareness: Impaired Awareness Impairment: Intellectual impairment Problem Solving: Impaired Problem Solving Impairment: Functional complex Cognition Arousal/Alertness: Awake/alert Behavior During Therapy: WFL for tasks assessed/performed Overall Cognitive Status: Impaired/Different from baseline Area of Impairment: Attention, Following commands, Problem solving Current Attention Level: Selective Following Commands: Follows one step commands consistently Awareness: Emergent Problem Solving: Difficulty sequencing, Requires verbal cues, Requires tactile cues General Comments: pt attempting a search and find task on table. pt using a bright yellow piece of paper . pt required min cues but able to correctly locate the answer   Blood pressure 130/69, pulse (!) 58, temperature 98 F (36.7 C), temperature source Oral, resp. rate 18, height '5\' 6"'  (1.676 m), weight 89.9 kg, SpO2 100 %. Physical Exam  Nursing note and vitals reviewed. Constitutional: She appears well-developed and well-nourished.  HENT:  Head: Normocephalic and atraumatic.  Eyes: EOM are normal. Right eye exhibits no discharge. Left eye exhibits no discharge.  Neck: Normal range of motion. Neck supple.  Cardiovascular: Normal rate and regular rhythm.  Respiratory: Effort normal and breath sounds normal.  GI: Soft. Bowel sounds are normal.  Genitourinary:  Genitourinary Comments: Foley in place.   Musculoskeletal: She exhibits no edema or tenderness.  Neurological: She is alert.  Left facial weakness.  Speech  clear.  Left sided weakness with sensory deficits.  Motor: RUE/RLE: 5/5 proximal to distal LUE: 0/5 proximal to distal LLE: 1/5 proximal to distal Sensation diminished to light left foot  Skin: Skin is warm and dry.  Psychiatric: She has a normal mood and affect. Her behavior is normal.   Results for orders placed or performed during the hospital encounter of 07/13/18 (from the past 48 hour(s))  CBC     Status: None   Collection Time: 07/18/18  5:52 AM  Result Value Ref Range   WBC 9.2 4.0 - 10.5 K/uL   RBC 4.65 3.87 - 5.11 MIL/uL   Hemoglobin 13.8 12.0 - 15.0 g/dL   HCT 41.4 36.0 - 46.0 %   MCV 89.0 78.0 - 100.0 fL   MCH 29.7 26.0 - 34.0 pg   MCHC 33.3 30.0 - 36.0 g/dL   RDW 13.0 11.5 - 15.5 %   Platelets 219 150 - 400 K/uL    Comment: Performed at [facility], [place] [address], [place], [place] [address]  Basic metabolic panel     Status: Abnormal   Collection Time: 07/18/18  5:52 AM  Result Value Ref Range   Sodium 139 135 - 145 mmol/L   Potassium 3.7 3.5 - 5.1 mmol/L   Chloride 105 98 - 111 mmol/L   CO2 28 22 - 32 mmol/L   Glucose, Bld 110 (H) 70 - 99 mg/dL   BUN 17 8 - 23 mg/dL   Creatinine, Ser 0.79 0.44 - 1.00 mg/dL   Calcium 9.5 8.9 - 10.3 mg/dL   GFR calc non Af Amer >60 >60 mL/min   GFR calc Af Amer >60 >60 mL/min    Comment: (NOTE) The eGFR has been calculated using the CKD EPI equation. This calculation has not been validated in all clinical situations. eGFR's persistently <60 mL/min signify possible Chronic Kidney Disease.    Anion gap 6 5 - 15    Comment: Performed at [facility] [address], [place], [place] [address]   No results found.     Medical Problem List and Plan: 1. Decline in functional status due to resultant left-sided weakness with sensory deficits and left homonymous  hemianopsia secondary to large Right occipitoparietal ICH 2.  DVT Prophylaxis/Anticoagulation: Mechanical: Sequential compression devices, below knee  Bilateral lower extremities 3. Pain Management: Continue Tylenol as needed for headaches 4. Mood: LCSW to follow for evaluation and support 5. Neuropsych: This patient is capable of making decisions on her own behalf. 6. Skin/Wound Care: Routine pressure relief measures 7. Fluids/Electrolytes/Nutrition: Monitor I's and O's.  Check lites in a.m. 8. HTN: Monitor blood pressure twice daily 9.  PAF: Monitor heart rate twice daily-- appears in NSR on Tambocor and metoprolol twice daily. 10. Constipation: MiraLAX today.  May need to increase senna to twice daily 11.  Urinary retention: Remove Foley today and start voiding trial.  Monitor voiding with PVR checks. Will check UA/ UC    Post Admission Physician Evaluation: 1. Preadmission assessment reviewed and changes made below. 2. Functional deficits secondary  to large Right occipitoparietal ICH. 3. Patient is admitted to receive collaborative, interdisciplinary care between the physiatrist, rehab nursing staff, and therapy team. 4. Patient's level of medical complexity and substantial therapy needs in context of that medical necessity cannot be provided at a lesser intensity of care such as a SNF. 5. Patient has experienced substantial functional loss from his/her baseline which was documented above under the "Functional History" and "Functional Status" headings.  Judging by the patient's diagnosis, physical exam, and functional history, the patient has potential for functional progress which will result in measurable gains while on inpatient rehab.  These gains will be of substantial and practical use upon discharge  in facilitating mobility and self-care at the household level. 52. Physiatrist will provide 24 hour management of medical needs as well as oversight of the therapy plan/treatment and provide guidance as appropriate regarding the interaction of the two. 7. 24 hour rehab nursing will assist with safety, disease management and patient  education  and help integrate therapy concepts, techniques,education, etc. 8. PT will assess and treat for/with: Lower extremity strength, range of motion, stamina, balance, functional mobility, safety, adaptive techniques and equipment, coping skills, pain control, education. Goals are: Min A. 9. OT will assess and treat for/with: ADL's, functional mobility, safety, upper extremity strength, adaptive techniques and equipment, ego support, and community reintegration.   Goals are: Min A. Therapy may proceed with showering this patient.  10. Case Management and Social Worker will assess and treat for psychological issues and discharge planning. 11. Team conference will be held weekly to assess progress toward goals and to determine barriers to discharge. 12. Patient will receive at least 3 hours of therapy per day at least 5 days per week. 13. ELOS: 19-24 days.       14. Prognosis:  good  I have personally performed a face to face diagnostic evaluation, including, but not limited to relevant history and physical exam findings, of this patient and developed relevant assessment and plan.  Additionally, I have reviewed and concur with the physician assistant's documentation above.   The patient's status has not changed. The original post admission physician evaluation remains appropriate, and any changes from the pre-admission screening or documentation from the acute chart are noted above.   Delice Lesch, MD, ABPMR Bary Leriche, PA-C 07/19/2018

## 2018-07-19 NOTE — Care Management Note (Signed)
Case Management Note  Patient Details  Name: [name] MRN: [id number] Date of Birth: 30-Sep-1946  Subjective/Objective:                    Action/Plan: Pt discharging to CIR today. CM signing off.  Expected Discharge Date:                  Expected Discharge Plan:  [place]  In-House Referral:     Discharge planning Services  CM Consult  Post Acute Care Choice:    Choice offered to:     DME Arranged:    DME Agency:     HH Arranged:    [place] Agency:     Status of Service:  Completed, signed off  If discussed at [employer] of Stay Meetings, dates discussed:    Additional Comments:  Pollie Friar, RN 07/19/2018, 11:15 AM

## 2018-07-19 NOTE — Progress Notes (Signed)
Pt complaining of itching on shoulder and back. No rash per RN Given Benadryl 12.5mg  IV x2.  Stroke team to continue to follow.  Amie Portland MD

## 2018-07-20 ENCOUNTER — Inpatient Hospital Stay (HOSPITAL_COMMUNITY): Payer: Medicare Other | Admitting: Speech Pathology

## 2018-07-20 ENCOUNTER — Inpatient Hospital Stay (HOSPITAL_COMMUNITY): Payer: Medicare Other | Admitting: Occupational Therapy

## 2018-07-20 DIAGNOSIS — I611 Nontraumatic intracerebral hemorrhage in hemisphere, cortical: Secondary | ICD-10-CM

## 2018-07-20 DIAGNOSIS — G8194 Hemiplegia, unspecified affecting left nondominant side: Secondary | ICD-10-CM

## 2018-07-20 DIAGNOSIS — R414 Neurologic neglect syndrome: Secondary | ICD-10-CM

## 2018-07-20 DIAGNOSIS — I10 Essential (primary) hypertension: Secondary | ICD-10-CM

## 2018-07-20 LAB — CBC WITH DIFFERENTIAL/PLATELET
ABS IMMATURE GRANULOCYTES: 0.2 10*3/uL — AB (ref 0.0–0.1)
Basophils Absolute: 0.1 10*3/uL (ref 0.0–0.1)
Basophils Relative: 1 %
Eosinophils Absolute: 0.5 10*3/uL (ref 0.0–0.7)
Eosinophils Relative: 5 %
HEMATOCRIT: 42.8 % (ref 36.0–46.0)
Hemoglobin: 14.2 g/dL (ref 12.0–15.0)
IMMATURE GRANULOCYTES: 2 %
LYMPHS ABS: 1.5 10*3/uL (ref 0.7–4.0)
Lymphocytes Relative: 14 %
MCH: 29.5 pg (ref 26.0–34.0)
MCHC: 33.2 g/dL (ref 30.0–36.0)
MCV: 89 fL (ref 78.0–100.0)
MONOS PCT: 10 %
Monocytes Absolute: 1 10*3/uL (ref 0.1–1.0)
NEUTROS ABS: 7.4 10*3/uL (ref 1.7–7.7)
NEUTROS PCT: 68 %
PLATELETS: 231 10*3/uL (ref 150–400)
RBC: 4.81 MIL/uL (ref 3.87–5.11)
RDW: 12.7 % (ref 11.5–15.5)
WBC: 10.8 10*3/uL — ABNORMAL HIGH (ref 4.0–10.5)

## 2018-07-20 LAB — COMPREHENSIVE METABOLIC PANEL
ALBUMIN: 3.2 g/dL — AB (ref 3.5–5.0)
ALT: 59 U/L — AB (ref 0–44)
ANION GAP: 10 (ref 5–15)
AST: 34 U/L (ref 15–41)
Alkaline Phosphatase: 114 U/L (ref 38–126)
BUN: 21 mg/dL (ref 8–23)
CHLORIDE: 102 mmol/L (ref 98–111)
CO2: 24 mmol/L (ref 22–32)
Calcium: 9.6 mg/dL (ref 8.9–10.3)
Creatinine, Ser: 0.9 mg/dL (ref 0.44–1.00)
GFR calc non Af Amer: >60 mL/min (ref >60)
GLUCOSE: 99 mg/dL (ref 70–99)
Potassium: 4.3 mmol/L (ref 3.5–5.1)
SODIUM: 136 mmol/L (ref 135–145)
Total Bilirubin: 0.6 mg/dL (ref 0.3–1.2)
Total Protein: 5.8 g/dL — ABNORMAL LOW (ref 6.5–8.1)

## 2018-07-20 MED ORDER — METOPROLOL TARTRATE 25 MG PO TABS
25.0000 mg | ORAL_TABLET | Freq: Two times a day (BID) | ORAL | Status: DC
  Administered 2018-07-20 – 2018-07-22 (×6): 25 mg via ORAL
  Filled 2018-07-20 (×6): qty 1

## 2018-07-20 NOTE — Progress Notes (Signed)
Subjective/Complaints: Pt slept ok and has no c/os this am  ROS:  Denies CP, SOB, N/V/D   Objective: Vital Signs: Blood pressure (!) 141/53, pulse (!) 52, temperature 98.1 F (36.7 C), temperature source Oral, resp. rate 18, height '5\' 6"'  (1.676 m), weight 91.9 kg, SpO2 94 %. No results found. Results for orders placed or performed during the hospital encounter of 07/13/18 (from the past 72 hour(s))  CBC     Status: None   Collection Time: 07/18/18  5:52 AM  Result Value Ref Range   WBC 9.2 4.0 - 10.5 K/uL   RBC 4.65 3.87 - 5.11 MIL/uL   Hemoglobin 13.8 12.0 - 15.0 g/dL   HCT 41.4 36.0 - 46.0 %   MCV 89.0 78.0 - 100.0 fL   MCH 29.7 26.0 - 34.0 pg   MCHC 33.3 30.0 - 36.0 g/dL   RDW 13.0 11.5 - 15.5 %   Platelets 219 150 - 400 K/uL    Comment: Performed at [facility] [address], [place], [place] [address]  Basic metabolic panel     Status: Abnormal   Collection Time: 07/18/18  5:52 AM  Result Value Ref Range   Sodium 139 135 - 145 mmol/L   Potassium 3.7 3.5 - 5.1 mmol/L   Chloride 105 98 - 111 mmol/L   CO2 28 22 - 32 mmol/L   Glucose, Bld 110 (H) 70 - 99 mg/dL   BUN 17 8 - 23 mg/dL   Creatinine, Ser 0.79 0.44 - 1.00 mg/dL   Calcium 9.5 8.9 - 10.3 mg/dL   GFR calc non Af Amer >60 >60 mL/min   GFR calc Af Amer >60 >60 mL/min    Comment: (NOTE) The eGFR has been calculated using the CKD EPI equation. This calculation has not been validated in all clinical situations. eGFR's persistently <60 mL/min signify possible Chronic Kidney Disease.    Anion gap 6 5 - 15    Comment: Performed at [facility] [address], [place], [place] [address]     HEENT: normal Cardio: RRR and no murmur Resp: CTA B/L and unlabored GI: BS positive and NT, ND Extremity:  Pulses positive and No Edema Skin:   Intact Neuro: Flat, Abnormal Sensory absent sensation Left hand intact LT sensaiton Left foot , Abnormal Motor 0/5 in LUE, trace Left hip add LLE, normal strength on  the R side, Normal FMC and Inattention Musc/Skel:  Other no pain with UE or LE ROM Gen NAD   Assessment/Plan: 1. 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 Physiatrist is providing close team supervision and 24 hour management of active medical problems listed below. Physiatrist and rehab team continue to assess barriers to discharge/monitor patient progress toward functional and medical goals. FIM:                   Function - Comprehension Comprehension: Auditory Comprehension assist level: Understands basic 75 - 89% of the time/ requires cueing 10 - 24% of the time  Function - Expression Expression: Verbal Expression assist level: Expresses basic 90% of the time/requires cueing < 10% of the time.  Function - Social Interaction Social Interaction assist level: Interacts appropriately with others - No medications needed.  Function - Problem Solving Problem solving assist level: Solves basic 90% of the time/requires cueing < 10% of the time  Function - Memory Memory  assist level: Recognizes or recalls 90% of the time/requires cueing < 10% of the time Patient normally able to recall (first 3 days only): Current season, Staff names and faces, That he or she is in a hospital  Medical Problem List and Plan: 1. Decline in functional status due to resultant left-sided weakness with sensory deficits and left homonymous  hemianopsia secondary to large Right occipitoparietal ICH Initial evals today 2.  DVT Prophylaxis/Anticoagulation: Mechanical: Sequential compression devices, below knee Bilateral lower extremities 3. Pain Management: Continue Tylenol as needed for headaches 4. Mood: LCSW to follow for evaluation and support 5. Neuropsych: This patient is capable of making decisions on her own behalf. 6. Skin/Wound Care: Routine  pressure relief measures 7. Fluids/Electrolytes/Nutrition: Monitor I's and O's.  Check lites in a.m. 8. HTN: Monitor blood pressure twice daily Vitals:   07/19/18 2122 07/20/18 0515  BP:  (!) 141/53  Pulse: (!) 58 (!) 52  Resp:  18  Temp:  98.1 F (36.7 C)  SpO2:  94%  9.  PAF: Monitor heart rate twice daily-- appears in NSR on Tambocor and metoprolol twice daily.Loletha Grayer cardia will reduce metoprolol and monitor 10. Constipation: MiraLAX today.  May need to increase senna to twice daily 11.  Urinary retention: Remove Foley today and start voiding trial.  Monitor voiding with PVR checks. Will check UA/ UC  Had UTI 8/28 LOS (Days) 1 A FACE TO FACE EVALUATION WAS PERFORMED  Charlett Blake 07/20/2018, 6:35 AM

## 2018-07-20 NOTE — Discharge Instructions (Signed)
Inpatient Rehab Discharge Instructions  [name] Discharge date and time:    Activities/Precautions/ Functional Status: Activity: no lifting, driving, or strenuous exercise  till cleared by MD Diet: cardiac diet Wound Care: none needed   Functional status:  ___ No restrictions     ___ Walk up steps independently ___ 24/7 supervision/assistance   ___ Walk up steps with assistance ___ Intermittent supervision/assistance  ___ Bathe/dress independently ___ Walk with walker     ___ Bathe/dress with assistance ___ Walk Independently    ___ Shower independently ___ Walk with assistance    ___ Shower with assistance ___ No alcohol     ___ Return to work/school ________   Special Instructions:   STROKE/TIA DISCHARGE INSTRUCTIONS SMOKING Cigarette smoking nearly doubles your risk of having a stroke & is the single most alterable risk factor  If you smoke or have smoked in the last 12 months, you are advised to quit smoking for your health.  Most of the excess cardiovascular risk related to smoking disappears within a year of stopping.  Ask you doctor about anti-smoking medications  [place] Quit Line: 1-800-QUIT NOW  Free Smoking Cessation Classes [phone]  CHOLESTEROL Know your levels; limit fat & cholesterol in your diet  Lipid Panel     Component Value Date/Time   CHOL 97 07/14/2018 0353   TRIG 110 07/14/2018 0353   HDL 26 (L) 07/14/2018 0353   CHOLHDL 3.7 07/14/2018 0353   VLDL 22 07/14/2018 0353   LDLCALC 49 07/14/2018 0353      Many patients benefit from treatment even if their cholesterol is at goal.  Goal: Total Cholesterol (CHOL) less than 160  Goal:  Triglycerides (TRIG) less than 150  Goal:  HDL greater than 40  Goal:  LDL (LDLCALC) less than 100   BLOOD PRESSURE American Stroke Association blood pressure target is less that 120/80 mm/Hg  Your discharge blood pressure is:  BP: (!) 153/70  Monitor your blood pressure  Limit your salt and alcohol  intake  Many individuals will require more than one medication for high blood pressure  DIABETES (A1c is a blood sugar average for last 3 months) Goal HGBA1c is under 7% (HBGA1c is blood sugar average for last 3 months)  Diabetes: No known diagnosis of diabetes    Lab Results  Component Value Date   HGBA1C 5.6 07/14/2018     Your HGBA1c can be lowered with medications, healthy diet, and exercise.  Check your blood sugar as directed by your physician  Call your physician if you experience unexplained or low blood sugars.  PHYSICAL ACTIVITY/REHABILITATION Goal is 30 minutes at least 4 days per week  Activity: No driving, Therapies: see above Return to work: N/A  Activity decreases your risk of heart attack and stroke and makes your heart stronger.  It helps control your weight and blood pressure; helps you relax and can improve your mood.  Participate in a regular exercise program.  Talk with your doctor about the best form of exercise for you (dancing, walking, swimming, cycling).  DIET/WEIGHT Goal is to maintain a healthy weight  Your discharge diet is:  Diet Order            Diet Heart Room service appropriate? Yes; Fluid consistency: Thin  Diet effective now             liquids Your height is:  Height: 5\' 6"  (167.6 cm) Your current weight is: Weight: 91.9 kg Your Body Mass Index (BMI) is:  BMI (Calculated): 32.7  Following the type of diet specifically designed for you will help prevent another stroke.  Your goal weight is: 155 lbs  Your goal Body Mass Index (BMI) is 19-24.  Healthy food habits can help reduce 3 risk factors for stroke:  High cholesterol, hypertension, and excess weight.  RESOURCES Stroke/Support Group:  Call [phone]   STROKE EDUCATION PROVIDED/REVIEWED AND GIVEN TO PATIENT Stroke warning signs and symptoms How to activate emergency medical system (call 911). Medications prescribed at discharge. Need for follow-up after discharge. Personal  risk factors for stroke. Pneumonia vaccine given:  Flu vaccine given:  My questions have been answered, the writing is legible, and I understand these instructions.  I will adhere to these goals & educational materials that have been provided to me after my discharge from the hospital.    My questions have been answered and I understand these instructions. I will adhere to these goals and the provided educational materials after my discharge from the hospital.  Patient/Caregiver Signature _______________________________ Date __________  Clinician Signature _______________________________________ Date __________  Please bring this form and your medication list with you to all your follow-up doctor's appointments.

## 2018-07-20 NOTE — Evaluation (Signed)
Speech Language Pathology Assessment and Plan  Patient Details  Name: [name] MRN: [id number] Date of Birth: 1946-07-08  SLP Diagnosis: Cognitive Impairments  Rehab Potential: Excellent ELOS: 24-28 days     Today's Date: 07/20/2018 SLP Individual Time: 1100-1200 SLP Individual Time Calculation (min): 60 min   Problem List:  Patient Active Problem List   Diagnosis Date Noted  . Intracerebral hemorrhage ([place]) 07/19/2018  . Acute CVA (cerebrovascular accident) ([place])   . Benign essential HTN   . Urinary retention   . Constipation   . ICH (intracerebral hemorrhage) ([place]) 07/13/2018  . Hypertension 07/06/2012  . Paroxysmal atrial fibrillation ([place]) 11/27/2011  . DIARRHEA, ACUTE 05/31/2009  . GERD 03/06/2009  . BACK PAIN 03/06/2009  . ANXIETY 06/12/2007  . DEPRESSION 06/12/2007  . HEADACHE 06/12/2007   Past Medical History:  Past Medical History:  Diagnosis Date  . Dyslipidemia   . GERD (gastroesophageal reflux disease)   . High risk medication use    on Flecainide since January of 2013  . Hypertension   . Obesity   . PAF (paroxysmal atrial fibrillation) ([facility])    normal stress echo in 2011   Past Surgical History:  Past Surgical History:  Procedure Laterality Date  . +    . ABDOMINAL HYSTERECTOMY    . ANKLE SURGERY    . CARDIOVASCULAR STRESS TEST  07/21/2007   EF 80%, NORMAL NONDIAGONISTIC FOR ISCHEMIA  . STRESS ECHO TEST  07/25/2010   EF 60%  . WRIST SURGERY     RIGHT WRIST    Assessment / Plan / Recommendation Clinical Impression Patient is a 72 y.o. year old female, RH- female with history of HTN, PAF, obesity who was admitted on 07/13/2018 with progressive headache, nausea/vomiting and left-sided weakness.  History taken from chart review and patient.  CT of head reviewed, showing right hematoma with SAH/SDH.  Per report, right parietal lobe hematoma with small local SAH and SDH with local mass-effect.  Dr. Trenton Gammon felt that hemorrhage was due to  hypertension and recommended monitoring with repeat CT head.  MRI/MRA brain done 8/28 and revealed table hematoma right parietal lobe with stable mass-effect.  MRA limited by motion artifact but no large vessel occlusion noted.  Aspirin discontinued and Dr. Erlinda Hong recommended repeating MRI/MRA brain when [place] resolves to rule out malignancy, AVM or aneurysm.  Cardene drip was used to control blood pressure. CT head repeated 8/30 and showed right parietal hematoma to be stable and mild increasing edema and associated mass-effect.  Patient has had issues with headaches which is improving.  Foley was placed due to problems with urinary retention requiring I/O caths.  Low dose ASA resumed today.  Therapy ongoing and patient has had decline in functional status due to resultant left-sided weakness with sensory deficits and left homonymous  hemianopsia.  CIR was recommended for follow-up therapy.  Patient transferred to CIR on 07/19/2018.  Patient administered the MoCA (version 7.3) and scored 24/30 points with a score of 26 or above considered normal. Patient demonstrates mild cognitive impairments impacting attention to left field of environment, short-term recall, executive functioning and emergent awareness which impacts her safety with functional and familiar tasks. Patient would benefit from skilled SLP intervention to maximize her cognitive functioning and overall functional independence prior to discharge.    Skilled Therapeutic Interventions          Administered a cognitive-linguistic evaluation, please see above for details. SLP facilitated session by providing Min A verbal cues for problem solving during  tray set-up and for visual scanning to left field of environment to locate items in left visual field. Educated patient in regards to current cognitive deficits and goals of skilled SLP intervention. She verbalized understanding and agreement.    SLP Assessment  Patient will need skilled [facility] during CIR admission    Recommendations  Oral Care Recommendations: Oral care BID Recommendations for Other Services: Neuropsych consult Patient destination: Home Follow up Recommendations: None Equipment Recommended: None recommended by SLP    SLP Frequency 3 to 5 out of 7 days   SLP Duration  SLP Intensity  SLP Treatment/Interventions 24-28 days   Minumum of 1-2 x/day, 30 to 90 minutes  Cognitive remediation/compensation;Environmental controls;Internal/external aids;Therapeutic Activities;Patient/family education;Cueing hierarchy;Functional tasks    Pain No/Denies Pain   Prior Functioning Type of Home: House  Lives With: Alone Available Help at Discharge: Family;Available 24 hours/day(per report family (children and cousin) will be able to provide 24/7) 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  Social Interaction Social Interaction assist level: Interacts appropriately with others with medication or extra time (anti-anxiety, antidepressant).  Problem Solving Problem solving assist level: Solves basic 75 - 89% of the time/requires cueing 10 - 24% of the time  Memory Memory assist level: Recognizes or recalls 90% of the time/requires cueing < 10% of the time   Short Term Goals: Week 1: SLP Short Term Goal 1 (Week 1): Patient will demonstrate functional problem solving for mildly complex tasks with supervision verbal cues. SLP Short Term Goal 2 (Week 1): Patient will attend to/locate items in left field of enviornment during functional tasks with Min A verbal  cues.  SLP Short Term Goal 3 (Week 1): Patient will recall new, daily information with Mod I.  SLP Short Term Goal 4 (Week 1): Patient will self-monitor and correct errors during functional tasks with supervision verbal cues.   Refer to Care Plan for Long Term Goals  Recommendations for other services: Neuropsych  Discharge Criteria: Patient will be discharged from SLP if patient refuses treatment 3 consecutive times without medical reason, if treatment goals not met, if there is a change in medical status, if patient makes no progress towards goals or if patient is discharged from hospital.  The above assessment, treatment plan, treatment alternatives and goals were discussed and mutually agreed upon: by patient  Irie Fiorello 07/20/2018, 3:12 PM

## 2018-07-20 NOTE — Evaluation (Addendum)
Physical Therapy Assessment and Plan  Patient Details  Name: [name] MRN: [id number] Date of Birth: 07-Jul-1946  PT Diagnosis: Abnormal posture, Abnormality of gait, Coordination disorder, Difficulty walking, Hemiparesis non-dominant and Muscle weakness Rehab Potential: Good ELOS: 3-4 weeks   Today's Date: 07/20/2018 PT Individual Time: 0905-1016 PT Individual Time Calculation (min): 71 min    Problem List:  Patient Active Problem List   Diagnosis Date Noted  . Intracerebral hemorrhage ([place]) 07/19/2018  . Acute CVA (cerebrovascular accident) ([place])   . Benign essential HTN   . Urinary retention   . Constipation   . ICH (intracerebral hemorrhage) ([place]) 07/13/2018  . Hypertension 07/06/2012  . Paroxysmal atrial fibrillation ([place]) 11/27/2011  . DIARRHEA, ACUTE 05/31/2009  . GERD 03/06/2009  . BACK PAIN 03/06/2009  . ANXIETY 06/12/2007  . DEPRESSION 06/12/2007  . HEADACHE 06/12/2007    Past Medical History:  Past Medical History:  Diagnosis Date  . Dyslipidemia   . GERD (gastroesophageal reflux disease)   . High risk medication use    on Flecainide since January of 2013  . Hypertension   . Obesity   . PAF (paroxysmal atrial fibrillation) ([facility])    normal stress echo in 2011   Past Surgical History:  Past Surgical History:  Procedure Laterality Date  . +    . ABDOMINAL HYSTERECTOMY    . ANKLE SURGERY    . CARDIOVASCULAR STRESS TEST  07/21/2007   EF 80%, NORMAL NONDIAGONISTIC FOR ISCHEMIA  . STRESS ECHO TEST  07/25/2010   EF 60%  . WRIST SURGERY     RIGHT WRIST    Assessment & Plan Clinical Impression: Patient is a 72 y.o. year old female, RH- female with history of HTN, PAF, obesity who was admitted on 07/13/2018 with progressive headache, nausea/vomiting and left-sided weakness.  History taken from chart review and patient.  CT of head reviewed, showing right hematoma with SAH/SDH.  Per report, right parietal lobe hematoma with small local SAH and SDH with  local mass-effect.  Dr. Trenton Gammon felt that hemorrhage was due to hypertension and recommended monitoring with repeat CT head.  MRI/MRA brain done 8/28 and revealed table hematoma right parietal lobe with stable mass-effect.  MRA limited by motion artifact but no large vessel occlusion noted.  Aspirin discontinued and Dr. Erlinda Hong recommended repeating MRI/MRA brain when [place] resolves to rule out malignancy, AVM or aneurysm.  Cardene drip was used to control blood pressure.  CT head repeated 8/30 and showed right parietal hematoma to be stable and mild increasing edema and associated mass-effect.  Patient has had issues with headaches which is improving.  Foley was placed due to problems with urinary retention requiring I/O caths.  Low dose ASA resumed today.  Therapy ongoing and patient has had decline in functional status due to resultant left-sided weakness with sensory deficits and left homonymous  hemianopsia.  CIR was recommended for follow-up therapy.  Patient transferred to CIR on 07/19/2018 .   Patient currently requires +2 assist with mobility secondary to muscle weakness, decreased cardiorespiratoy endurance, decreased coordination, decreased visual perceptual skills, decreased attention to left, decreased problem solving and decreased safety awareness, and decreased sitting balance, decreased standing balance, decreased postural control, hemiplegia and decreased balance strategies.  Prior to hospitalization, patient was independent  with mobility and lived with Alone in a House home.  Home access is 6(4" rise)Stairs to enter.  Patient will benefit from skilled PT intervention to maximize safe functional mobility, minimize fall risk and decrease caregiver burden  for planned discharge home with 24 hour assist.  Anticipate patient will benefit from follow up [place] at discharge.  PT - End of Session Activity Tolerance: Decreased this session Endurance Deficit: Yes Endurance Deficit Description: 2/2 generalized  deconditioning PT Assessment Rehab Potential (ACUTE/IP ONLY): Good PT Barriers to Discharge: Inaccessible home environment;Home environment access/layout;Lack of/limited family support PT Barriers to Discharge Comments: pt has multiple steps to enter house & may need ramp, unsure if family can provide 24 hr supervision that is recommended PT Patient demonstrates impairments in the following area(s): Balance;Behavior;Perception;Endurance;Safety;Motor;Sensory PT Transfers Functional Problem(s): Car;Bed to Chair;Bed Mobility;Furniture PT Locomotion Functional Problem(s): Ambulation;Wheelchair Mobility;Stairs PT Plan PT Intensity: Minimum of 1-2 x/day ,45 to 90 minutes PT Frequency: 5 out of 7 days PT Duration Estimated Length of Stay: 3-4 weeks PT Treatment/Interventions: Ambulation/gait training;Cognitive remediation/compensation;Discharge planning;DME/adaptive equipment instruction;Functional mobility training;Pain management;Psychosocial support;Splinting/orthotics;Therapeutic Activities;UE/LE Strength taining/ROM;Visual/perceptual remediation/compensation;Wheelchair propulsion/positioning;UE/LE Coordination activities;Therapeutic Exercise;Stair training;Patient/family education;Neuromuscular re-education;Functional electrical stimulation;Disease management/prevention;Community reintegration;Balance/vestibular training PT Transfers Anticipated Outcome(s): min<>mod assist overall PT Locomotion Anticipated Outcome(s): supervision w/c mobility, min assist gait with PT, mod assist stair negotiation PT Recommendation Recommendations for Other Services: Neuropsych consult;Therapeutic Recreation consult Therapeutic Recreation Interventions: Stress management Follow Up Recommendations: Home health PT;24 hour supervision/assistance Patient destination: Home Equipment Recommended: To be determined  Skilled Therapeutic Intervention Patient received in bed with RN & son present in room & son exiting  shortly after PT arrival. Therapist educated pt & son on daily therapy schedule, weekly interdisciplinary team meetings, safety plan (need for staff only to assist pt out of bed/chair) and other CIR information with them voicing understanding. Pt completes supine>sit with max assist with cuing for technique and squat pivot transfers to all surfaces with +2 assist with multimodal cuing for anterior weight shifting, head/hips relationship, and manual facilitation for weight shifting. Pt demonstrates significant L inattention throughout session, requiring max cuing to correct. Pt attempts squat pivot to simulated small SUV height but unable to sufficiently get buttocks on seat therefore simulated height lowered but pt continues to require +2 assist. Provided pt with w/c cushion to prevent skin breakdown when sitting in w/c. Pt ambulates with 3 muskateers style & negotiates 1 step (3") with +2 assist (see below for details). Pt is able to bear weight through LLE during gait without any buckling noted but does require assistance for advancement of LLE. Transitioned to sitting EOM to address midline orientation and static/dynamic sitting balance with pt reaching outside of BOS in all directions and returning to midline. Pt requires ongoing multimodal cuing for equal weight bearing thorough B hips in sitting, vs leaning to left. Pt notes fatigue by end of session. At end of session pt left sitting in w/c in room with call bell in reach & LCSW in room.   PT Evaluation Precautions/Restrictions Precautions Precautions: Fall Precaution Comments: L hemi, L inattention Restrictions Weight Bearing Restrictions: No  General Chart Reviewed: Yes Additional Pertinent History: dyslipiemia, HTN, GERD, obesity, PAF Response to Previous Treatment: Not applicable Family/Caregiver Present: (son present at beginning of session & exited shortly after)  Vital Signs BP = 153/70 mmHg HR = 55 bpm  Pain Denied c/o  pain  Home Living/Prior Functioning Home Living Living Arrangements: Alone Available Help at Discharge: Family;Available 24 hours/day(son reports family will provide 24 hr supervision at d/c) Type of Home: House Home Access: Stairs to enter [employer] of Steps: 6(4" rise) Entrance Stairs-Rails: Right;Left;Can reach both Home Layout: One level  Lives With: Alone Prior Function Level of Independence: Independent with basic  ADLs;Independent with homemaking with ambulation;Independent with gait;Independent with transfers  Able to Take Stairs?: Yes Driving: Yes 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 Perception Perception: Impaired Inattention/Neglect: Does not attend to left visual field;Does not attend to left side of body   Cognition Overall Cognitive Status: Impaired/Different from baseline Orientation Level: Oriented X4 Problem Solving: Impaired Safety/Judgment: Impaired  Sensation Sensation Light Touch: Not tested Proprioception: Not tested Coordination Gross Motor Movements are Fluid and Coordinated: No  Motor  Motor Motor: Abnormal tone;Abnormal postural alignment and control Motor - Skilled Clinical Observations: generalized deconditioning   Mobility Bed Mobility Bed Mobility: Supine to Sit Supine to Sit: Maximal Assistance - Patient - Patient 25-49% Transfers Transfers: Lateral/Scoot Geophysicist/field seismologist Transfers;Sit to Stand Sit to Stand: Maximal Assistance - Patient 25-49% Squat Pivot Transfers: 2 Helpers  Locomotion  Gait Ambulation: Yes Gait Assistance: 2 Helpers Gait Distance (Feet): 20 Feet Assistive device: (3 muskateers) Gait Assistance Details: Manual facilitation for weight shifting;Manual facilitation for placement;Verbal cues for technique;Verbal cues for sequencing Gait Gait: Yes Gait Pattern: (requires tactile cuing for L foot advancement with assistance to advance  LLE & weight shifting L<>R, decreased weight shifting, decreased step length, decreased stride length) Stairs / Additional Locomotion Stairs: Yes Stairs Assistance: 2 Helpers Stair Management Technique: One rail Right Number of Stairs: 1 Height of Stairs: 3(inches) Product manager Mobility: Yes Wheelchair Assistance: Moderate Assistance - Patient 50 - 74% Wheelchair Propulsion: Right upper extremity;Right lower extremity Wheelchair Parts Management: Needs assistance Distance: 100 ft   Trunk/Postural Assessment  Lumbar Assessment Lumbar Assessment: Exceptions to WFL(posterior pelvic tilt) Postural Control Postural Control: Deficits on evaluation Righting Reactions: impaired Protective Responses:  Impaired   Balance Requires min/mod assist for sitting balance and cuing for midline orientation  Extremity Assessment  RUE Assessment RUE Assessment: Within Functional Limits LUE Assessment LUE Assessment: Exceptions to [facility] LUE Body System: Neuro(increased tone noted) RLE Assessment RLE Assessment: Within Functional Limits LLE Assessment LLE Assessment: Exceptions to WFL(flexor tone LLE) General Strength Comments: able to weight bear without buckling noted   See Function Navigator for Current Functional Status.   Refer to Care Plan for Long Term Goals  Recommendations for other services: Neuropsych and Therapeutic Recreation  Stress management  Discharge Criteria: Patient will be discharged from PT if patient refuses treatment 3 consecutive times without medical reason, if treatment goals not met, if there is a change in medical status, if patient makes no progress towards goals or if patient is discharged from hospital.  The above assessment, treatment plan, treatment alternatives and goals were discussed and mutually agreed upon: by patient  [name] 07/20/2018, 3:58 PM

## 2018-07-20 NOTE — Evaluation (Signed)
Occupational Therapy Assessment and Plan  Patient Details  Name: [name] MRN: [id number] Date of Birth: [birth year]  OT Diagnosis: abnormal posture, acute pain, cognitive deficits, disturbance of vision, flaccid hemiplegia and hemiparesis, hemiplegia affecting non-dominant side and muscle weakness (generalized) Rehab Potential: Rehab Potential (ACUTE ONLY): Fair ELOS: 24-28 days   Today's Date: 07/20/2018 OT Individual Time: 1333-1430 OT Individual Time Calculation (min): 57 min     Problem List:  Patient Active Problem List   Diagnosis Date Noted  . Intracerebral hemorrhage ([place]) 07/19/2018  . Acute CVA (cerebrovascular accident) ([place])   . Benign essential HTN   . Urinary retention   . Constipation   . ICH (intracerebral hemorrhage) ([place]) 07/13/2018  . Hypertension 07/06/2012  . Paroxysmal atrial fibrillation ([place]) 11/27/2011  . DIARRHEA, ACUTE 05/31/2009  . GERD 03/06/2009  . BACK PAIN 03/06/2009  . ANXIETY 06/12/2007  . DEPRESSION 06/12/2007  . HEADACHE 06/12/2007    Past Medical History:  Past Medical History:  Diagnosis Date  . Dyslipidemia   . GERD (gastroesophageal reflux disease)   . High risk medication use    on Flecainide since January of 2013  . Hypertension   . Obesity   . PAF (paroxysmal atrial fibrillation) (H[facility])    normal stress echo in 2011   Past Surgical History:  Past Surgical History:  Procedure Laterality Date  . +    . ABDOMINAL HYSTERECTOMY    . ANKLE SURGERY    . CARDIOVASCULAR STRESS TEST  07/21/2007   EF 80%, NORMAL NONDIAGONISTIC FOR ISCHEMIA  . STRESS ECHO TEST  07/25/2010   EF 60%  . WRIST SURGERY     RIGHT WRIST    Assessment & Plan Clinical Impression: Patient is a 72 y.o. right handed female with history of HTN, PAF, obesity who was admitted on 07/13/2018 with progressive headache, nausea/vomiting and left-sided weakness.  History taken from chart review and patient.  CT of head reviewed, showing right hematoma with  SAH/SDH.  Per report, right parietal lobe hematoma with small local SAH and SDH with local mass-effect.  Dr. Trenton Gammon felt that hemorrhage was due to hypertension and recommended monitoring with repeat CT head.  MRI/MRA brain done 8/28 and revealed table hematoma right parietal lobe with stable mass-effect.  MRA limited by motion artifact but no large vessel occlusion noted.  Aspirin discontinued and Dr. Erlinda Hong recommended repeating MRI/MRA brain when [place] resolves to rule out malignancy, AVM or aneurysm.  Cardene drip was used to control blood pressure.  CT head repeated 8/30 and showed right parietal hematoma to be stable and mild increasing edema and associated mass-effect.  Patient has had issues with headaches which is improving.  Foley was placed due to problems with urinary retention requiring I/O caths.  Low dose ASA resumed today.  Therapy ongoing and patient has had decline in functional status due to resultant left-sided weakness with sensory deficits and left homonymous  hemianopsia.  CIR was recommended for follow-up therapy.   Patient transferred to CIR on 07/19/2018 .    Patient currently requires total with basic self-care skills secondary to muscle weakness, impaired timing and sequencing, abnormal tone and unbalanced muscle activation, decreased visual perceptual skills and hemianopsia, decreased attention to left, decreased awareness, decreased problem solving, decreased safety awareness and decreased memory and decreased sitting balance, decreased standing balance, decreased postural control, hemiplegia and decreased balance strategies.  Prior to hospitalization, patient could complete ADLs with independent .  Patient will benefit from skilled intervention to decrease level of  assist with basic self-care skills prior to discharge home with care partner.  Anticipate patient will require 24 hour supervision and minimal physical assistance and follow up home health.  OT - End of Session Activity  Tolerance: Tolerates 30+ min activity with multiple rests Endurance Deficit: Yes Endurance Deficit Description: 2/2 generalized deconditioning OT Assessment Rehab Potential (ACUTE ONLY): Fair OT Barriers to Discharge: Home environment access/layout OT Patient demonstrates impairments in the following area(s): Balance;Endurance;Motor;Cognition;Pain;Perception;Safety;Sensory;Skin Integrity;Vision OT Basic ADL's Functional Problem(s): Eating;Grooming;Bathing;Dressing;Toileting OT Transfers Functional Problem(s): Toilet;Tub/Shower OT Additional Impairment(s): Fuctional Use of Upper Extremity OT Plan OT Intensity: Minimum of 1-2 x/day, 45 to 90 minutes OT Duration/Estimated Length of Stay: 24-28 days OT Treatment/Interventions: Balance/vestibular training;Cognitive remediation/compensation;Discharge planning;Disease mangement/prevention;DME/adaptive equipment instruction;Functional electrical stimulation;Functional mobility training;Neuromuscular re-education;Pain management;Patient/family education;Psychosocial support;Self Care/advanced ADL retraining;Skin care/wound managment;Splinting/orthotics;Therapeutic Activities;Therapeutic Exercise;UE/LE Strength taining/ROM;UE/LE Coordination activities;Visual/perceptual remediation/compensation;Wheelchair propulsion/positioning OT Self Feeding Anticipated Outcome(s): Supervision/setup OT Basic Self-Care Anticipated Outcome(s): Min assist OT Toileting Anticipated Outcome(s): Mod assist OT Bathroom Transfers Anticipated Outcome(s): Min-mod assist OT Recommendation Recommendations for Other Services: Neuropsych consult Patient destination: Home Follow Up Recommendations: Home health OT;24 hour supervision/assistance Equipment Recommended: 3 in 1 bedside comode   Skilled Therapeutic Intervention OT eval completed with discussion of rehab process, OT purpose, POC, ELOS, and goals.  ADL assessment completed with bathing seated at EOB.  +2 for safety during  dynamic sitting due to lean to Lt with decreased awareness.  Unable to complete LB bathing/dressing at sit > stand level even with +2 due to dense hemiplegia and body habitus.  Pt demonstrating Lt inattention to body and environment, during visual assessment pt with Lt homonymous hemianopsia but able to turn head to Lt with cues to locate items.  Pt reports pain in RUE with shoulder flexion > 120*.  Pt fatigued throughout session requesting to return to supine during session.    OT Evaluation Precautions/Restrictions  Precautions Precautions: Fall Precaution Comments: L hemi, L inattention/field cut Restrictions Weight Bearing Restrictions: No General   Vital Signs Therapy Vitals Pulse Rate: (!) 51 Resp: 18 BP: (!) 150/108 Patient Position (if appropriate): Lying Oxygen Therapy SpO2: 98 % Pain Pain Assessment Pain Scale: 0-10 Pain Score: 3  Pain Type: Acute pain Pain Location: Head Pain Descriptors / Indicators: Headache Pain Onset: On-going Pain Intervention(s): (pre-medicated) Home Living/Prior Functioning Home Living Family/patient expects to be discharged to:: Private residence Living Arrangements: Alone Available Help at Discharge: Family, Available 24 hours/day(per report family (children and cousin) will be able to provide 24/7) Type of Home: House Home Access: Stairs to enter Technical brewer of Steps: 6(4" rise) Entrance Stairs-Rails: Right, Left, Can reach both Home Layout: One level Bathroom Shower/Tub: Walk-in shower, Door(has built in seat, grab bars, and hand held shower head) Bathroom Toilet: Handicapped height Bathroom Accessibility: Yes  Lives With: Alone IADL History Homemaking Responsibilities: Yes Meal Prep Responsibility: Primary(simple meals, sandwiches or microwave meals) Laundry Responsibility: Primary Cleaning Responsibility: Primary Bill Paying/Finance Responsibility: Primary Shopping Responsibility: Primary Prior Function Level of  Independence: Independent with basic ADLs, Independent with homemaking with ambulation, Independent with gait, Independent with transfers  Able to Take Stairs?: Yes Driving: Yes Vocation: (cared for 34 y/o grandson) Leisure: Hobbies-yes (Comment)(adult coloring books) ADL  See Function Navigator Vision Baseline Vision/History: Wears glasses Wears Glasses: At all times Patient Visual Report: Peripheral vision impairment Vision Assessment?: Yes;Vision impaired- to be further tested in functional context Eye Alignment: Within Functional Limits Ocular Range of Motion: Within Functional Limits Alignment/Gaze Preference: Gaze right Tracking/Visual Pursuits: Decreased smoothness of horizontal tracking;Requires  cues, head turns, or add eye shifts to track;Unable to hold eye position out of midline Saccades: Undershoots;Decreased speed of saccadic movement;Additional eye shifts occurred during testing(increased difficulty when scanning to Lt) Visual Fields: Left homonymous hemianopsia Additional Comments: with confrontation testing, pt unable to identify stimulus in Lt visual field Perception  Perception: Impaired Inattention/Neglect: Does not attend to left visual field;Does not attend to left side of body Praxis   Cognition Overall Cognitive Status: Impaired/Different from baseline Arousal/Alertness: Lethargic Orientation Level: Place;Person;Situation Person: Oriented Place: Oriented Situation: Oriented Year: 2019 Month: September Day of Week: Correct Memory: Impaired Memory Impairment: Decreased recall of new information Immediate Memory Recall: Sock;Blue;Bed Memory Recall: Sock;Blue Memory Recall Sock: Without Cue Memory Recall Blue: Without Cue Attention: Sustained;Selective Sustained Attention: Appears intact Selective Attention: Appears intact Awareness: Impaired Awareness Impairment: Emergent impairment Problem Solving: Impaired Problem Solving Impairment: Functional  complex Safety/Judgment: Impaired Sensation Sensation Light Touch: Impaired by gross assessment(mildly impaired on LUE) Proprioception: Impaired Detail Proprioception Impaired Details: Impaired LUE;Impaired LLE Coordination Gross Motor Movements are Fluid and Coordinated: No Fine Motor Movements are Fluid and Coordinated: No Coordination and Movement Description: flaccid LUE Finger Nose Finger Test: mild difficulty with Rt when in Left field of vision, unable to complete Lt due to flaccid Motor  Motor Motor: Abnormal tone;Abnormal postural alignment and control Motor - Skilled Clinical Observations: generalized deconditioning Mobility  Bed Mobility Bed Mobility: Supine to Sit Supine to Sit: Maximal Assistance - Patient - Patient 25-49% Transfers Sit to Stand: Maximal Assistance - Patient 25-49%  Trunk/Postural Assessment  Lumbar Assessment Lumbar Assessment: Exceptions to WFL(posterior pelvic tilt) Postural Control Postural Control: Deficits on evaluation Righting Reactions: impaired Protective Responses:  impaired  Balance Balance Balance Assessed: Yes Extremity/Trunk Assessment RUE Assessment RUE Assessment: Within Functional Limits LUE Assessment LUE Assessment: Exceptions to [facility] Passive Range of Motion (PROM) Comments: WNL, pain in shoulder with flexion >120* General Strength Comments: flaccid LUE Body System: Neuro Brunstrum levels for arm and hand: Arm;Hand Brunstrum level for arm: Stage I Presynergy Brunstrum level for hand: Stage I Flaccidity   See Function Navigator for Current Functional Status.   Refer to Care Plan for Long Term Goals  Recommendations for other services: Neuropsych   Discharge Criteria: Patient will be discharged from OT if patient refuses treatment 3 consecutive times without medical reason, if treatment goals not met, if there is a change in medical status, if patient makes no progress towards goals or if patient is discharged from  hospital.  The above assessment, treatment plan, treatment alternatives and goals were discussed and mutually agreed upon: by patient  [name] 07/20/2018, 4:04 PM

## 2018-07-20 NOTE — Care Management (Signed)
Inpatient [facility] Individual Statement of Services  Patient Name:  [name]  Date:  07/20/2018  Welcome to the [facility].  Our goal is to provide you with an individualized program based on your diagnosis and situation, designed to meet your specific needs.  With this comprehensive rehabilitation program, you will be expected to participate in at least 3 hours of rehabilitation therapies Monday-Friday, with modified therapy programming on the weekends.  Your rehabilitation program will include the following services:  Physical Therapy (PT), Occupational Therapy (OT), Speech Therapy (ST), 24 hour per day rehabilitation nursing, Case Management (Social Worker), Rehabilitation Medicine, Nutrition Services and Pharmacy Services  Weekly team conferences will be held on Wednesday to discuss your progress.  Your Social Worker will talk with you frequently to get your input and to update you on team discussions.  Team conferences with you and your family in attendance may also be held.  Expected length of stay: 24-28 days  Overall anticipated outcome: min level of assist  Depending on your progress and recovery, your program may change. Your Social Worker will coordinate services and will keep you informed of any changes. Your Social Worker's name and contact numbers are listed  below.  The following services may also be recommended but are not provided by the [facility] will be made to provide these services after discharge if needed.  Arrangements include referral to agencies that provide these services.  Your insurance has been verified to be:  Medicare & Cigna Your primary doctor is:  Radio broadcast assistant  Pertinent information will be shared with your doctor and your insurance company.  Social Worker:  Ovidio Kin, [place] or ([phone]  Information discussed with and copy given to patient by: Elease Hashimoto, 07/20/2018, 10:31 AM

## 2018-07-20 NOTE — Progress Notes (Signed)
Social Work  Social Work Assessment and Plan  Patient Details  Name: [name] MRN: [id number] Date of Birth: [birth year]  Today's Date: 07/20/2018  Problem List:  Patient Active Problem List   Diagnosis Date Noted  . Intracerebral hemorrhage ([place]) 07/19/2018  . Acute CVA (cerebrovascular accident) ([place])   . Benign essential HTN   . Urinary retention   . Constipation   . ICH (intracerebral hemorrhage) ([place]) 07/13/2018  . Hypertension 07/06/2012  . Paroxysmal atrial fibrillation ([place]) 11/27/2011  . DIARRHEA, ACUTE 05/31/2009  . GERD 03/06/2009  . BACK PAIN 03/06/2009  . ANXIETY 06/12/2007  . DEPRESSION 06/12/2007  . HEADACHE 06/12/2007   Past Medical History:  Past Medical History:  Diagnosis Date  . Dyslipidemia   . GERD (gastroesophageal reflux disease)   . High risk medication use    on Flecainide since January of 2013  . Hypertension   . Obesity   . PAF (paroxysmal atrial fibrillation) ([facility])    normal stress echo in 2011   Past Surgical History:  Past Surgical History:  Procedure Laterality Date  . +    . ABDOMINAL HYSTERECTOMY    . ANKLE SURGERY    . CARDIOVASCULAR STRESS TEST  07/21/2007   EF 80%, NORMAL NONDIAGONISTIC FOR ISCHEMIA  . STRESS ECHO TEST  07/25/2010   EF 60%  . WRIST SURGERY     RIGHT WRIST   Social History:  reports that she has been smoking. She has never used smokeless tobacco. She reports that she does not drink alcohol or use drugs.  Family / Support Systems Marital Status: Widow/Widower How Long?: 2018 Patient Roles: Parent, Caregiver Children: Terry-son [phone]   Honaker Buskirk-daughter [phone] Other Supports: Film/video editor: son, daughter and other family members Ability/Limitations of Caregiver: Children both work but will need to work on a plan for 24 hr care Caregiver Availability: Other (Comment)(Family will need to come up with a plan) Family Dynamics: Close knit family both children are  local-actually son lives right behind pt. She has friends, extended family and church members who are supportive and visit her daily  Social History Preferred language: English Religion: Quaker Cultural Background: No issues Education: Naval architect classes Read: Yes Write: Yes Employment Status: Retired Freight forwarder Issues: No issues Guardian/Conservator: None-according to MD pt is capable of making her own decisions while here. Pt wants her son included if any decisions need to be made while here.   Abuse/Neglect Abuse/Neglect Assessment Can Be Completed: Yes Physical Abuse: Denies Verbal Abuse: Denies Sexual Abuse: Denies Exploitation of patient/patient's resources: Denies Self-Neglect: Denies  Emotional Status Pt's affect, behavior adn adjustment status: Pt is motivated to do well and recover from this stroke. She was independent and had adjusted to being alone after husband died last year. She was caring for her grandson which she enjoyed and had fun doing. He is in school now Recent Psychosocial Issues: health issues she thought she was doing well with them until this Pyschiatric History: No history has some grief issues over the loss of her husband but was adjusting and getting used to being alone. May benefit from seeing neuro-psych while here due to CVA and recent loss of husband. Substance Abuse History: Tobacco aware needs to stop smoking due to health issues. Plans on quitting aware of the community resources available to do this. Will continue to address while here  Patient / Family Perceptions, Expectations & Goals Pt/Family understanding of illness & functional limitations: Pt and son can explain her  stroke, both have spoken with the MD's and feel they have a good understanding of her deficits. Both are hopeful she will do well here and recover as much as she can here. Premorbid pt/family roles/activities: Mother, widow, grandmother, retiree,  caregiver, church member, etc Anticipated changes in roles/activities/participation: resume Pt/family expectations/goals: Pt states: " I need to get moving and back to taking care of myself, I know my insurance doesn't cover someone to stay with me."  Son states: " We are hopeful and will need to come up with a plan."  [employer]: None Premorbid Home Care/DME Agencies: Other (Comment)(has rollator, tub seat, bsc from husband) Transportation available at discharge: Family members Resource referrals recommended: Neuropsychology, Support group (specify)  Discharge Planning Living Arrangements: Alone Support Systems: Children, Other relatives, Friends/neighbors, Social worker community Type of Residence: Private residence Insurance Resources: [employer], Multimedia programmer (specify)(Cigna) Financial Resources: Radio broadcast assistant Screen Referred: No Living Expenses: Own Money Management: Patient Does the patient have any problems obtaining your medications?: No Home Management: Self Patient/Family Preliminary Plans: Son is coming up with a plan. Pt did live on her own and was completely independent and driving prior to this stroke. She took care of her 2 yo grandson over the summer. Will await therapy team evaluations and work on discharge needs. Social Work Anticipated Follow Up Needs: HH/OP, Support Group  Clinical Impression Pleasant female who was very independent prior to this stroke and wants to get back to this level. She is motivated to do well and knows she has a ways to go. Her two children are involved and are trying to come up with a plan for discharge, aware she will need 24 hr assist. Would benefit from seeing neuro-psych while here due to CVA and grief issues from husband's recent death. Will await therapy goals and work on discharge plan.  Elease Hashimoto 07/20/2018, 10:46 AM

## 2018-07-20 NOTE — Progress Notes (Signed)
Patient information reviewed and entered into eRehab system by Meagan Ancona, RN, CRRN, PPS Coordinator.  Information including medical coding and functional independence measure will be reviewed and updated through discharge.     Per nursing patient was given "Data Collection Information Summary for Patients in Inpatient Rehabilitation Facilities with attached "Privacy Act Statement-Health Care Records" upon admission.  

## 2018-07-21 ENCOUNTER — Inpatient Hospital Stay (HOSPITAL_COMMUNITY): Payer: Medicare Other | Admitting: Occupational Therapy

## 2018-07-21 ENCOUNTER — Inpatient Hospital Stay (HOSPITAL_COMMUNITY): Payer: Medicare Other | Admitting: Physical Therapy

## 2018-07-21 ENCOUNTER — Inpatient Hospital Stay (HOSPITAL_COMMUNITY): Payer: Medicare Other | Admitting: Speech Pathology

## 2018-07-21 ENCOUNTER — Encounter (HOSPITAL_COMMUNITY): Payer: Medicare Other | Admitting: Psychology

## 2018-07-21 DIAGNOSIS — F320 Major depressive disorder, single episode, mild: Secondary | ICD-10-CM

## 2018-07-21 MED ORDER — GABAPENTIN 100 MG PO CAPS
100.0000 mg | ORAL_CAPSULE | Freq: Every day | ORAL | Status: DC
  Administered 2018-07-21 – 2018-08-15 (×26): 100 mg via ORAL
  Filled 2018-07-21 (×26): qty 1

## 2018-07-21 MED ORDER — NITROFURANTOIN MACROCRYSTAL 50 MG PO CAPS
50.0000 mg | ORAL_CAPSULE | Freq: Four times a day (QID) | ORAL | Status: DC
  Administered 2018-07-21 – 2018-07-22 (×4): 50 mg via ORAL
  Filled 2018-07-21 (×5): qty 1

## 2018-07-21 NOTE — Progress Notes (Signed)
Subjective/Complaints: Distractible per OT, +2 transfers, increasing tone LLE, max A amb 20' BP dropped with standing   ROS:  Denies CP, SOB, N/V/D   Objective: Vital Signs: Blood pressure (!) 141/49, pulse (!) 50, temperature 97.9 F (36.6 C), temperature source Oral, resp. rate 18, height '5\' 6"'  (1.676 m), weight 88.9 kg, SpO2 97 %. No results found. Results for orders placed or performed during the hospital encounter of 07/19/18 (from the past 72 hour(s))  Urine culture     Status: Abnormal (Preliminary result)   Collection Time: 07/19/18 11:30 PM  Result Value Ref Range   Specimen Description URINE, CATHETERIZED    Special Requests NONE    Culture (A)     >=100,000 COLONIES/mL ESCHERICHIA COLI SUSCEPTIBILITIES TO FOLLOW Performed at [facility] [address], [place], [place] [address]    Report Status PENDING   Comprehensive metabolic panel     Status: Abnormal   Collection Time: 07/20/18  6:13 AM  Result Value Ref Range   Sodium 136 135 - 145 mmol/L   Potassium 4.3 3.5 - 5.1 mmol/L   Chloride 102 98 - 111 mmol/L   CO2 24 22 - 32 mmol/L   Glucose, Bld 99 70 - 99 mg/dL   BUN 21 8 - 23 mg/dL   Creatinine, Ser 0.90 0.44 - 1.00 mg/dL   Calcium 9.6 8.9 - 10.3 mg/dL   Total Protein 5.8 (L) 6.5 - 8.1 g/dL   Albumin 3.2 (L) 3.5 - 5.0 g/dL   AST 34 15 - 41 U/L   ALT 59 (H) 0 - 44 U/L   Alkaline Phosphatase 114 38 - 126 U/L   Total Bilirubin 0.6 0.3 - 1.2 mg/dL   GFR calc non Af Amer >60 >60 mL/min   GFR calc Af Amer >60 >60 mL/min    Comment: (NOTE) The eGFR has been calculated using the CKD EPI equation. This calculation has not been validated in all clinical situations. eGFR's persistently <60 mL/min signify possible Chronic Kidney Disease.    Anion gap 10 5 - 15    Comment: Performed at [facility] [address], [place], [place] [address]  CBC WITH DIFFERENTIAL     Status: Abnormal   Collection Time: 07/20/18  6:13 AM  Result Value Ref Range   WBC 10.8 (H) 4.0 - 10.5 K/uL   RBC 4.81 3.87 - 5.11 MIL/uL   Hemoglobin 14.2 12.0 - 15.0 g/dL   HCT 42.8 36.0 - 46.0 %   MCV 89.0 78.0 - 100.0 fL   MCH 29.5 26.0 - 34.0 pg   MCHC 33.2 30.0 - 36.0 g/dL   RDW 12.7 11.5 - 15.5 %   Platelets 231 150 - 400 K/uL   Neutrophils Relative % 68 %   Neutro Abs 7.4 1.7 - 7.7 K/uL   Lymphocytes Relative 14 %   Lymphs Abs 1.5 0.7 - 4.0 K/uL   Monocytes Relative 10 %   Monocytes Absolute 1.0 0.1 - 1.0 K/uL   Eosinophils Relative 5 %   Eosinophils Absolute 0.5 0.0 - 0.7 K/uL   Basophils Relative 1 %   Basophils Absolute 0.1 0.0 - 0.1 K/uL   Immature Granulocytes 2 %   Abs Immature Granulocytes 0.2 (H) 0.0 - 0.1 K/uL    Comment: Performed at [facility] [address], [place], [place] [address]     HEENT: normal Cardio: RRR and no murmur Resp: CTA B/L and unlabored GI: BS positive and NT, ND  Extremity:  Pulses positive and No Edema Skin:   Intact Neuro: Flat, Abnormal Sensory absent sensation Left hand intact LT sensaiton Left foot , Abnormal Motor 0/5 in LUE, trace Left hip add LLE, normal strength on the R side, Normal FMC and Inattention Musc/Skel:  Other no pain with UE or LE ROM Gen NAD   Assessment/Plan: 1. 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 Physiatrist is providing close team supervision and 24 hour management of active medical problems listed below. Physiatrist and rehab team continue to assess barriers to discharge/monitor patient progress toward functional and medical goals. FIM: Function - Bathing Position: Sitting EOB Body parts bathed by patient: Chest, Abdomen Body parts bathed by helper: Left arm, Right arm, Back Assist Level: 2 helpers  Function- Upper Body Dressing/Undressing What is the patient wearing?: Pull over shirt/dress Pull over shirt/dress - Perfomed  by patient: Thread/unthread right sleeve, Put head through opening Pull over shirt/dress - Perfomed by helper: Thread/unthread left sleeve, Pull shirt over trunk Assist Level: 2 helpers Function - Lower Body Dressing/Undressing Position: Bed Assist for lower body dressing: 2 Helpers  Function - Toileting Toileting activity did not occur: Safety/medical concerns  Function - Air cabin crew transfer activity did not occur: Safety/medical concerns  Function - Chair/bed transfer Chair/bed transfer method: Squat pivot Chair/bed transfer assist level: 2 helpers Chair/bed transfer assistive device: Armrests Mechanical lift: Maximove Chair/bed transfer details: Manual facilitation for placement, Manual facilitation for weight shifting, Verbal cues for technique, Verbal cues for sequencing, Tactile cues for sequencing, Tactile cues for weight shifting, Tactile cues for posture, Tactile cues for placement  Function - Locomotion: Wheelchair Type: Manual Max wheelchair distance: 100 ft Assist Level: Moderate assistance (Pt 50 - 74%) Assist Level: Moderate assistance (Pt 50 - 74%) Wheel 150 feet activity did not occur: Safety/medical concerns Turns around,maneuvers to table,bed, and toilet,negotiates 3% grade,maneuvers on rugs and over doorsills: No Function - Locomotion: Ambulation Assistive device: Rail in hallway Max distance: 20 ft  Assist level: 2 helpers(max assist + w/c follow) Assist level: 2 helpers Walk 50 feet with 2 turns activity did not occur: Safety/medical concerns Walk 150 feet activity did not occur: Safety/medical concerns Walk 10 feet on uneven surfaces activity did not occur: Safety/medical concerns  Function - Comprehension Comprehension: Auditory Comprehension assist level: Understands basic 75 - 89% of the time/ requires cueing 10 - 24% of the time  Function - Expression Expression: Verbal Expression assist level: Expresses basic 90% of the time/requires  cueing < 10% of the time.  Function - Social Interaction Social Interaction assist level: Interacts appropriately with others with medication or extra time (anti-anxiety, antidepressant).  Function - Problem Solving Problem solving assist level: Solves basic 75 - 89% of the time/requires cueing 10 - 24% of the time  Function - Memory Memory assist level: Recognizes or recalls 90% of the time/requires cueing < 10% of the time Patient normally able to recall (first 3 days only): Current season, Staff names and faces, That he or she is in a hospital, Location of own room  Medical Problem List and Plan: 1. Decline in functional status due to resultant left-sided weakness with sensory deficits and left homonymous  hemianopsia secondary to large Right occipitoparietal [place] Team conference today please see physician documentation under team conference tab, met with team face-to-face to discuss problems,progress, and goals. Formulized individual treatment plan based on medical history, underlying  problem and comorbidities. 2.  DVT Prophylaxis/Anticoagulation: Mechanical: Sequential compression devices, below knee Bilateral lower extremities 3. Pain Management: Continue Tylenol as needed for headaches 4. Mood: LCSW to follow for evaluation and support 5. Neuropsych: This patient is capable of making decisions on her own behalf. 6. Skin/Wound Care: Routine pressure relief measures 7. Fluids/Electrolytes/Nutrition: Monitor I's and O's.  Check lites in a.m. 8. HTN: Monitor blood pressure twice daily Vitals:   07/20/18 2109 07/21/18 0557  BP: (!) 152/53 (!) 141/49  Pulse: 69 (!) 50  Resp: 18 18  Temp: 98.2 F (36.8 C) 97.9 F (36.6 C)  SpO2: 95% 97%  9.  PAF: Monitor heart rate twice daily-- appears in NSR on Tambocor and metoprolol twice daily.Bradycardia will reduce metoprolol and monitor 10. Constipation: MiraLAX today.  May need to increase senna to twice daily 11.  Urinary retention: Remove  Foley today and start voiding trial.  Monitor voiding with PVR checks. Will check UA/ UC  Had UTI 8/28- ecoli LOS (Days) 2 A FACE TO FACE EVALUATION WAS PERFORMED  Charlett Blake 07/21/2018, 10:44 AM

## 2018-07-21 NOTE — Progress Notes (Signed)
Occupational Therapy Session Note  Patient Details  Name: [name] MRN: [id number] Date of Birth: 01-29-1946  Today's Date: 07/21/2018 OT Individual Time: 0092-3300 OT Individual Time Calculation (min): 60 min    Short Term Goals: Week 1:  OT Short Term Goal 1 (Week 1): Pt will complete LB bathing with max assist OT Short Term Goal 2 (Week 1): Pt will complete toilet transfers with max assist of 1 caregiver OT Short Term Goal 3 (Week 1): Pt will don pants with max assist of 1 caregiver  Skilled Therapeutic Interventions/Progress Updates:    Treatment session with focus on ADL retraining with sit > stand and functional transfers.  Pt received supine in bed reporting fatigue but wanting to get to sink to brush teeth.  Completed bed mobility with max assist and cues to attend to LUE during rolling.  Completed sit > stand with use of Stedy with +2 for safety and positioning of LUE due to flaccid UE and pain in shoulder.  Max cues to visually scan to Lt of sink to locate personal toothbrush.  Engaged in grooming, bathing, and dressing at sink with mod multimodal cues for sitting balance and postural control.  Pt reports need to toilet.  Completed transfer to [facility] with use of Stedy with +2.  Pt with Lt lean in sitting on BSC and when standing requiring cues to correct posture, pt with no awareness of Lt lean.  Pt reporting dizziness when seated on toilet, BP 92/71.  Pt with no void or BM while on toilet.  Returned to w/c with use of Stedy and left with towel roll to promote improved midline sitting and LUE on half lap tray for support.   Therapy Documentation Precautions:  Precautions Precautions: Fall Precaution Comments: L hemi, L inattention/field cut Restrictions Weight Bearing Restrictions: No Pain: Pain Assessment Pain Scale: 0-10 Pain Score: 4  Pain Type: Acute pain Pain Location: Head Pain Orientation: Upper Pain Descriptors / Indicators: Headache Pain Frequency:  Intermittent Pain Onset: Gradual Patients Stated Pain Goal: 1 Pain Intervention(s): Medication (See eMAR)  See Function Navigator for Current Functional Status.   Therapy/Group: Individual Therapy  [name] 07/21/2018, 10:54 AM

## 2018-07-21 NOTE — Patient Care Conference (Cosign Needed)
Inpatient RehabilitationTeam Conference and Plan of Care Update Date: 07/21/2018   Time: 10:30 AM    Patient Name: [name]      Medical Record Number: [id number]  Date of Birth: 27-Jul-1946 Sex: Female         Room/Bed: 4W09C/4W09C-01 Payor Info: Payor: MEDICARE / Plan: MEDICARE PART A AND B / Product Type: *No Product type* /    Admitting Diagnosis: iNTRACRANIAL HEMMORHAGE  Admit Date/Time:  07/19/2018  1:40 PM Admission Comments: No comment available   Primary Diagnosis:  <principal problem not specified> Principal Problem: <principal problem not specified>  Patient Active Problem List   Diagnosis Date Noted  . Intracerebral hemorrhage ([place]) 07/19/2018  . Acute CVA (cerebrovascular accident) ([place])   . Benign essential HTN   . Urinary retention   . Constipation   . ICH (intracerebral hemorrhage) ([place]) 07/13/2018  . Hypertension 07/06/2012  . Paroxysmal atrial fibrillation ([place]) 11/27/2011  . DIARRHEA, ACUTE 05/31/2009  . GERD 03/06/2009  . BACK PAIN 03/06/2009  . ANXIETY 06/12/2007  . DEPRESSION 06/12/2007  . HEADACHE 06/12/2007    Expected Discharge Date: Expected Discharge Date: 08/14/18  Team Members Present: Physician leading conference: Dr. Alysia Penna Social Worker Present: Ovidio Kin, LCSW Nurse Present: Dorien Chihuahua, RN PT Present: Lavone Nian, PT OT Present: [name], OT SLP Present: Stormy Fabian, SLP PPS Coordinator present : Daiva Nakayama, RN, CRRN     Current Status/Progress Goal Weekly Team Focus  Medical   Left neglect, Left hemiplegia  avoid falls, improve bladder emptying  Treat UTI   Bowel/Bladder   continenet of bowel, in/out cath Q4-6hrs, LBM 9/2  regain bladder function  continue in/out caths   Swallow/Nutrition/ Hydration             ADL's   +2 for all ADLs.  Lt homonymous hemianopsia, Lt lean in dynamic sitting, flaccid LUE  Min assist   ADL retraining, dynamic sitting balance, transfers, sit > stand, LUE NMR    Mobility   +2 assist overall for transfers, single step negotiation, and ambulation x 15 ft, mod assist w/c mobility, L inattention  supervision w/c mobility, min<>mod assist transfers, mod assist stair negotiation for home entry  midline orientation, sitting/standing balance, L NMR, L attention, transfers, gait, endurance, strengthening, w/c mobility   Communication             Safety/Cognition/ Behavioral Observations            Pain   c/o constant headache, has tylenol q 4 prn, used twice last night  pain scale <3/10  continue to assess & treat as needed   Skin   bruising to abdomen & arms, fissure to coccyx area, using barrier cream  no new areas of skin breakdown or worsening of fissure  assess q shift      *See Care Plan and progress notes for long and short-term goals.     Barriers to Discharge  Current Status/Progress Possible Resolutions Date Resolved   Physician    Medical stability;Neurogenic Bowel & Bladder     progressing with therapy  cont rehab , I/O,       Nursing  Neurogenic Bowel & Bladder               PT  Inaccessible home environment;Home environment access/layout;Lack of/limited family support  pt has multiple steps to enter house & may need ramp, unsure if family can provide 24 hr supervision that is recommended  OT Home environment access/layout                SLP                SW                Discharge Planning/Teaching Needs:  Family plans to take pt home with 24 hr care, trying to arrange this between family members. Son lives behind pt       Team Discussion:  Goals min assist level , currently plus 2 with stedy for transfers. L-shoulder pain with movement MD addressing. Working on attention and awareness. L-neglect from CVA. Will need 24 hr physical care at discharge. MD addressing BP issues.Dense hemiparesis. Continent of bowel and had to I & O cath this am due to retention. Neuro-psych to see for coping.  Revisions to Treatment  Plan:  DC 9/28    Continued Need for Acute Rehabilitation Level of Care: The patient requires daily medical management by a physician with specialized training in physical medicine and rehabilitation for the following conditions: Daily direction of a multidisciplinary physical rehabilitation program to ensure safe treatment while eliciting the highest outcome that is of practical value to the patient.: Yes Daily medical management of patient stability for increased activity during participation in an intensive rehabilitation regime.: Yes Daily analysis of laboratory values and/or radiology reports with any subsequent need for medication adjustment of medical intervention for : Neurological problems;Blood pressure problems   I attest that I was present, lead the team conference, and concur with the assessment and plan of the team. Dr. Alysia Penna  Elease Hashimoto 07/21/2018, 1:15 PM

## 2018-07-21 NOTE — Consult Note (Signed)
            [facility] CM Primary Care Navigator  07/21/2018  NAZLY DIGILIO December 30, 1945 [id number]   Attemptto see patient at the bedside to identify possible discharge needs but [facility]  Per MD note, patient presented with headache and bouts of vomiting. She had severe left-sided weakness and elevated blood pressure that resulted to this admission. (right occipitoparietal ICH- intracerebral hemorrhage, likely related to hypertensive urgency due tovomiting with colonoscopy)   Primary care provider's office is listed as providing transition of care (TOC) follow-up.  Patient has discharge instruction to follow-up with PCP (Dr. [name], MD) in 2 weeks following discharge from rehab.   For additional questions please contact:  Edwena Felty A. Harve Spradley, BSN, RN-BC [facility] PRIMARY CARE Navigator Cell: [phone]

## 2018-07-21 NOTE — Progress Notes (Signed)
Social Work  Grayton Lobo, Eliezer Champagne  Social Worker  Physical Medicine and Rehabilitation  Patient Care Conference  Cosign Needed  Date of Service:  07/21/2018  1:15 PM          Cosign Needed        Show:Clear all [x] Manual[x] Template[] Copied  Added by: [x] Savana Spina, Gardiner Rhyme, LCSW  [] Hover for details Inpatient RehabilitationTeam Conference and Plan of Care Update Date: 07/21/2018   Time: 10:30 AM      Patient Name: [name]      Medical Record Number: [id number]  Date of Birth: Mar 02, 1946 Sex: Female         Room/Bed: 4W09C/4W09C-01 Payor Info: Payor: MEDICARE / Plan: MEDICARE PART A AND B / Product Type: *No Product type* /     Admitting Diagnosis: iNTRACRANIAL HEMMORHAGE  Admit Date/Time:  07/19/2018  1:40 PM Admission Comments: No comment available    Primary Diagnosis:  <principal problem not specified> Principal Problem: <principal problem not specified>       Patient Active Problem List    Diagnosis Date Noted  . Intracerebral hemorrhage ([place]) 07/19/2018  . Acute CVA (cerebrovascular accident) ([place])    . Benign essential HTN    . Urinary retention    . Constipation    . ICH (intracerebral hemorrhage) ([place]) 07/13/2018  . Hypertension 07/06/2012  . Paroxysmal atrial fibrillation ([place]) 11/27/2011  . DIARRHEA, ACUTE 05/31/2009  . GERD 03/06/2009  . BACK PAIN 03/06/2009  . ANXIETY 06/12/2007  . DEPRESSION 06/12/2007  . HEADACHE 06/12/2007      Expected Discharge Date: Expected Discharge Date: 08/14/18   Team Members Present: Physician leading conference: Dr. Alysia Penna Social Worker Present: Ovidio Kin, LCSW Nurse Present: Dorien Chihuahua, RN PT Present: Lavone Nian, PT OT Present: [name], OT SLP Present: Stormy Fabian, SLP PPS Coordinator present : Daiva Nakayama, RN, CRRN       Current Status/Progress Goal Weekly Team Focus  Medical     Left neglect, Left hemiplegia  avoid falls, improve bladder emptying  Treat UTI     Bowel/Bladder     continenet of bowel, in/out cath Q4-6hrs, LBM 9/2  regain bladder function  continue in/out caths   Swallow/Nutrition/ Hydration               ADL's     +2 for all ADLs.  Lt homonymous hemianopsia, Lt lean in dynamic sitting, flaccid LUE  Min assist   ADL retraining, dynamic sitting balance, transfers, sit > stand, LUE NMR   Mobility     +2 assist overall for transfers, single step negotiation, and ambulation x 15 ft, mod assist w/c mobility, L inattention  supervision w/c mobility, min<>mod assist transfers, mod assist stair negotiation for home entry  midline orientation, sitting/standing balance, L NMR, L attention, transfers, gait, endurance, strengthening, w/c mobility   Communication               Safety/Cognition/ Behavioral Observations             Pain     c/o constant headache, has tylenol q 4 prn, used twice last night  pain scale <3/10  continue to assess & treat as needed   Skin     bruising to abdomen & arms, fissure to coccyx area, using barrier cream  no new areas of skin breakdown or worsening of fissure  assess q shift     *See Care Plan and progress notes for long and short-term goals.      Barriers  to Discharge   Current Status/Progress Possible Resolutions Date Resolved   Physician     Medical stability;Neurogenic Bowel & Bladder     progressing with therapy  cont rehab , I/O,       Nursing   Neurogenic Bowel & Bladder             PT  Inaccessible home environment;Home environment access/layout;Lack of/limited family support  pt has multiple steps to enter house & may need ramp, unsure if family can provide 24 hr supervision that is recommended              OT Home environment access/layout               SLP            SW              Discharge Planning/Teaching Needs:  Family plans to take pt home with 24 hr care, trying to arrange this between family members. Son lives behind pt       Team Discussion:  Goals min assist level ,  currently plus 2 with stedy for transfers. L-shoulder pain with movement MD addressing. Working on attention and awareness. L-neglect from CVA. Will need 24 hr physical care at discharge. MD addressing BP issues.Dense hemiparesis. Continent of bowel and had to I & O cath this am due to retention. Neuro-psych to see for coping.  Revisions to Treatment Plan:  DC 9/28    Continued Need for Acute Rehabilitation Level of Care: The patient requires daily medical management by a physician with specialized training in physical medicine and rehabilitation for the following conditions: Daily direction of a multidisciplinary physical rehabilitation program to ensure safe treatment while eliciting the highest outcome that is of practical value to the patient.: Yes Daily medical management of patient stability for increased activity during participation in an intensive rehabilitation regime.: Yes Daily analysis of laboratory values and/or radiology reports with any subsequent need for medication adjustment of medical intervention for : Neurological problems;Blood pressure problems     I attest that I was present, lead the team conference, and concur with the assessment and plan of the team. Dr. Alysia Penna   Elease Hashimoto 07/21/2018, 1:15 PM               Patient ID: [name], female   DOB: January 27, 1946, 72 y.o.   MRN: [id number]

## 2018-07-21 NOTE — Progress Notes (Addendum)
Physical Therapy Session Note  Patient Details  Name: [name] MRN: [id number] Date of Birth: [birth year]  Today's Date: 07/21/2018 PT Individual Time: 0920-1030 and 1345-1410 PT Individual Time Calculation (min): 70 min and 25 min    Short Term Goals: Week 1:  PT Short Term Goal 1 (Week 1): Pt will complete bed<>w/c transfers with max assist +1. PT Short Term Goal 2 (Week 1): Pt will propel w/c 50 ft with supervision. PT Short Term Goal 3 (Week 1): Pt will ambulate 25 ft with max assist +1. PT Short Term Goal 4 (Week 1): Pt will complete bed mobility with mod assist +1.  Skilled Therapeutic Interventions/Progress Updates:  Treatment 1: Pt received in w/c & agreeable to tx. Pt reported 4/10 HA that increased to 5/10 during session but RN administered meds at beginning of session. Transported pt to gym via w/c total assist for time management. Pt completes sit>stand with max assist with cuing to push up on armrest. Gait x 8 ft + 20 ft with R rail and max assist + w/c follow for safety. Therapist provides verbal cuing for sequencing of gait pattern, assistance with advancement of LLE (pt able to initiate step with LLE occasionally), assistance for placement of LLE and stabilizing L knee to prevent buckling when advancing RLE; therapist also provides assistance with weight shifting L<>R. Pt reports 5/10 HA & becomes tearful after gait & BP assessed, see below, & therapist provides emotional encouragement regarding situation and education regarding stroke recovery. Transitioned to dynavision from w/c level with task focusing on anterior weight shifting and scanning to L with pt requiring max cuing to scan L. Pt reports dizziness with task that does not go away even with deep breathing & rest breaks. Returned to room & BP assessed & found to be low, RN made aware & ted hose donned. BP did not increase therefore pt returned to bed with +2 total assist for squat pivot with max cuing for anterior  weight shifting & head/hips relationship but pt with poor ability to control trunk and inability to recognize significant L lateral lean & required +2 assist to return to supine due to poor positioning and awareness. While in bed therapist educated pt on rolling technique with cuing for compensatory technique but pt continues to require max assist for rolling R but is able to roll L with supervision. Pt performed supine bridging with BLE and therapist providing stability and placement of LLE with no hamstring activation noted. Pt with impaired attention to L side of body during session. At end of session pt left in bed with alarm set & all needs in reach, pt reported feeling better.  After ambulating at rail: BP = 114/61 mmHg, HR = 69 bpm  Sitting in w/c after dynavision: BP = 111/57 mmHg, HR = 67  After ted hose donned: BP = 111/46 mmHg, HR = 60 bpm  In bed at end of session:  BP = 134/70 mmHg, HR = 61 bpm    Treatment 2: Pt received on BSC in care of RN. Pt with continent BM but unable to void & RN reporting need to cath pt. Pt completed sit>stand in stedy with +2 assist and RN performed peri hygiene total assist. Pt returned to bed and transferred sit>supine with +2 assist with cuing for sequencing and to use RLE to assist LLE to bed. Therapist allowed RN to performed cathing then pt agreeable to tx. Pt without c/o pain but reports fatigue. Pt transfers supine>sitting EOB with  max assist with ongoing cuing for compensatory technique to use RLE to transfer LLE to EOB and to push trunk upright with RUE but pt attempts to reach for foot board to pull herself upright. Pt transferred sit>stand in stedy lift with Max assist with therapist attempting to maintain LUE hand hold on stedy for feedback & weight bearing but pt with no active movement or ability to assist in hand placement. Transitioned in front of mirror to use for visual feedback as pt reports L lateral lean feels like proper midline  orientation with therapist educating her on skewed sense of midline following stroke. Pt requires MAX cuing to attend to mirror to use it as feed back for postural alignment and is able to briefly correct posture when she does so. Pt reached outside of BOS to obtain paper towels to wipe her face & requires max assist to prevent L LOB. Pt reports feeling hot & nauseous & is returned to bed. Pt requires max assist for sit>supine as pt unable to use RLE to assist LLE up on bed. BP assessed & pt now reports 8/10 HA & RN made aware. Pt left in bed with alarm set & all needs in reach.   During session pt with c/o excessive pain/sensation when therapist touched LUE/LLE & PA (Pam) made aware.   In bed at end of session: BP = 119/51 mmHg, HR = 57 bpm   Therapy Documentation Precautions:  Precautions Precautions: Fall Precaution Comments: L hemi, L inattention/field cut Restrictions Weight Bearing Restrictions: No   See Function Navigator for Current Functional Status.   Therapy/Group: Individual Therapy  [name] 07/21/2018, 4:02 PM

## 2018-07-21 NOTE — Progress Notes (Signed)
Social Work Patient ID: [name], female   DOB: 04/02/1946, 72 y.o.   MRN: [id number]  Met with pt, son and daughter who were here to discuss team conference goals min assist level and target discharge 9/28. Pt is hopeful she will do better than the therapists think she is working hard in therapies. She feels better today than yesterday. Discussed with children the need for education prior to going home with the ones who will be providing care to her at home. Will work on discharge needs and have encouraged children to attend therapies with pt.

## 2018-07-21 NOTE — Progress Notes (Signed)
Speech Language Pathology Daily Session Note  Patient Details  Name: [name] MRN: [id number] Date of Birth: 05-18-1946  Today's Date: 07/21/2018 SLP Individual Time: 1300-1330 SLP Individual Time Calculation (min): 30 min  Short Term Goals: Week 1: SLP Short Term Goal 1 (Week 1): Patient will demonstrate functional problem solving for mildly complex tasks with supervision verbal cues. SLP Short Term Goal 2 (Week 1): Patient will attend to/locate items in left field of enviornment during functional tasks with Min A verbal cues.  SLP Short Term Goal 3 (Week 1): Patient will recall new, daily information with Mod I.  SLP Short Term Goal 4 (Week 1): Patient will self-monitor and correct errors during functional tasks with supervision verbal cues.   Skilled Therapeutic Interventions: Skilled treatment session focused on cognition goals. SLP facilitiated session within context of lunch. SLP received pt laying on left side d/t inability to maintain midline while sitting in bed consuming regular lunch tray. Pt had eaten food to the right of midline. SLP aided in repositioning pt and pt required Max A cues to locate food to left (plate had to be turned). Also despite Max A cues, pt not able to locate items (eye glasses) on left even with SLP standing to her left. After meal was finished, pt was left upright in bed, bed alarm on and all needs within reach. Continue per current plan of care.      Function:  Eating Eating   Modified Consistency Diet: No Eating Assist Level: Set up assist for   Eating Set Up Assist For: Opening containers;Cutting food       Cognition Comprehension Comprehension assist level: Follows basic conversation/direction with extra time/assistive device  Expression   Expression assist level: Expresses basic needs/ideas: With no assist  Social Interaction Social Interaction assist level: Interacts appropriately with others with medication or extra time  (anti-anxiety, antidepressant).  Problem Solving Problem solving assist level: Solves basic 90% of the time/requires cueing < 10% of the time  Memory Memory assist level: Recognizes or recalls 90% of the time/requires cueing < 10% of the time    Pain    Therapy/Group: Individual Therapy  [name] 07/21/2018, 2:17 PM

## 2018-07-21 NOTE — Consult Note (Signed)
Neuropsychological Consultation   Patient:   [name]   DOB:   [birth year]  MR Number:  [id number]  Location:  [facility] [id number] [place] [address] Dept: [facility]: [phone]           Date of Service:   07/21/2018  Start Time:   2 PM End Time:   3 PM  Provider/Observer:  Ilean Skill, Psy.D.       Clinical Neuropsychologist       Billing Code/Service: [phone] 4 Units  Chief Complaint:    [name] is a 72 year old female with history of HTN, PAF, obesity, Major Depression.  Admitted on 07/13/2018 with progressive headache, nausea/vomiting and left-sided weakness.  CT of head showed right hematoma with SAH/SDH.  Right parietal lobe hematoma.  MRI 07/14/2018 showed stable hematoma right parietal lobe with stable mass-effect.  CT 07/16/2018 showed right parietal hematoma to be stable and mild increasing edema and associated mass-effect.    Patient had prior history of Depression and OB/GYN starting Zoloft 10-15 years ago and continued by Dr. Felipa Eth.  Patient's Husband died little over 1 year ago and patient has had continued bereavement and slight increase in depression symptoms.  The length of time of this continuing would suggest worsening of her major depressive condition.  The patient reports that she has not told Dr. Felipa Eth about this worsening.  Reason for Service:  The patient was referred for neuropsychological consultation due to coping with both her chronic ongoing depression and bereavement in conjunction and exacerbated by her recent CVA.  The patient is struggling with loss of left-sided motor function and changes in somatosensory experiences for her left side.  Below is the HPI for the current admission.  HPI: [name] is a 72 year old RH- female with history of HTN, PAF, obesity who was admitted on 07/13/2018 with progressive headache,  nausea/vomiting and left-sided weakness.  History taken from chart review and patient.  CT of head reviewed, showing right hematoma with SAH/SDH.  Per report, right parietal lobe hematoma with small local SAH and SDH with local mass-effect.  Dr. Trenton Gammon felt that hemorrhage was due to hypertension and recommended monitoring with repeat CT head.  MRI/MRA brain done 8/28 and revealed table hematoma right parietal lobe with stable mass-effect.  MRA limited by motion artifact but no large vessel occlusion noted.  Aspirin discontinued and Dr. Erlinda Hong recommended repeating MRI/MRA brain when [place] resolves to rule out malignancy, AVM or aneurysm.  Cardene drip was used to control blood pressure.  CT head repeated 8/30 and showed right parietal hematoma to be stable and mild increasing edema and associated mass-effect.  Patient has had issues with headaches which is improving.  Foley was placed due to problems with urinary retention requiring I/O caths.  Low dose ASA resumed today.  Therapy ongoing and patient has had decline in functional status due to resultant left-sided weakness with sensory deficits and left homonymous  hemianopsia.  CIR was recommended for follow-up therapy  Current Status:  The patient acknowledges a worsening of her depressive symptoms recently.  She has continued to take Zoloft.  However, the patient has had increasing depressive symptoms over the past year or so after the death of her husband.  Her SSRI medication is not been adjusted during this time as she reports she wanted to try to manage these changes on her own and had not expressed the  worsening of depression even before her CVA to her PCP.  Behavioral Observation: [name]  presents as a 72 y.o.-year-old Right Caucasian Female who appeared her stated age. her dress was Appropriate and she was Well Groomed and her manners were Appropriate to the situation.  her participation was indicative of Appropriate and Attentive behaviors.   There were any physical disabilities noted.  she displayed an appropriate level of cooperation and motivation.     Interactions:    Active Appropriate  Attention:   abnormal and attention span appeared shorter than expected for age  Memory:   within normal limits; recent and remote memory intact  Visuo-spatial:  not examined  Speech (Volume):  low  Speech:   normal; normal  Thought Process:  Coherent and Relevant  Though Content:  WNL; not suicidal and not homicidal  Orientation:   person, place, time/date and situation  Judgment:   Good  Planning:   Good  Affect:    Depressed and Lethargic  Mood:    Dysphoric  Insight:   Good  Intelligence:   normal  Marital Status/Living: The patient lost her husband of many years when he passed away in 06-20-17.   Medical History:   Past Medical History:  Diagnosis Date  . Dyslipidemia   . GERD (gastroesophageal reflux disease)   . High risk medication use    on Flecainide since January of 2013  . Hypertension   . Obesity   . PAF (paroxysmal atrial fibrillation) ([place])    normal stress echo in 2011    Psychiatric History:  The patient does have a prior history of major depressive events.  Initially, the patient was started on SSRI medications 10 to 15 years ago during a time when she was trying to take care of her father and cope with demands of her family and life with her husband and children.  She is continued on SSRI medication (Zoloft) for the years since.   Recent exacerbation of depression after death of her husband in 2017/06/20.   Family Med/Psych History:  Family History  Problem Relation Age of Onset  . Alzheimer's disease Mother   . Heart disease Father   . Stroke Sister   . Lung cancer Brother   . Cancer Brother     Risk of Suicide/Violence: low Patient denies SI or HI.  Impression/DX:  [name] is a 72 year old female with history of HTN, PAF, obesity, Major Depression.  Admitted on 07/13/2018 with  progressive headache, nausea/vomiting and left-sided weakness.  CT of head showed right hematoma with SAH/SDH.  Right parietal lobe hematoma.  MRI 07/14/2018 showed stable hematoma right parietal lobe with stable mass-effect.  CT 07/16/2018 showed right parietal hematoma to be stable and mild increasing edema and associated mass-effect.    Patient had prior history of Depression and OB/GYN starting Zoloft 10-15 years ago and continued by Dr. Felipa Eth.  Patient's Husband died little over 1 year ago and patient has had continued bereavement and slight increase in depression symptoms.  The length of time of this continuing would suggest worsening of her major depressive condition.  The patient reports that she has not told Dr. Felipa Eth about this worsening.  The patient does have a prior history of major depressive events.  Initially, the patient was started on SSRI medications 10 to 15 years ago during a time when she was trying to take care of her father and cope with demands of her family and life with her  husband and children.  She is continued on SSRI medication (Zoloft) for the years since.   Recent exacerbation of depression after death of her husband in 07-06-17.  The patient acknowledges a worsening of her depressive symptoms recently.  She has continued to take Zoloft.  However, the patient has had increasing depressive symptoms over the past year or so after the death of her husband.  Her SSRI medication is not been adjusted during this time as she reports she wanted to try to manage these changes on her own and had not expressed the worsening of depression even before her CVA to her PCP.  Disposition/Plan:  Will follow-up next week.  Diagnosis:    Nontraumatic cortical hemorrhage of right cerebral hemisphere [facility]) - Plan: Ambulatory referral to Physical Medicine Rehab  Right-sided nontraumatic intraventricular intracerebral hemorrhage ([place]) - Plan: pantoprazole (PROTONIX) EC tablet 40 mg,  senna-docusate (Senokot-S) tablet 2 tablet, DISCONTINUED: MEDLINE mouth rinse, DISCONTINUED: senna-docusate (Senokot-S) tablet 1 tablet, DISCONTINUED: acetaminophen (TYLENOL) tablet 650 mg, DISCONTINUED: labetalol (NORMODYNE,TRANDATE) injection 5-10 mg, DISCONTINUED: ondansetron (ZOFRAN) injection 4 mg  Paroxysmal atrial fibrillation ([facility]) - Plan: aspirin EC tablet 81 mg, flecainide (TAMBOCOR) tablet 100 mg, flecainide (TAMBOCOR) tablet 50 mg, metoprolol tartrate (LOPRESSOR) tablet 25 mg, DISCONTINUED: metoprolol tartrate (LOPRESSOR) tablet 50 mg  Acute CVA (cerebrovascular accident) ([place]) - Plan: benazepril (LOTENSIN) tablet 20 mg  Essential hypertension - Plan: DISCONTINUED: hydrALAZINE (APRESOLINE) injection 10 mg  Current mild episode of major depressive disorder, unspecified whether recurrent ([place]) - Plan: sertraline (ZOLOFT) tablet 50 mg, DISCONTINUED: sertraline (ZOLOFT) tablet 50 mg, DISCONTINUED: zolpidem (AMBIEN) tablet 5 mg         Electronically Signed   _______________________ Ilean Skill, Psy.D.

## 2018-07-22 ENCOUNTER — Inpatient Hospital Stay (HOSPITAL_COMMUNITY): Payer: Medicare Other | Admitting: Physical Therapy

## 2018-07-22 ENCOUNTER — Ambulatory Visit: Payer: Medicare Other

## 2018-07-22 ENCOUNTER — Inpatient Hospital Stay (HOSPITAL_COMMUNITY): Payer: Medicare Other | Admitting: Occupational Therapy

## 2018-07-22 ENCOUNTER — Inpatient Hospital Stay (HOSPITAL_COMMUNITY): Payer: Medicare Other | Admitting: Speech Pathology

## 2018-07-22 ENCOUNTER — Inpatient Hospital Stay (HOSPITAL_COMMUNITY): Payer: Medicare Other | Admitting: *Deleted

## 2018-07-22 DIAGNOSIS — I499 Cardiac arrhythmia, unspecified: Secondary | ICD-10-CM | POA: Diagnosis not present

## 2018-07-22 DIAGNOSIS — I480 Paroxysmal atrial fibrillation: Secondary | ICD-10-CM | POA: Diagnosis not present

## 2018-07-22 LAB — URINE CULTURE

## 2018-07-22 MED ORDER — NITROFURANTOIN MONOHYD MACRO 100 MG PO CAPS
100.0000 mg | ORAL_CAPSULE | Freq: Two times a day (BID) | ORAL | Status: DC
  Administered 2018-07-22 – 2018-08-03 (×25): 100 mg via ORAL
  Filled 2018-07-22 (×25): qty 1

## 2018-07-22 MED ORDER — TAMSULOSIN HCL 0.4 MG PO CAPS
0.4000 mg | ORAL_CAPSULE | Freq: Every day | ORAL | Status: DC
  Administered 2018-07-22: 0.4 mg via ORAL
  Filled 2018-07-22: qty 1

## 2018-07-22 MED ORDER — ONDANSETRON HCL 4 MG/2ML IJ SOLN: 4.0000 mg | Freq: Four times a day (QID) | INTRAMUSCULAR | Status: DC | PRN

## 2018-07-22 MED ORDER — ONDANSETRON HCL 4 MG PO TABS
4.0000 mg | ORAL_TABLET | Freq: Four times a day (QID) | ORAL | Status: DC | PRN
  Filled 2018-07-22: qty 1

## 2018-07-22 MED ORDER — BENAZEPRIL HCL 20 MG PO TABS
10.0000 mg | ORAL_TABLET | Freq: Two times a day (BID) | ORAL | Status: DC
  Administered 2018-07-22 – 2018-08-10 (×37): 10 mg via ORAL
  Filled 2018-07-22 (×39): qty 1

## 2018-07-22 NOTE — Progress Notes (Signed)
Subjective/Complaints:  No issues overnite except HA this am, feels a little nauseated  ROS:  Denies CP, SOB, N/V/D   Objective: Vital Signs: Blood pressure (!) 154/72, pulse (!) 53, temperature 98 F (36.7 C), temperature source Oral, resp. rate 17, height _0  (1.676 m), weight 88.9 kg, SpO2 97 %. No results found. Results for orders placed or performed during the hospital encounter of 07/19/18 (from the past 72 hour(s))  Urine culture     Status: Abnormal (Preliminary result)   Collection Time: 07/19/18 11:30 PM  Result Value Ref Range   Specimen Description URINE, CATHETERIZED    Special Requests NONE    Culture (A)     >=100,000 COLONIES/mL ESCHERICHIA COLI SUSCEPTIBILITIES TO FOLLOW Performed at [facility] [address], [place], [place] [address]    Report Status PENDING   Comprehensive metabolic panel     Status: Abnormal   Collection Time: 07/20/18  6:13 AM  Result Value Ref Range   Sodium 136 135 - 145 mmol/L   Potassium 4.3 3.5 - 5.1 mmol/L   Chloride 102 98 - 111 mmol/L   CO2 24 22 - 32 mmol/L   Glucose, Bld 99 70 - 99 mg/dL   BUN 21 8 - 23 mg/dL   Creatinine, Ser 0.90 0.44 - 1.00 mg/dL   Calcium 9.6 8.9 - 10.3 mg/dL   Total Protein 5.8 (L) 6.5 - 8.1 g/dL   Albumin 3.2 (L) 3.5 - 5.0 g/dL   AST 34 15 - 41 U/L   ALT 59 (H) 0 - 44 U/L   Alkaline Phosphatase 114 38 - 126 U/L   Total Bilirubin 0.6 0.3 - 1.2 mg/dL   GFR calc non Af Amer >60 >60 mL/min   GFR calc Af Amer >60 >60 mL/min    Comment: (NOTE) The eGFR has been calculated using the CKD EPI equation. This calculation has not been validated in all clinical situations. eGFR's persistently <60 mL/min signify possible Chronic Kidney Disease.    Anion gap 10 5 - 15    Comment: Performed at [facility] [address], [place], [place] [address]  CBC WITH DIFFERENTIAL     Status: Abnormal   Collection Time: 07/20/18  6:13 AM  Result Value Ref Range   WBC 10.8 (H) 4.0 - 10.5 K/uL   RBC  4.81 3.87 - 5.11 MIL/uL   Hemoglobin 14.2 12.0 - 15.0 g/dL   HCT 42.8 36.0 - 46.0 %   MCV 89.0 78.0 - 100.0 fL   MCH 29.5 26.0 - 34.0 pg   MCHC 33.2 30.0 - 36.0 g/dL   RDW 12.7 11.5 - 15.5 %   Platelets 231 150 - 400 K/uL   Neutrophils Relative % 68 %   Neutro Abs 7.4 1.7 - 7.7 K/uL   Lymphocytes Relative 14 %   Lymphs Abs 1.5 0.7 - 4.0 K/uL   Monocytes Relative 10 %   Monocytes Absolute 1.0 0.1 - 1.0 K/uL   Eosinophils Relative 5 %   Eosinophils Absolute 0.5 0.0 - 0.7 K/uL   Basophils Relative 1 %   Basophils Absolute 0.1 0.0 - 0.1 K/uL   Immature Granulocytes 2 %   Abs Immature Granulocytes 0.2 (H) 0.0 - 0.1 K/uL    Comment: Performed at [facility] [address] [place], [place] [address]     HEENT: normal Cardio: RRR and no murmur Resp: CTA B/L and unlabored GI: BS positive and NT, ND Extremity:  Pulses positive  and No Edema Skin:   Intact Neuro: Flat, Abnormal Sensory absent sensation Left hand intact LT sensaiton Left foot , Abnormal Motor 0/5 in LUE, trace Left hip add LLE, normal strength on the R side, Normal FMC and Inattention Musc/Skel:  Other no pain with UE or LE ROM Gen NAD   Assessment/Plan: 1. 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 Physiatrist is providing close team supervision and 24 hour management of active medical problems listed below. Physiatrist and rehab team continue to assess barriers to discharge/monitor patient progress toward functional and medical goals. FIM: Function - Bathing Position: Wheelchair/chair at sink Body parts bathed by patient: Left arm, Chest, Abdomen, Right upper leg, Left upper leg Body parts bathed by helper: Right arm, Front perineal area, Buttocks, Right lower leg, Left lower leg, Back Assist Level: 2 helpers  Function- Upper Body Dressing/Undressing What is the  patient wearing?: Pull over shirt/dress, Bra Bra - Perfomed by patient: Thread/unthread right bra strap Bra - Perfomed by helper: Thread/unthread left bra strap, Hook/unhook bra (pull down sports bra) Pull over shirt/dress - Perfomed by patient: Thread/unthread right sleeve, Put head through opening Pull over shirt/dress - Perfomed by helper: Thread/unthread left sleeve, Pull shirt over trunk Assist Level: 2 helpers Function - Lower Body Dressing/Undressing What is the patient wearing?: Pants, Non-skid slipper socks Position: Wheelchair/chair at sink(sit > stand in [place]) Pants- Performed by helper: Thread/unthread right pants leg, Thread/unthread left pants leg, Pull pants up/down, Fasten/unfasten pants Non-skid slipper socks- Performed by helper: Don/doff right sock, Don/doff left sock Assist for lower body dressing: 2 Helpers  Function - Toileting Toileting activity did not occur: Safety/medical concerns Toileting steps completed by helper: Adjust clothing prior to toileting, Performs perineal hygiene, Adjust clothing after toileting Assist level: Two helpers  Function - Air cabin crew transfer activity did not occur: Safety/medical concerns Toilet transfer assistive device: Bedside commode, Mechanical lift Mechanical lift: Stedy Assist level to bedside commode (at bedside): 2 helpers Assist level from bedside commode (at bedside): 2 helpers  Function - Chair/bed transfer Chair/bed transfer method: Squat pivot Chair/bed transfer assist level: 2 helpers Chair/bed transfer assistive device: Armrests Mechanical lift: Stedy Chair/bed transfer details: Manual facilitation for placement, Manual facilitation for weight shifting, Verbal cues for technique, Verbal cues for sequencing, Tactile cues for sequencing, Tactile cues for weight shifting, Tactile cues for posture, Tactile cues for placement  Function - Locomotion: Wheelchair Type: Manual Max wheelchair distance: 100  ft Assist Level: Moderate assistance (Pt 50 - 74%) Assist Level: Moderate assistance (Pt 50 - 74%) Wheel 150 feet activity did not occur: Safety/medical concerns Turns around,maneuvers to table,bed, and toilet,negotiates 3% grade,maneuvers on rugs and over doorsills: No Function - Locomotion: Ambulation Assistive device: Rail in hallway Max distance: 20 ft  Assist level: 2 helpers(max assist + w/c follow) Assist level: 2 helpers Walk 50 feet with 2 turns activity did not occur: Safety/medical concerns Walk 150 feet activity did not occur: Safety/medical concerns Walk 10 feet on uneven surfaces activity did not occur: Safety/medical concerns  Function - Comprehension Comprehension: Auditory Comprehension assist level: Follows basic conversation/direction with extra time/assistive device  Function - Expression Expression: Verbal Expression assist level: Expresses basic needs/ideas: With no assist  Function - Social Interaction Social Interaction assist level: Interacts appropriately with others with medication or extra time (anti-anxiety, antidepressant).  Function - Problem Solving Problem solving assist level: Solves basic 90% of  the time/requires cueing < 10% of the time  Function - Memory Memory assist level: Recognizes or recalls 90% of the time/requires cueing < 10% of the time Patient normally able to recall (first 3 days only): Current season, Staff names and faces, That he or she is in a hospital, Location of own room  Medical Problem List and Plan: 1. Decline in functional status due to resultant left-sided weakness with sensory deficits and left homonymous  hemianopsia secondary to large Right occipitoparietal ICH CIR PT, OT 2.  DVT Prophylaxis/Anticoagulation: Mechanical: Sequential compression devices, below knee Bilateral lower extremities 3. Pain Management: Continue Tylenol as needed for headaches 4. Mood: LCSW to follow for evaluation and support 5. Neuropsych:  This patient is capable of making decisions on her own behalf. 6. Skin/Wound Care: Routine pressure relief measures 7. Fluids/Electrolytes/Nutrition: Monitor I's and O's.  Check lites in a.m. 8. HTN: Monitor blood pressure twice daily Vitals:   07/21/18 2005 07/22/18 0545  BP: (!) 146/52 (!) 154/72  Pulse: 61 (!) 53  Resp: 18 17  Temp: 98.3 F (36.8 C) 98 F (36.7 C)  SpO2: 97% 97%  9.  PAF: Monitor heart rate twice daily-- appears in NSR on Tambocor and metoprolol twice daily.Bradycardia will reduce metoprolol and monitor, sys BP mildly elevated 10. Constipation: MiraLAX today.  May need to increase senna to twice daily 11.  Urinary retention: Remove Foley today and start voiding trial.  Monitor voiding with PVR checks. E coli >100K on cath urine  On Macrodantin pending cx result       LOS (Days) 3 A FACE TO FACE EVALUATION WAS PERFORMED  Charlett Blake 07/22/2018, 7:28 AM

## 2018-07-22 NOTE — Progress Notes (Signed)
Occupational Therapy Session Note  Patient Details  Name: [name] MRN: [id number] Date of Birth: 02-23-1946  Today's Date: 07/22/2018 OT Individual Time: 1002-1055 OT Individual Time Calculation (min): 53 min    Short Term Goals: Week 1:  OT Short Term Goal 1 (Week 1): Pt will complete LB bathing with max assist OT Short Term Goal 2 (Week 1): Pt will complete toilet transfers with max assist of 1 caregiver OT Short Term Goal 3 (Week 1): Pt will don pants with max assist of 1 caregiver  Skilled Therapeutic Interventions/Progress Updates:    Treatment session with focus on ADL retraining with sit > stand and activity tolerance.  Pt received upright in w/c upon arrival.  Per pt and RN pt with c/o dizziness this AM upon getting up with SLP.  RN requesting therapist incorporate orthostatic vitals into therapy session.  Pt reporting headache and nausea even in sitting.  Pt willing to engage in sit > stand in [place] to obtain vitals.  BP in sitting 137/65 then 144/74 in standing in [place] as pt unable to maintain standing long enough to obtain upright standing BP.  PA (Pam) in to assess pt and able to maintain standing with UE support on Stedy and mod assist from therapist long enough to get BP in standing with 88/75 and pt reporting feeling hot, heart burn, and starting to dry heave.  Pt returned to sitting and applied cool cloth to face and neck.  RN provided nausea and pain meds.  Pt wanting to complete oral care in sitting in w/c.  Mod cues to visually scan to Lt to obtain tooth brush which pt able to complete with increased time and cues.  Returned to bed with use of Stedy and +2 for support on Lt UE and for postural control.  Pt positioned to allow support for LUE.  Educated on positioning in supine and sidelying as well as desensitization strategies for LUE hypersensitivity to touch.  Therapy Documentation Precautions:  Precautions Precautions: Fall Precaution Comments: L hemi, L  inattention/field cut Restrictions Weight Bearing Restrictions: No General:   Vital Signs: Therapy Vitals Pulse Rate: 70 BP: (!) 88/75 Patient Position (if appropriate): Standing Pain: Pain Assessment Pain Scale: 0-10 Pain Score: 9 Pain Type: Chronic pain Pain Location: Head Pain Descriptors / Indicators: Headache Pain Onset: On-going Pain Intervention(s): RN made aware   See Function Navigator for Current Functional Status.   Therapy/Group: Individual Therapy  [name] 07/22/2018, 12:17 PM

## 2018-07-22 NOTE — Progress Notes (Signed)
Physical Therapy Session Note  Patient Details  Name: [name] MRN: [id number] Date of Birth: Jun 14, 1946  Today's Date: 07/22/2018 PT Individual Time: 1430-1545 PT Individual Time Calculation (min): 75 min   Short Term Goals: Week 1:  PT Short Term Goal 1 (Week 1): Pt will complete bed<>w/c transfers with max assist +1. PT Short Term Goal 2 (Week 1): Pt will propel w/c 50 ft with supervision. PT Short Term Goal 3 (Week 1): Pt will ambulate 25 ft with max assist +1. PT Short Term Goal 4 (Week 1): Pt will complete bed mobility with mod assist +1.  Skilled Therapeutic Interventions/Progress Updates:    Pt received supine in bed,  reports feeling very fatigued this PM but agreeable to PT. Pt reports no pain at rest but onset of 7/10 HA once sitting EOB. Supine BP 121/47, seated BP 142/70. No instances of nausea or dizziness throughout therapy session. Supine to sit with max A for LLE management and trunk control. Stedy transfer bed to w/c with assist x 2 due to significant lean to the L in sitting. Standing frame x 15 min with 4" step under BLE. Focus on finding midline with use of mirror for visual feedback, verbal cues, mod manual cues. Reaching for cards outside BOS with RUE while maintaining upright stance with focus on memorizing card # and suit as well as focus on scanning to the L. Manual w/c propulsion x 50 ft, x 100 ft with R UE/LE with mod A for steering and v/c to attend to L visual field. Stedy transfer w/c to recliner assist x 2. Pt left seated in recliner with needs in reach, chair alarm in place, family present.  Therapy Documentation Precautions:  Precautions Precautions: Fall Precaution Comments: L hemi, L inattention/field cut Restrictions Weight Bearing Restrictions: No  See Function Navigator for Current Functional Status.   Therapy/Group: Individual Therapy  Excell Seltzer, PT, DPT  07/22/2018, 4:10 PM

## 2018-07-22 NOTE — IPOC Note (Addendum)
Overall Plan of Care [facility]) Patient Details Name: [name] MRN: [id number] DOB: Dec 22, 1945  Admitting Diagnosis: <principal problem not specified>  Hospital Problems: Active Problems:   Intracerebral hemorrhage (HCC)   Current mild episode of major depressive disorder ([place])     Functional Problem List: Nursing Bladder, Bowel, Endurance, Edema, Motor, Skin Integrity, Sensory, Pain  PT Balance, Behavior, Perception, Endurance, Safety, Motor, Sensory  OT Balance, Endurance, Motor, Cognition, Pain, Perception, Safety, Sensory, Skin Integrity, Vision  SLP    TR         Basic ADL's: OT Eating, Grooming, Bathing, Dressing, Toileting     Advanced  ADL's: OT       Transfers: PT Car, Bed to Chair, Bed Mobility, Manufacturing systems engineer, Metallurgist: PT Ambulation, Emergency planning/management officer, Stairs     Additional Impairments: OT Fuctional Use of Upper Extremity  SLP Social Cognition   Memory, Problem Solving, Awareness  TR      Anticipated Outcomes Item Anticipated Outcome  Self Feeding Supervision/setup  Swallowing      Basic self-care  Min assist  Toileting  Mod assist   Bathroom Transfers Min-mod assist  Bowel/Bladder  Min assist  Transfers  min<>mod assist overall  Locomotion  supervision w/c mobility, min assist gait with PT, mod assist stair negotiation  Communication     Cognition  Mod I   Pain  < 3  Safety/Judgment  Supervision   Therapy Plan: PT Intensity: Minimum of 1-2 x/day ,45 to 90 minutes PT Frequency: 5 out of 7 days PT Duration Estimated Length of Stay: 3-4 weeks OT Intensity: Minimum of 1-2 x/day, 45 to 90 minutes OT Frequency: 5 out of 7 days OT Duration/Estimated Length of Stay: 24-28 days SLP Intensity: Minumum of 1-2 x/day, 30 to 90 minutes SLP Frequency: 3 to 5 out of 7 days SLP Duration/Estimated Length of Stay: 24-28 days     Team Interventions: Nursing Interventions Patient/Family Education, Bladder  Management, Bowel Management, Medication Management, Pain Management, Disease Management/Prevention  PT interventions Ambulation/gait training, Cognitive remediation/compensation, Discharge planning, DME/adaptive equipment instruction, Functional mobility training, Pain management, Psychosocial support, Splinting/orthotics, Therapeutic Activities, UE/LE Strength taining/ROM, Visual/perceptual remediation/compensation, Wheelchair propulsion/positioning, UE/LE Coordination activities, Therapeutic Exercise, Stair training, Patient/family education, Neuromuscular re-education, Functional electrical stimulation, Disease management/prevention, Academic librarian, Training and development officer  OT Interventions Training and development officer, Cognitive remediation/compensation, Discharge planning, Disease mangement/prevention, DME/adaptive equipment instruction, Functional electrical stimulation, Functional mobility training, Neuromuscular re-education, Pain management, Patient/family education, Psychosocial support, Self Care/advanced ADL retraining, Skin care/wound managment, Splinting/orthotics, Therapeutic Activities, Therapeutic Exercise, UE/LE Strength taining/ROM, UE/LE Coordination activities, Visual/perceptual remediation/compensation, Wheelchair propulsion/positioning  SLP Interventions Cognitive remediation/compensation, Environmental controls, Internal/external aids, Therapeutic Activities, Patient/family education, Cueing hierarchy, Functional tasks  TR Interventions    SW/CM Interventions Discharge Planning, Psychosocial Support, Patient/Family Education   Barriers to Discharge MD  Medical stability  Nursing Neurogenic Bowel & Bladder    PT Inaccessible home environment, Home environment access/layout, Lack of/limited family support pt has multiple steps to enter house & may need ramp, unsure if family can provide 24 hr supervision that is recommended  OT Home environment access/layout    SLP       SW       Team Discharge Planning: Destination: PT-Home ,OT- Home , SLP-Home Projected Follow-up: PT-Home health PT, 24 hour supervision/assistance, OT-  Home health OT, 24 hour supervision/assistance, SLP-None Projected Equipment Needs: PT-To be determined, OT- 3 in 1 bedside comode, SLP-None recommended by SLP Equipment Details: PT- , OT-  Patient/family involved  in discharge planning: PT- Patient,  OT-Patient, SLP-Patient  MD ELOS: 19-24d Medical Rehab Prognosis:  Good Assessment: 72 year old RH- female with history of HTN, PAF, obesity who was admitted on 07/13/2018 with progressive headache, nausea/vomiting and left-sided weakness.  History taken from chart review and patient.  CT of head reviewed, showing right hematoma with SAH/SDH.  Per report, right parietal lobe hematoma with small local SAH and SDH with local mass-effect.  Dr. Trenton Gammon felt that hemorrhage was due to hypertension and recommended monitoring with repeat CT head.  MRI/MRA brain done 8/28 and revealed table hematoma right parietal lobe with stable mass-effect.  MRA limited by motion artifact but no large vessel occlusion noted.  Aspirin discontinued and Dr. Erlinda Hong recommended repeating MRI/MRA brain when [place] resolves to rule out malignancy, AVM or aneurysm.  Cardene drip was used to control blood pressure.  CT head repeated 8/30 and showed right parietal hematoma to be stable and mild increasing edema and associated mass-effect.  Patient has had issues with headaches which is improving.  Foley was placed due to problems with urinary retention requiring I/O caths.  Low dose ASA resumed today.  Therapy ongoing and patient has had decline in functional status due to resultant left-sided weakness with sensory deficits and left homonymous  hemianopsia   Now requiring 24/7 Rehab RN,MD, as well as CIR level PT, OT and SLP.  Treatment team will focus on ADLs and mobility with goals set at [facility]  See Team Conference Notes for weekly  updates to the plan of care

## 2018-07-22 NOTE — Plan of Care (Signed)
  Problem: RH BLADDER ELIMINATION Goal: RH STG MANAGE BLADDER WITH ASSISTANCE Description STG Manage Bladder With min Assistance  Outcome: Not Progressing; unable to urinate; straight cath q 6

## 2018-07-22 NOTE — Progress Notes (Signed)
Patient c/o nausea ; BP standing 88/71; Pam PA notified.

## 2018-07-22 NOTE — Progress Notes (Signed)
Speech Language Pathology Daily Session Note  Patient Details  Name: [name] MRN: [id number] Date of Birth: [birth year]  Today's Date: 07/22/2018 SLP Individual Time: 0830-0930 SLP Individual Time Calculation (min): 60 min  Short Term Goals: Week 1: SLP Short Term Goal 1 (Week 1): Patient will demonstrate functional problem solving for mildly complex tasks with supervision verbal cues. SLP Short Term Goal 2 (Week 1): Patient will attend to/locate items in left field of enviornment during functional tasks with Min A verbal cues.  SLP Short Term Goal 3 (Week 1): Patient will recall new, daily information with Mod I.  SLP Short Term Goal 4 (Week 1): Patient will self-monitor and correct errors during functional tasks with supervision verbal cues.   Skilled Therapeutic Interventions: Skilled treatment session focused on cognition goals. SLP received pt in bed alert but required encouragement to participate in therapy. She states that she feels tired. Pt agreeable to therapy. SLP facilitated with aided transfer via Stedy to wheelchair. Pt with increased report of dizziness, SLP took BP 124/65 with HR 78 and chronic headache of 6 out of 10. Nurses notified and authorized therapy to continue, nursing will treat headache. Pt able to participate upright in wheelchair with moderate encouragement (she initially stated that she onlyu wanted to go back to bed and sleep). Simple card game played in dayroom with Max A cues required for left attention. After task began, pt became distracted by pt and didn't talk about dizziness or headache. Pt required Mod A to maintain upright neutral positioning at table. Pt returned to room, transferred back to bed, left with bed alarm on and all needs within reach. Continue per current plan of care.      Function:    Cognition Comprehension Comprehension assist level: Follows basic conversation/direction with extra time/assistive device  Expression   Expression  assist level: Expresses basic needs/ideas: With no assist  Social Interaction Social Interaction assist level: Interacts appropriately with others with medication or extra time (anti-anxiety, antidepressant).  Problem Solving Problem solving assist level: Solves basic 90% of the time/requires cueing < 10% of the time  Memory Memory assist level: Recognizes or recalls 90% of the time/requires cueing < 10% of the time    Pain Pain Assessment Pain Scale: 0-10 Pain Score: 6  Pain Type: Chronic pain Pain Location: Head Pain Descriptors / Indicators: Headache Pain Onset: On-going Pain Intervention(s): RN made aware  Therapy/Group: Individual Therapy  Carman Auxier 07/22/2018, 9:08 AM

## 2018-07-22 NOTE — Progress Notes (Signed)
RN was called to room by ST re: c/o dizziness after getting patient up from bed to chair with stedy. Vital signs taken. MD notified ok to give meds and new order for orthostatic vitals. Patient with therapist at this time.

## 2018-07-22 NOTE — Progress Notes (Signed)
Recreational Therapy Assessment and Plan  Patient Details  Name: [name] MRN: [id number] Date of Birth: 12-27-1945 Today's Date: 07/22/2018  Rehab Potential:  ELOS: d/c 9/28  Assessment Problem List:      Patient Active Problem List   Diagnosis Date Noted  . Intracerebral hemorrhage ([place]) 07/19/2018  . Acute CVA (cerebrovascular accident) ([place])   . Benign essential HTN   . Urinary retention   . Constipation   . ICH (intracerebral hemorrhage) ([place]) 07/13/2018  . Hypertension 07/06/2012  . Paroxysmal atrial fibrillation ([place]) 11/27/2011  . DIARRHEA, ACUTE 05/31/2009  . GERD 03/06/2009  . BACK PAIN 03/06/2009  . ANXIETY 06/12/2007  . DEPRESSION 06/12/2007  . HEADACHE 06/12/2007    Past Medical History:  Past Medical History:  Diagnosis Date  . Dyslipidemia   . GERD (gastroesophageal reflux disease)   . High risk medication use    on Flecainide since January of 2013  . Hypertension   . Obesity   . PAF (paroxysmal atrial fibrillation) ([facility])    normal stress echo in 2011   Past Surgical History:       Past Surgical History:  Procedure Laterality Date  . +    . ABDOMINAL HYSTERECTOMY    . ANKLE SURGERY    . CARDIOVASCULAR STRESS TEST  07/21/2007   EF 80%, NORMAL NONDIAGONISTIC FOR ISCHEMIA  . STRESS ECHO TEST  07/25/2010   EF 60%  . WRIST SURGERY     RIGHT WRIST    Assessment & Plan Clinical Impression: Patient is a 72 y.o. right handed female with history of HTN, PAF, obesity who was admitted on 07/13/2018 with progressive headache, nausea/vomiting and left-sided weakness. History taken from chart review and patient. CT of head reviewed, showing right hematoma with SAH/SDH. Per report, right parietal lobe hematoma with small local SAH and SDH with local mass-effect. Dr. Trenton Gammon felt that hemorrhage was due to hypertension and recommended monitoring with repeat CT head. MRI/MRA brain done 8/28 and revealed table hematoma right  parietal lobe with stable mass-effect. MRA limited by motion artifact but no large vessel occlusion noted. Aspirin discontinued and Dr. Erlinda Hong recommended repeating MRI/MRA brain when [place] resolves to rule out malignancy, AVM or aneurysm. Cardene drip was used to control blood pressure.  CT head repeated 8/30 and showed right parietal hematoma to be stable and mild increasing edema and associated mass-effect. Patient has had issues with headaches which is improving. Foley was placed due to problems with urinary retention requiring I/O caths. Low dose ASA resumed today. Therapy ongoing and patient has had decline in functional status due to resultant left-sided weakness with sensory deficits and left homonymous hemianopsia. CIR was recommended for follow-up therapy.   Patient transferred to CIR on 07/19/2018.    Pt presents with decreased activity tolerance, decreased functional mobility, decreased balance, left inattention, decreased awareness, decreased problem solving, decreased safety Limiting pt's independence with leisure/community pursuits.  Plan Min 1 TR session >20 minutes during LOS  Recommendations for other services: Neuropsych  Discharge Criteria: Patient will be discharged from TR if patient refuses treatment 3 consecutive times without medical reason.  If treatment goals not met, if there is a change in medical status, if patient makes no progress towards goals or if patient is discharged from hospital.  The above assessment, treatment plan, treatment alternatives and goals were discussed and mutually agreed upon: by patient  [place] 07/22/2018, 12:28 PM

## 2018-07-23 ENCOUNTER — Inpatient Hospital Stay (HOSPITAL_COMMUNITY): Payer: Medicare Other | Admitting: Occupational Therapy

## 2018-07-23 ENCOUNTER — Inpatient Hospital Stay (HOSPITAL_COMMUNITY): Payer: Medicare Other | Admitting: Physical Therapy

## 2018-07-23 ENCOUNTER — Inpatient Hospital Stay (HOSPITAL_COMMUNITY): Payer: Medicare Other | Admitting: Speech Pathology

## 2018-07-23 MED ORDER — METOPROLOL TARTRATE 12.5 MG HALF TABLET
12.5000 mg | ORAL_TABLET | Freq: Two times a day (BID) | ORAL | Status: DC
  Administered 2018-07-23 – 2018-08-16 (×47): 12.5 mg via ORAL
  Filled 2018-07-23 (×48): qty 1

## 2018-07-23 NOTE — Progress Notes (Signed)
Subjective/Complaints:  Discussed bleed vs infarct, discussed deficits and recovery process  ROS:  Denies CP, SOB, N/V/D   Objective: Vital Signs: Blood pressure (!) 101/46, pulse (!) 51, temperature 97.9 F (36.6 C), temperature source Oral, resp. rate 16, height 5\' 6"  (1.676 m), weight 88.7 kg, SpO2 95 %. No results found. No results found for this or any previous visit (from the past 72 hour(s)).   HEENT: normal Cardio: RRR and no murmur Resp: CTA B/L and unlabored GI: BS positive and NT, ND Extremity:  Pulses positive and No Edema Skin:   Intact Neuro: Flat, Abnormal Sensory absent sensation Left hand intact LT sensaiton Left foot , Abnormal Motor 0/5 in LUE, trace Left hip add LLE, normal strength on the R side, Normal FMC and Inattention Musc/Skel:  Other no pain with UE or LE ROM Gen NAD   Assessment/Plan: 1. 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 Physiatrist is providing close team supervision and 24 hour management of active medical problems listed below. Physiatrist and rehab team continue to assess barriers to discharge/monitor patient progress toward functional and medical goals. FIM: Function - Bathing Position: Wheelchair/chair at sink Body parts bathed by patient: Left arm, Chest, Abdomen, Right upper leg, Left upper leg Body parts bathed by helper: Right arm, Front perineal area, Buttocks, Right lower leg, Left lower leg, Back Assist Level: 2 helpers  Function- Upper Body Dressing/Undressing What is the patient wearing?: Pull over shirt/dress, Bra Bra - Perfomed by patient: Thread/unthread right bra strap Bra - Perfomed by helper: Thread/unthread left bra strap, Hook/unhook bra (pull down sports bra) Pull over shirt/dress - Perfomed by patient: Thread/unthread right sleeve, Put head through opening Pull over  shirt/dress - Perfomed by helper: Thread/unthread left sleeve, Pull shirt over trunk Assist Level: 2 helpers Function - Lower Body Dressing/Undressing What is the patient wearing?: Pants, Non-skid slipper socks Position: Wheelchair/chair at sink(sit > stand in [place]) Pants- Performed by helper: Thread/unthread right pants leg, Thread/unthread left pants leg, Pull pants up/down, Fasten/unfasten pants Non-skid slipper socks- Performed by helper: Don/doff right sock, Don/doff left sock Assist for lower body dressing: 2 Helpers  Function - Toileting Toileting activity did not occur: Safety/medical concerns Toileting steps completed by helper: Adjust clothing prior to toileting, Performs perineal hygiene, Adjust clothing after toileting Assist level: Two helpers  Function - Air cabin crew transfer activity did not occur: Safety/medical concerns Toilet transfer assistive device: Bedside commode, Mechanical lift Mechanical lift: Stedy Assist level to bedside commode (at bedside): 2 helpers Assist level from bedside commode (at bedside): 2 helpers  Function - Chair/bed transfer Chair/bed transfer method: Other Chair/bed transfer assist level: 2 helpers Chair/bed transfer assistive device: Mechanical lift Mechanical lift: Stedy Chair/bed transfer details: Manual facilitation for placement, Manual facilitation for weight shifting, Verbal cues for technique, Verbal cues for sequencing, Tactile cues for sequencing, Tactile cues for weight shifting, Tactile cues for posture, Tactile cues for placement  Function - Locomotion: Wheelchair Type: Manual Max wheelchair distance: 100' Assist Level: Moderate assistance (Pt 50 - 74%) Assist Level: Moderate assistance (Pt 50 - 74%) Wheel 150 feet activity did not occur: Safety/medical concerns Turns around,maneuvers to table,bed, and toilet,negotiates 3% grade,maneuvers on rugs and over doorsills: No Function - Locomotion: Ambulation Assistive  device: Rail in hallway Max distance: 20 ft  Assist level: 2 helpers(max assist + w/c follow) Assist level: 2 helpers Walk 50 feet with 2  turns activity did not occur: Safety/medical concerns Walk 150 feet activity did not occur: Safety/medical concerns Walk 10 feet on uneven surfaces activity did not occur: Safety/medical concerns  Function - Comprehension Comprehension: Auditory Comprehension assist level: Follows basic conversation/direction with extra time/assistive device  Function - Expression Expression: Verbal Expression assist level: Expresses basic needs/ideas: With no assist  Function - Social Interaction Social Interaction assist level: Interacts appropriately with others with medication or extra time (anti-anxiety, antidepressant).  Function - Problem Solving Problem solving assist level: Solves basic 90% of the time/requires cueing < 10% of the time  Function - Memory Memory assist level: Recognizes or recalls 90% of the time/requires cueing < 10% of the time Patient normally able to recall (first 3 days only): Current season, Staff names and faces, That he or she is in a hospital, Location of own room  Medical Problem List and Plan: 1. Decline in functional status due to resultant left-sided weakness with sensory deficits and left homonymous  hemianopsia secondary to large Right occipitoparietal ICH CIR PT, OT, SLP 2.  DVT Prophylaxis/Anticoagulation: Mechanical: Sequential compression devices, below knee Bilateral lower extremities 3. Pain Management: Continue Tylenol as needed for headaches 4. Mood: LCSW to follow for evaluation and support 5. Neuropsych: This patient is capable of making decisions on her own behalf. 6. Skin/Wound Care: Routine pressure relief measures 7. Fluids/Electrolytes/Nutrition: Monitor I's and O's.  Lytes nl 9/3 8. HTN: Monitor blood pressure twice daily Vitals:   07/22/18 2112 07/23/18 0450  BP: (!) 125/42 (!) 101/46  Pulse: (!) 58 (!)  51  Resp: 16   Temp: 97.8 F (36.6 C) 97.9 F (36.6 C)  SpO2: 96% 95%  9.  PAF: Monitor heart rate twice daily-- appears in NSR on Tambocor and metoprolol twice daily.Bradycardia will reduce metoprolol to 12.5mg  BID and monitor, BP a little soft this am 10. Constipation: MiraLAX today.  May need to increase senna to twice daily 11.  Urinary retention: Remove Foley today and start voiding trial.  Monitor voiding with PVR checks. E coli >100K on cath urine  Macrodantin Sensitive     LOS (Days) 4 A FACE TO FACE EVALUATION WAS PERFORMED  Charlett Blake 07/23/2018, 7:29 AM

## 2018-07-23 NOTE — Progress Notes (Addendum)
Occupational Therapy Session Note  Patient Details  Name: [name] MRN: [id number] Date of Birth: 1946/03/20  Today's Date: 07/23/2018 OT Individual Time: 1005-1100 OT Individual Time Calculation (min): 55 min    Short Term Goals: Week 1:  OT Short Term Goal 1 (Week 1): Pt will complete LB bathing with max assist OT Short Term Goal 2 (Week 1): Pt will complete toilet transfers with max assist of 1 caregiver OT Short Term Goal 3 (Week 1): Pt will don pants with max assist of 1 caregiver  Skilled Therapeutic Interventions/Progress Updates:    Treatment session with focus on activity tolerance and Lt attention. Pt received in bed having just been I/O cathed.  Completed bed mobility and supine to sitting at EOB with mod assist.  Completed squat pivot transfer to w/c with max assist +2, pt with improved initiation with weight shift during transfer to Rt.  Engaged in grooming tasks in sitting at sink with pt demonstrating improved visual scanning to Lt to obtain items to complete oral care.  Engaged in visual scanning task at table with focus on completing simple patterns and sorting in numerical order with focus on locating items in Lt visual field.  Transferred back to bed at end of session with total assist +2 with decreased weight shift during transfer to Lt.  Deferred standing at this time as pt with orthostatic hypotension in standing and awaiting larger abdominal binder as well as pt with 9/10 headache and not yet due for another tylenol.    Therapy Documentation Precautions:  Precautions Precautions: Fall Precaution Comments: L hemi, L inattention/field cut Restrictions Weight Bearing Restrictions: No Pain: Pain Assessment Pain Scale: 0-10 Pain Score: 9  Pain Type: Acute pain Pain Location: Head Pain Orientation: Right Pain Descriptors / Indicators: Headache Pain Frequency: Constant Pain Onset: On-going Patients Stated Pain Goal: 4 Pain Intervention(s): Medication  (See eMAR)  See Function Navigator for Current Functional Status.   Therapy/Group: Individual Therapy  [name] 07/23/2018, 12:45 PM

## 2018-07-23 NOTE — Progress Notes (Signed)
Speech Language Pathology Daily Session Note  Patient Details  Name: [name] MRN: [id number] Date of Birth: 1946/08/28  Today's Date: 07/23/2018 SLP Individual Time: 1440-1510 SLP Individual Time Calculation (min): 30 min  Short Term Goals: Week 1: SLP Short Term Goal 1 (Week 1): Patient will demonstrate functional problem solving for mildly complex tasks with supervision verbal cues. SLP Short Term Goal 2 (Week 1): Patient will attend to/locate items in left field of enviornment during functional tasks with Min A verbal cues.  SLP Short Term Goal 3 (Week 1): Patient will recall new, daily information with Mod I.  SLP Short Term Goal 4 (Week 1): Patient will self-monitor and correct errors during functional tasks with supervision verbal cues.   Skilled Therapeutic Interventions: Skilled treatment session focused on cognition goals. SLP facilitated session by providing Mod A to locate items on left of environment. Pt able to recall blood pressure issues and she continues to report increased fatigue. Pt able to recall orientation information and 2 activities from sessions throughout the day. Pt left upright in bed, bed alarm on and all needs within reach.      Function:    Cognition Comprehension Comprehension assist level: Follows basic conversation/direction with extra time/assistive device  Expression   Expression assist level: Expresses basic needs/ideas: With no assist  Social Interaction Social Interaction assist level: Interacts appropriately with others with medication or extra time (anti-anxiety, antidepressant).  Problem Solving Problem solving assist level: Solves basic 90% of the time/requires cueing < 10% of the time  Memory Memory assist level: Recognizes or recalls 75 - 89% of the time/requires cueing 10 - 24% of the time    Pain Pain Assessment Pain Score: Asleep  Therapy/Group: Individual Therapy  [name] 07/23/2018, 3:14 PM

## 2018-07-23 NOTE — Progress Notes (Addendum)
Physical Therapy Session Note  Patient Details  Name: [name] MRN: [id number] Date of Birth: [birth year]  Today's Date: 07/23/2018 PT Individual Time: [phone] and [phone] PT Individual Time Calculation (min): 78 min and 23 min  Short Term Goals: Week 1:  PT Short Term Goal 1 (Week 1): Pt will complete bed<>w/c transfers with max assist +1. PT Short Term Goal 2 (Week 1): Pt will propel w/c 50 ft with supervision. PT Short Term Goal 3 (Week 1): Pt will ambulate 25 ft with max assist +1. PT Short Term Goal 4 (Week 1): Pt will complete bed mobility with mod assist +1.  Skilled Therapeutic Interventions/Progress Updates:  Treatment 1: Pt received in bed & agreeable to tx. Pt reports 5/10 R side HA but RN reports she is premedicated. Vitals assessed throughout session & RN & MD made aware of pt's low BP. Thigh high teds & abdominal binder donned bed level total assist for time management. BLE ace wrapped for further BP management as well. Pt rolls L with max assist with max cuing for compensatory technique to use RUE/RLE to assist LUE/LLE but pt unable to roll without significant assistance. Pt does roll L with steady assist and bed rails. Pt transfers L sidelying>sitting EOB with max assist as pt with decreased ability to push up with RUE. Pt able to sit EOB with min assist for balance. Pt transfers bed>w/c and w/c<>mat table with max +2 assist. Pt requires MAX cuing for head/hips relationship, anterior weight shifting, and to push to clear buttocks from seat. Pt with great difficulty controlling L side of body especially while anterior weight shifting. Pt propels w/c room>gym with R hemi technique and mod assist with MAX cuing 2/2 L inattention. Pt transferred to mat table and engaged in dynamic balance task requiring her to attend to L side as well; pt requires assistance for dynamic balance but is able to demonstrate static sitting balance with close supervision<>steady assist. Pt with  skin tear on LUE during session & RN made aware. At end of session pt left sitting in w/c in room with all needs in reach.  Supine in bed: BP = 102/39 mmHg (RUE), HR = 58 bpm Rechecked in LUE: BP = 106/43 mmHg, HR = 56 bpm  After thigh highs & binder donned, supine: BP = 117/50 mmHg, HR = 56 bpm  Sitting EOB: BP = 123/61 mmHg, HR = 61 bpm  Sitting in w/c:  BP = 110/48 mmHg, HR = 62 bpm  After propelling w/c to gym: BP = 99/49 mmHg, HR = 60 -- ace wraps donned  Sitting EOM: BP = 110/69 mmHg  Sitting in w/c at end of session:  BP = 112/48 mmHg   Treatment 2: Pt received in bed reporting 5/10 HA & RN made aware. BP assessed in R wrist = 139/60 mmHg, HR = 60 bpm with thigh high teds, ace wraps and abdominal binder donned. Pt rolled L sidelying with steady assist, L sidelying>sitting with max assist with max cuing and encouragement to push to upright trunk with RUE. Pt completes bed>w/c on R via squat pivot with +2 assist with ongoing cuing for head/hips relationship and max assist to weight shift over to w/c. Pt propelled w/c around nurses station with mod assist progressing to intermittent bouts of supervision when given visual cue on floor to attend to, otherwise pt steering L 2/2 significant L inattention. Pt requires assistance to turn R into doorway 2/2 decreased ability to coordinate movements to turn w/c.  At end of session pt left sitting in w/c with chair alarm donned & all needs in reach.  Therapy Documentation Precautions:  Precautions Precautions: Fall Precaution Comments: L hemi, L inattention/field cut Restrictions Weight Bearing Restrictions: No   See Function Navigator for Current Functional Status.   Therapy/Group: Individual Therapy  [name] 07/23/2018, 3:36 PM

## 2018-07-24 ENCOUNTER — Inpatient Hospital Stay (HOSPITAL_COMMUNITY): Payer: Medicare Other | Admitting: Occupational Therapy

## 2018-07-24 ENCOUNTER — Inpatient Hospital Stay (HOSPITAL_COMMUNITY): Payer: Medicare Other

## 2018-07-24 ENCOUNTER — Inpatient Hospital Stay (HOSPITAL_COMMUNITY): Payer: Medicare Other | Admitting: Physical Therapy

## 2018-07-24 DIAGNOSIS — I480 Paroxysmal atrial fibrillation: Secondary | ICD-10-CM

## 2018-07-24 NOTE — Progress Notes (Signed)
Speech Language Pathology Daily Session Note  Patient Details  Name: [name] MRN: [id number] Date of Birth: 06/30/1946  Today's Date: 07/24/2018 SLP Individual Time: 1100-1130 SLP Individual Time Calculation (min): 30 min  Short Term Goals: Week 1: SLP Short Term Goal 1 (Week 1): Patient will demonstrate functional problem solving for mildly complex tasks with supervision verbal cues. SLP Short Term Goal 2 (Week 1): Patient will attend to/locate items in left field of enviornment during functional tasks with Min A verbal cues.  SLP Short Term Goal 3 (Week 1): Patient will recall new, daily information with Mod I.  SLP Short Term Goal 4 (Week 1): Patient will self-monitor and correct errors during functional tasks with supervision verbal cues.   Skilled Therapeutic Interventions: Skilled treatment session focused on cognitive goals. SLP facilitated session by administering the [place] Visual Cancellation Test-Single Stimuli. Patient missed 52 out of 105 occurences all of which were in her left visual field or midline. Patient located all stimuli in right visual field. Patient educated on results and education provided in regards to visual scanning strategies.  Patient transferred back to bed via the Stedy at end of session. Continue with current plan of care.      Function:   Cognition Comprehension Comprehension assist level: Follows basic conversation/direction with extra time/assistive device  Expression   Expression assist level: Expresses basic needs/ideas: With no assist  Social Interaction Social Interaction assist level: Interacts appropriately with others with medication or extra time (anti-anxiety, antidepressant).  Problem Solving Problem solving assist level: Solves basic 90% of the time/requires cueing < 10% of the time  Memory Memory assist level: Recognizes or recalls 75 - 89% of the time/requires cueing 10 - 24% of the time    Pain No/Denies Pain    Therapy/Group: Individual Therapy  Duwane Gewirtz 07/24/2018, 11:44 AM

## 2018-07-24 NOTE — Progress Notes (Signed)
Occupational Therapy Session Note  Patient Details  Name: [name] MRN: [id number] Date of Birth: 11/10/1946  Today's Date: 07/24/2018 OT Individual Time: 0800-0830 OT Individual Time Calculation (min): 30 min    Short Term Goals: Week 1:  OT Short Term Goal 1 (Week 1): Pt will complete LB bathing with max assist OT Short Term Goal 2 (Week 1): Pt will complete toilet transfers with max assist of 1 caregiver OT Short Term Goal 3 (Week 1): Pt will don pants with max assist of 1 caregiver      Skilled Therapeutic Interventions/Progress Updates:    Pt received in bed.  Previous therapist donned TEDS, ACE wraps and socks on pt.  Pt placed in supine and worked on tricep drops over head to elicit tricep tone.  Slight tone felt in tricep.  With A to support LLE for A/AROM pt worked on knee flex/ext, bridges 10x, and knee sways using active L hip adduction.  Pt's pants donned over L leg, pt placed R leg in and pulled over R hip and then she worked on a bridge for therapist to pull over L hip.  Rolled onto L side with S and then to sit with max A.  Nurse tech provided +2 A for max A squat pivot bed to chair.  In chair at sink, pt completed grooming with set up and UB dressing with mod A.  Pt positioned with lap tray and chair alarm set. All needs met.  Therapy Documentation Precautions:  Precautions Precautions: Fall Precaution Comments: L hemi, L inattention/field cut Restrictions Weight Bearing Restrictions: No    Vital Signs: Therapy Vitals Temp: 97.8 F (36.6 C) Pulse Rate: (!) 59 Resp: 18 BP: (!) 150/63 Patient Position (if appropriate): Lying Oxygen Therapy SpO2: 99 % O2 Device: Room Air Pain: Pain Assessment Pain Scale: 0-10 Pain Score: 5  Pain Location: Head Pain Descriptors / Indicators: Headache Pain Onset: On-going Pain Intervention(s): Medication (See eMAR) ADL:    See Function Navigator for Current Functional Status.   Therapy/Group: Individual  Therapy  [place] 07/24/2018, 8:41 AM

## 2018-07-24 NOTE — Progress Notes (Signed)
[name] is a 72 y.o. female 06-05-1946 [id number]  Subjective:  No new problems. Slept well. Feeling OK.  Complains of a headache every morning which is 5 out of 10 in intensity and is relieved with Tylenol  Objective: Vital signs in last 24 hours: Temp:  [97.7 F (36.5 C)-98.3 F (36.8 C)] 97.8 F (36.6 C) (09/07 1541) Pulse Rate:  [59-61] 60 (09/07 1541) Resp:  [16-18] 16 (09/07 1541) BP: (138-150)/(54-64) 138/54 (09/07 1541) SpO2:  [96 %-100 %] 96 % (09/07 1541) Weight change:  Last BM Date: 07/21/18  Intake/Output from previous day: 09/06 0701 - 09/07 0700 In: 780 [P.O.:780] Out: 3177 [Urine:3177] Last cbgs: CBG (last 3)  No results for input(s): GLUCAP in the last 72 hours.   Physical Exam General: No apparent distress   HEENT: not dry Lungs: Normal effort. Lungs clear to auscultation, no crackles or wheezes. Cardiovascular: Regular rate and rhythm, no edema Abdomen: S/NT/ND; BS(+) Musculoskeletal:  unchanged Neurological: No new neurological deficits Wounds: N/A    Skin: clear  Aging changes Mental state: Alert, oriented, cooperative    Lab Results: BMET    Component Value Date/Time   NA 136 07/20/2018 0613   K 4.3 07/20/2018 0613   CL 102 07/20/2018 0613   CO2 24 07/20/2018 0613   GLUCOSE 99 07/20/2018 0613   BUN 21 07/20/2018 0613   CREATININE 0.90 07/20/2018 0613   CALCIUM 9.6 07/20/2018 0613   GFRNONAA >60 07/20/2018 0613   GFRAA >60 07/20/2018 0613   CBC    Component Value Date/Time   WBC 10.8 (H) 07/20/2018 0613   RBC 4.81 07/20/2018 0613   HGB 14.2 07/20/2018 0613   HCT 42.8 07/20/2018 0613   PLT 231 07/20/2018 0613   MCV 89.0 07/20/2018 0613   MCH 29.5 07/20/2018 0613   MCHC 33.2 07/20/2018 0613   RDW 12.7 07/20/2018 0613   LYMPHSABS 1.5 07/20/2018 0613   MONOABS 1.0 07/20/2018 0613   EOSABS 0.5 07/20/2018 0613   BASOSABS 0.1 07/20/2018 0613    Studies/Results: No results found.  Medications: I have reviewed the  patient's current medications.  Assessment/Plan:   1.  Left-sided weakness with sensory deficits and left homonymous hemi-anopsia secondary to a large right occipital ICH.  CIR 2.  DVT prophylaxis with sequential compression devices 3.  Pain management.  Tylenol for headaches as needed 4.  Hypertension.  We have been monitoring the blood pressure 5.  PAF.  Monitoring heart rate for bradycardia.  She is on Tambocor and metoprolol. 6.  Constipation MiraLAX as needed and senna 7.  Urinary retention.  Will monitor voiding with PVR checks     Length of stay, days: 5  Walker Kehr , MD 07/24/2018, 6:43 PM

## 2018-07-24 NOTE — Plan of Care (Signed)
  Problem: RH BOWEL ELIMINATION Goal: RH STG MANAGE BOWEL WITH ASSISTANCE Description STG Manage Bowel with min Assistance.  Outcome: Not Progressing; constipation LBM 9/4; miralax given   Problem: RH BLADDER ELIMINATION Goal: RH STG MANAGE BLADDER WITH ASSISTANCE Description STG Manage Bladder With min Assistance  Outcome: Not Progressing' cath q 6   Problem: RH PAIN MANAGEMENT Goal: RH STG PAIN MANAGED AT OR BELOW PT'S PAIN GOAL Description < 3  Outcome: Not Progressing; c/o constant headache

## 2018-07-24 NOTE — Progress Notes (Signed)
Occupational Therapy Session Note  Patient Details  Name: [name] MRN: [id number] Date of Birth: Mar 15, 1946  Today's Date: 07/24/2018 OT Individual Time: 9292-4462 OT Individual Time Calculation (min): 45 min    Short Term Goals: Week 1:  OT Short Term Goal 1 (Week 1): Pt will complete LB bathing with max assist OT Short Term Goal 2 (Week 1): Pt will complete toilet transfers with max assist of 1 caregiver OT Short Term Goal 3 (Week 1): Pt will don pants with max assist of 1 caregiver  Skilled Therapeutic Interventions/Progress Updates:    1:1. Focus of session on L attention, self feeding and HOH A for LUE incorporation into self feeding. OT sets up breakfast tray with all items placed at midline/L of midline. Pt requires min Vc for locating items and OT provides HOH A for LUE to hold butter, jelly and grits cup while self feeding. Pt only spreads butter and jelly on R half of toast until OT cues. Pt vitals assessed without teds 140/55 and 147/45 with teds and aces. Sitting EOB deffered to low BP. LLE stretched for tone management. Exited session with pt seated in bed, call light in reach and exit alarm on  Therapy Documentation Precautions:  Precautions Precautions: Fall Precaution Comments: L hemi, L inattention/field cut Restrictions Weight Bearing Restrictions: No General:   Vital Signs: Therapy Vitals Temp: 97.8 F (36.6 C) Pulse Rate: (!) 59 Resp: 18 BP: (!) 150/63 Patient Position (if appropriate): Lying Oxygen Therapy SpO2: 99 % O2 Device: Room Air Pain: Pain Assessment Pain Scale: 0-10 Pain Score: 5  Pain Location: Head Pain Descriptors / Indicators: Headache Pain Onset: On-going Pain Intervention(s): Medication (See eMAR)  See Function Navigator for Current Functional Status.   Therapy/Group: Individual Therapy  Tonny Branch 07/24/2018, 7:44 AM

## 2018-07-24 NOTE — Progress Notes (Signed)
Occupational Therapy Session Note  Patient Details  Name: [name] MRN: [id number] Date of Birth: Feb 24, 1946  Today's Date: 07/24/2018 OT Individual Time: 5670-1410 OT Individual Time Calculation (min): 71 min    Short Term Goals: Week 1:  OT Short Term Goal 1 (Week 1): Pt will complete LB bathing with max assist OT Short Term Goal 2 (Week 1): Pt will complete toilet transfers with max assist of 1 caregiver OT Short Term Goal 3 (Week 1): Pt will don pants with max assist of 1 caregiver  Skilled Therapeutic Interventions/Progress Updates:    1:1. Pt agreeable to tx. Pt supine>sitting EOB with MAX A and completes squat pivots throughout session with MAX A of 2 in B directions with VC for hand placements and increased time/cuing for trunk flexion. Pt sits EOM to complete trunk control activites (modified sit ups 2x10 MAX A, lateral flexion onto forearm in WB position LUE-MOD A/ RUE-MIN A) to improve postural control with mirror for visual feedback. Pt plays game of chonnect 4 reaching to R to obtain checker and forced scanning L to see playing baord. Pt completes AAROM towel glides 2x10 forward/back, laterally, circles in B directions seated in w/c for WB into LUE. Exited session with pt seated in w/c, call light in reach and all needs met  Therapy Documentation Precautions:  Precautions Precautions: Fall Precaution Comments: L hemi, L inattention/field cut Restrictions Weight Bearing Restrictions: No General:  Pain: Pain Assessment Pain Scale: 0-10 Pain Score: 0-No pain Pain Type: Acute pain Pain Location: Head Pain Descriptors / Indicators: Aching Pain Frequency: Constant Pain Onset: On-going Pain Intervention(s): Medication (See eMAR)  See Function Navigator for Current Functional Status.   Therapy/Group: Individual Therapy  Tonny Branch 07/24/2018, 3:27 PM

## 2018-07-24 NOTE — Progress Notes (Signed)
Physical Therapy Session Note  Patient Details  Name: [name] MRN: [id number] Date of Birth: 03/21/1946  Today's Date: 07/24/2018 PT Individual Time: 0921-1016 PT Individual Time Calculation (min): 55 min   Short Term Goals: Week 1:  PT Short Term Goal 1 (Week 1): Pt will complete bed<>w/c transfers with max assist +1. PT Short Term Goal 2 (Week 1): Pt will propel w/c 50 ft with supervision. PT Short Term Goal 3 (Week 1): Pt will ambulate 25 ft with max assist +1. PT Short Term Goal 4 (Week 1): Pt will complete bed mobility with mod assist +1.  Skilled Therapeutic Interventions/Progress Updates:  Pt received in w/c & agreeable to tx. Pt reports 5/10 HA but reports being premedicated. Pt already with ted hose & ace wraps donned & therapist donned abdominal binder total assist. BP then assessed during session, see below. Therapist donned shoes total assist for time management (pt still with behaviors demonstrating & c/o pain when touching LUE/LLE). In gym pt transferred sit<>stand with max assist with cuing for anterior/R weight shifting to counter pt's pushing L instead of forward for sit>stand. While standing at parallel bar therapist provided max assist for standing balance 2/2 L lateral lean, as well as cuing for upright posture (trunk & head) and to maintain L foot flat on floor. Pt engaged in reaching outside of BOS to obtain horseshoes then place them on left with task focusing on balance & L attention. Pt propelled w/c gym>dayroom with R hemi technique and fluctuating supervision<>mod assist for steering with max cuing for L attention and L obstacle avoidance. Pt tolerated standing frame ~5 minutes + ~2 minutes while engaging in reaching outside of BOS to focus on BLE weight bearing, weight shifting L<>R, and L attention. Pt reports dizziness with standing that decreases with sitting break.  At end of session pt left sitting in w/c in room with alarm set & all needs in reach.   9:27  AM BP = 145/70 mmHg (RUE)  9:39 AM after standing BP = 159/59 mmHg (RUE), HR = 64 bpm  10:14 AM sitting in w/c at end of session BP = 140/68 mmHg (RUE), HR = 66 bpm  Therapy Documentation Precautions:  Precautions Precautions: Fall Precaution Comments: L hemi, L inattention/field cut Restrictions Weight Bearing Restrictions: No   See Function Navigator for Current Functional Status.   Therapy/Group: Individual Therapy  [name] 07/24/2018, 10:36 AM

## 2018-07-25 NOTE — Plan of Care (Signed)
  Problem: RH BOWEL ELIMINATION Goal: RH STG MANAGE BOWEL WITH ASSISTANCE Description STG Manage Bowel with min Assistance.  Outcome: Not Progressing; constipation. Laxatives given   Problem: RH BLADDER ELIMINATION Goal: RH STG MANAGE BLADDER WITH ASSISTANCE Description STG Manage Bladder With min Assistance  Outcome: Not Progressing; cath q 6   Problem: RH PAIN MANAGEMENT Goal: RH STG PAIN MANAGED AT OR BELOW PT'S PAIN GOAL Description < 3  Outcome: not progressing; constant headache

## 2018-07-25 NOTE — Progress Notes (Signed)
Patient was noted to be crying when nurse entered the room to give her night time medications. She expressed that she missed her husband who is deceased & how she really needed him. She also expressed her frustration with her present condition & physical limitations. She was trying to work her laptop & was not able to use it as she wanted to. We talked about her loneliness & she stated that she did not want to be a burden on her children. As she spoke, she stated that she could have asked one of them to stay over night with her. When this nurse left her, she had calmed down, but called numerous times afterwards for tylenol & medication for muscle spasms, which she did not have listed on her MAR. She had already taken her benadryl. She was offered her anxiety medication. Patient was noted scratching vigorously & moving around in bed a few times prior to then. She seemed very restless & uncomfortable. She agreed after told that she had nothing for muscle spasms. She is sleeping at this time, will continue to monitor. No acute distress noted.

## 2018-07-25 NOTE — Progress Notes (Signed)
[name] is a 72 y.o. female 02/11/1946 [id number]  Subjective: Headaches every morning.   No new problems. Slept well. Feeling OK.  Objective: Vital signs in last 24 hours: Temp:  [97.5 F (36.4 C)-97.8 F (36.6 C)] 97.7 F (36.5 C) (09/08 0434) Pulse Rate:  [54-67] 62 (09/08 0815) Resp:  [16-17] 17 (09/08 0434) BP: (134-140)/(44-54) 140/44 (09/08 0815) SpO2:  [96 %-98 %] 96 % (09/08 0815) Weight change:  Last BM Date: 07/21/18  Intake/Output from previous day: 09/07 0701 - 09/08 0700 In: 720 [P.O.:720] Out: 2300 [Urine:2300] Last cbgs: CBG (last 3)  No results for input(s): GLUCAP in the last 72 hours.   Physical Exam General: No apparent distress.  In bed HEENT: not dry Lungs: Normal effort. Lungs clear to auscultation, no crackles or wheezes. Cardiovascular: Regular rate and rhythm, no edema Abdomen: S/NT/ND; BS(+) Musculoskeletal:  unchanged Neurological: No new neurological deficits Wounds: N/A    Skin: clear  Aging changes Mental state: Alert, oriented, cooperative    Lab Results: BMET    Component Value Date/Time   NA 136 07/20/2018 0613   K 4.3 07/20/2018 0613   CL 102 07/20/2018 0613   CO2 24 07/20/2018 0613   GLUCOSE 99 07/20/2018 0613   BUN 21 07/20/2018 0613   CREATININE 0.90 07/20/2018 0613   CALCIUM 9.6 07/20/2018 0613   GFRNONAA >60 07/20/2018 0613   GFRAA >60 07/20/2018 0613   CBC    Component Value Date/Time   WBC 10.8 (H) 07/20/2018 0613   RBC 4.81 07/20/2018 0613   HGB 14.2 07/20/2018 0613   HCT 42.8 07/20/2018 0613   PLT 231 07/20/2018 0613   MCV 89.0 07/20/2018 0613   MCH 29.5 07/20/2018 0613   MCHC 33.2 07/20/2018 0613   RDW 12.7 07/20/2018 0613   LYMPHSABS 1.5 07/20/2018 0613   MONOABS 1.0 07/20/2018 0613   EOSABS 0.5 07/20/2018 0613   BASOSABS 0.1 07/20/2018 0613    Studies/Results: No results found.  Medications: I have reviewed the patient's current medications.  Assessment/Plan:    1.   Left-sided weakness with sensory deficits and left homonymous hemianopsia secondary to a large right occipital ICH.  Continue with CIR. 2.  DVT prophylaxis with sequential compression devices 3.  Pain management.  She continues to have daily headaches every morning.  Tylenol seems to help well.  Will continue with Tylenol 4.  Hypertension.  Monitoring blood pressure 5.  PAF.  Monitoring heart rate for bradycardia.  She is on Tambocor and metoprolol.  No anticoagulation due to intracranial bleeding recently 6.  Constipation.  On MiraLAX as needed.  Senna as needed 7.  Urinary retention.  Will monitor voiding with PVR checks     Length of stay, days: 6  Walker Kehr , MD 07/25/2018, 2:10 PM

## 2018-07-26 ENCOUNTER — Inpatient Hospital Stay (HOSPITAL_COMMUNITY): Payer: Medicare Other | Admitting: Physical Therapy

## 2018-07-26 ENCOUNTER — Inpatient Hospital Stay (HOSPITAL_COMMUNITY): Payer: Medicare Other | Admitting: Occupational Therapy

## 2018-07-26 ENCOUNTER — Inpatient Hospital Stay (HOSPITAL_COMMUNITY): Payer: Medicare Other

## 2018-07-26 LAB — CBC
HCT: 45.2 % (ref 36.0–46.0)
Hemoglobin: 15 g/dL (ref 12.0–15.0)
MCH: 29.6 pg (ref 26.0–34.0)
MCHC: 33.2 g/dL (ref 30.0–36.0)
MCV: 89.3 fL (ref 78.0–100.0)
PLATELETS: 283 10*3/uL (ref 150–400)
RBC: 5.06 MIL/uL (ref 3.87–5.11)
RDW: 12.5 % (ref 11.5–15.5)
WBC: 8.6 10*3/uL (ref 4.0–10.5)

## 2018-07-26 LAB — BASIC METABOLIC PANEL
Anion gap: 9 (ref 5–15)
BUN: 20 mg/dL (ref 8–23)
CALCIUM: 10.1 mg/dL (ref 8.9–10.3)
CHLORIDE: 102 mmol/L (ref 98–111)
CO2: 25 mmol/L (ref 22–32)
CREATININE: 0.97 mg/dL (ref 0.44–1.00)
GFR, EST NON AFRICAN AMERICAN: 58 mL/min — AB (ref >60)
Glucose, Bld: 107 mg/dL — ABNORMAL HIGH (ref 70–99)
Potassium: 4.4 mmol/L (ref 3.5–5.1)
SODIUM: 136 mmol/L (ref 135–145)

## 2018-07-26 NOTE — Plan of Care (Signed)
Nutrition Education Note  RD consulted for nutrition diet education.  Lipid Panel     Component Value Date/Time   CHOL 97 07/14/2018 0353   TRIG 110 07/14/2018 0353   HDL 26 (L) 07/14/2018 0353   CHOLHDL 3.7 07/14/2018 0353   VLDL 22 07/14/2018 0353   LDLCALC 49 07/14/2018 0353    RD provided "Heart Healthy Nutrition Therapy" handout from the Academy of [employer]. Reviewed patient's dietary recall. Provided examples on ways to decrease sodium and fat intake in diet. Discouraged intake of processed foods and use of salt shaker. Encouraged fresh fruits and vegetables as well as whole grain sources of carbohydrates to maximize fiber intake. Teach back method used.  Expect good compliance.  Body mass index is 31.56 kg/m. Pt meets criteria for class I obesity based on current BMI.  Current diet order is heart health, patient is consuming approximately 75-100% of meals at this time. Labs and medications reviewed. No further nutrition interventions warranted at this time. RD contact information provided. If additional nutrition issues arise, please re-consult RD.  Corrin Parker, MS, RD, LDN Pager # [phone] After hours/ weekend pager # [phone]

## 2018-07-26 NOTE — Progress Notes (Signed)
Speech Language Pathology Weekly Progress and Session Note  Patient Details  Name: [name] MRN: [id number] Date of Birth: May 16, 1946  Beginning of progress report period: July 19, 2018 End of progress report period: July 26, 2018  Today's Date: 07/26/2018 SLP Individual Time: 1140-1210 SLP Individual Time Calculation (min): 30 min  Short Term Goals: Week 1: SLP Short Term Goal 1 (Week 1): Patient will demonstrate functional problem solving for mildly complex tasks with supervision verbal cues. SLP Short Term Goal 1 - Progress (Week 1): Not met SLP Short Term Goal 2 (Week 1): Patient will attend to/locate items in left field of enviornment during functional tasks with Min A verbal cues.  SLP Short Term Goal 2 - Progress (Week 1): Not met SLP Short Term Goal 3 (Week 1): Patient will recall new, daily information with Mod I.  SLP Short Term Goal 3 - Progress (Week 1): Met SLP Short Term Goal 4 (Week 1): Patient will self-monitor and correct errors during functional tasks with supervision verbal cues.  SLP Short Term Goal 4 - Progress (Week 1): Not met    New Short Term Goals: Week 2: SLP Short Term Goal 1 (Week 2): Patient will demonstrate functional problem solving for mildly complex tasks with supervision verbal cues. SLP Short Term Goal 2 (Week 2): Patient will attend to/locate items in left field of enviornment during functional tasks with Min A verbal cues.  SLP Short Term Goal 3 (Week 2): Patient will self-monitor and correct errors during functional tasks with supervision verbal cues.   Weekly Progress Updates: Pt demonstrated poor progress meeting 1 out 4 goals due to medical issues with blood pressure and pain resulting in fatigue. Pt met recall goal at Mod I level for functional daily information. SLP aministered the [place] Visual Cancellation Test-Single Stimuli, missing 52 out of 105 occurences all of which were in her left visual field or midline. Pt  continues to require mod A verbal cues for left attention in functional task and mod-min A verbal cues for mildly complex problem solving and error monitoring. Pt would continue to benefit from skilled ST services in order to maximize functional independence and reduce burden of care.     Intensity: Minumum of 1-2 x/day, 30 to 90 minutes Frequency: 3 to 5 out of 7 days Duration/Length of Stay: 24-28 days  Treatment/Interventions: Cognitive remediation/compensation;Environmental controls;Internal/external aids;Therapeutic Activities;Patient/family education;Cueing hierarchy;Functional tasks   Daily Session  Skilled Therapeutic Interventions: Skilled ST services focused on cognitive skills. Pt requested to get in bed, howevere agreed to participate in therapy in chair following min A verbal encouragement. SLP facilitated recall of pervious ST therapy events, pt demonstrated recall of visual scanning score and today's events at Mod I. SLP facilitated visual scanning with cancellation of letters, pt required mod-min A verbal cues with errors only noted on left side. SLP facilitated problem solving and visual scanning with PEG design task, pt required max-mod A verbal cues for mildly complex design and mod-min A verbal cues for basic design, with errors noted in all field of vision. SLP provided education to always begin task focused on left prior to scanning over and locating broders to help define areas, pt stated understanding. SLP reviewed goal progress. Pt was left in room with call bell within reach and bed alarm set. Recommend to continue skilled ST services.      Function:   Eating Eating                 Cognition Comprehension  Comprehension assist level: Follows basic conversation/direction with extra time/assistive device  Expression   Expression assist level: Expresses basic needs/ideas: With no assist  Social Interaction Social Interaction assist level: Interacts appropriately  with others with medication or extra time (anti-anxiety, antidepressant).  Problem Solving Problem solving assist level: Solves basic 75 - 89% of the time/requires cueing 10 - 24% of the time;Solves basic 50 - 74% of the time/requires cueing 25 - 49% of the time  Memory Memory assist level: Complete Independence: No helper   General    Pain Pain Assessment Pain Score: 0-No pain  Therapy/Group: Individual Therapy  Syla Devoss  [facility] 07/26/2018, 12:52 PM

## 2018-07-26 NOTE — Progress Notes (Signed)
Occupational Therapy Session Note  Patient Details  Name: [name] MRN: [id number] Date of Birth: [birth year]  Today's Date: 07/26/2018 OT Individual Time: 1002-1100 OT Individual Time Calculation (min): 58 min    Short Term Goals: Week 1:  OT Short Term Goal 1 (Week 1): Pt will complete LB bathing with max assist OT Short Term Goal 2 (Week 1): Pt will complete toilet transfers with max assist of 1 caregiver OT Short Term Goal 3 (Week 1): Pt will don pants with max assist of 1 caregiver  Skilled Therapeutic Interventions/Progress Updates:    Treatment session with focus on ADL retraining and Lt attention during self-care tasks.  Pt received supine in bed reporting HA 8/10, however premedicated just before therapy session.  Engaged in LB bathing and dressing at bed level with focus on crossing leg over opposite knee to allow pt to wash lower legs and feet as well as to assist in donning pants.  Attempted bridging for pt to pull pants over hips, however required rolling and assistance to fully pull pants over hips.  Mod assist bed mobility with increased weight shift into midline sitting at EOB.  Completed squat pivot transfer to Rt with +2 to w/c.  Engaged in [place] bathing, dressing, and grooming seated in w/c at sink.  Mod cues to visually scan to Lt to obtain items on sink and when bathing UB.  Pt continues to demonstrate decreased awareness of LUE during self-care tasks and mobility.  Pt left upright in w/c with chair alarm on and half lap tray for LUE support.  Therapy Documentation Precautions:  Precautions Precautions: Fall Precaution Comments: L hemi, L inattention/field cut Restrictions Weight Bearing Restrictions: No Pain: Pain Assessment Pain Score: 8 Pain Type: Acute pain Pain Location: Head Pain Descriptors / Indicators: Aching;Headache Pain Intervention(s): Premedicated  See Function Navigator for Current Functional Status.   Therapy/Group: Individual  Therapy  [name] 07/26/2018, 12:16 PM

## 2018-07-26 NOTE — Progress Notes (Signed)
Subjective/Complaints:  No problems overnite but sad and tearful this am regarding difficulty with self feeding and tray set up, also c/o food choices  ROS:  Denies CP, SOB, N/V/D   Objective: Vital Signs: Blood pressure (!) 155/65, pulse (!) 57, temperature 98.1 F (36.7 C), temperature source Oral, resp. rate 17, height _0  (1.676 m), weight 88.7 kg, SpO2 97 %. No results found. Results for orders placed or performed during the hospital encounter of 07/19/18 (from the past 72 hour(s))  Basic metabolic panel     Status: Abnormal   Collection Time: 07/26/18  5:30 AM  Result Value Ref Range   Sodium 136 135 - 145 mmol/L   Potassium 4.4 3.5 - 5.1 mmol/L   Chloride 102 98 - 111 mmol/L   CO2 25 22 - 32 mmol/L   Glucose, Bld 107 (H) 70 - 99 mg/dL   BUN 20 8 - 23 mg/dL   Creatinine, Ser 0.97 0.44 - 1.00 mg/dL   Calcium 10.1 8.9 - 10.3 mg/dL   GFR calc non Af Amer 58 (L) >60 mL/min   GFR calc Af Amer >60 >60 mL/min    Comment: (NOTE) The eGFR has been calculated using the CKD EPI equation. This calculation has not been validated in all clinical situations. eGFR's persistently <60 mL/min signify possible Chronic Kidney Disease.    Anion gap 9 5 - 15    Comment: Performed at [facility] [address], [place] [address]  CBC     Status: None   Collection Time: 07/26/18  5:30 AM  Result Value Ref Range   WBC 8.6 4.0 - 10.5 K/uL   RBC 5.06 3.87 - 5.11 MIL/uL   Hemoglobin 15.0 12.0 - 15.0 g/dL   HCT 45.2 36.0 - 46.0 %   MCV 89.3 78.0 - 100.0 fL   MCH 29.6 26.0 - 34.0 pg   MCHC 33.2 30.0 - 36.0 g/dL   RDW 12.5 11.5 - 15.5 %   Platelets 283 150 - 400 K/uL    Comment: Performed at [facility], [place] [address] [place], [place] [address]     HEENT: normal Cardio: RRR and no murmur Resp: CTA B/L and unlabored GI: BS positive and NT, ND Extremity:  Pulses positive and No Edema Skin:   Intact Neuro: Flat, Abnormal Sensory absent sensation Left hand intact  LT sensaiton Left foot , Abnormal Motor 0/5 in LUE, trace Left hip add LLE, normal strength on the R side, Normal FMC and Inattention Musc/Skel:  Other no pain with UE or LE ROM Gen NAD   Assessment/Plan: 1. 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 Physiatrist is providing close team supervision and 24 hour management of active medical problems listed below. Physiatrist and rehab team continue to assess barriers to discharge/monitor patient progress toward functional and medical goals. FIM: Function - Bathing Position: Wheelchair/chair at sink Body parts bathed by patient: Left arm, Chest, Abdomen(UB only  today) Body parts bathed by helper: Right arm Assist Level: 2 helpers  Function- Upper Body Dressing/Undressing What is the patient wearing?: Pull over shirt/dress Bra - Perfomed by patient: Thread/unthread right bra strap Bra - Perfomed by helper: Thread/unthread left bra strap, Hook/unhook bra (pull down sports bra) Pull over shirt/dress - Perfomed by patient: Thread/unthread right sleeve, Put head through opening Pull over shirt/dress - Perfomed by helper: Thread/unthread left sleeve,  Pull shirt over trunk Assist Level: 2 helpers Function - Lower Body Dressing/Undressing What is the patient wearing?: Pants, Non-skid slipper socks Position: Bed Pants- Performed by patient: Thread/unthread right pants leg Pants- Performed by helper: Thread/unthread left pants leg, Pull pants up/down(pt A pulling pants over hips 50%) Non-skid slipper socks- Performed by helper: Don/doff right sock, Don/doff left sock Assist for lower body dressing: 2 Helpers  Function - Toileting Toileting steps completed by helper: Adjust clothing prior to toileting, Performs perineal hygiene, Adjust clothing after toileting Assist level: Two helpers  Function -  Air cabin crew transfer activity did not occur: Safety/medical concerns Toilet transfer assistive device: Bedside commode, Mechanical lift Mechanical lift: Stedy Assist level to bedside commode (at bedside): 2 helpers Assist level from bedside commode (at bedside): 2 helpers  Function - Chair/bed transfer Chair/bed transfer method: Squat pivot Chair/bed transfer assist level: 2 helpers Chair/bed transfer assistive device: Armrests Mechanical lift: Stedy Chair/bed transfer details: Manual facilitation for placement, Manual facilitation for weight shifting, Verbal cues for technique, Verbal cues for sequencing, Tactile cues for sequencing, Tactile cues for weight shifting, Tactile cues for posture, Tactile cues for placement  Function - Locomotion: Wheelchair Type: Manual Max wheelchair distance: 75 ft Assist Level: Moderate assistance (Pt 50 - 74%) Assist Level: Moderate assistance (Pt 50 - 74%) Wheel 150 feet activity did not occur: Safety/medical concerns Turns around,maneuvers to table,bed, and toilet,negotiates 3% grade,maneuvers on rugs and over doorsills: No Function - Locomotion: Ambulation Assistive device: Rail in hallway Max distance: 20 ft  Assist level: 2 helpers(max assist + w/c follow) Assist level: 2 helpers Walk 50 feet with 2 turns activity did not occur: Safety/medical concerns Walk 150 feet activity did not occur: Safety/medical concerns Walk 10 feet on uneven surfaces activity did not occur: Safety/medical concerns  Function - Comprehension Comprehension: Auditory Comprehension assist level: Follows basic conversation/direction with extra time/assistive device  Function - Expression Expression: Verbal Expression assist level: Expresses basic needs/ideas: With no assist  Function - Social Interaction Social Interaction assist level: Interacts appropriately with others with medication or extra time (anti-anxiety, antidepressant).  Function - Problem  Solving Problem solving assist level: Solves basic 90% of the time/requires cueing < 10% of the time  Function - Memory Memory assist level: Recognizes or recalls 75 - 89% of the time/requires cueing 10 - 24% of the time Patient normally able to recall (first 3 days only): Current season, Staff names and faces, That he or she is in a hospital, Location of own room  Medical Problem List and Plan: 1. Decline in functional status due to resultant left-sided weakness with sensory deficits and left homonymous  hemianopsia secondary to large Right occipitoparietal ICH CIR PT, OT, SLP 2.  DVT Prophylaxis/Anticoagulation: Mechanical: Sequential compression devices, below knee Bilateral lower extremities 3. Pain Management: Continue Tylenol as needed for headaches 4. Mood: LCSW to follow for evaluation and support, neuropsych to follow , on Zoloft 33m  5. Neuropsych: This patient is capable of making decisions on her own behalf. 6. Skin/Wound Care: Routine pressure relief measures 7. Fluids/Electrolytes/Nutrition: Monitor I's and O's.  Lytes and CBC nl 9/9, ask dietary to see for dietary, needs set up int sup for meals choices, 8. HTN: Monitor blood pressure twice daily Vitals:   07/26/18 0509 07/26/18 0510  BP: (!) 159/71 (!) 155/65  Pulse: (!) 57 (!) 57  Resp: 17   Temp: 98.1 F (36.7 C)   SpO2: 97%   9.  PAF: Monitor heart rate twice daily-- appears  in NSR on Tambocor and metoprolol twice daily.Bradycardia will reduce metoprolol to 12.75m BID and monitor, BP a little soft this am 10. Constipation: MiraLAX today.  May need to increase senna to twice daily 11.  Urinary retention: Remove Foley today and start voiding trial.  Monitor voiding with PVR checks. E coli >100K on cath urine  Macrodantin Sensitive cont x 3 more days    LOS (Days) 7 A FACE TO FACE EVALUATION WAS PERFORMED  ALuanna SalkKirsteins 07/26/2018, 8:16 AM

## 2018-07-26 NOTE — Progress Notes (Signed)
Physical Therapy Session Note  Patient Details  Name: [name] MRN: [id number] Date of Birth: February 13, 1946  Today's Date: 07/26/2018 PT Individual Time: 1305-1400 and 8850-2774 PT Individual Time Calculation (min): 55 min and 38 min   Short Term Goals: Week 1:  PT Short Term Goal 1 (Week 1): Pt will complete bed<>w/c transfers with max assist +1. PT Short Term Goal 2 (Week 1): Pt will propel w/c 50 ft with supervision. PT Short Term Goal 3 (Week 1): Pt will ambulate 25 ft with max assist +1. PT Short Term Goal 4 (Week 1): Pt will complete bed mobility with mod assist +1.  Skilled Therapeutic Interventions/Progress Updates:  Treatment 1: Pt received in bed, denying c/o pain. Pt transferred supine>L sidelying with min assist and cuing for technique. Pt was able to push to sitting with max assist & max assist to obtain sitting balance at EOB. Therapist donned abdominal binder total assist & BP assessed, see below (ted hose already donned). Pt completed bed>w/c transfer on R with max cuing for head/hips relationship and manual facilitation for weight shifting, which is a great improvement. Pt transported outside [facility] for increased mood and for encouragement. Pt propelled w/c outside with R hemi technique and mod assist with max cuing for L attention and turning to look at something with her eyes. Pt with difficulty turning w/c. Back on unit pt performed sit>stand with max assist with cuing for hand placement on armrests & anterior weight shifting. Pt ambulates 7 ft + 7 ft with rail and max assist + w/c follow for safety. Pt demonstrates increased extensor tone in LLE and therapist provides max assist to attempt to flex L hip/knee, pt also requires manual facilitation for weight shifting R and max cuing to look straight ahead as pt maintains R gaze preference. At end of session pt left sitting in w/c in room with chair alarm donned & all needs in reach.  Treatment 2: Pt received in w/c &  agreeable to tx, reporting 9/10 HA but states she is premedicated. Sitting in w/c BP = 134/63 mmHg (LUE), HR = 75 bpm with ted hose & abdominal binder donned. Pt propelled w/c room<>gym with supervision<>min assist with MAX cuing for obstacle avoidance & steering 2/2 L inattention. Pt transferred w/c<>mat table and w/c>bed with +2 assist via squat pivot with ongoing cuing for head/hips relationship, pushing with UE/LE, and manual facilitation for pivot portion. Pt sat EOM with close supervision<>steady assist for static sitting balance. Pt engaged in dynamic balance task requiring her to reach outside of BOS in all directions, even picking something up off floor, and returning to midline with use of mirror for visual feedback. Pt with difficulty demonstrating equal weight bearing on B hips, instead leaning to L requiring max cuing to shift weight to R. Pt reports fatigue at end of session & requests to return to bed. Pt requires max assist for sit>supine and repositioning shoulders in bed. Pt left in bed with alarm set & all needs within reach. Therapist provided encouragement regarding stroke recovery throughout session.   Therapy Documentation Precautions:  Precautions Precautions: Fall Precaution Comments: L hemi, L inattention/field cut Restrictions Weight Bearing Restrictions: No  Vital Signs: First session: BP = 122/59 mmHg (RUE, sitting)   See Function Navigator for Current Functional Status.   Therapy/Group: Individual Therapy  [name] 07/26/2018, 3:40 PM

## 2018-07-27 ENCOUNTER — Inpatient Hospital Stay (HOSPITAL_COMMUNITY): Payer: Medicare Other

## 2018-07-27 ENCOUNTER — Inpatient Hospital Stay (HOSPITAL_COMMUNITY): Payer: Medicare Other | Admitting: Occupational Therapy

## 2018-07-27 ENCOUNTER — Inpatient Hospital Stay (HOSPITAL_COMMUNITY): Payer: Medicare Other | Admitting: Physical Therapy

## 2018-07-27 MED ORDER — INFLUENZA VAC SPLIT HIGH-DOSE 0.5 ML IM SUSY
0.5000 mL | PREFILLED_SYRINGE | INTRAMUSCULAR | Status: AC
  Administered 2018-07-28: 0.5 mL via INTRAMUSCULAR
  Filled 2018-07-27: qty 0.5

## 2018-07-27 MED ORDER — TIZANIDINE HCL 2 MG PO TABS
2.0000 mg | ORAL_TABLET | Freq: Three times a day (TID) | ORAL | Status: DC
  Administered 2018-07-27 – 2018-08-03 (×22): 2 mg via ORAL
  Filled 2018-07-27 (×21): qty 1

## 2018-07-27 NOTE — Progress Notes (Signed)
Speech Language Pathology Daily Session Note  Patient Details  Name: [name] MRN: [id number] Date of Birth: Oct 06, 1946  Today's Date: 07/27/2018 SLP Individual Time: 1015-1100 SLP Individual Time Calculation (min): 45 min  Short Term Goals: Week 2: SLP Short Term Goal 1 (Week 2): Patient will demonstrate functional problem solving for mildly complex tasks with supervision verbal cues. SLP Short Term Goal 2 (Week 2): Patient will attend to/locate items in left field of enviornment during functional tasks with Min A verbal cues.  SLP Short Term Goal 3 (Week 2): Patient will self-monitor and correct errors during functional tasks with supervision verbal cues.   Skilled Therapeutic Interventions:Skilled ST services focused on cognitive skills. SLP facilitated mildly complex problem solving skills utilizing money management, pt demonstrated mod I for verbal problem solving, however required max-mod A verbal cues for functional due to impairments in working memory, visual scanning (midline and left) and organization. Pt required min A verbal cues for error awareness. SLP facilitated mildly complex problem solving with basic medication management requiring min A verbal cues. Pt was left in room with call bell within reach and chair alaram set.ST reccomends to continue skilled ST services.     Function:  Eating Eating                 Cognition Comprehension Comprehension assist level: Follows basic conversation/direction with extra time/assistive device  Expression   Expression assist level: Expresses basic needs/ideas: With no assist  Social Interaction Social Interaction assist level: Interacts appropriately with others with medication or extra time (anti-anxiety, antidepressant).  Problem Solving Problem solving assist level: Solves basic 75 - 89% of the time/requires cueing 10 - 24% of the time  Memory Memory assist level: Complete Independence: No helper    Pain Pain  Assessment Pain Scale: 0-10 Pain Score: 0-No pain Pain Type: Acute pain Pain Location: Head Pain Descriptors / Indicators: Headache Pain Frequency: Intermittent Pain Onset: On-going Patients Stated Pain Goal: 2 Pain Intervention(s): Medication (See eMAR)  Therapy/Group: Individual Therapy  [name]  [facility] 07/27/2018, 4:23 PM

## 2018-07-27 NOTE — Progress Notes (Signed)
Physical Therapy Weekly Progress Note  Patient Details  Name: [name] MRN: [id number] Date of Birth: 1946-04-12  Beginning of progress report period: July 20, 2018 End of progress report period: July 27, 2018  Today's Date: 07/27/2018  Patient has met 1 of 4 short term goals.  Pt is making slow progress towards LTG's but has been able to complete bed>w/c transfer with max assist on one occasion, otherwise she requires +2 assist. Pt has ambulated very short distances with rail in hallway & max assist + w/c follow & demonstrates increased extensor tone in LLE during gait, but once LLE in flexion during sitting pt with limiting flexor tone. Pt would benefit from continued skilled PT treatment to address L inattention, w/c mobility, transfers, sitting/standing balance, midline orientation, bed mobility and to increase independence with all functional mobility.   Patient continues to demonstrate the following deficits muscle weakness, decreased cardiorespiratoy endurance, decreased coordination, decreased visual perceptual skills, decreased attention to left and left side neglect, decreased awareness, decreased problem solving, decreased safety awareness and decreased memory, and decreased sitting balance, decreased standing balance, decreased postural control, hemiplegia and decreased balance strategies and therefore will continue to benefit from skilled PT intervention to increase functional independence with mobility.  Patient progressing toward long term goals..  Continue plan of care.  PT Short Term Goals Week 1:  PT Short Term Goal 1 (Week 1): Pt will complete bed<>w/c transfers with max assist +1. PT Short Term Goal 1 - Progress (Week 1): Met(met only on one occasion) PT Short Term Goal 2 (Week 1): Pt will propel w/c 50 ft with supervision. PT Short Term Goal 2 - Progress (Week 1): Progressing toward goal PT Short Term Goal 3 (Week 1): Pt will ambulate 25 ft with max assist  +1. PT Short Term Goal 3 - Progress (Week 1): Not met PT Short Term Goal 4 (Week 1): Pt will complete bed mobility with mod assist +1. PT Short Term Goal 4 - Progress (Week 1): Not met Week 2:  PT Short Term Goal 1 (Week 2): Pt will consistently complete bed<>w/c transfers with max assist +1. PT Short Term Goal 2 (Week 2): Pt will propel w/c 75 ft with supervision. PT Short Term Goal 3 (Week 2): Pt will complete bed mobility with mod assist.   Therapy Documentation Precautions:  Precautions Precautions: Fall Precaution Comments: L hemi, L inattention/field cut Restrictions Weight Bearing Restrictions: No   See Function Navigator for Current Functional Status.  Therapy/Group: Individual Therapy  [name] 07/27/2018, 7:54 AM

## 2018-07-27 NOTE — Progress Notes (Signed)
Subjective/Complaints:  Brighter and alert this am , no lability, feeling some sensation LLE  ROS:  Denies CP, SOB, N/V/D   Objective: Vital Signs: Blood pressure 130/72, pulse 62, temperature 98.9 F (37.2 C), temperature source Oral, resp. rate 16, height '5\' 6"'  (1.676 m), weight 88.7 kg, SpO2 97 %. No results found. Results for orders placed or performed during the hospital encounter of 07/19/18 (from the past 72 hour(s))  Basic metabolic panel     Status: Abnormal   Collection Time: 07/26/18  5:30 AM  Result Value Ref Range   Sodium 136 135 - 145 mmol/L   Potassium 4.4 3.5 - 5.1 mmol/L   Chloride 102 98 - 111 mmol/L   CO2 25 22 - 32 mmol/L   Glucose, Bld 107 (H) 70 - 99 mg/dL   BUN 20 8 - 23 mg/dL   Creatinine, Ser 0.97 0.44 - 1.00 mg/dL   Calcium 10.1 8.9 - 10.3 mg/dL   GFR calc non Af Amer 58 (L) >60 mL/min   GFR calc Af Amer >60 >60 mL/min    Comment: (NOTE) The eGFR has been calculated using the CKD EPI equation. This calculation has not been validated in all clinical situations. eGFR's persistently <60 mL/min signify possible Chronic Kidney Disease.    Anion gap 9 5 - 15    Comment: Performed at [facility] [address], [place] [address]  CBC     Status: None   Collection Time: 07/26/18  5:30 AM  Result Value Ref Range   WBC 8.6 4.0 - 10.5 K/uL   RBC 5.06 3.87 - 5.11 MIL/uL   Hemoglobin 15.0 12.0 - 15.0 g/dL   HCT 45.2 36.0 - 46.0 %   MCV 89.3 78.0 - 100.0 fL   MCH 29.6 26.0 - 34.0 pg   MCHC 33.2 30.0 - 36.0 g/dL   RDW 12.5 11.5 - 15.5 %   Platelets 283 150 - 400 K/uL    Comment: Performed at [facility], [place] [address] [place], [place] [address]     HEENT: normal Cardio: RRR and no murmur Resp: CTA B/L and unlabored GI: BS positive and NT, ND Extremity:  Pulses positive and No Edema Skin:   Intact Neuro: Flat, Abnormal Sensory absent sensation Left hand intact LT sensaiton Left foot , Abnormal Motor 0/5 in LUE, trace Left hip  add LLE, normal strength on the R side, Normal FMC and Inattention, tone MAS 3 in Left hamstring, 1 beat clonus at left ankle Musc/Skel:  Other no pain with UE or LE ROM Gen NAD   Assessment/Plan: 1. 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 Physiatrist is providing close team supervision and 24 hour management of active medical problems listed below. Physiatrist and rehab team continue to assess barriers to discharge/monitor patient progress toward functional and medical goals. FIM: Function - Bathing Position: Bed Body parts bathed by patient: Left arm, Chest, Abdomen, Front perineal area, Right upper leg, Left upper leg, Right lower leg, Left lower leg Body parts bathed by helper: Right arm, Buttocks, Back Assist Level: (Mod assist)  Function- Upper Body Dressing/Undressing What is the patient wearing?: Pull over shirt/dress, Bra Bra - Perfomed by patient: Thread/unthread right bra strap Bra - Perfomed by helper: Thread/unthread left bra strap, Hook/unhook bra (pull down sports bra) Pull over shirt/dress - Perfomed by patient: Thread/unthread right sleeve, Put  head through opening Pull over shirt/dress - Perfomed by helper: Thread/unthread left sleeve, Pull shirt over trunk Assist Level: (Max assist) Function - Lower Body Dressing/Undressing What is the patient wearing?: Pants, Non-skid slipper socks Position: Bed Pants- Performed by patient: Thread/unthread right pants leg Pants- Performed by helper: Thread/unthread left pants leg, Pull pants up/down Non-skid slipper socks- Performed by helper: Don/doff right sock, Don/doff left sock Assist for footwear: Maximal assist Assist for lower body dressing: (Total assist)  Function - Toileting Toileting steps completed by helper: Adjust clothing prior to toileting, Performs perineal hygiene,  Adjust clothing after toileting Assist level: Two helpers  Function - Air cabin crew transfer activity did not occur: Safety/medical concerns Toilet transfer assistive device: Bedside commode, Mechanical lift Mechanical lift: Stedy Assist level to bedside commode (at bedside): 2 helpers Assist level from bedside commode (at bedside): 2 helpers  Function - Chair/bed transfer Chair/bed transfer method: Squat pivot Chair/bed transfer assist level: 2 helpers Chair/bed transfer assistive device: Armrests Mechanical lift: Stedy Chair/bed transfer details: Manual facilitation for placement, Manual facilitation for weight shifting, Verbal cues for technique, Verbal cues for sequencing, Tactile cues for sequencing, Tactile cues for weight shifting, Tactile cues for posture, Tactile cues for placement  Function - Locomotion: Wheelchair Will patient use wheelchair at discharge?: Yes Type: Manual Max wheelchair distance: 150 ft  Assist Level: Moderate assistance (Pt 50 - 74%) Assist Level: Moderate assistance (Pt 50 - 74%) Wheel 150 feet activity did not occur: Safety/medical concerns Assist Level: Moderate assistance (Pt 50 - 74%) Turns around,maneuvers to table,bed, and toilet,negotiates 3% grade,maneuvers on rugs and over doorsills: No Function - Locomotion: Ambulation Assistive device: Rail in hallway Max distance: 7 ft Assist level: 2 helpers(max assist + w/c follow) Assist level: 2 helpers Walk 50 feet with 2 turns activity did not occur: Safety/medical concerns Walk 150 feet activity did not occur: Safety/medical concerns Walk 10 feet on uneven surfaces activity did not occur: Safety/medical concerns  Function - Comprehension Comprehension: Auditory Comprehension assist level: Follows basic conversation/direction with extra time/assistive device  Function - Expression Expression: Verbal Expression assist level: Expresses basic needs/ideas: With no assist  Function -  Social Interaction Social Interaction assist level: Interacts appropriately with others with medication or extra time (anti-anxiety, antidepressant).  Function - Problem Solving Problem solving assist level: Solves basic 75 - 89% of the time/requires cueing 10 - 24% of the time, Solves basic 50 - 74% of the time/requires cueing 25 - 49% of the time  Function - Memory Memory assist level: Complete Independence: No helper Patient normally able to recall (first 3 days only): Current season, Staff names and faces, That he or she is in a hospital, Location of own room  Medical Problem List and Plan: 1. Decline in functional status due to resultant left-sided weakness with sensory deficits and left homonymous  hemianopsia secondary to large Right occipitoparietal ICH CIR PT, OT, SLP 2.  DVT Prophylaxis/Anticoagulation: Mechanical: Sequential compression devices, below knee Bilateral lower extremities 3. Pain Management: Continue Tylenol as needed for headaches 4. Mood: LCSW to follow for evaluation and support, neuropsych to follow , on Zoloft 69m Slept well more upbeat today 5. Neuropsych: This patient is capable of making decisions on her own behalf. 6. Skin/Wound Care: Routine pressure relief measures 7. Fluids/Electrolytes/Nutrition: Monitor I's and O's.  Lytes and CBC nl 9/9, ask dietary to see for dietary, needs set up int sup for meals choices, 8. HTN: Monitor blood pressure twice daily Vitals:   07/26/18 2050 07/27/18 09675  BP: (!) 145/53 130/72  Pulse: 79 62  Resp: 18 16  Temp: 98 F (36.7 C) 98.9 F (37.2 C)  SpO2: 94% 97%  9.  PAF: Monitor heart rate twice daily-- appears in NSR on Tambocor and metoprolol twice daily.Bradycardia improved metoprolol to 12.77m BID and monitor, 10. Constipation: MiraLAX today.  May need to increase senna to twice daily 11.  Urinary retention: Remove Foley today and start voiding trial.  Monitor voiding with PVR checks. E coli >100K on cath urine   Macrodantin Sensitive cont x 3 more days    LOS (Days) 8 A FACE TO FACE EVALUATION WAS PERFORMED  ACharlett Blake9/08/2018, 8:40 AM

## 2018-07-27 NOTE — Progress Notes (Signed)
Occupational Therapy Weekly Progress Note  Patient Details  Name: ILLIANA LOSURDO MRN: [id number] Date of Birth: [birth year]  Beginning of progress report period: July 20, 2018 End of progress report period: July 27, 2018  Today's Date: 07/27/2018 OT Individual Time: 0900-1000 OT Individual Time Calculation (min): 60 min    Patient has met 1 of 3 short term goals.  Pt is making slow progress towards goals. Pt participation is being limited due to HA and fatigue.  Pt is demonstrating improved participation and decreased dependence with bathing LB at bed level with figure 4 positioning and rolling, however continues to require +2 for squat pivot transfers.  Pt continues to demonstrate Lt homonymous hemianopsia with inattention to Lt visual field and Lt body.  Pt continues to demonstrate flaccid LUE.  Patient continues to demonstrate the following deficits: muscle weakness, impaired timing and sequencing, abnormal tone and unbalanced muscle activation, decreased visual perceptual skills and hemianopsia, decreased attention to left, decreased awareness, decreased problem solving, decreased safety awareness and decreased memory and decreased sitting balance, decreased standing balance, decreased postural control, hemiplegia and decreased balance strategies and therefore will continue to benefit from skilled OT intervention to enhance overall performance with BADL and Reduce care partner burden.  Patient progressing toward long term goals..  Continue plan of care.  OT Short Term Goals Week 1:  OT Short Term Goal 1 (Week 1): Pt will complete LB bathing with max assist OT Short Term Goal 1 - Progress (Week 1): Met OT Short Term Goal 2 (Week 1): Pt will complete toilet transfers with max assist of 1 caregiver OT Short Term Goal 2 - Progress (Week 1): Progressing toward goal OT Short Term Goal 3 (Week 1): Pt will don pants with max assist of 1 caregiver OT Short Term Goal 3 - Progress (Week  1): Progressing toward goal Week 2:  OT Short Term Goal 1 (Week 2): Pt will complete toilet transfers with max assist of 1 caregiver OT Short Term Goal 2 (Week 2): Pt will don pants with max assist of 1 caregiver OT Short Term Goal 3 (Week 2): Pt will complete bathing with min assist OT Short Term Goal 4 (Week 2): Pt will scan to Lt to obtain items during self-care tasks with min cues  Skilled Therapeutic Interventions/Progress Updates:    Treatment session with focus on ADL retraining, midline orientation, and Lt attention all during self-care tasks.  Pt received supine in bed reporting feeling like she was in A-fib.  Notified RN who assessed HR and provided pt with morning meds.  Deferred shower at this time due to pt reporting not up to it.  Engaged in LB bathing and dressing at bed level with focus on increased ability to reach towards feet for bathing and donning pants.  Therapist assisting with positioning LLE across Rt leg to wash lower leg and don pants.  Max assist bed mobility and +2 squat pivot to Rt with pt demonstrating decreased initiation during transfer.  Engaged in [place] bathing, dressing, and grooming seated in w/c at sink with focus on visual scanning to Lt to obtain items as well as to attend to LUE during bathing and dressing.  Pt left upright in w/c with half lap tray for positioning and chair alarm on and all needs in reach.  Therapy Documentation Precautions:  Precautions Precautions: Fall Precaution Comments: L hemi, L inattention/field cut Restrictions Weight Bearing Restrictions: No General:   Vital Signs: Therapy Vitals Temp: 98.9 F (37.2 C) Temp Source:  Oral Pulse Rate: 62 Resp: 16 BP: 130/72 Patient Position (if appropriate): Lying Oxygen Therapy SpO2: 97 % O2 Device: Room Air Pain:  Pt with c/o headache.  RN notified and provided meds during session.  See Function Navigator for Current Functional Status.   Therapy/Group: Individual Therapy  [name] 07/27/2018, 7:58 AM

## 2018-07-27 NOTE — Progress Notes (Signed)
Physical Therapy Session Note  Patient Details  Name: [name] MRN: [id number] Date of Birth: 21-Jun-1946  Today's Date: 07/27/2018 PT Individual Time: 1500-1530 PT Individual Time Calculation (min): 30 min   Short Term Goals: Week 2:  PT Short Term Goal 1 (Week 2): Pt will consistently complete bed<>w/c transfers with max assist +1. PT Short Term Goal 2 (Week 2): Pt will propel w/c 75 ft with supervision. PT Short Term Goal 3 (Week 2): Pt will complete bed mobility with mod assist.  Skilled Therapeutic Interventions/Progress Updates:    c/o HA pain but does not rate, RN notified for medication.  Session focus on LE stretching, tone management, and NMR.  PT provided PROM 3x30 seconds stretch for L heel cords, hamstrings, glutes, and hip flexors.  2x10 reps single limb support bridges on LLE with facilitation for glute activation.  Attempted gravity modified LUE IR/ER but no active movement noted.  Pt repositioned in bed and positioned with call bell in reach and needs met.   Therapy Documentation Precautions:  Precautions Precautions: Fall Precaution Comments: L hemi, L inattention/field cut Restrictions Weight Bearing Restrictions: No   See Function Navigator for Current Functional Status.   Therapy/Group: Individual Therapy  Michel Santee 07/27/2018, 4:42 PM

## 2018-07-27 NOTE — Progress Notes (Addendum)
Physical Therapy Session Note  Patient Details  Name: [name] MRN: [id number] Date of Birth: 01/31/1946  Today's Date: 07/27/2018 PT Individual Time: 1100-1158 PT Individual Time Calculation (min): 58 min   Short Term Goals: Week 2:  PT Short Term Goal 1 (Week 2): Pt will consistently complete bed<>w/c transfers with max assist +1. PT Short Term Goal 2 (Week 2): Pt will propel w/c 75 ft with supervision. PT Short Term Goal 3 (Week 2): Pt will complete bed mobility with mod assist.  Skilled Therapeutic Interventions/Progress Updates:  Pt received in w/c & agreeable to tx, reporting HA during session but states she is premedicated. Pt propels w/c room>dayroom with supervision with R hemi technique. Pt stood in standing frame x 7 minutes + 3 minutes while engaging in reaching task focusing on weight shifting L<>R, core strengthening & midline orientation. Pt required seated rest break between trials and after 1st trial reports feeling weak & BP assessed & abdominal binder donned (ted hose already donned). Transitioned to dynavision from w/c level with task focusing on L attention and anterior weight shifting with pt requiring cuing to attend to L as she will only look midline initially. Pt with reducing reaction time to L side of board as task progressed. Therapist continues to educate pt on need to retrain herself and consciously look L during tasks. Transitioned to focusing on w/c mobility and L attention by having pt propel w/c between cones with pt unable to attend to L in a functional context, frequently hitting cones and/or therapist 2/2 L inattention and requiring MAX cuing to correct. At end of session pt left sitting in w/c in room with NT present.   Addendum: Placed towel roll under L side of w/c cushion to promote increased weight shifting to R and elongate L side of trunk while shortening R side for improved postural alignment.  Therapy Documentation Precautions:   Precautions Precautions: Fall Precaution Comments: L hemi, L inattention/field cut Restrictions Weight Bearing Restrictions: No  Vital Signs: Therapy Vitals Pulse Rate: 62 BP: 106/76 Patient Position (if appropriate): Sitting   See Function Navigator for Current Functional Status.   Therapy/Group: Individual Therapy  [name] 07/27/2018, 2:15 PM

## 2018-07-28 ENCOUNTER — Inpatient Hospital Stay (HOSPITAL_COMMUNITY): Payer: Medicare Other

## 2018-07-28 ENCOUNTER — Inpatient Hospital Stay (HOSPITAL_COMMUNITY): Payer: Medicare Other | Admitting: Occupational Therapy

## 2018-07-28 ENCOUNTER — Inpatient Hospital Stay (HOSPITAL_COMMUNITY): Payer: Medicare Other | Admitting: Physical Therapy

## 2018-07-28 MED ORDER — BETHANECHOL CHLORIDE 10 MG PO TABS
10.0000 mg | ORAL_TABLET | Freq: Three times a day (TID) | ORAL | Status: DC
  Administered 2018-07-28 – 2018-08-07 (×30): 10 mg via ORAL
  Filled 2018-07-28 (×30): qty 1

## 2018-07-28 NOTE — Progress Notes (Signed)
Physical Therapy Session Note  Patient Details  Name: [name] MRN: [id number] Date of Birth: Dec 01, 1945  Today's Date: 07/28/2018 PT Individual Time: 1333-1430 PT Individual Time Calculation (min): 57 min   Short Term Goals: Week 2:  PT Short Term Goal 1 (Week 2): Pt will consistently complete bed<>w/c transfers with max assist +1. PT Short Term Goal 2 (Week 2): Pt will propel w/c 75 ft with supervision. PT Short Term Goal 3 (Week 2): Pt will complete bed mobility with mod assist.  Skilled Therapeutic Interventions/Progress Updates:  Pt received in w/c & agreeable to tx. Pt propelled w/c room>gym with max assist to navigate room & MAX cuing for L attention, but min assist in hallway in open, controlled environment with ongoing cuing for L attention. Pt completes squat pivot transfers w/c<>mat table initially with +1 assist but ultimately requires +2 assist 2/2 inability to clear buttocks and with inability to control trunk with L/anterior weight shifting. While sitting EOM pt engaged in lateral scooting with focus on head/hips relationship, anterior weight shifting and pushing through LE to clear buttocks from seat with pt able to return demonstrate ~50% of the time with max cuing. Pt does report pain when attempting to put pressure through LLE & repositioning completed. Transitioned to standing in stedy & pt engaging in reaching outside of BOS in all directions to obtain objects with mod/max assist for standing balance and weight shifting L<>R with therapist maintaining LUE support on stedy bar for increased weight bearing. Pt continues to demonstrate impaired attention to L & neglect to L side of body despite ongoing education & cuing. Pt becoming tearful during session, reporting she feels "disgusted with myself" with therapist providing max encouragement and support. At end of session pt returned to bed via stedy lift and max assist for sit>supine. Pt left in bed with alarm set & all  needs in reach.   Therapy Documentation Precautions:  Precautions Precautions: Fall Precaution Comments: L hemi, L inattention/field cut Restrictions Weight Bearing Restrictions: No  Pain: Pt with c/o back pain & HA - RN made aware.   See Function Navigator for Current Functional Status.   Therapy/Group: Individual Therapy  [name] 07/28/2018, 3:59 PM

## 2018-07-28 NOTE — Progress Notes (Signed)
Occupational Therapy Session Note  Patient Details  Name: [name] MRN: [id number] Date of Birth: May 08, 1946  Today's Date: 07/28/2018 OT Individual Time: 5809-9833 OT Individual Time Calculation (min): 72 min    Short Term Goals: Week 2:  OT Short Term Goal 1 (Week 2): Pt will complete toilet transfers with max assist of 1 caregiver OT Short Term Goal 2 (Week 2): Pt will don pants with max assist of 1 caregiver OT Short Term Goal 3 (Week 2): Pt will complete bathing with min assist OT Short Term Goal 4 (Week 2): Pt will scan to Lt to obtain items during self-care tasks with min cues  Skilled Therapeutic Interventions/Progress Updates:    Treatment session with focus on ADL retraining with focus on dynamic sitting balance and sequencing with bathing at shower level.  Completed bed mobility max assist sidelying to sitting and use of Stedy +2 for sit > stand and transfer into room shower.  Therapist provided constant min assist for sitting balance during bathing due to tendency to lean forward with fatigue and drift Lt due to Lt inattention.  Pt would benefit from long handled sponge to wash lower legs in shower.  Incorporated lateral leans for pt to wash perineal area and buttocks in sitting.  Completed LB dressing at sit > stand level with use of Stedy with pt continuing to require +2 to facilitate anterior weight shift for sit > stand. Therapist providing tactile cues to facilitate upright standing posture while also assisting with pulling pants over hips.  Pt completed grooming tasks in partial stand in [place] with min assist for balance as pt with tendency to lean Lt in unsupported positions.  Pt left upright in recliner with all needs in reach and chair alarm on.  Therapy Documentation Precautions:  Precautions Precautions: Fall Precaution Comments: L hemi, L inattention/field cut Restrictions Weight Bearing Restrictions: No General:   Vital Signs: Therapy Vitals Pulse  Rate: 79 BP: (!) 144/42 Pain: Pt reports headache rated 4/10.  RN aware.  See Function Navigator for Current Functional Status.   Therapy/Group: Individual Therapy  [name] 07/28/2018, 12:26 PM

## 2018-07-28 NOTE — Progress Notes (Signed)
Speech Language Pathology Daily Session Note  Patient Details  Name: [name] MRN: [id number] Date of Birth: 11/07/1946  Today's Date: 07/28/2018 SLP Individual Time: 0805-0900 SLP Individual Time Calculation (min): 55 min  Short Term Goals: Week 2: SLP Short Term Goal 1 (Week 2): Patient will demonstrate functional problem solving for mildly complex tasks with supervision verbal cues. SLP Short Term Goal 2 (Week 2): Patient will attend to/locate items in left field of enviornment during functional tasks with Min A verbal cues.  SLP Short Term Goal 3 (Week 2): Patient will self-monitor and correct errors during functional tasks with supervision verbal cues.   Skilled Therapeutic Interventions:Skilled ST services focused on cognitive skills. SLP rectrived breakfast from kitchen, tray wa snot delivered. Pt required set up assist and mod-min A verba cues to locate items on far left field during PO consumption. SLP facilitated semi-complex problem solving skills utilizing functional mental math questions, pt required supervision A verbal cues and use of compensatory written aid to assist working memory. SLP facilitated semi-complex problem solving, error awareness and scanning skills utilizing complex PEG design pt required mod A verbal cues for problem solving/errror correction and min-mod A verbal cues for scanning to left visual field. Pt was left in room with call bell within reach and bed alaram set.ST reccomends to continue skilled ST services.       Function:  Eating Eating                 Cognition Comprehension Comprehension assist level: Follows basic conversation/direction with extra time/assistive device  Expression   Expression assist level: Expresses basic needs/ideas: With no assist  Social Interaction Social Interaction assist level: Interacts appropriately with others with medication or extra time (anti-anxiety, antidepressant).  Problem Solving Problem  solving assist level: Solves basic problems with no assist  Memory Memory assist level: Complete Independence: No helper    Pain Pain Assessment Pain Scale: 0-10 Pain Score: 0-No pain Pain Type: Acute pain Pain Location: Head Pain Orientation: Anterior Pain Descriptors / Indicators: Aching;Dull;Headache Pain Frequency: Occasional Pain Onset: Gradual Pain Intervention(s): Medication (See eMAR)  Therapy/Group: Individual Therapy  Talah Cookston  [facility] 07/28/2018, 12:40 PM

## 2018-07-28 NOTE — Patient Care Conference (Signed)
Inpatient RehabilitationTeam Conference and Plan of Care Update Date: 07/28/2018   Time: 10:30 AM    Patient Name: [name]      Medical Record Number: [id number]  Date of Birth: [birth year] Sex: Female         Room/Bed: 4W09C/4W09C-01 Payor Info: Payor: MEDICARE / Plan: MEDICARE PART A AND B / Product Type: *No Product type* /    Admitting Diagnosis: iNTRACRANIAL HEMMORHAGE  Admit Date/Time:  07/19/2018  1:40 PM Admission Comments: No comment available   Primary Diagnosis:  <principal problem not specified> Principal Problem: <principal problem not specified>  Patient Active Problem List   Diagnosis Date Noted  . Current mild episode of major depressive disorder ([place])   . Intracerebral hemorrhage ([place]) 07/19/2018  . Acute CVA (cerebrovascular accident) ([place])   . Benign essential HTN   . Urinary retention   . Constipation   . ICH (intracerebral hemorrhage) ([place]) 07/13/2018  . Hypertension 07/06/2012  . Paroxysmal atrial fibrillation ([place]) 11/27/2011  . DIARRHEA, ACUTE 05/31/2009  . GERD 03/06/2009  . BACK PAIN 03/06/2009  . ANXIETY 06/12/2007  . DEPRESSION 06/12/2007  . HEADACHE 06/12/2007    Expected Discharge Date: Expected Discharge Date: 08/14/18  Team Members Present: Physician leading conference: Dr. Alysia Penna Social Worker Present: Ovidio Kin, LCSW Nurse Present: Benjie Karvonen, RN PT Present: Leavy Cella, PT OT Present: [name], OT SLP Present: Charolett Bumpers, SLP PPS Coordinator present : Ileana Ladd, PT     Current Status/Progress Goal Weekly Team Focus  Medical   heavy assist level, urinary retention  bladder emptying vs ICP teaching for caregiver  cont PT< OT, asess home envirnment barriers   Bowel/Bladder   Continent of bowel; I&O cath q4-6H; LBM 07/27/18  Remain continent of bowel, regain bladder function  Assess bowel and bladder needs q shift and PRN   Swallow/Nutrition/ Hydration             ADL's   +2 for ADLs, Lt homonymous  hemianopsia, Lt lean in dynamic sitting, flaccid LUE.  Max assist LB dressing at bed level  min assist, downgraded transfers to mod assist  ADL retraining, dynamic sitting balance, transfers, sit > stand, LUE NMR   Mobility   +2 assist for transfers, max assist + w/c follow for safety for very short distance gait with rail, min<>mod assist w/c mobility, L inattention  supervision w/c mobility, min<>mod assist transfers, mod assist stair negotiation for home entry  midline orientation, sitting/standing balance, L NMR, transfers, L attention, gait, endurance, strengthening, w/c mobility   Communication             Safety/Cognition/ Behavioral Observations  Min- supervision A problem solving, Mod-min A scaning midline/left field  Mod I for complex  left inattention, complex problen solving and error awareness   Pain   C/o headache; took Tylenol twice during shift  pain </=3/10  assess pain q shift and PRN and medicate as needed   Skin   bilat bruising to arms, abdomen, and coccyx; being treated with barrier cream  prevent infection and further breakdown  assess skin q shift and PRN      *See Care Plan and progress notes for long and short-term goals.     Barriers to Discharge  Current Status/Progress Possible Resolutions Date Resolved   Physician    Inaccessible home environment;Decreased caregiver support;Medical stability     slow progress with mobility, left neglect  Cont to focus on Left sided awareness      Nursing  PT  Inaccessible home environment;Home environment access/layout;Lack of/limited family support  pt has multiple steps to enter house & may need ramp, unsure if family can provide 24 hr supervision              OT                  SLP                SW                Discharge Planning/Teaching Needs:  Family here daily and very committed to pt and taking her home. Aware she will need 24 hr physical care.      Team Discussion:  Progressing slowly  toward her goals. Nursing having to I & O caths not voiding. L-inattention and attention deficits hinder her. May need to downgrade goals to mod assist level due to slow progress. Continent of bowel. Still using stedy for transfers. Pt will be much care at discharge. Have family come in for observation in therapies.  Revisions to Treatment Plan:  Ques downgrade goals to mod assist level. DC 9/28    Continued Need for Acute Rehabilitation Level of Care: The patient requires daily medical management by a physician with specialized training in physical medicine and rehabilitation for the following conditions: Daily direction of a multidisciplinary physical rehabilitation program to ensure safe treatment while eliciting the highest outcome that is of practical value to the patient.: Yes Daily medical management of patient stability for increased activity during participation in an intensive rehabilitation regime.: Yes Daily analysis of laboratory values and/or radiology reports with any subsequent need for medication adjustment of medical intervention for : Neurological problems;Blood pressure problems   I attest that I was present, lead the team conference, and concur with the assessment and plan of the team.   Elease Hashimoto 07/28/2018, 1:50 PM

## 2018-07-28 NOTE — Progress Notes (Signed)
Social Work  Shalom Ware, Eliezer Champagne  Social Worker  Physical Medicine and Rehabilitation  Patient Care Conference  Cosign Needed  Date of Service:  07/28/2018  1:50 PM          Cosign Needed        Show:Clear all [x] Manual[x] Template[] Copied  Added by: [x] Rashida Ladouceur, Gardiner Rhyme, LCSW  [] Hover for details Inpatient RehabilitationTeam Conference and Plan of Care Update Date: 07/28/2018   Time: 10:30 AM      Patient Name: [name]      Medical Record Guerra: [id number]  Date of Birth: [birth year] Sex: Female         Room/Bed: 4W09C/4W09C-01 Payor Info: Payor: MEDICARE / Plan: MEDICARE PART A AND B / Product Type: *No Product type* /     Admitting Diagnosis: iNTRACRANIAL HEMMORHAGE  Admit Date/Time:  07/19/2018  1:40 PM Admission Comments: No comment available    Primary Diagnosis:  <principal problem not specified> Principal Problem: <principal problem not specified>       Patient Active Problem List    Diagnosis Date Noted  . Current mild episode of major depressive disorder ([place])    . Intracerebral hemorrhage ([place]) 07/19/2018  . Acute CVA (cerebrovascular accident) ([place])    . Benign essential HTN    . Urinary retention    . Constipation    . ICH (intracerebral hemorrhage) ([place]) 07/13/2018  . Hypertension 07/06/2012  . Paroxysmal atrial fibrillation ([place]) 11/27/2011  . DIARRHEA, ACUTE 05/31/2009  . GERD 03/06/2009  . BACK PAIN 03/06/2009  . ANXIETY 06/12/2007  . DEPRESSION 06/12/2007  . HEADACHE 06/12/2007      Expected Discharge Date: Expected Discharge Date: 08/14/18   Team Members Present: Physician leading conference: Dr. Alysia Penna Social Worker Present: Ovidio Kin, LCSW Nurse Present: Benjie Karvonen, RN PT Present: Leavy Cella, PT OT Present: [name], OT SLP Present: Charolett Bumpers, SLP PPS Coordinator present : Ileana Ladd, PT       Current Status/Progress Goal Weekly Team Focus  Medical     heavy assist level, urinary retention   bladder emptying vs ICP teaching for caregiver  cont PT< OT, asess home envirnment barriers   Bowel/Bladder     Continent of bowel; I&O cath q4-6H; LBM 07/27/18  Remain continent of bowel, regain bladder function  Assess bowel and bladder needs q shift and PRN   Swallow/Nutrition/ Hydration               ADL's     +2 for ADLs, Lt homonymous hemianopsia, Lt lean in dynamic sitting, flaccid LUE.  Max assist LB dressing at bed level  min assist, downgraded transfers to mod assist  ADL retraining, dynamic sitting balance, transfers, sit > stand, LUE NMR   Mobility     +2 assist for transfers, max assist + w/c follow for safety for very short distance gait with rail, min<>mod assist w/c mobility, L inattention  supervision w/c mobility, min<>mod assist transfers, mod assist stair negotiation for home entry  midline orientation, sitting/standing balance, L NMR, transfers, L attention, gait, endurance, strengthening, w/c mobility   Communication               Safety/Cognition/ Behavioral Observations   Min- supervision A problem solving, Mod-min A scaning midline/left field  Mod I for complex  left inattention, complex problen solving and error awareness   Pain     C/o headache; took Tylenol twice during shift  pain </=3/10  assess pain q shift and PRN and medicate as  needed   Skin     bilat bruising to arms, abdomen, and coccyx; being treated with barrier cream  prevent infection and further breakdown  assess skin q shift and PRN     *See Care Plan and progress notes for long and short-term goals.      Barriers to Discharge   Current Status/Progress Possible Resolutions Date Resolved   Physician     Inaccessible home environment;Decreased caregiver support;Medical stability     slow progress with mobility, left neglect  Cont to focus on Left sided awareness      Nursing                 PT  Inaccessible home environment;Home environment access/layout;Lack of/limited family support  pt  has multiple steps to enter house & may need ramp, unsure if family can provide 24 hr supervision              OT                 SLP            SW              Discharge Planning/Teaching Needs:  Family here daily and very committed to pt and taking her home. Aware she will need 24 hr physical care.      Team Discussion:  Progressing slowly toward her goals. Nursing having to I & O caths not voiding. L-inattention and attention deficits hinder her. May need to downgrade goals to mod assist level due to slow progress. Continent of bowel. Still using stedy for transfers. Pt will be much care at discharge. Have family come in for observation in therapies.  Revisions to Treatment Plan:  Ques downgrade goals to mod assist level. DC 9/28    Continued Need for Acute Rehabilitation Level of Care: The patient requires daily medical management by a physician with specialized training in physical medicine and rehabilitation for the following conditions: Daily direction of a multidisciplinary physical rehabilitation program to ensure safe treatment while eliciting the highest outcome that is of practical value to the patient.: Yes Daily medical management of patient stability for increased activity during participation in an intensive rehabilitation regime.: Yes Daily analysis of laboratory values and/or radiology reports with any subsequent need for medication adjustment of medical intervention for : Neurological problems;Blood pressure problems     I attest that I was present, lead the team conference, and concur with the assessment and plan of the team.     Elease Hashimoto 07/28/2018, 1:50 PM                Finlee Milo, Gardiner Rhyme, LCSW  Social Worker  Physical Medicine and Rehabilitation  Patient Care Conference  Cosign Needed  Date of Service:  07/21/2018  1:15 PM          Cosign Needed        Show:Clear all [x] Manual[x] Template[] Copied  Added by: [x] Tattianna Schnarr, Gardiner Rhyme,  LCSW  [] Hover for details Inpatient RehabilitationTeam Conference and Plan of Care Update Date: 07/21/2018   Time: 10:30 AM      Patient Name: [name]: [id number]  Date of Birth: 1946/08/02 Sex: Female         Room/Bed: 4W09C/4W09C-01 Payor Info: Payor: MEDICARE / Plan: MEDICARE PART A AND B / Product Type: *No Product type* /     Admitting Diagnosis: iNTRACRANIAL HEMMORHAGE  Admit Date/Time:  07/19/2018  1:40  PM Admission Comments: No comment available    Primary Diagnosis:  <principal problem not specified> Principal Problem: <principal problem not specified>       Patient Active Problem List    Diagnosis Date Noted  . Intracerebral hemorrhage ([place]) 07/19/2018  . Acute CVA (cerebrovascular accident) ([place])    . Benign essential HTN    . Urinary retention    . Constipation    . ICH (intracerebral hemorrhage) ([place]) 07/13/2018  . Hypertension 07/06/2012  . Paroxysmal atrial fibrillation ([place]) 11/27/2011  . DIARRHEA, ACUTE 05/31/2009  . GERD 03/06/2009  . BACK PAIN 03/06/2009  . ANXIETY 06/12/2007  . DEPRESSION 06/12/2007  . HEADACHE 06/12/2007      Expected Discharge Date: Expected Discharge Date: 08/14/18   Team Members Present: Physician leading conference: Dr. Alysia Penna Social Worker Present: Ovidio Kin, LCSW Nurse Present: Dorien Chihuahua, RN PT Present: Lavone Nian, PT OT Present: [name], OT SLP Present: Stormy Fabian, SLP PPS Coordinator present : Daiva Nakayama, RN, CRRN       Current Status/Progress Goal Weekly Team Focus  Medical     Left neglect, Left hemiplegia  avoid falls, improve bladder emptying  Treat UTI   Bowel/Bladder     continenet of bowel, in/out cath Q4-6hrs, LBM 9/2  regain bladder function  continue in/out caths   Swallow/Nutrition/ Hydration               ADL's     +2 for all ADLs.  Lt homonymous hemianopsia, Lt lean in dynamic sitting, flaccid LUE  Min assist   ADL retraining, dynamic  sitting balance, transfers, sit > stand, LUE NMR   Mobility     +2 assist overall for transfers, single step negotiation, and ambulation x 15 ft, mod assist w/c mobility, L inattention  supervision w/c mobility, min<>mod assist transfers, mod assist stair negotiation for home entry  midline orientation, sitting/standing balance, L NMR, L attention, transfers, gait, endurance, strengthening, w/c mobility   Communication               Safety/Cognition/ Behavioral Observations             Pain     c/o constant headache, has tylenol q 4 prn, used twice last night  pain scale <3/10  continue to assess & treat as needed   Skin     bruising to abdomen & arms, fissure to coccyx area, using barrier cream  no new areas of skin breakdown or worsening of fissure  assess q shift     *See Care Plan and progress notes for long and short-term goals.      Barriers to Discharge   Current Status/Progress Possible Resolutions Date Resolved   Physician     Medical stability;Neurogenic Bowel & Bladder     progressing with therapy  cont rehab , I/O,       Nursing   Neurogenic Bowel & Bladder             PT  Inaccessible home environment;Home environment access/layout;Lack of/limited family support  pt has multiple steps to enter house & may need ramp, unsure if family can provide 24 hr supervision that is recommended              OT Home environment access/layout               SLP            SW  Discharge Planning/Teaching Needs:  Family plans to take pt home with 24 hr care, trying to arrange this between family members. Son lives behind pt       Team Discussion:  Goals min assist level , currently plus 2 with stedy for transfers. L-shoulder pain with movement MD addressing. Working on attention and awareness. L-neglect from CVA. Will need 24 hr physical care at discharge. MD addressing BP issues.Dense hemiparesis. Continent of bowel and had to I & O cath this am due to retention.  Neuro-psych to see for coping.  Revisions to Treatment Plan:  DC 9/28    Continued Need for Acute Rehabilitation Level of Care: The patient requires daily medical management by a physician with specialized training in physical medicine and rehabilitation for the following conditions: Daily direction of a multidisciplinary physical rehabilitation program to ensure safe treatment while eliciting the highest outcome that is of practical value to the patient.: Yes Daily medical management of patient stability for increased activity during participation in an intensive rehabilitation regime.: Yes Daily analysis of laboratory values and/or radiology reports with any subsequent need for medication adjustment of medical intervention for : Neurological problems;Blood pressure problems     I attest that I was present, lead the team conference, and concur with the assessment and plan of the team. Dr. Alysia Penna   Elease Hashimoto 07/21/2018, 1:15 PM               Patient ID: [name], female   DOB: 1946/02/14, 72 y.o.   MRN: [id number]

## 2018-07-28 NOTE — Progress Notes (Signed)
Subjective/Complaints:  No issues overnite, working with SLP on math problems  ROS:  Denies CP, SOB, N/V/D   Objective: Vital Signs: Blood pressure (!) 139/54, pulse 60, temperature 98.1 F (36.7 C), temperature source Oral, resp. rate 18, height '5\' 6"'  (1.676 m), weight 89 kg, SpO2 100 %. No results found. Results for orders placed or performed during the hospital encounter of 07/19/18 (from the past 72 hour(s))  Basic metabolic panel     Status: Abnormal   Collection Time: 07/26/18  5:30 AM  Result Value Ref Range   Sodium 136 135 - 145 mmol/L   Potassium 4.4 3.5 - 5.1 mmol/L   Chloride 102 98 - 111 mmol/L   CO2 25 22 - 32 mmol/L   Glucose, Bld 107 (H) 70 - 99 mg/dL   BUN 20 8 - 23 mg/dL   Creatinine, Ser 0.97 0.44 - 1.00 mg/dL   Calcium 10.1 8.9 - 10.3 mg/dL   GFR calc non Af Amer 58 (L) >60 mL/min   GFR calc Af Amer >60 >60 mL/min    Comment: (NOTE) The eGFR has been calculated using the CKD EPI equation. This calculation has not been validated in all clinical situations. eGFR's persistently <60 mL/min signify possible Chronic Kidney Disease.    Anion gap 9 5 - 15    Comment: Performed at [facility] [address] [place] [address]  CBC     Status: None   Collection Time: 07/26/18  5:30 AM  Result Value Ref Range   WBC 8.6 4.0 - 10.5 K/uL   RBC 5.06 3.87 - 5.11 MIL/uL   Hemoglobin 15.0 12.0 - 15.0 g/dL   HCT 45.2 36.0 - 46.0 %   MCV 89.3 78.0 - 100.0 fL   MCH 29.6 26.0 - 34.0 pg   MCHC 33.2 30.0 - 36.0 g/dL   RDW 12.5 11.5 - 15.5 %   Platelets 283 150 - 400 K/uL    Comment: Performed at [facility], [place] [address] [place], [place] [address]     HEENT: normal Cardio: RRR and no murmur Resp: CTA B/L and unlabored GI: BS positive and NT, ND Extremity:  Pulses positive and No Edema Skin:   Intact Neuro: Flat, Abnormal Sensory absent sensation Left hand intact LT sensaiton Left foot , Abnormal Motor 0/5 in LUE, trace Left hip add LLE,  normal strength on the R side, Normal FMC and Inattention, tone MAS 3 in Left hamstring, 1 beat clonus at left ankle Musc/Skel:  Other no pain with UE or LE ROM Gen NAD   Assessment/Plan: 1. 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 Physiatrist is providing close team supervision and 24 hour management of active medical problems listed below. Physiatrist and rehab team continue to assess barriers to discharge/monitor patient progress toward functional and medical goals. FIM: Function - Bathing Position: Bed Body parts bathed by patient: Left arm, Chest, Abdomen, Right upper leg, Left upper leg, Right lower leg, Left lower leg Body parts bathed by helper: Right arm, Back Bathing not applicable: Front perineal area, Buttocks Assist Level: (Mod assist)  Function- Upper Body Dressing/Undressing What is the patient wearing?: Pull over shirt/dress, Bra Bra - Perfomed by patient: Thread/unthread right bra strap Bra - Perfomed by helper: Thread/unthread left bra strap, Hook/unhook bra (pull down sports bra) Pull over shirt/dress - Perfomed by patient: Thread/unthread right sleeve,  Put head through opening Pull over shirt/dress - Perfomed by helper: Thread/unthread left sleeve, Pull shirt over trunk Assist Level: (Max assist) Function - Lower Body Dressing/Undressing What is the patient wearing?: Pants, Non-skid slipper socks, Ted Hose Position: Bed Pants- Performed by patient: Thread/unthread right pants leg, Thread/unthread left pants leg Pants- Performed by helper: Pull pants up/down Non-skid slipper socks- Performed by helper: Don/doff right sock, Don/doff left sock Assist for footwear: Maximal assist Assist for lower body dressing: (Max assist)  Function - Toileting Toileting steps completed by helper: Adjust clothing prior to toileting,  Performs perineal hygiene, Adjust clothing after toileting Assist level: Two helpers  Function - Air cabin crew transfer activity did not occur: Safety/medical concerns Toilet transfer assistive device: Bedside commode, Mechanical lift Mechanical lift: Stedy Assist level to bedside commode (at bedside): 2 helpers Assist level from bedside commode (at bedside): 2 helpers  Function - Chair/bed transfer Chair/bed transfer method: Squat pivot Chair/bed transfer assist level: 2 helpers Chair/bed transfer assistive device: Armrests Mechanical lift: Stedy Chair/bed transfer details: Manual facilitation for placement, Manual facilitation for weight shifting, Verbal cues for technique, Verbal cues for sequencing, Tactile cues for sequencing, Tactile cues for weight shifting, Tactile cues for posture, Tactile cues for placement  Function - Locomotion: Wheelchair Will patient use wheelchair at discharge?: Yes Type: Manual Max wheelchair distance: 65 ft  Assist Level: Supervision or verbal cues Assist Level: Supervision or verbal cues Wheel 150 feet activity did not occur: Safety/medical concerns Assist Level: Moderate assistance (Pt 50 - 74%) Turns around,maneuvers to table,bed, and toilet,negotiates 3% grade,maneuvers on rugs and over doorsills: No Function - Locomotion: Ambulation Assistive device: Rail in hallway Max distance: 7 ft Assist level: 2 helpers(max assist + w/c follow) Assist level: 2 helpers Walk 50 feet with 2 turns activity did not occur: Safety/medical concerns Walk 150 feet activity did not occur: Safety/medical concerns Walk 10 feet on uneven surfaces activity did not occur: Safety/medical concerns  Function - Comprehension Comprehension: Auditory Comprehension assist level: Follows basic conversation/direction with extra time/assistive device  Function - Expression Expression: Verbal Expression assist level: Expresses basic needs/ideas: With no  assist  Function - Social Interaction Social Interaction assist level: Interacts appropriately with others with medication or extra time (anti-anxiety, antidepressant).  Function - Problem Solving Problem solving assist level: Solves basic 75 - 89% of the time/requires cueing 10 - 24% of the time  Function - Memory Memory assist level: Complete Independence: No helper Patient normally able to recall (first 3 days only): Current season, Staff names and faces, That he or she is in a hospital, Location of own room  Medical Problem List and Plan: 1. Decline in functional status due to resultant left-sided weakness with sensory deficits and left homonymous  hemianopsia secondary to large Right occipitoparietal ICH CIR PT, OT, SLP-Team conference today please see physician documentation under team conference tab, met with team face-to-face to discuss problems,progress, and goals. Formulized individual treatment plan based on medical history, underlying problem and comorbidities..2.  DVT Prophylaxis/Anticoagulation: Mechanical: Sequential compression devices, below knee Bilateral lower extremities 3. Pain Management: Continue Tylenol as needed for headaches 4. Mood: LCSW to follow for evaluation and support, neuropsych to follow , on Zoloft 71m Slept well 5. Neuropsych: This patient is capable of making decisions on her own behalf. 6. Skin/Wound Care: Routine pressure relief measures 7. Fluids/Electrolytes/Nutrition: Monitor I's and O's.  Lytes and CBC nl 9/9, ask dietary to see for dietary, needs set up int sup for meals choices, 8. HTN: Monitor  blood pressure twice daily Vitals:   07/27/18 2027 07/28/18 0452  BP: (!) 141/73 (!) 139/54  Pulse: 66 60  Resp: 18 18  Temp: 98.1 F (36.7 C) 98.1 F (36.7 C)  SpO2: 96% 100%  9.  PAF: Monitor heart rate twice daily-- appears in NSR on Tambocor and metoprolol twice daily.Bradycardia improved metoprolol 12.59m BID and monitor, 10. Constipation:  MiraLAX today.  May need to increase senna to twice daily 11.  Urinary retention: Remove Foley today and start voiding trial.  Monitor voiding with PVR checks. E coli >100K on cath urine  Macrodantin Sensitive cont x 1 more days    LOS (Days) 9 A FACE TO FACE EVALUATION WAS PERFORMED  ACharlett Blake9/09/2018, 8:05 AM

## 2018-07-29 ENCOUNTER — Inpatient Hospital Stay (HOSPITAL_COMMUNITY): Payer: Medicare Other

## 2018-07-29 ENCOUNTER — Ambulatory Visit (HOSPITAL_COMMUNITY): Payer: Medicare Other | Admitting: Physical Therapy

## 2018-07-29 ENCOUNTER — Inpatient Hospital Stay (HOSPITAL_COMMUNITY): Payer: Medicare Other | Admitting: Occupational Therapy

## 2018-07-29 ENCOUNTER — Inpatient Hospital Stay (HOSPITAL_COMMUNITY): Payer: Medicare Other | Admitting: Speech Pathology

## 2018-07-29 NOTE — Progress Notes (Signed)
Occupational Therapy Session Note  Patient Details  Name: [name] MRN: [id number] Date of Birth: 08-Oct-1946  Today's Date: 07/29/2018 OT Individual Time: 5974-1638 OT Individual Time Calculation (min): 57 min    Short Term Goals: Week 2:  OT Short Term Goal 1 (Week 2): Pt will complete toilet transfers with max assist of 1 caregiver OT Short Term Goal 2 (Week 2): Pt will don pants with max assist of 1 caregiver OT Short Term Goal 3 (Week 2): Pt will complete bathing with min assist OT Short Term Goal 4 (Week 2): Pt will scan to Lt to obtain items during self-care tasks with min cues  Skilled Therapeutic Interventions/Progress Updates:    Treatment session with focus on LB dressing, Lt attention, and sit > stand.  Pt received supine in bed reporting fatigue but willing to engage in therapy session.  Pt donned hospital socks at bed level, requiring assist to cross LLE over Rt knee and utilizing backward chaining when donning Lt sock.  Completed bed mobility with mod assist with improved sequencing and initiation of movement.  Sit > stand bed > Stedy with max assist and then min assist from [place].  Engaged in visual scanning task at table with focus on locating items in Lt visual field.  Completed 2 peg patterns in sitting with mod cues for awareness of errors and then problem solving to correct them.  Sit > stand with 3 musketeers to facilitate anterior weight shift and then engaged in reaching task at midline with manual facilitation at Lt knee and under Lt shoulder to promote upright standing balance while 2nd person provided intermittent tactile cues for upright posture.  Pt returned to room and required +2 sit > stand in [place] from w/c due to fatigue.  Returned to bed and left semi-reclined with all needs in reach.  Therapy Documentation Precautions:  Precautions Precautions: Fall Precaution Comments: L hemi, L inattention/field cut Restrictions Weight Bearing Restrictions:  No General:   Vital Signs: Therapy Vitals Pulse Rate: 60 Resp: 19 BP: (!) 132/51 Patient Position (if appropriate): Lying Oxygen Therapy SpO2: 98 % O2 Device: Room Air Pain:  Pt with no c/o pain  See Function Navigator for Current Functional Status.   Therapy/Group: Individual Therapy  [name] 07/29/2018, 4:05 PM

## 2018-07-29 NOTE — Progress Notes (Signed)
Occupational Therapy Session Note  Patient Details  Name: [name] MRN: [id number] Date of Birth: October 01, 1946  Today's Date: 07/29/2018 OT Individual Time: 0915-1000 OT Individual Time Calculation (min): 45 min    Short Term Goals: Week 1:  OT Short Term Goal 1 (Week 1): Pt will complete LB bathing with max assist OT Short Term Goal 1 - Progress (Week 1): Met OT Short Term Goal 2 (Week 1): Pt will complete toilet transfers with max assist of 1 caregiver OT Short Term Goal 2 - Progress (Week 1): Progressing toward goal OT Short Term Goal 3 (Week 1): Pt will don pants with max assist of 1 caregiver OT Short Term Goal 3 - Progress (Week 1): Progressing toward goal  Skilled Therapeutic Interventions/Progress Updates:    1:1. Pt with c/o HA but does not rate. RN aware. Pt willing to complete tx to tolerance and rest PRN. OT dons teds and non skid socks and pt supine>sitting EOB with MOD A and VC for sequencing. Pt grooms at sink with VC for LUE incorporation at gross assist level. OT provides HOH A to apply doedorant and comb hair with LUE. Pt practices UE dressing doffing and donned shirt with supervision and MAX step by step multimodal cueing. Exited session with pt seated in w/c, belt alarm on and call light in reach  Therapy Documentation Precautions:  Precautions Precautions: Fall Precaution Comments: L hemi, L inattention/field cut Restrictions Weight Bearing Restrictions: No General:    See Function Navigator for Current Functional Status.   Therapy/Group: Individual Therapy  Tonny Branch 07/29/2018, 9:31 AM

## 2018-07-29 NOTE — Procedures (Signed)
Botox Injection for spasticity using needle EMG guidance Lot #[id number] EXP 07/2019 Dilution: 50 Units/ml Indication: Severe spasticity which interferes with ADL,mobility and/or  hygiene and is unresponsive to medication management and other conservative care Informed consent was obtained after describing risks and benefits of the procedure with the patient. This includes bleeding, bruising, infection, excessive weakness, or medication side effects.  NO EGG ALLERGY REVIEWED MED LIST Needle: 25g needle 1.5in Number of units per muscle  Hamstrings200 All injections were done after obtaining appropriate EMG activity and after negative drawback for blood. The patient tolerated the procedure well. Post procedure instructions were given. A followup appointment was made.

## 2018-07-29 NOTE — Progress Notes (Signed)
Speech Language Pathology Daily Session Note  Patient Details  Name: ALIZON SCHMELING MRN: [id number] Date of Birth: [birth year]  Today's Date: 07/29/2018 SLP Individual Time: 0815-0900 SLP Individual Time Calculation (min): 45 min  Short Term Goals: Week 2: SLP Short Term Goal 1 (Week 2): Patient will demonstrate functional problem solving for mildly complex tasks with supervision verbal cues. SLP Short Term Goal 2 (Week 2): Patient will attend to/locate items in left field of enviornment during functional tasks with Min A verbal cues.  SLP Short Term Goal 3 (Week 2): Patient will self-monitor and correct errors during functional tasks with supervision verbal cues.   Skilled Therapeutic Interventions: Skilled treatment session focused on cognition goals. SLP facilitated session by providing Min A faded to supervision cues to locate items to left of midline on pt's breakfast tray. Pt with questions regarding team conference and recommendation for SNF placement at discharge from [facility]. Pt's questions indicated that she doesn't have anticipatory awareness of how physical deficits (and left inattention etc) make her a high fall risk. With multiple hypothetical situations and question cues, pt was able to voice that she would not be able to ambulate with rolling walker to bathroom and would fall if she tried at home. Education also given on importance of medication management, impact of left inattention on problem solving within daily living.  Pt mildly emotional and support given. Pt left upright in bed, bed alarm on and all needs within reach. Continue per current plan of care.      Function:    Cognition Comprehension Comprehension assist level: Follows basic conversation/direction with extra time/assistive device  Expression   Expression assist level: Expresses basic needs/ideas: With no assist  Social Interaction Social Interaction assist level: Interacts appropriately with others with  medication or extra time (anti-anxiety, antidepressant).  Problem Solving Problem solving assist level: Solves basic problems with no assist;Solves complex 90% of the time/cues < 10% of the time  Memory Memory assist level: More than reasonable amount of time    Pain Pain Assessment Pain Scale: 0-10 Pain Score: 0-No pain  Therapy/Group: Individual Therapy  Magnus Crescenzo 07/29/2018, 9:19 AM

## 2018-07-29 NOTE — Progress Notes (Signed)
Subjective/Complaints:  No issues overnite, discussed SNF as next step  ROS:  Denies CP, SOB, N/V/D   Objective: Vital Signs: Blood pressure 118/61, pulse 62, temperature 98 F (36.7 C), resp. rate 18, height 5\' 6"  (1.676 m), weight 89 kg, SpO2 95 %. No results found. No results found for this or any previous visit (from the past 72 hour(s)).   HEENT: normal Cardio: RRR and no murmur Resp: CTA B/L and unlabored GI: BS positive and NT, ND Extremity:  Pulses positive and No Edema Skin:   Intact Neuro: Flat, Abnormal Sensory absent sensation Left hand intact LT sensaiton Left foot , Abnormal Motor 0/5 in LUE, trace Left hip add LLE, normal strength on the R side, Normal FMC and Inattention, tone MAS 3 in Left hamstring, 1 beat clonus at left ankle Musc/Skel:  Other no pain with UE or LE ROM Gen NAD   Assessment/Plan: 1. 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 Physiatrist is providing close team supervision and 24 hour management of active medical problems listed below. Physiatrist and rehab team continue to assess barriers to discharge/monitor patient progress toward functional and medical goals. FIM: Function - Bathing Position: Shower Body parts bathed by patient: Left arm, Chest, Abdomen, Front perineal area, Buttocks, Right upper leg, Left upper leg Body parts bathed by helper: Right arm, Right lower leg, Left lower leg, Back Bathing not applicable: Front perineal area, Buttocks Assist Level: Touching or steadying assistance(Pt > 75%)(Mod assist)  Function- Upper Body Dressing/Undressing What is the patient wearing?: Pull over shirt/dress, Bra Bra - Perfomed by patient: Thread/unthread right bra strap Bra - Perfomed by helper: Thread/unthread left bra strap, Hook/unhook bra (pull down sports bra) Pull over shirt/dress - Perfomed  by patient: Thread/unthread right sleeve, Put head through opening Pull over shirt/dress - Perfomed by helper: Thread/unthread left sleeve, Pull shirt over trunk Assist Level: (Max assist) Function - Lower Body Dressing/Undressing What is the patient wearing?: Pants, Non-skid slipper socks, Ted Hose Position: (recliner) Pants- Performed by patient: Thread/unthread right pants leg, Thread/unthread left pants leg Pants- Performed by helper: Thread/unthread right pants leg, Thread/unthread left pants leg, Pull pants up/down Non-skid slipper socks- Performed by helper: Don/doff right sock, Don/doff left sock TED Hose - Performed by helper: Don/doff right TED hose, Don/doff left TED hose Assist for footwear: Maximal assist Assist for lower body dressing: 2 Helpers(Stedy)  Function - Toileting Toileting steps completed by patient: Performs perineal hygiene Toileting steps completed by helper: Adjust clothing prior to toileting, Performs perineal hygiene, Adjust clothing after toileting Assist level: Two helpers  Function - Air cabin crew transfer activity did not occur: Safety/medical concerns Toilet transfer assistive device: Bedside commode, Mechanical lift Mechanical lift: Stedy Assist level to toilet: 2 helpers Assist level from toilet: 2 helpers Assist level to bedside commode (at bedside): 2 helpers Assist level from bedside commode (at bedside): 2 helpers  Function - Chair/bed transfer Chair/bed transfer method: Squat pivot Chair/bed transfer assist level: 2 helpers Chair/bed transfer assistive device: Armrests Mechanical lift: Stedy Chair/bed transfer details: Manual facilitation for placement, Manual facilitation for weight shifting, Verbal cues for technique, Verbal cues for sequencing, Tactile cues for sequencing, Tactile cues for weight shifting, Tactile cues for posture, Tactile cues for placement  Function - Locomotion: Wheelchair Will patient use wheelchair at  discharge?: Yes Type: Manual Max wheelchair distance: 100 ft Assist Level: Touching or steadying assistance (Pt >  75%) Assist Level: Touching or steadying assistance (Pt > 75%) Wheel 150 feet activity did not occur: Safety/medical concerns Assist Level: Moderate assistance (Pt 50 - 74%) Turns around,maneuvers to table,bed, and toilet,negotiates 3% grade,maneuvers on rugs and over doorsills: No Function - Locomotion: Ambulation Assistive device: Rail in hallway Max distance: 7 ft Assist level: 2 helpers(max assist + w/c follow) Assist level: 2 helpers Walk 50 feet with 2 turns activity did not occur: Safety/medical concerns Walk 150 feet activity did not occur: Safety/medical concerns Walk 10 feet on uneven surfaces activity did not occur: Safety/medical concerns  Function - Comprehension Comprehension: Auditory Comprehension assist level: Follows basic conversation/direction with extra time/assistive device  Function - Expression Expression: Verbal Expression assist level: Expresses basic needs/ideas: With no assist  Function - Social Interaction Social Interaction assist level: Interacts appropriately with others with medication or extra time (anti-anxiety, antidepressant).  Function - Problem Solving Problem solving assist level: Solves basic problems with no assist  Function - Memory Memory assist level: Complete Independence: No helper Patient normally able to recall (first 3 days only): Current season, Staff names and faces, That he or she is in a hospital, Location of own room  Medical Problem List and Plan: 1. Decline in functional status due to resultant left-sided weakness with sensory deficits and left homonymous  hemianopsia secondary to large Right occipitoparietal ICH CIR PT, OT, SLP-..2.  DVT Prophylaxis/Anticoagulation: Mechanical: Sequential compression devices, below knee Bilateral lower extremities 3. Pain Management: Continue Tylenol as needed for  headaches 4. Mood: LCSW to follow for evaluation and support, neuropsych to follow , on Zoloft 50mg  Slept well 5. Neuropsych: This patient is capable of making decisions on her own behalf. 6. Skin/Wound Care: Routine pressure relief measures 7. Fluids/Electrolytes/Nutrition: Monitor I's and O's.  Lytes and CBC nl 9/9, ask dietary to see for dietary, needs set up int sup for meals choices, 8. HTN: Monitor blood pressure twice daily controlled 9/12 Vitals:   07/28/18 1940 07/29/18 0530  BP: 121/61 118/61  Pulse: 73 62  Resp: 18 18  Temp: 98 F (36.7 C)   SpO2: 97% 95%  9.  PAF: Monitor heart rate twice daily-- appears in NSR on Tambocor and metoprolol twice daily.Bradycardia improved metoprolol 12.5mg  BID and monitor, 10. Constipation: MiraLAX today.  May need to increase senna to twice daily 11.  Urinary retention: Remove Foley today and start voiding trial.  Monitor voiding with PVR checks. E coli >100K on cath urine  Macrodantin finished today     LOS (Days) 10 A FACE TO FACE EVALUATION WAS PERFORMED  Charlett Blake 07/29/2018, 7:52 AM

## 2018-07-29 NOTE — Progress Notes (Signed)
Physical Therapy Session Note  Patient Details  Name: [name] MRN: [id number] Date of Birth: 05-24-1946  Today's Date: 07/29/2018 PT Individual Time: 1005-1047 PT Individual Time Calculation (min): 42 min   Short Term Goals:  Week 2:  PT Short Term Goal 1 (Week 2): Pt will consistently complete bed<>w/c transfers with max assist +1. PT Short Term Goal 2 (Week 2): Pt will propel w/c 75 ft with supervision. PT Short Term Goal 3 (Week 2): Pt will complete bed mobility with mod assist.  Skilled Therapeutic Interventions/Progress Updates:   Seated in w/c, neuromuscular re-education via multimodal cues for trunk flexion/extension x 10 without use of UEs, to activate core muscles, with good results.  W/c> standing in Stedy +2.  From high seat of Stedy, sit>< stand x 9 to retrieve playing cards on her R and match to board in front and slightly to L of pt.  RUE use on bar of Stedy, fading to bil UEs across chest for standing up from high seat.  Min cues for L visual attention, mod assist for = wt bearing bil LEs in standing. Pt stated that when she did not pull up using her r hand, she felt her L LE pushing up to stand.  In sitting on high seat of Stedy, pt leaned L >< midline x 10, focusing on finding midline and not moving past it to R, with good result.  Pt left resting in w/c with seat belt alarm applied and all needs within reach; w/c positioned in room so that pt must visually attend to L to see TV.Marland Kitchen     Therapy Documentation Precautions:  Precautions Precautions: Fall Precaution Comments: L hemi, L inattention/field cut Restrictions Weight Bearing Restrictions: No   Pain: Pain Assessment Pain Scale: 0-10 Pain Score: 6  Pain Location: Head Pain Descriptors / Indicators: Headache Patients Stated Pain Goal: 3 Pain Intervention(s): Medication (See eMAR)    See Function Navigator for Current Functional Status.   Therapy/Group: Individual  Therapy  Jourdan Maldonado 07/29/2018, 10:55 AM

## 2018-07-30 ENCOUNTER — Inpatient Hospital Stay (HOSPITAL_COMMUNITY): Payer: Medicare Other | Admitting: Speech Pathology

## 2018-07-30 ENCOUNTER — Inpatient Hospital Stay (HOSPITAL_COMMUNITY): Payer: Medicare Other | Admitting: Occupational Therapy

## 2018-07-30 ENCOUNTER — Inpatient Hospital Stay (HOSPITAL_COMMUNITY): Payer: Medicare Other | Admitting: Physical Therapy

## 2018-07-30 NOTE — Progress Notes (Signed)
Social Work Patient ID: [name], female   DOB: [birth year], 72 y.o.   MRN: [id number]  Met with pt and spoke with daughter-Shelly via telephone to discuss team conference goals min-mod level of assist and if family can provide the care pt will need at discharge. All plan to discuss this weekend and get back with worker. Looks like pt may need to go to a NH until she is not as much physical care. Will work safe discharge plan. Pt understands and voiced they both work I don't think they are thinking about 24 hr care.

## 2018-07-30 NOTE — Progress Notes (Signed)
Physical Therapy Session Note  Patient Details  Name: [name] MRN: [id number] Date of Birth: 1946-10-06  Today's Date: 07/30/2018 PT Individual Time: 1594-5859 PT Individual Time Calculation (min): 53 min   Short Term Goals: Week 2:  PT Short Term Goal 1 (Week 2): Pt will consistently complete bed<>w/c transfers with max assist +1. PT Short Term Goal 2 (Week 2): Pt will propel w/c 75 ft with supervision. PT Short Term Goal 3 (Week 2): Pt will complete bed mobility with mod assist.  Skilled Therapeutic Interventions/Progress Updates:  Pt received in bed reporting being premedicated. No current c/o pain. Therapist assists with donning thigh high ted hose, bra, pants & shoes total assist for time management. Pt is able to roll L with steady assist but requires max assist to roll R and has difficulty demonstrating compensatory technique 2/2 LLE tone. Pt transfers L sidelying>sitting with light max assist with improved ability to push trunk upright with RUE. Pt completes squat pivot bed>w/c with max assist +1 but 2nd person present for safety with ongoing cuing for overall technique. Pt propels w/c room>gym with supervision and heavy cuing to turn head/eyes to attend to L and for obstacle avoidance. Introduced Warehouse manager & educated pt on use of AD for increased ease with transfers. Pt completes slide board transfer w/c<>mat table with max assist + 2nd person to stabilize board with cuing for head/hips relationship & anterior weight shifting. Pt requires ongoing education for controled leans vs flopping over. While sitting EOM pt engaged in lateral scooting along edge of mat table with no ability to activate LLE to push and requires max assist to clear buttocks and weight shift L<>R. Pt engaged in dynamic sitting balance activity requiring her to reach outside of BOS in all directions focusing on controlled leaning, core strengthening & returning to midline. Pt requires ongoing cuing to hold  head up instead of looking down. Pt does report SOB & fatigue during session & rest breaks taken PRN. At end of session pt left in w/c in handoff to SLP.  Pt continues to demonstrate significant L flexor tone in sitting, requiring manual facilitation for LLE placement and positioning.   Therapy Documentation Precautions:  Precautions Precautions: Fall Precaution Comments: L hemi, L inattention/field cut Restrictions Weight Bearing Restrictions: No   See Function Navigator for Current Functional Status.   Therapy/Group: Individual Therapy  [name] 07/30/2018, 9:10 AM

## 2018-07-30 NOTE — Progress Notes (Signed)
Occupational Therapy Session Note  Patient Details  Name: TAELYNN MCELHANNON MRN: [id number] Date of Birth: 08-10-1946  Today's Date: 07/30/2018 OT Individual Time: 9977-4142 OT Individual Time Calculation (min): 69 min    Short Term Goals: Week 2:  OT Short Term Goal 1 (Week 2): Pt will complete toilet transfers with max assist of 1 caregiver OT Short Term Goal 2 (Week 2): Pt will don pants with max assist of 1 caregiver OT Short Term Goal 3 (Week 2): Pt will complete bathing with min assist OT Short Term Goal 4 (Week 2): Pt will scan to Lt to obtain items during self-care tasks with min cues  Skilled Therapeutic Interventions/Progress Updates:    Treatment session with focus on LUE NMR and reduction of Lt shoulder subluxation/pain.  Pt received upright in w/c reporting fatigue but willing to engage in therapy session.  Pt requesting to go outside during therapy session.  Therapist pushed w/c from time management stand point.  Engaged in [place] with focus on PROM of shoulder, elbow, and wrist.  Pt demonstrated slight finger movement in first two digits.  Pt continues to report pain in Rt shoulder with flexion > 90*.  Therapist applied kinesiotape to Rt shoulder for approximation of shoulder joint and to decrease subluxation.  Completed sit > stand in to [place] with max assist and sit > stand x5 from [place] seat with min guard.  Pt returned to bed at end of session via Stedy and left semi-reclined with all needs in reach.    Therapy Documentation Precautions:  Precautions Precautions: Fall Precaution Comments: L hemi, L inattention/field cut Restrictions Weight Bearing Restrictions: No General:   Vital Signs: Therapy Vitals Temp: 97.8 F (36.6 C) Temp Source: Oral Pulse Rate: (!) 52 Resp: 18 BP: 125/64 Patient Position (if appropriate): Lying Oxygen Therapy SpO2: 99 % O2 Device: Room Air Pain: Pain Assessment Pain Scale: 0-10 Pain Score: 5  Pain Type: Acute pain Pain  Location: Leg Pain Orientation: Left Pain Descriptors / Indicators: Aching Pain Frequency: Intermittent Pain Onset: With Activity Pain Intervention(s): Medication (See eMAR)  See Function Navigator for Current Functional Status.   Therapy/Group: Individual Therapy  [name] 07/30/2018, 3:52 PM

## 2018-07-30 NOTE — Progress Notes (Signed)
Speech Language Pathology Daily Session Note  Patient Details  Name: [name] MRN: [id number] Date of Birth: 1946-01-26  Today's Date: 07/30/2018 SLP Individual Time: 0900-1000 SLP Individual Time Calculation (min): 60 min  Short Term Goals: Week 2: SLP Short Term Goal 1 (Week 2): Patient will demonstrate functional problem solving for mildly complex tasks with supervision verbal cues. SLP Short Term Goal 2 (Week 2): Patient will attend to/locate items in left field of enviornment during functional tasks with Min A verbal cues.  SLP Short Term Goal 3 (Week 2): Patient will self-monitor and correct errors during functional tasks with supervision verbal cues.   Skilled Therapeutic Interventions: Skilled treatment session focused cognition goals. SLP facilitated session by providing opportunity for pt to go to [facility], place order for breakfast and recall therapists' order for coffee. Pt able to recall therapists' order for coffee with Mod I. Pt required Min A anticipatory cues for left attention while navigating tables and hallway to find [place]. Pt with good progress towards goals. SLP returned pt to room, left upright in wheelchair with all needs within reach. Continue per plan of care.      Function:  Eating Eating   Modified Consistency Diet: No Eating Assist Level: Set up assist for   Eating Set Up Assist For: Opening containers;Cutting food       Cognition Comprehension Comprehension assist level: Follows basic conversation/direction with no assist  Expression   Expression assist level: Expresses basic needs/ideas: With no assist  Social Interaction Social Interaction assist level: Interacts appropriately with others with medication or extra time (anti-anxiety, antidepressant).  Problem Solving Problem solving assist level: Solves complex 90% of the time/cues < 10% of the time  Memory Memory assist level: More than reasonable amount of time    Pain Pain  Assessment Pain Scale: 0-10 Pain Score: 4  Pain Type: Acute pain Pain Location: Head Pain Descriptors / Indicators: Aching;Headache Pain Frequency: Occasional Pain Onset: Gradual Pain Intervention(s): Medication (See eMAR)  Therapy/Group: Individual Therapy  Curt Oatis 07/30/2018, 10:30 AM

## 2018-07-30 NOTE — Progress Notes (Signed)
Subjective/Complaints:  No soreness from botox, slept ok, moving Left fingers better  ROS:  Denies CP, SOB, N/V/D   Objective: Vital Signs: Blood pressure (!) 114/54, pulse (!) 56, temperature 97.9 F (36.6 C), resp. rate 18, height 5\' 6"  (1.676 m), weight 89 kg, SpO2 98 %. No results found. No results found for this or any previous visit (from the past 72 hour(s)).   HEENT: normal Cardio: RRR and no murmur Resp: CTA B/L and unlabored GI: BS positive and NT, ND Extremity:  Pulses positive and No Edema Skin:   Intact Neuro: Flat, Abnormal Sensory absent sensation Left hand intact LT sensaiton Left foot , Abnormal Motor 0/5 in LUE, 2- finger flexors tone enhanced , trace Left hip add LLE, normal strength on the R side, Normal FMC and Inattention, tone MAS 3 in Left hamstring, 1 beat clonus at left ankle Musc/Skel:  Other no pain with UE or LE ROM Gen NAD   Assessment/Plan: 1. 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 Physiatrist is providing close team supervision and 24 hour management of active medical problems listed below. Physiatrist and rehab team continue to assess barriers to discharge/monitor patient progress toward functional and medical goals. FIM: Function - Bathing Position: Shower Body parts bathed by patient: Left arm, Chest, Abdomen, Front perineal area, Buttocks, Right upper leg, Left upper leg Body parts bathed by helper: Right arm, Right lower leg, Left lower leg, Back Bathing not applicable: Front perineal area, Buttocks Assist Level: Touching or steadying assistance(Pt > 75%)(Mod assist)  Function- Upper Body Dressing/Undressing What is the patient wearing?: Pull over shirt/dress, Bra Bra - Perfomed by patient: Thread/unthread right bra strap Bra - Perfomed by helper: Thread/unthread left bra strap, Hook/unhook  bra (pull down sports bra) Pull over shirt/dress - Perfomed by patient: Thread/unthread right sleeve, Put head through opening Pull over shirt/dress - Perfomed by helper: Thread/unthread left sleeve, Pull shirt over trunk Assist Level: (Max assist) Function - Lower Body Dressing/Undressing What is the patient wearing?: Pants, Non-skid slipper socks, Ted Hose Position: (recliner) Pants- Performed by patient: Thread/unthread right pants leg, Thread/unthread left pants leg Pants- Performed by helper: Thread/unthread right pants leg, Thread/unthread left pants leg, Pull pants up/down Non-skid slipper socks- Performed by helper: Don/doff right sock, Don/doff left sock TED Hose - Performed by helper: Don/doff right TED hose, Don/doff left TED hose Assist for footwear: Maximal assist Assist for lower body dressing: 2 Helpers(Stedy)  Function - Toileting Toileting steps completed by patient: Performs perineal hygiene Toileting steps completed by helper: Adjust clothing prior to toileting, Performs perineal hygiene, Adjust clothing after toileting Assist level: Two helpers  Function - Air cabin crew transfer activity did not occur: Safety/medical concerns Toilet transfer assistive device: Bedside commode, Mechanical lift Mechanical lift: Stedy Assist level to toilet: 2 helpers Assist level from toilet: 2 helpers Assist level to bedside commode (at bedside): 2 helpers Assist level from bedside commode (at bedside): 2 helpers  Function - Chair/bed transfer Chair/bed transfer method: Squat pivot Chair/bed transfer assist level: 2 helpers Chair/bed transfer assistive device: Armrests Mechanical lift: Stedy Chair/bed transfer details: Manual facilitation for placement, Manual facilitation for weight shifting, Verbal cues for technique, Verbal cues for sequencing, Tactile cues for sequencing, Tactile cues for weight shifting, Tactile cues for posture, Tactile cues for placement  Function  - Locomotion: Wheelchair Will patient use wheelchair at discharge?: Yes Type: Manual Max wheelchair distance:  100 ft Assist Level: Touching or steadying assistance (Pt > 75%) Assist Level: Touching or steadying assistance (Pt > 75%) Wheel 150 feet activity did not occur: Safety/medical concerns Assist Level: Moderate assistance (Pt 50 - 74%) Turns around,maneuvers to table,bed, and toilet,negotiates 3% grade,maneuvers on rugs and over doorsills: No Function - Locomotion: Ambulation Assistive device: Rail in hallway Max distance: 7 ft Assist level: 2 helpers(max assist + w/c follow) Assist level: 2 helpers Walk 50 feet with 2 turns activity did not occur: Safety/medical concerns Walk 150 feet activity did not occur: Safety/medical concerns Walk 10 feet on uneven surfaces activity did not occur: Safety/medical concerns  Function - Comprehension Comprehension: Auditory Comprehension assist level: Follows basic conversation/direction with extra time/assistive device  Function - Expression Expression: Verbal Expression assist level: Expresses basic needs/ideas: With no assist  Function - Social Interaction Social Interaction assist level: Interacts appropriately with others with medication or extra time (anti-anxiety, antidepressant).  Function - Problem Solving Problem solving assist level: Solves basic problems with no assist  Function - Memory Memory assist level: More than reasonable amount of time Patient normally able to recall (first 3 days only): Current season, That he or she is in a hospital  Medical Problem List and Plan: 1. Decline in functional status due to resultant left-sided weakness with sensory deficits and left homonymous  hemianopsia secondary to large Right occipitoparietal ICH CIR PT, OT, SLP-discussed seizure risk, overall ~10% pt aware of symptoms .Marland Kitchen2.  DVT Prophylaxis/Anticoagulation: Mechanical: Sequential compression devices, below knee Bilateral lower  extremities 3. Pain Management: Continue Tylenol as needed for headaches 4. Mood: LCSW to follow for evaluation and support, neuropsych to follow , on Zoloft 50mg  Slept well 5. Neuropsych: This patient is capable of making decisions on her own behalf. 6. Skin/Wound Care: Routine pressure relief measures 7. Fluids/Electrolytes/Nutrition: Monitor I's and O's.  Lytes and CBC nl 9/9, ask dietary to see for dietary, needs set up int sup for meals choices, 8. HTN: Monitor blood pressure twice daily controlled 9/12 Vitals:   07/29/18 1944 07/30/18 0512  BP: (!) 106/55 (!) 114/54  Pulse: 67 (!) 56  Resp: 16 18  Temp: 98.2 F (36.8 C) 97.9 F (36.6 C)  SpO2: 96% 98%  9.  PAF: Monitor heart rate twice daily-- appears in NSR on Tambocor and metoprolol twice daily.Bradycardia improved metoprolol 12.5mg  BID and monitor, 10. Constipation: MiraLAX today.  May need to increase senna to twice daily 11.  Urinary retention: Remove Foley today and start voiding trial.  Monitor voiding with PVR checks. E coli >100K on cath urine  Macrodantin finished today     LOS (Days) 11 A FACE TO FACE EVALUATION WAS PERFORMED  Charlett Blake 07/30/2018, 7:49 AM

## 2018-07-30 NOTE — Progress Notes (Signed)
Occupational Therapy Session Note  Patient Details  Name: [name] MRN: [id number] Date of Birth: August 30, 1946  Today's Date: 07/30/2018 OT Individual Time: 1130-1200 OT Individual Time Calculation (min): 30 min    Short Term Goals: Week 2:  OT Short Term Goal 1 (Week 2): Pt will complete toilet transfers with max assist of 1 caregiver OT Short Term Goal 2 (Week 2): Pt will don pants with max assist of 1 caregiver OT Short Term Goal 3 (Week 2): Pt will complete bathing with min assist OT Short Term Goal 4 (Week 2): Pt will scan to Lt to obtain items during self-care tasks with min cues  Skilled Therapeutic Interventions/Progress Updates:    Pt seen for OT session focusing on functional mobility and education. Pt resting in supine upon arrival. Initial complaints of nausea, however, with encouragement, pt willing to attempt EOB activity. Reports no pain. She transferred to EOB with mod A using hospital bed functions. Initially min A sitting balance EOB, however progressing to supervision. Upon sitting EOB, complaints of dizziness, BP 121/66. Completed gentle ROM to L UE. Pt demonstrates weak gross grasp abilities with L hand, finger flexion in all digits, however, unable to extend. Education provided regarding stroke recovery, rate of return, and importance/benefits of incorporating L UE into functional tasks. PTt able to demonstrate self ROM exercises.  Pt willing to transfer into w/c, opting to use sliding board. +2 required to place board and stabilize, however, overall min A for transfer with VCs for head/hip relationship and technique. Pt left seated in w/c at end of session, chair belt on and all needs in reach.   Therapy Documentation Precautions:  Precautions Precautions: Fall Precaution Comments: L hemi, L inattention/field cut Restrictions Weight Bearing Restrictions: No  See Function Navigator for Current Functional Status.   Therapy/Group: Individual  Therapy  Andersson Larrabee L 07/30/2018, 12:19 PM

## 2018-07-30 NOTE — Progress Notes (Signed)
Patient cathed at 8 hour mark rather than 6 hour mark for one occurrence this shift after volume 150 at 6 hour mark per discussion with PA. Patient up to attempt void but unable. 300 mL out at 8 hours.

## 2018-07-31 ENCOUNTER — Inpatient Hospital Stay (HOSPITAL_COMMUNITY): Payer: Medicare Other | Admitting: Physical Therapy

## 2018-07-31 DIAGNOSIS — I639 Cerebral infarction, unspecified: Secondary | ICD-10-CM

## 2018-07-31 DIAGNOSIS — I615 Nontraumatic intracerebral hemorrhage, intraventricular: Secondary | ICD-10-CM

## 2018-07-31 NOTE — Progress Notes (Signed)
Subjective/Complaints: Patient seen lying in bed this morning. She states she slept well overnight. She notes left partial knee pain.  ROS: + left lower extremity pain. Denies CP, SOB, N/V/D   Objective: Vital Signs: Blood pressure (!) 115/55, pulse (!) 57, temperature 97.8 F (36.6 C), resp. rate 16, height 5\' 6"  (1.676 m), weight 89 kg, SpO2 97 %. No results found. No results found for this or any previous visit (from the past 72 hour(s)).   Constitutional: No distress . Vital signs reviewed. HENT: Normocephalic.  Atraumatic. Eyes: EOMI. No discharge. Cardiovascular: RRR. No JVD. Respiratory: CTA Bilaterally. Normal effort. GI: BS +. Non-distended. Musc: No edema or tenderness in extremities. Skin:   Intact. Warm and dry. Neuro:  Motor 0/5 in LUE/LLE with increased tone Psych: Flat  Assessment/Plan: 1. 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 Physiatrist is providing close team supervision and 24 hour management of active medical problems listed below. Physiatrist and rehab team continue to assess barriers to discharge/monitor patient progress toward functional and medical goals. FIM: Function - Bathing Position: Shower Body parts bathed by patient: Left arm, Chest, Abdomen, Front perineal area, Buttocks, Right upper leg, Left upper leg Body parts bathed by helper: Right arm, Right lower leg, Left lower leg, Back Bathing not applicable: Front perineal area, Buttocks Assist Level: Touching or steadying assistance(Pt > 75%)(Mod assist)  Function- Upper Body Dressing/Undressing What is the patient wearing?: Pull over shirt/dress, Bra Bra - Perfomed by patient: Thread/unthread right bra strap Bra - Perfomed by helper: Thread/unthread left bra strap, Hook/unhook bra (pull down sports bra) Pull over shirt/dress - Perfomed by patient:  Thread/unthread right sleeve, Put head through opening Pull over shirt/dress - Perfomed by helper: Thread/unthread left sleeve, Pull shirt over trunk Assist Level: (Max assist) Function - Lower Body Dressing/Undressing What is the patient wearing?: Pants, Non-skid slipper socks, Ted Hose Position: (recliner) Pants- Performed by patient: Thread/unthread right pants leg, Thread/unthread left pants leg Pants- Performed by helper: Thread/unthread right pants leg, Thread/unthread left pants leg, Pull pants up/down Non-skid slipper socks- Performed by helper: Don/doff right sock, Don/doff left sock TED Hose - Performed by helper: Don/doff right TED hose, Don/doff left TED hose Assist for footwear: Maximal assist Assist for lower body dressing: 2 Helpers(Stedy)  Function - Toileting Toileting steps completed by patient: Performs perineal hygiene Toileting steps completed by helper: Adjust clothing prior to toileting, Performs perineal hygiene, Adjust clothing after toileting Toileting Assistive Devices: Grab bar or rail Assist level: Two helpers  Function - Air cabin crew transfer activity did not occur: Safety/medical concerns Toilet transfer assistive device: Elevated toilet seat/BSC over toilet Mechanical lift: Stedy Assist level to toilet: 2 helpers Assist level from toilet: 2 helpers Assist level to bedside commode (at bedside): 2 helpers Assist level from bedside commode (at bedside): 2 helpers  Function - Chair/bed transfer Chair/bed transfer method: Lateral scoot, Squat pivot Chair/bed transfer assist level: 2 helpers(max assist + 2nd person to stabilize board) Chair/bed transfer assistive device: Sliding board, Armrests Mechanical lift: Stedy Chair/bed transfer details: Manual facilitation for placement, Manual facilitation for weight shifting, Verbal cues for technique, Verbal cues for sequencing, Tactile cues for sequencing, Tactile cues for weight shifting, Tactile cues  for posture, Tactile cues for placement, Verbal cues for precautions/safety  Function - Locomotion: Wheelchair Will patient use wheelchair at discharge?: Yes Type: Manual Max wheelchair distance: 100 ft Assist Level: Supervision or  verbal cues Assist Level: Supervision or verbal cues Wheel 150 feet activity did not occur: Safety/medical concerns Assist Level: Moderate assistance (Pt 50 - 74%) Turns around,maneuvers to table,bed, and toilet,negotiates 3% grade,maneuvers on rugs and over doorsills: No Function - Locomotion: Ambulation Assistive device: Rail in hallway Max distance: 7 ft Assist level: 2 helpers(max assist + w/c follow) Assist level: 2 helpers Walk 50 feet with 2 turns activity did not occur: Safety/medical concerns Walk 150 feet activity did not occur: Safety/medical concerns Walk 10 feet on uneven surfaces activity did not occur: Safety/medical concerns  Function - Comprehension Comprehension: Auditory Comprehension assist level: Follows basic conversation/direction with no assist  Function - Expression Expression: Verbal Expression assist level: Expresses basic needs/ideas: With no assist  Function - Social Interaction Social Interaction assist level: Interacts appropriately with others with medication or extra time (anti-anxiety, antidepressant).  Function - Problem Solving Problem solving assist level: Solves complex 90% of the time/cues < 10% of the time  Function - Memory Memory assist level: More than reasonable amount of time Patient normally able to recall (first 3 days only): Current season, That he or she is in a hospital, Location of own room, Staff names and faces  Medical Problem List and Plan: 1. Decline in functional status due to resultant left-sided weakness with sensory deficits and left homonymous  hemianopsia secondary to large Right occipitoparietal ICH  Cont CIR  .Marland Kitchen2.  DVT Prophylaxis/Anticoagulation: Mechanical: Sequential compression  devices, below knee Bilateral lower extremities 3. Pain Management: Continue Tylenol as needed for headaches 4. Mood: LCSW to follow for evaluation and support, neuropsych to follow , on Zoloft 50mg  Slept well 5. Neuropsych: This patient is capable of making decisions on her own behalf. 6. Skin/Wound Care: Routine pressure relief measures 7. Fluids/Electrolytes/Nutrition: Monitor I's and O's.  ask dietary to see for dietary, needs set up int sup for meals choices  BMP within acceptable range on 9/9 8. HTN: Monitor blood pressure twice daily controlled 9/14 Vitals:   07/30/18 1939 07/31/18 0411  BP: (!) 130/49 (!) 115/55  Pulse: 64 (!) 57  Resp: 16 16  Temp: 98.2 F (36.8 C) 97.8 F (36.6 C)  SpO2: 97% 97%  9.  PAF: Monitor heart rate twice daily-- appears in NSR on Tambocor and metoprolol twice daily.Bradycardia improved metoprolol 12.5mg  BID and monitor, 10. Constipation: MiraLAX today.  May need to increase senna to twice daily 11.  Urinary retention: Removed Foley today and start voiding trial.  Monitor voiding with PVR checks.    E coli >100K on cath urine  Macrodantin completed  LOS (Days) 12 A FACE TO FACE EVALUATION WAS PERFORMED  [name] [name] 07/31/2018, 8:44 AM

## 2018-07-31 NOTE — Progress Notes (Signed)
Occupational Therapy Session Note  Patient Details  Name: [name] MRN: [id number] Date of Birth: [birth year]  Today's Date: 07/31/2018 OT Individual Time: 1300-1400 OT Individual Time Calculation (min): 60 min    Short Term Goals: Week 2:  OT Short Term Goal 1 (Week 2): Pt will complete toilet transfers with max assist of 1 caregiver OT Short Term Goal 2 (Week 2): Pt will don pants with max assist of 1 caregiver OT Short Term Goal 3 (Week 2): Pt will complete bathing with min assist OT Short Term Goal 4 (Week 2): Pt will scan to Lt to obtain items during self-care tasks with min cues  Skilled Therapeutic Interventions/Progress Updates:    Pt resting in bed upon arrival and "eager" to get OOB. Pt required mod A for supine>sit EOB and steady A for sitting balance EOB to don shoes.  Pt required min A for slide board transfer to w/c.  Pt requested to wash face, brush teeth, and change shirt and don bra.  Pt completed grooming tasks with setup only.  Pt required max A for donning bra and mod A for donning pullover shirt.  Pt transitioned to gym and used slide board for transfer to mat.  Pt maintained sitting balance EOM with supervision. Pt initially engaged in Orin tasks through [place] to facilitate increase LUE function.  Trace grasp noted.  Pt reported feeling like she was having a "hot flash." BP 106/70. Pt transferred back to w/c and returned to room.  Pt requested to remain up in recliner and transferred to recliner with Stedy (min A for sit>stand). Pt remained seated in recliner with all needs within reach.   Therapy Documentation Precautions:  Precautions Precautions: Fall Precaution Comments: L hemi, L inattention/field cut Restrictions Weight Bearing Restrictions: No Pain:  Pt c/o HA; RN aware and meds admin. Pt also c/o chronic L ankle tenderness  See Function Navigator for Current Functional Status.   Therapy/Group: Individual Therapy  Leroy Libman 07/31/2018, 2:39 PM

## 2018-08-01 ENCOUNTER — Inpatient Hospital Stay (HOSPITAL_COMMUNITY): Payer: Medicare Other | Admitting: Occupational Therapy

## 2018-08-01 DIAGNOSIS — G8110 Spastic hemiplegia affecting unspecified side: Secondary | ICD-10-CM

## 2018-08-01 NOTE — Progress Notes (Signed)
Subjective/Complaints: Patient seen lying in bed this AM.  She states she slept well overnight.    ROS: Denies CP, SOB, N/V/D   Objective: Vital Signs: Blood pressure (!) 145/54, pulse (!) 56, temperature 97.9 F (36.6 C), resp. rate 16, height 5\' 6"  (1.676 m), weight 89 kg, SpO2 97 %. No results found. No results found for this or any previous visit (from the past 72 hour(s)).   Constitutional: No distress . Vital signs reviewed. HENT: Normocephalic.  Atraumatic. Eyes: EOMI. No discharge. Cardiovascular: RRR. No JVD. Respiratory: CTA bilaterally. Normal effort. GI: BS +. Non-distended. Musc: No edema or tenderness in extremities. Skin:   Intact. Warm and dry. Neuro:  Motor: LUE 1-1+/5 proximal distal Left lower extremity: Hip flexion, knee extension 3 -/5, ankle dorsiflexion 0/5 Increased tone Psych: Flat  Assessment/Plan: 1. 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 Physiatrist is providing close team supervision and 24 hour management of active medical problems listed below. Physiatrist and rehab team continue to assess barriers to discharge/monitor patient progress toward functional and medical goals. FIM: Function - Bathing Position: Shower Body parts bathed by patient: Left arm, Chest, Abdomen, Front perineal area, Buttocks, Right upper leg, Left upper leg Body parts bathed by helper: Right arm, Right lower leg, Left lower leg, Back Bathing not applicable: Front perineal area, Buttocks Assist Level: Touching or steadying assistance(Pt > 75%)(Mod assist)  Function- Upper Body Dressing/Undressing What is the patient wearing?: Pull over shirt/dress, Bra Bra - Perfomed by patient: Thread/unthread right bra strap Bra - Perfomed by helper: Thread/unthread left bra strap, Hook/unhook bra (pull down sports bra) Pull over  shirt/dress - Perfomed by patient: Thread/unthread right sleeve, Put head through opening Pull over shirt/dress - Perfomed by helper: Thread/unthread left sleeve, Pull shirt over trunk Assist Level: (Max assist) Function - Lower Body Dressing/Undressing What is the patient wearing?: Pants, Non-skid slipper socks, Ted Hose Position: (recliner) Pants- Performed by patient: Thread/unthread right pants leg, Thread/unthread left pants leg Pants- Performed by helper: Thread/unthread right pants leg, Thread/unthread left pants leg, Pull pants up/down Non-skid slipper socks- Performed by helper: Don/doff right sock, Don/doff left sock TED Hose - Performed by helper: Don/doff right TED hose, Don/doff left TED hose Assist for footwear: Maximal assist Assist for lower body dressing: 2 Helpers(Stedy)  Function - Toileting Toileting steps completed by patient: Performs perineal hygiene Toileting steps completed by helper: Adjust clothing prior to toileting, Performs perineal hygiene, Adjust clothing after toileting Toileting Assistive Devices: Grab bar or rail Assist level: Two helpers  Function - Air cabin crew transfer activity did not occur: Safety/medical concerns Toilet transfer assistive device: Elevated toilet seat/BSC over toilet Mechanical lift: Stedy Assist level to toilet: 2 helpers Assist level from toilet: 2 helpers Assist level to bedside commode (at bedside): 2 helpers Assist level from bedside commode (at bedside): 2 helpers  Function - Chair/bed transfer Chair/bed transfer method: Lateral scoot, Squat pivot Chair/bed transfer assist level: 2 helpers(max assist + 2nd person to stabilize board) Chair/bed transfer assistive device: Sliding board, Armrests Mechanical lift: Stedy Chair/bed transfer details: Manual facilitation for placement, Manual facilitation for weight shifting, Verbal cues for technique, Verbal cues for sequencing, Tactile cues for sequencing, Tactile  cues for weight shifting, Tactile cues for posture, Tactile cues for placement, Verbal cues for precautions/safety  Function - Locomotion: Wheelchair Will patient use wheelchair at discharge?: Yes Type: Manual Max wheelchair distance: 100 ft  Assist Level: Supervision or verbal cues Assist Level: Supervision or verbal cues Wheel 150 feet activity did not occur: Safety/medical concerns Assist Level: Moderate assistance (Pt 50 - 74%) Turns around,maneuvers to table,bed, and toilet,negotiates 3% grade,maneuvers on rugs and over doorsills: No Function - Locomotion: Ambulation Assistive device: Rail in hallway Max distance: 7 ft Assist level: 2 helpers(max assist + w/c follow) Assist level: 2 helpers Walk 50 feet with 2 turns activity did not occur: Safety/medical concerns Walk 150 feet activity did not occur: Safety/medical concerns Walk 10 feet on uneven surfaces activity did not occur: Safety/medical concerns  Function - Comprehension Comprehension: Auditory Comprehension assist level: Follows basic conversation/direction with no assist  Function - Expression Expression: Verbal Expression assist level: Expresses basic needs/ideas: With no assist  Function - Social Interaction Social Interaction assist level: Interacts appropriately with others with medication or extra time (anti-anxiety, antidepressant).  Function - Problem Solving Problem solving assist level: Solves complex 90% of the time/cues < 10% of the time  Function - Memory Memory assist level: More than reasonable amount of time Patient normally able to recall (first 3 days only): Current season, That he or she is in a hospital, Location of own room, Staff names and faces  Medical Problem List and Plan: 1. Decline in functional status due to resultant left-sided weakness with sensory deficits and left homonymous  hemianopsia secondary to large Right occipitoparietal ICH  Cont CIR   WHO/PRAFO ordered .2.  DVT  Prophylaxis/Anticoagulation: Mechanical: Sequential compression devices, below knee Bilateral lower extremities 3. Pain Management: Continue Tylenol as needed for headaches 4. Mood: LCSW to follow for evaluation and support, neuropsych to follow , on Zoloft 50mg  Slept well 5. Neuropsych: This patient is capable of making decisions on her own behalf. 6. Skin/Wound Care: Routine pressure relief measures 7. Fluids/Electrolytes/Nutrition: Monitor I's and O's.  ask dietary to see for dietary, needs set up int sup for meals choices -overall improved  BMP within acceptable range on 9/9 8. HTN: Monitor blood pressure twice daily   ? Trending up on 9/15 Vitals:   07/31/18 2055 08/01/18 0545  BP: (!) 135/54 (!) 145/54  Pulse: 63 (!) 56  Resp: 16 16  Temp: 98.1 F (36.7 C) 97.9 F (36.6 C)  SpO2: 94% 97%  9.  PAF: Monitor heart rate twice daily-- appears in NSR on Tambocor and metoprolol twice daily. Bradycardia improved metoprolol 12.5mg  BID and monitor, 10. Constipation: MiraLAX today.  May need to increase senna to twice daily 11.  Urinary retention: Removed Foley today and start voiding trial.  Monitor voiding with PVR checks.    E coli >100K on cath urine  Macrodantin completed  LOS (Days) 13 A FACE TO FACE EVALUATION WAS PERFORMED  [name] Lorie Phenix 08/01/2018, 7:17 AM

## 2018-08-01 NOTE — Progress Notes (Signed)
Occupational Therapy Session Note  Patient Details  Name: [name] MRN: [id number] Date of Birth: 12/18/1945  Today's Date: 08/01/2018 OT Individual Time: 1117-3567 OT Individual Time Calculation (min): 60 min    Short Term Goals: Week 2:  OT Short Term Goal 1 (Week 2): Pt will complete toilet transfers with max assist of 1 caregiver OT Short Term Goal 2 (Week 2): Pt will don pants with max assist of 1 caregiver OT Short Term Goal 3 (Week 2): Pt will complete bathing with min assist OT Short Term Goal 4 (Week 2): Pt will scan to Lt to obtain items during self-care tasks with min cues  Skilled Therapeutic Interventions/Progress Updates:    Treatment session with focus on ADL retraining and dynamic sitting balance during bathing and dressing tasks.  Pt received supine in bed reporting fatigue but willing to engage in bathing at shower level.  Completed bed mobility with mod assist and cues for sequencing to increase independence with mobility.  Utilized Stedy for transfer bed > bench in room shower with +2 for safety.  Pt demonstrating improved postural control both in upright position on Stedy and when sitting on tub bench during bathing.  Pt able to wash lower legs and feet in figure 4 position with therapist assisting to obtain and maintain position.  Utilized lateral leans to wash buttocks.  UB dressing and grooming tasks completed in sitting on Stedy seat with tactile cues for upright posture.  Completed LB dressing with focus on threading pant legs while seated in figure 4 position, however pt requiring increased time and assist.  +2 sit > stand to pull pants over hips with pt demonstrating improved anterior weight shift to come to standing; still requiring assistance to pull pants over hips.  Pt left upright in recliner with all needs in reach.  Therapy Documentation Precautions:  Precautions Precautions: Fall Precaution Comments: L hemi, L inattention/field  cut Restrictions Weight Bearing Restrictions: No General:   Vital Signs: Therapy Vitals Temp: 97.9 F (36.6 C) Pulse Rate: (!) 56 Resp: 16 BP: (!) 145/54 Patient Position (if appropriate): Lying Oxygen Therapy SpO2: 97 % O2 Device: Room Air Pain: Pain Assessment Pain Scale: 0-10 Pain Score: 6  Pain Type: Acute pain Pain Location: Leg Pain Orientation: Left Pain Descriptors / Indicators: Spasm Pain Frequency: Intermittent Patients Stated Pain Goal: 2 Pain Intervention(s): Medication (See eMAR)  See Function Navigator for Current Functional Status.   Therapy/Group: Individual Therapy  [name] 08/01/2018, 9:35 AM

## 2018-08-02 ENCOUNTER — Inpatient Hospital Stay (HOSPITAL_COMMUNITY): Payer: Medicare Other | Admitting: Occupational Therapy

## 2018-08-02 ENCOUNTER — Inpatient Hospital Stay (HOSPITAL_COMMUNITY): Payer: Medicare Other | Admitting: Physical Therapy

## 2018-08-02 ENCOUNTER — Inpatient Hospital Stay (HOSPITAL_COMMUNITY): Payer: Medicare Other

## 2018-08-02 LAB — BASIC METABOLIC PANEL
Anion gap: 10 (ref 5–15)
BUN: 16 mg/dL (ref 8–23)
CALCIUM: 10.6 mg/dL — AB (ref 8.9–10.3)
CO2: 28 mmol/L (ref 22–32)
CREATININE: 0.95 mg/dL (ref 0.44–1.00)
Chloride: 102 mmol/L (ref 98–111)
GFR calc Af Amer: >60 mL/min (ref >60)
GFR, EST NON AFRICAN AMERICAN: 59 mL/min — AB (ref >60)
GLUCOSE: 113 mg/dL — AB (ref 70–99)
Potassium: 4.4 mmol/L (ref 3.5–5.1)
SODIUM: 140 mmol/L (ref 135–145)

## 2018-08-02 LAB — CBC
HCT: 43.9 % (ref 36.0–46.0)
Hemoglobin: 14.4 g/dL (ref 12.0–15.0)
MCH: 29.5 pg (ref 26.0–34.0)
MCHC: 32.8 g/dL (ref 30.0–36.0)
MCV: 90 fL (ref 78.0–100.0)
PLATELETS: 253 10*3/uL (ref 150–400)
RBC: 4.88 MIL/uL (ref 3.87–5.11)
RDW: 12.5 % (ref 11.5–15.5)
WBC: 5.8 10*3/uL (ref 4.0–10.5)

## 2018-08-02 NOTE — Progress Notes (Signed)
Occupational Therapy Session Note  Patient Details  Name: [name] MRN: [id number] Date of Birth: 06-12-1946  Today's Date: 08/02/2018 OT Individual Time: 7858-8502 OT Individual Time Calculation (min): 46 min    Short Term Goals: Week 2:  OT Short Term Goal 1 (Week 2): Pt will complete toilet transfers with max assist of 1 caregiver OT Short Term Goal 2 (Week 2): Pt will don pants with max assist of 1 caregiver OT Short Term Goal 3 (Week 2): Pt will complete bathing with min assist OT Short Term Goal 4 (Week 2): Pt will scan to Lt to obtain items during self-care tasks with min cues  Skilled Therapeutic Interventions/Progress Updates:    Pt presents supine in bed, reporting increased fatigue but agreeable to therapy session. No c/o pain. Pt reports needing to toilet, performing bed mobility with modA, sit<>stand at [facility] with modA (+2 for safety) and use of Stedy for transfer to [facility] over toilet. Pt requiring totalA+2 for clothing management and peri-care after BM, maxA to rise from surface height of BSC to [facility] during sit<>stand. After return to EOB pt engaged in UB bathing/dressing. Pt completing with overall minA to wash RUE. Pt dons overhead shirt with overall modA, initiates use of hemi-technique though requires assist to properly initiate threading sleeve over LUE. Additional focus on maintaining upright sitting posture at midline during ADL tasks, pt able to achieve midline without physical assist though does require cues to do so. Pt left supine in bed end of session with bed alarm set, call bell and needs within reach.   Therapy Documentation Precautions:  Precautions Precautions: Fall Precaution Comments: L hemi, L inattention/field cut Restrictions Weight Bearing Restrictions: No   Pain: Pain Assessment Pain Score: 0-No pain     See Function Navigator for Current Functional Status.   Therapy/Group: Individual Therapy  Raymondo Band 08/02/2018, 12:48  PM

## 2018-08-02 NOTE — Progress Notes (Signed)
Physical Therapy Session Note  Patient Details  Name: [name] MRN: [id number] Date of Birth: 10/12/1946  Today's Date: 08/02/2018 PT Individual Time: 0805-0901 PT Individual Time Calculation (min): 56 min   Short Term Goals: Week 2:  PT Short Term Goal 1 (Week 2): Pt will consistently complete bed<>w/c transfers with max assist +1. PT Short Term Goal 2 (Week 2): Pt will propel w/c 75 ft with supervision. PT Short Term Goal 3 (Week 2): Pt will complete bed mobility with mod assist.  Skilled Therapeutic Interventions/Progress Updates:  Pt received in bed, emotional & tearful with therapist providing emotional support and encouragement. Pt reports HA & meds requested & administered during session. Therapist & tech donned thigh high ted hose & shoes total assist. Pt transferred supine>sitting EOB with light mod assist to transfer LLE to EOB and upright trunk with use of hospital bed features. Pt completes slide board transfer to w/c on R with min assist. Pt propels w/c around unit with supervision and cuing for directions around unit and L attention to environment and obstacles. Pt completed car transfer at small SUV height with min/mod assist overall via slide board with assistance to place LLE in/out of car and cuing and assist with head hips relationship for scooting back in seat. Provided pt with home measurement sheet and asked pt to have family complete this. Transitioned to gait at rail. Pt requires max assist for sit<>stand and cuing for hand placement on w/c armrest. Pt ambulates 30 ft with rail, min/mod assist & w/c follow for safety with pt able to advance LLE and stabilize without any buckling noted. Pt required verbal cuing for increased L foot clearance and occasional assist for LLE foot advancement. Transitioned to RW with L hand split & pt able to ambulate 6 ft with max assist with more assistance for stabilizing and steering RW. Pt required rest break 2/2 fatigue & SOB. At end  of session pt left sitting in w/c with rehab tech assisting her back to room.   Therapy Documentation Precautions:  Precautions Precautions: Fall Precaution Comments: L hemi, L inattention/field cut Restrictions Weight Bearing Restrictions: No   See Function Navigator for Current Functional Status.   Therapy/Group: Individual Therapy  [name] 08/02/2018, 9:18 AM

## 2018-08-02 NOTE — Progress Notes (Signed)
Subjective/Complaints: No new issues, starting to move Left hand more  ROS: Denies CP, SOB, N/V/D   Objective: Vital Signs: Blood pressure (!) 143/53, pulse 60, temperature 97.6 F (36.4 C), temperature source Oral, resp. rate 18, height 5\' 6"  (1.676 m), weight 89 kg, SpO2 100 %. No results found. Results for orders placed or performed during the hospital encounter of 07/19/18 (from the past 72 hour(s))  CBC     Status: None   Collection Time: 08/02/18  6:24 AM  Result Value Ref Range   WBC 5.8 4.0 - 10.5 K/uL   RBC 4.88 3.87 - 5.11 MIL/uL   Hemoglobin 14.4 12.0 - 15.0 g/dL   HCT 43.9 36.0 - 46.0 %   MCV 90.0 78.0 - 100.0 fL   MCH 29.5 26.0 - 34.0 pg   MCHC 32.8 30.0 - 36.0 g/dL   RDW 12.5 11.5 - 15.5 %   Platelets 253 150 - 400 K/uL    Comment: Performed at [facility] [address], [place], [place] [address]     Constitutional: No distress . Vital signs reviewed. HENT: Normocephalic.  Atraumatic. Eyes: EOMI. No discharge. Cardiovascular: RRR. No JVD. Respiratory: CTA bilaterally. Normal effort. GI: BS +. Non-distended. Musc: No edema or tenderness in extremities. Skin:   Intact. Warm and dry. Neuro:  Motor: LUE 1-1+/5 proximal distal Left lower extremity: Hip flexion, knee extension 3 -/5, ankle dorsiflexion 0/5 Increased tone hamstring LUE + grasp and release Psych: Flat  Assessment/Plan: 1. 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 Physiatrist is providing close team supervision and 24 hour management of active medical problems listed below. Physiatrist and rehab team continue to assess barriers to discharge/monitor patient progress toward functional and medical goals. FIM: Function - Bathing Position: Shower Body parts bathed by patient: Left arm, Chest, Abdomen, Front perineal area, Buttocks, Right upper  leg, Left upper leg, Right lower leg, Left lower leg Body parts bathed by helper: Right arm, Back Bathing not applicable: Front perineal area, Buttocks Assist Level: Touching or steadying assistance(Pt > 75%)  Function- Upper Body Dressing/Undressing What is the patient wearing?: Pull over shirt/dress, Bra Bra - Perfomed by patient: Thread/unthread right bra strap Bra - Perfomed by helper: Thread/unthread left bra strap, Hook/unhook bra (pull down sports bra) Pull over shirt/dress - Perfomed by patient: Thread/unthread right sleeve, Put head through opening Pull over shirt/dress - Perfomed by helper: Thread/unthread left sleeve, Pull shirt over trunk Assist Level: (Max assist) Function - Lower Body Dressing/Undressing What is the patient wearing?: Pants, Shoes Position: (recliner) Pants- Performed by patient: Thread/unthread left pants leg Pants- Performed by helper: Thread/unthread right pants leg, Pull pants up/down Non-skid slipper socks- Performed by helper: Don/doff right sock, Don/doff left sock Shoes - Performed by helper: Don/doff right shoe, Don/doff left shoe TED Hose - Performed by helper: Don/doff right TED hose, Don/doff left TED hose Assist for footwear: Maximal assist Assist for lower body dressing: 2 Helpers  Function - Toileting Toileting steps completed by patient: Performs perineal hygiene Toileting steps completed by helper: Adjust clothing prior to toileting, Performs perineal hygiene, Adjust clothing after toileting Toileting Assistive Devices: Grab bar or rail Assist level: Two helpers  Function - Air cabin crew transfer activity did not occur: Safety/medical concerns Toilet transfer assistive device: Elevated toilet seat/BSC over toilet Mechanical lift: Stedy Assist level to toilet: 2 helpers Assist level from toilet: 2 helpers  Assist level to bedside commode (at bedside): 2 helpers Assist level from bedside commode (at bedside): 2  helpers  Function - Chair/bed transfer Chair/bed transfer method: Lateral scoot, Squat pivot Chair/bed transfer assist level: 2 helpers(max assist + 2nd person to stabilize board) Chair/bed transfer assistive device: Sliding board, Armrests Mechanical lift: Stedy Chair/bed transfer details: Manual facilitation for placement, Manual facilitation for weight shifting, Verbal cues for technique, Verbal cues for sequencing, Tactile cues for sequencing, Tactile cues for weight shifting, Tactile cues for posture, Tactile cues for placement, Verbal cues for precautions/safety  Function - Locomotion: Wheelchair Will patient use wheelchair at discharge?: Yes Type: Manual Max wheelchair distance: 100 ft Assist Level: Supervision or verbal cues Assist Level: Supervision or verbal cues Wheel 150 feet activity did not occur: Safety/medical concerns Assist Level: Moderate assistance (Pt 50 - 74%) Turns around,maneuvers to table,bed, and toilet,negotiates 3% grade,maneuvers on rugs and over doorsills: No Function - Locomotion: Ambulation Assistive device: Rail in hallway Max distance: 7 ft Assist level: 2 helpers(max assist + w/c follow) Assist level: 2 helpers Walk 50 feet with 2 turns activity did not occur: Safety/medical concerns Walk 150 feet activity did not occur: Safety/medical concerns Walk 10 feet on uneven surfaces activity did not occur: Safety/medical concerns  Function - Comprehension Comprehension: Auditory Comprehension assist level: Follows basic conversation/direction with no assist  Function - Expression Expression: Verbal Expression assist level: Expresses basic needs/ideas: With no assist  Function - Social Interaction Social Interaction assist level: Interacts appropriately with others with medication or extra time (anti-anxiety, antidepressant).  Function - Problem Solving Problem solving assist level: Solves complex 90% of the time/cues < 10% of the time  Function -  Memory Memory assist level: More than reasonable amount of time Patient normally able to recall (first 3 days only): Current season, That he or she is in a hospital, Location of own room, Staff names and faces  Medical Problem List and Plan: 1. Decline in functional status due to resultant left-sided weakness with sensory deficits and left homonymous  hemianopsia secondary to large Right occipitoparietal ICH  Cont CIR PT, OT SLP Improved Left hand grasp/release  WHO/PRAFO ordered .2.  DVT Prophylaxis/Anticoagulation: Mechanical: Sequential compression devices, below knee Bilateral lower extremities 3. Pain Management: Continue Tylenol as needed for headaches 4. Mood: LCSW to follow for evaluation and support, neuropsych to follow , on Zoloft 50mg  Slept well 5. Neuropsych: This patient is capable of making decisions on her own behalf. 6. Skin/Wound Care: Routine pressure relief measures 7. Fluids/Electrolytes/Nutrition: Monitor I's and O's.  ask dietary to see for dietary, needs set up int sup for meals choices -overall improved  BMP within acceptable range on 9/9 8. HTN: Monitor blood pressure twice daily   ? Trending up on 9/15 Vitals:   08/01/18 2030 08/02/18 0330  BP: (!) 131/50 (!) 143/53  Pulse: (!) 56 60  Resp: 18 18  Temp: (!) 97.5 F (36.4 C) 97.6 F (36.4 C)  SpO2: 99% 100%  9.  PAF: Monitor heart rate twice daily-- appears in NSR on Tambocor and metoprolol twice daily. Bradycardia improved metoprolol 12.5mg  BID and monitor, 10. Constipation: MiraLAX today.  May need to increase senna to twice daily 11.  Urinary retention: Monitor voiding with PVR checks.    Starting to void!  LOS (Days) [place] PERFORMED  Charlett Blake 08/02/2018, 7:53 AM

## 2018-08-02 NOTE — Progress Notes (Signed)
Occupational Therapy Session Note  Patient Details  Name: [name] MRN: [id number] Date of Birth: 08/12/1946  Today's Date: 08/02/2018 OT Individual Time: 0930-1000 OT Individual Time Calculation (min): 30 min    Short Term Goals: Week 1:  OT Short Term Goal 1 (Week 1): Pt will complete LB bathing with max assist OT Short Term Goal 1 - Progress (Week 1): Met OT Short Term Goal 2 (Week 1): Pt will complete toilet transfers with max assist of 1 caregiver OT Short Term Goal 2 - Progress (Week 1): Progressing toward goal OT Short Term Goal 3 (Week 1): Pt will don pants with max assist of 1 caregiver OT Short Term Goal 3 - Progress (Week 1): Progressing toward goal Week 2:  OT Short Term Goal 1 (Week 2): Pt will complete toilet transfers with max assist of 1 caregiver OT Short Term Goal 2 (Week 2): Pt will don pants with max assist of 1 caregiver OT Short Term Goal 3 (Week 2): Pt will complete bathing with min assist OT Short Term Goal 4 (Week 2): Pt will scan to Lt to obtain items during self-care tasks with min cues     Skilled Therapeutic Interventions/Progress Updates:    Pt stated she had been struggling with a headache and discussed therapy plan.  She has another OT session this afternoon and can B/d at that time as she still had on her clothing from yesterday. Pt completed grooming at the sink with set up and then was taken to the day room to work on LUE NMR activities.  Pt now has 80% of finger flexion and extension AROM with partial wrist extension.  Increased tone noted in her elbow flexors.  Pt positioned at table to work on table top towel slides with A to move through full sh flex and extension, elbow flex and ext, and circular rotations.  Pt was able to actively grasp and release the towel. She then worked on picking up small checkers using tip pinch with A to move arm from lap to table to move checkers, she could actively open fingers to release checkers.  Good attention  to L arm with the activity. Pt taken to speech therapy for her next session.  Therapy Documentation Precautions:  Precautions Precautions: Fall Precaution Comments: L hemi, L inattention/field cut Restrictions Weight Bearing Restrictions: No    Vital Signs: Therapy Vitals Pulse Rate: 91 BP: (!) 124/95 Pain: Pain Assessment Pain Scale: 0-10 Pain Score: 5  Pain Type: Acute pain Pain Location: Head Pain Orientation: Right;Left Pain Descriptors / Indicators: Aching;Headache Pain Frequency: Intermittent Pain Onset: Gradual Pain Intervention(s): Medication (See eMAR) ADL:    See Function Navigator for Current Functional Status.   Therapy/Group: Individual Therapy  SAGUIER,JULIA 08/02/2018, 9:00 AM 

## 2018-08-02 NOTE — Progress Notes (Signed)
Speech Language Pathology Weekly Progress and Session Note  Patient Details  Name: [name] MRN: [id number] Date of Birth: 1946-08-26  Beginning of progress report period: July 26, 2018 End of progress report period: August 02, 2018  Today's Date: 08/02/2018 SLP Individual Time: 1000-1100 SLP Individual Time Calculation (min): 60 min  Short Term Goals: Week 2: SLP Short Term Goal 1 (Week 2): Patient will demonstrate functional problem solving for mildly complex tasks with supervision verbal cues. SLP Short Term Goal 1 - Progress (Week 2): Not met SLP Short Term Goal 2 (Week 2): Patient will attend to/locate items in left field of enviornment during functional tasks with Min A verbal cues.  SLP Short Term Goal 2 - Progress (Week 2): Met SLP Short Term Goal 3 (Week 2): Patient will self-monitor and correct errors during functional tasks with supervision verbal cues.  SLP Short Term Goal 3 - Progress (Week 2): Not met    New Short Term Goals: Week 3: SLP Short Term Goal 1 (Week 3): Patient will demonstrate functional problem solving for mildly complex tasks with supervision verbal cues. SLP Short Term Goal 2 (Week 3): Patient will self-monitor and correct errors during functional tasks with supervision verbal cues.  SLP Short Term Goal 3 (Week 3): Patient will attend to/locate items in left field of enviornment during functional tasks with Mod I A verbal cues.  SLP Short Term Goal 4 (Week 3): Patient will demonstrate anticipatory awareness by listing 3 safe and 3 unsafe activties upon discharge with supervision A question and verbal cues  Weekly Progress Updates: Pt made initially slow progress meeting 1 out 3 goals, currently requiring min A. Pt demonstrated increase in left attention during structured tasks and anticipatory awareness. Pt demonstrated increase in semi-complex problem solving and error awareness reducing amount of cuing from mod-min A to min- supervision A.  Pt would continue to benefit from skilled ST services in order to maximize functional independence and reduce burden of care requiring 24 hours supervision and continued ST services.     Intensity: Minumum of 1-2 x/day, 30 to 90 minutes Frequency: 3 to 5 out of 7 days Duration/Length of Stay: 9/28 Treatment/Interventions: Cognitive remediation/compensation;Environmental controls;Internal/external aids;Therapeutic Activities;Patient/family education;Cueing hierarchy;Functional tasks   Daily Session  Skilled Therapeutic Interventions: Skilled ST services focused on cognitive skills. SLP facilitated semi-complex problem solving utilzing money management and complex PEG tasks, pt required min A verbal cues and supervision A verbal cues for left attention. SLP facilitated anticipatory awareness skills given leading questions pertaining to reason for SNF recommendation, pt required mod A verbal cues fading to min A verbal cues. Pt was left in room with call bell within reach and chair alarm set. Recommend to continue skilled ST services.       Function:   Eating Eating                 Cognition Comprehension Comprehension assist level: Follows basic conversation/direction with no assist  Expression   Expression assist level: Expresses basic needs/ideas: With no assist  Social Interaction Social Interaction assist level: Interacts appropriately with others with medication or extra time (anti-anxiety, antidepressant).  Problem Solving Problem solving assist level: Solves basic problems with no assist  Memory Memory assist level: More than reasonable amount of time   General    Pain Pain Assessment Pain Score: 0-No pain  Therapy/Group: Individual Therapy  Parthiv Mucci  [facility] 08/02/2018, 12:56 PM

## 2018-08-02 NOTE — Progress Notes (Signed)
Physical Therapy Weekly Progress Note  Patient Details  Name: [name] MRN: [id number] Date of Birth: 01/08/1946  Beginning of progress report period: July 27, 2018 End of progress report period: August 02, 2018  Today's Date: 08/02/2018   Patient has met 1 of 3 short term goals.  Pt has made good progress towards LTG's with a great improvement in functional mobility today. Pt has been requiring up to +2 assist for bed<>w/c transfers even with slide board but today pt was able to complete slide board transfers with min assist. Gait training with rail with pt able to advance LLE and stabilize without any buckling noted during gait with rail therefore transitioned to gait with RW & L hand orthosis with max assist for only 6 ft and therapist providing more assistance for managing RW vs physical assist with gait! Pt would benefit from continued skilled PT treatment to focus on gait training, transfers, L NMR, sitting and standing balance, and endurance training to increase independence prior to d/c.   Patient continues to demonstrate the following deficits muscle weakness, decreased cardiorespiratoy endurance, decreased coordination and decreased motor planning, decreased visual perceptual skills, decreased attention to left and left side neglect, decreased awareness, decreased problem solving, decreased safety awareness, decreased memory and delayed processing, and decreased sitting balance, decreased standing balance, decreased postural control, hemiplegia and decreased balance strategies and therefore will continue to benefit from skilled PT intervention to increase functional independence with mobility.  Patient progressing toward long term goals..  Continue plan of care.  PT Short Term Goals Week 2:  PT Short Term Goal 1 (Week 2): Pt will consistently complete bed<>w/c transfers with max assist +1. PT Short Term Goal 1 - Progress (Week 2): Not Met(min assist with slide board x 1  occasion, otherwise pt requires +2 assist) PT Short Term Goal 2 (Week 2): Pt will propel w/c 75 ft with supervision. PT Short Term Goal 2 - Progress (Week 2): Met PT Short Term Goal 3 (Week 2): Pt will complete bed mobility with mod assist. PT Short Term Goal 3 - Progress (Week 2): Not met Week 3:  PT Short Term Goal 1 (Week 3): Pt will ambulate 30 ft with LRAD & max assist +1. PT Short Term Goal 2 (Week 3): Pt will complete stand pivot transfer bed<>w/c with LRAD. PT Short Term Goal 3 (Week 3): Pt will initiate stair negotiation for strengthening.  PT Short Term Goal 4 (Week 3): Pt will complete bed mobility consistently with mod assist +1.  Therapy Documentation Precautions:  Precautions Precautions: Fall Precaution Comments: L hemi, L inattention/field cut Restrictions Weight Bearing Restrictions: No   See Function Navigator for Current Functional Status.  Therapy/Group: Individual Therapy  [name] 08/02/2018, 3:39 PM

## 2018-08-02 NOTE — Progress Notes (Signed)
Orthopedic Tech Progress Note Patient Details:  [name] 01-26-1946 [id number]  Patient ID: [name], female   DOB: [birth year], 72 y.o.   MRN: [id number]   [name] 08/02/2018, 6:35 AMCalled [facility] for left resting hand splint,cockup splint and PRAFO boot.

## 2018-08-03 ENCOUNTER — Inpatient Hospital Stay (HOSPITAL_COMMUNITY): Payer: Medicare Other | Admitting: Speech Pathology

## 2018-08-03 ENCOUNTER — Inpatient Hospital Stay (HOSPITAL_COMMUNITY): Payer: Medicare Other | Admitting: Occupational Therapy

## 2018-08-03 ENCOUNTER — Inpatient Hospital Stay (HOSPITAL_COMMUNITY): Payer: Medicare Other

## 2018-08-03 MED ORDER — TIZANIDINE HCL 2 MG PO TABS
2.0000 mg | ORAL_TABLET | Freq: Two times a day (BID) | ORAL | Status: DC
  Administered 2018-08-04 – 2018-08-10 (×13): 2 mg via ORAL
  Filled 2018-08-03 (×13): qty 1

## 2018-08-03 MED ORDER — TIZANIDINE HCL 2 MG PO TABS: 2.0000 mg | ORAL_TABLET | Freq: Two times a day (BID) | ORAL | Status: DC

## 2018-08-03 NOTE — Progress Notes (Signed)
Subjective/Complaints: Was able to grasp small objects with left hand yesterday, slept in WHO  ROS: Denies CP, SOB, N/V/D   Objective: Vital Signs: Blood pressure (!) 154/54, pulse (!) 58, temperature 97.6 F (36.4 C), temperature source Oral, resp. rate 15, height _0  (1.676 m), weight 89 kg, SpO2 97 %. No results found. Results for orders placed or performed during the hospital encounter of 07/19/18 (from the past 72 hour(s))  Basic metabolic panel     Status: Abnormal   Collection Time: 08/02/18  6:24 AM  Result Value Ref Range   Sodium 140 135 - 145 mmol/L   Potassium 4.4 3.5 - 5.1 mmol/L   Chloride 102 98 - 111 mmol/L   CO2 28 22 - 32 mmol/L   Glucose, Bld 113 (H) 70 - 99 mg/dL   BUN 16 8 - 23 mg/dL   Creatinine, Ser 0.95 0.44 - 1.00 mg/dL   Calcium 10.6 (H) 8.9 - 10.3 mg/dL   GFR calc non Af Amer 59 (L) >60 mL/min   GFR calc Af Amer >60 >60 mL/min    Comment: (NOTE) The eGFR has been calculated using the CKD EPI equation. This calculation has not been validated in all clinical situations. eGFR's persistently <60 mL/min signify possible Chronic Kidney Disease.    Anion gap 10 5 - 15    Comment: Performed at [facility] [address], [place] [address]  CBC     Status: None   Collection Time: 08/02/18  6:24 AM  Result Value Ref Range   WBC 5.8 4.0 - 10.5 K/uL   RBC 4.88 3.87 - 5.11 MIL/uL   Hemoglobin 14.4 12.0 - 15.0 g/dL   HCT 43.9 36.0 - 46.0 %   MCV 90.0 78.0 - 100.0 fL   MCH 29.5 26.0 - 34.0 pg   MCHC 32.8 30.0 - 36.0 g/dL   RDW 12.5 11.5 - 15.5 %   Platelets 253 150 - 400 K/uL    Comment: Performed at [facility], [place] [address] [place], [place] [address]     Constitutional: No distress . Vital signs reviewed. HENT: Normocephalic.  Atraumatic. Eyes: EOMI. No discharge. Cardiovascular: RRR. No JVD. Respiratory: CTA bilaterally. Normal effort. GI: BS +. Non-distended. Musc: No edema or tenderness in extremities. Skin:    Intact. Warm and dry. Neuro:  Motor: LUE 1-1+/5 proximal distal Left lower extremity: Hip flexion, knee extension 3 -/5, ankle dorsiflexion 0/5 Increased tone hamstring LUE + grasp and release Psych: Flat  Assessment/Plan: 1. 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 Physiatrist is providing close team supervision and 24 hour management of active medical problems listed below. Physiatrist and rehab team continue to assess barriers to discharge/monitor patient progress toward functional and medical goals. FIM: Function - Bathing Position: Shower Body parts bathed by patient: Left arm, Chest, Abdomen, Front perineal area, Buttocks, Right upper leg, Left upper leg, Right lower leg, Left lower leg Body parts bathed by helper: Right arm, Back Bathing not applicable: Front perineal area, Buttocks Assist Level: Touching or steadying assistance(Pt > 75%)  Function- Upper Body Dressing/Undressing What is the patient wearing?: Pull over shirt/dress, Bra Bra - Perfomed by patient: Thread/unthread right bra strap Bra - Perfomed by helper: Thread/unthread left bra strap, Hook/unhook bra (pull down sports bra) Pull over shirt/dress - Perfomed by patient: Thread/unthread right sleeve, Put head through opening Pull  over shirt/dress - Perfomed by helper: Thread/unthread left sleeve, Pull shirt over trunk Assist Level: (Max assist) Function - Lower Body Dressing/Undressing What is the patient wearing?: Pants, Shoes Position: (recliner) Pants- Performed by patient: Thread/unthread left pants leg Pants- Performed by helper: Thread/unthread right pants leg, Pull pants up/down Non-skid slipper socks- Performed by helper: Don/doff right sock, Don/doff left sock Shoes - Performed by helper: Don/doff right shoe, Don/doff left shoe TED Hose - Performed by helper:  Don/doff right TED hose, Don/doff left TED hose Assist for footwear: Maximal assist Assist for lower body dressing: 2 Helpers  Function - Toileting Toileting steps completed by patient: Performs perineal hygiene Toileting steps completed by helper: Adjust clothing prior to toileting, Performs perineal hygiene, Adjust clothing after toileting Toileting Assistive Devices: Grab bar or rail Assist level: Two helpers  Function - Air cabin crew transfer activity did not occur: Safety/medical concerns Toilet transfer assistive device: Elevated toilet seat/BSC over toilet Mechanical lift: Stedy Assist level to toilet: 2 helpers Assist level from toilet: 2 helpers Assist level to bedside commode (at bedside): 2 helpers Assist level from bedside commode (at bedside): 2 helpers  Function - Chair/bed transfer Chair/bed transfer method: Lateral scoot Chair/bed transfer assist level: Touching or steadying assistance (Pt > 75%) Chair/bed transfer assistive device: Sliding board, Armrests Mechanical lift: Stedy Chair/bed transfer details: Verbal cues for technique, Verbal cues for sequencing  Function - Locomotion: Wheelchair Will patient use wheelchair at discharge?: Yes Type: Manual Max wheelchair distance: 150 ft  Assist Level: Supervision or verbal cues Assist Level: Supervision or verbal cues Wheel 150 feet activity did not occur: Safety/medical concerns Assist Level: Supervision or verbal cues Turns around,maneuvers to table,bed, and toilet,negotiates 3% grade,maneuvers on rugs and over doorsills: No Function - Locomotion: Ambulation Assistive device: Walker-rolling(RW & L hand orthosis) Max distance: 6 ft  Assist level: 2 helpers(max assist + w/c follow for safety) Assist level: 2 helpers Walk 50 feet with 2 turns activity did not occur: Safety/medical concerns Walk 150 feet activity did not occur: Safety/medical concerns Walk 10 feet on uneven surfaces activity did not  occur: Safety/medical concerns  Function - Comprehension Comprehension: Auditory Comprehension assist level: Follows basic conversation/direction with no assist  Function - Expression Expression: Verbal Expression assist level: Expresses basic needs/ideas: With no assist  Function - Social Interaction Social Interaction assist level: Interacts appropriately with others with medication or extra time (anti-anxiety, antidepressant).  Function - Problem Solving Problem solving assist level: Solves basic problems with no assist  Function - Memory Memory assist level: More than reasonable amount of time Patient normally able to recall (first 3 days only): Current season, That he or she is in a hospital, Location of own room, Staff names and faces  Medical Problem List and Plan: 1. Decline in functional status due to resultant left-sided weakness with sensory deficits and left homonymous  hemianopsia secondary to large Right occipitoparietal ICH  Cont CIR PT, OT SLP Improved Left hand grasp/release  WHO/PRAFO ordered .2.  DVT Prophylaxis/Anticoagulation: Mechanical: Sequential compression devices, below knee Bilateral lower extremities 3. Pain Management: Continue Tylenol as needed for headaches 4. Mood: LCSW to follow for evaluation and support, neuropsych to follow , on Zoloft 1m Slept well 5. Neuropsych: This patient is capable of making decisions on her own behalf. 6. Skin/Wound Care: Routine pressure relief measures 7. Fluids/Electrolytes/Nutrition: Monitor I's and O's.  ask dietary to see for dietary, needs set up int sup for meals choices -overall improved  BMP within acceptable range on 9/9  8. HTN: Monitor blood pressure twice daily   Elevated systolic 1/61 monitor for trend Vitals:   08/02/18 2021 08/03/18 0713  BP: (!) 138/55 (!) 154/54  Pulse: 68 (!) 58  Resp: 18 15  Temp: 97.7 F (36.5 C) 97.6 F (36.4 C)  SpO2: 97% 97%  9.  PAF: Monitor heart rate twice daily--  appears in NSR on Tambocor and metoprolol twice daily. Bradycardia improved metoprolol 12.48m BID and monitor, 10. Constipation: MiraLAX today.  May need to increase senna to twice daily 11.  Urinary retention: Monitor voiding with PVR checks.    Starting to void!  LOS (Days) 1[place]AL[place]FOR[place]  ACharlett Blake9/17/2019, 7:50 AM

## 2018-08-03 NOTE — Progress Notes (Signed)
Patient reported to be having blurry vision. Orthostatic vitals without drop. On exam, patient denies eye pain, dark spots, floaters or flashes--just unable to focus or read.  She reports it started this am after meds. Likely has SE due to Zanaflex hold doses today and monitor. Resume in am at bid or daily depending on improvement.

## 2018-08-03 NOTE — Progress Notes (Signed)
Speech Language Pathology Daily Session Note  Patient Details  Name: [name] MRN: [id number] Date of Birth: [birth year]  Today's Date: 08/03/2018 SLP Individual Time: 1300-1408 SLP Individual Time Calculation (min): 68 min  Short Term Goals: Week 3: SLP Short Term Goal 1 (Week 3): Patient will demonstrate functional problem solving for mildly complex tasks with supervision verbal cues. SLP Short Term Goal 2 (Week 3): Patient will self-monitor and correct errors during functional tasks with supervision verbal cues.  SLP Short Term Goal 3 (Week 3): Patient will attend to/locate items in left field of enviornment during functional tasks with Mod I A verbal cues.  SLP Short Term Goal 4 (Week 3): Patient will demonstrate anticipatory awareness by listing 3 safe and 3 unsafe activties upon discharge with supervision A question and verbal cues  Skilled Therapeutic Interventions: Skilled treatment session focused on cognitive goals. SLP facilitated session by initially providing Mod A verbal cues for visual scanning in regards to an anchor line on left side of paper and visual reference at the beginning and end of each row in order to identify a specific number. Patient initially able to identify 49/52 occurences but eventually identified 51/52 occurences as visual cues faded and only using an anchor line. Patient read at the paragraph level with Mod I and without visual cues. Patient requested to use the bathroom and transferred to the commode via the stedy. Patient was continent of urine but upon standing for self-care, patient had not fully emptied her bladder and continued voiding on her clothing. SLP donned new underwear and pants with total A but with patient verbally directing care. Patient eventually transferred back to bed via the [facility] Patient left supine in bed with alarm on and all needs within reach. Continue with current plan of care.      Function:   Cognition Comprehension  Comprehension assist level: Follows basic conversation/direction with no assist  Expression   Expression assist level: Expresses basic needs/ideas: With no assist  Social Interaction Social Interaction assist level: Interacts appropriately with others with medication or extra time (anti-anxiety, antidepressant).  Problem Solving Problem solving assist level: Solves basic problems with no assist  Memory Memory assist level: More than reasonable amount of time    Pain Pain Assessment Pain Scale: 0-10 Pain Score: 0-No pain Pain Type: Acute pain Pain Location: Head Pain Descriptors / Indicators: Aching Pain Onset: Gradual Patients Stated Pain Goal: 0 Pain Intervention(s): Medication (See eMAR)  Therapy/Group: Individual Therapy  Poonam Woehrle 08/03/2018, 3:46 PM

## 2018-08-03 NOTE — Progress Notes (Signed)
Occupational Therapy Session Note  Patient Details  Name: [name] MRN: [id number] Date of Birth: 1945/11/23  Today's Date: 08/03/2018 OT Individual Time: 1030-1150 OT Individual Time Calculation (min): 80 min    Short Term Goals: Week 1:  OT Short Term Goal 1 (Week 1): Pt will complete LB bathing with max assist OT Short Term Goal 1 - Progress (Week 1): Met OT Short Term Goal 2 (Week 1): Pt will complete toilet transfers with max assist of 1 caregiver OT Short Term Goal 2 - Progress (Week 1): Progressing toward goal OT Short Term Goal 3 (Week 1): Pt will don pants with max assist of 1 caregiver OT Short Term Goal 3 - Progress (Week 1): Progressing toward goal Week 2:  OT Short Term Goal 1 (Week 2): Pt will complete toilet transfers with max assist of 1 caregiver OT Short Term Goal 2 (Week 2): Pt will don pants with max assist of 1 caregiver OT Short Term Goal 3 (Week 2): Pt will complete bathing with min assist OT Short Term Goal 4 (Week 2): Pt will scan to Lt to obtain items during self-care tasks with min cues      Skilled Therapeutic Interventions/Progress Updates:    Pt seen this session for ADL training with a focus on trunk control with upright posture, adaptive techniques and sit to stand skills. Pt felt her vision was out of focus today, PA aware.  Pt received in recliner in a "slumped" position with her hips forward toward edge of seat.  Pt worked on repositioning self using forward trunk flexion and hip lift to scoot back and sit upright to enable her to dress more easily. Pt followed hemidressing techniques to don bra with mod A and shirt with min A.  She worked on sit balance with min A as she donned pants over feet.  Pt was then able to push into stand using her R arm to RW. Pt maintained static stand on RW with min A, she then was able to release R hand and pull pants 80% of the way over her hips with mod A to support balance.  She returned to sit in recliner and then  completed a squat pivot to wc in 2 scoots with cues to forward lean to elevate hips vs sliding them.   Pt completed grooming at the sink with no cues to find items in her L visual field. Pt taken to gym to work on LUE NMR with A/arom table top towel glides.  Prom to wrist extension with gentle wrist mobs as tone is increasing flexor tightness. Pt has a significant amount of finger extension but trace wrist extension.  Pt agreeable to trying estim.  Estim applied to wrist extensors on small muscle atrophy setting for 10 min at intensity 24.  Pt had a good response during estim and practiced grasp and release of holding a cup.  After estim, pt was able to actively extend wrist a few degrees.  Pt taken back to room and L wrist brace applied.  Pt resting in w/c with lunch tray and chair alarm on.      Therapy Documentation Precautions:  Precautions Precautions: Fall Precaution Comments: L hemi, L inattention/field cut Restrictions Weight Bearing Restrictions: No  Pain: Pain Assessment Pain Score: 0-No pain ADL:    See Function Navigator for Current Functional Status.   Therapy/Group: Individual Therapy  [place] 08/03/2018, 1:07 PM

## 2018-08-03 NOTE — Progress Notes (Signed)
Physical Therapy Session Note  Patient Details  Name: [name] MRN: [id number] Date of Birth: [birth year]  Today's Date: 08/03/2018 PT Individual Time: 0800-0900 PT Individual Time Calculation (min): 60 min   Short Term Goals: Week 3:  PT Short Term Goal 1 (Week 3): Pt will ambulate 30 ft with LRAD & max assist +1. PT Short Term Goal 2 (Week 3): Pt will complete stand pivot transfer bed<>w/c with LRAD. PT Short Term Goal 3 (Week 3): Pt will initiate stair negotiation for strengthening.  PT Short Term Goal 4 (Week 3): Pt will complete bed mobility consistently with mod assist +1.  Skilled Therapeutic Interventions/Progress Updates:    Pt supine in bed upon PT arrival, agreeable to therapy tx and denies pain. Pt transferred to sitting EOB and worked on sitting balance while eating breakfast, set up assist needed to open items. Pt reports that her vision feels like she "just can't focus this morning." Pt continues eating breakfast. Pt then reports that she doesn't feel right, states she also feels lightheaded, BP 150/61. Therapist donned thigh high teds and abdominal binder. Pt performed slideboard transfer to w/c with min assist and transported to gym. Pt states she still does not feel well, requesting to use bathroom. Pt transported back to room and performed sit<>stand within stedy mod assist. Pt transferred w/c<>toilet with stedy, able to maintain standing balance with min assist to perform clothing management. Pt left in care of NT.   Therapy Documentation Precautions:  Precautions Precautions: Fall Precaution Comments: L hemi, L inattention/field cut Restrictions Weight Bearing Restrictions: No   See Function Navigator for Current Functional Status.   Therapy/Group: Individual Therapy  Netta Corrigan, PT, DPT 08/03/2018, 7:49 AM

## 2018-08-04 ENCOUNTER — Inpatient Hospital Stay (HOSPITAL_COMMUNITY): Payer: Medicare Other | Admitting: Physical Therapy

## 2018-08-04 ENCOUNTER — Inpatient Hospital Stay (HOSPITAL_COMMUNITY): Payer: Medicare Other | Admitting: Occupational Therapy

## 2018-08-04 ENCOUNTER — Inpatient Hospital Stay (HOSPITAL_COMMUNITY): Payer: Medicare Other | Admitting: *Deleted

## 2018-08-04 ENCOUNTER — Inpatient Hospital Stay (HOSPITAL_COMMUNITY): Payer: Medicare Other | Admitting: Speech Pathology

## 2018-08-04 NOTE — Progress Notes (Signed)
Subjective/Complaints: Tearful last noc but ok this am, estim to Left forearm yesterday  ROS: Denies CP, SOB, N/V/D   Objective: Vital Signs: Blood pressure (!) 156/73, pulse 68, temperature 98.2 F (36.8 C), temperature source Oral, resp. rate 18, height _0  (1.676 m), weight 89 kg, SpO2 97 %. No results found. Results for orders placed or performed during the hospital encounter of 07/19/18 (from the past 72 hour(s))  Basic metabolic panel     Status: Abnormal   Collection Time: 08/02/18  6:24 AM  Result Value Ref Range   Sodium 140 135 - 145 mmol/L   Potassium 4.4 3.5 - 5.1 mmol/L   Chloride 102 98 - 111 mmol/L   CO2 28 22 - 32 mmol/L   Glucose, Bld 113 (H) 70 - 99 mg/dL   BUN 16 8 - 23 mg/dL   Creatinine, Ser 0.95 0.44 - 1.00 mg/dL   Calcium 10.6 (H) 8.9 - 10.3 mg/dL   GFR calc non Af Amer 59 (L) >60 mL/min   GFR calc Af Amer >60 >60 mL/min    Comment: (NOTE) The eGFR has been calculated using the CKD EPI equation. This calculation has not been validated in all clinical situations. eGFR's persistently <60 mL/min signify possible Chronic Kidney Disease.    Anion gap 10 5 - 15    Comment: Performed at [facility] [address], [place] [address]  CBC     Status: None   Collection Time: 08/02/18  6:24 AM  Result Value Ref Range   WBC 5.8 4.0 - 10.5 K/uL   RBC 4.88 3.87 - 5.11 MIL/uL   Hemoglobin 14.4 12.0 - 15.0 g/dL   HCT 43.9 36.0 - 46.0 %   MCV 90.0 78.0 - 100.0 fL   MCH 29.5 26.0 - 34.0 pg   MCHC 32.8 30.0 - 36.0 g/dL   RDW 12.5 11.5 - 15.5 %   Platelets 253 150 - 400 K/uL    Comment: Performed at [facility], [place] [address], [place], [place] [address]     Constitutional: No distress . Vital signs reviewed. HENT: Normocephalic.  Atraumatic. Eyes: EOMI. No discharge. Cardiovascular: RRR. No JVD. Respiratory: CTA bilaterally. Normal effort. GI: BS +. Non-distended. Musc: No edema or tenderness in extremities. Skin:   Intact. Warm and  dry. Neuro:  Motor: LUE 1-1+/5 proximal distal, now with 2- finger ext Left lower extremity: Hip flexion, knee extension 3 -/5, ankle dorsiflexion 0/5 Increased tone hamstring LUE + grasp and release Psych: Flat  Assessment/Plan: 1. 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 Physiatrist is providing close team supervision and 24 hour management of active medical problems listed below. Physiatrist and rehab team continue to assess barriers to discharge/monitor patient progress toward functional and medical goals. FIM: Function - Bathing Position: Shower Body parts bathed by patient: Left arm, Chest, Abdomen, Front perineal area, Buttocks, Right upper leg, Left upper leg, Right lower leg, Left lower leg Body parts bathed by helper: Right arm, Back Bathing not applicable: Front perineal area, Buttocks Assist Level: Touching or steadying assistance(Pt > 75%)  Function- Upper Body Dressing/Undressing What is the patient wearing?: Pull over shirt/dress, Bra Bra - Perfomed by patient: Thread/unthread right bra strap, Thread/unthread left bra strap Bra - Perfomed by helper: Hook/unhook bra (pull down sports bra) Pull over shirt/dress - Perfomed by patient: Thread/unthread right sleeve, Put head  through opening, Pull shirt over trunk Pull over shirt/dress - Perfomed by helper: Thread/unthread left sleeve Assist Level: (Max assist) Function - Lower Body Dressing/Undressing What is the patient wearing?: Pants, Shoes Position: Wheelchair/chair at sink Pants- Performed by patient: Thread/unthread left pants leg, Pull pants up/down, Thread/unthread right pants leg(pt pulled pants up 80% of the way) Pants- Performed by helper: Thread/unthread right pants leg, Pull pants up/down Non-skid slipper socks- Performed by helper: Don/doff right sock, Don/doff left  sock Shoes - Performed by helper: Don/doff right shoe, Don/doff left shoe TED Hose - Performed by helper: Don/doff right TED hose, Don/doff left TED hose Assist for footwear: Maximal assist Assist for lower body dressing: Touching or steadying assistance (Pt > 75%)  Function - Toileting Toileting steps completed by patient: Performs perineal hygiene Toileting steps completed by helper: Adjust clothing prior to toileting, Performs perineal hygiene, Adjust clothing after toileting Toileting Assistive Devices: Grab bar or rail Assist level: Two helpers  Function - Air cabin crew transfer activity did not occur: Safety/medical concerns Toilet transfer assistive device: Elevated toilet seat/BSC over toilet Mechanical lift: Stedy Assist level to toilet: 2 helpers Assist level from toilet: 2 helpers Assist level to bedside commode (at bedside): 2 helpers Assist level from bedside commode (at bedside): 2 helpers  Function - Chair/bed transfer Chair/bed transfer method: Squat pivot Chair/bed transfer assist level: Moderate assist (Pt 50 - 74%/lift or lower) Chair/bed transfer assistive device: Sliding board, Armrests Mechanical lift: Stedy Chair/bed transfer details: Verbal cues for technique, Verbal cues for sequencing  Function - Locomotion: Wheelchair Will patient use wheelchair at discharge?: Yes Type: Manual Max wheelchair distance: 150 ft  Assist Level: Supervision or verbal cues Assist Level: Supervision or verbal cues Wheel 150 feet activity did not occur: Safety/medical concerns Assist Level: Supervision or verbal cues Turns around,maneuvers to table,bed, and toilet,negotiates 3% grade,maneuvers on rugs and over doorsills: No Function - Locomotion: Ambulation Assistive device: Walker-rolling(RW & L hand orthosis) Max distance: 6 ft  Assist level: 2 helpers(max assist + w/c follow for safety) Assist level: 2 helpers Walk 50 feet with 2 turns activity did not occur:  Safety/medical concerns Walk 150 feet activity did not occur: Safety/medical concerns Walk 10 feet on uneven surfaces activity did not occur: Safety/medical concerns  Function - Comprehension Comprehension: Auditory Comprehension assist level: Understands complex 90% of the time/cues 10% of the time  Function - Expression Expression: Verbal Expression assist level: Expresses complex 90% of the time/cues < 10% of the time  Function - Social Interaction Social Interaction assist level: Interacts appropriately with others with medication or extra time (anti-anxiety, antidepressant).  Function - Problem Solving Problem solving assist level: Solves complex 90% of the time/cues < 10% of the time  Function - Memory Memory assist level: Complete Independence: No helper Patient normally able to recall (first 3 days only): Current season, That he or she is in a hospital, Location of own room, Staff names and faces  Medical Problem List and Plan: 1. Decline in functional status due to resultant left-sided weakness with sensory deficits and left homonymous  hemianopsia secondary to large Right occipitoparietal ICH  Cont CIR PT, OT SLP Improved Left hand grasp/release  WHO/PRAFO ordered .2.  DVT Prophylaxis/Anticoagulation: Mechanical: Sequential compression devices, below knee Bilateral lower extremities 3. Pain Management: Continue Tylenol as needed for headaches 4. Mood: LCSW to follow for evaluation and support, neuropsych to follow , on Zoloft 59m Slept well 5. Neuropsych: This patient is capable of making decisions on her own behalf.  6. Skin/Wound Care: Routine pressure relief measures 7. Fluids/Electrolytes/Nutrition: Monitor I's and O's.  ask dietary to see for dietary, needs set up int sup for meals choices -overall improved  BMP within acceptable range on 9/9 8. HTN: Monitor blood pressure twice daily   Elevated systolic 5/74 goal <[id number] Vitals:   08/04/18 0606 08/04/18 0824  BP:  (!) 145/74 (!) 156/73  Pulse: 78 68  Resp:    Temp:    SpO2: 97%   9.  PAF: Monitor heart rate twice daily-- appears in NSR on Tambocor and metoprolol twice daily. Bradycardia improved metoprolol 12.89m BID and monitor, 10. Constipation: MiraLAX today.  May need to increase senna to twice daily 11.  Urinary retention: Monitor voiding with PVR checks.    Starting to void!Cont urecholine  LOS (Days) 16 A FACE TO FACE EVALUATION WAS PERFORMED  ACharlett Blake9/18/2019, 9:41 AM

## 2018-08-04 NOTE — Patient Care Conference (Signed)
Inpatient RehabilitationTeam Conference and Plan of Care Update Date: 08/04/2018   Time: 10:45 AM    Patient Name: [name]      Medical Record Number: [id number]  Date of Birth: 1946/04/03 Sex: Female         Room/Bed: 4W09C/4W09C-01 Payor Info: Payor: MEDICARE / Plan: MEDICARE PART A AND B / Product Type: *No Product type* /    Admitting Diagnosis: iNTRACRANIAL HEMMORHAGE  Admit Date/Time:  07/19/2018  1:40 PM Admission Comments: No comment available   Primary Diagnosis:  <principal problem not specified> Principal Problem: <principal problem not specified>  Patient Active Problem List   Diagnosis Date Noted  . Spastic hemiparesis ([place])   . Current mild episode of major depressive disorder ([place])   . Intracerebral hemorrhage ([place]) 07/19/2018  . Acute CVA (cerebrovascular accident) ([place])   . Benign essential HTN   . Urinary retention   . Constipation   . ICH (intracerebral hemorrhage) ([place]) 07/13/2018  . Hypertension 07/06/2012  . Paroxysmal atrial fibrillation ([place]) 11/27/2011  . DIARRHEA, ACUTE 05/31/2009  . GERD 03/06/2009  . BACK PAIN 03/06/2009  . ANXIETY 06/12/2007  . DEPRESSION 06/12/2007  . HEADACHE 06/12/2007    Expected Discharge Date: Expected Discharge Date: 08/14/18  Team Members Present: Physician leading conference: Dr. Alysia Penna Social Worker Present: Ovidio Kin, LCSW Nurse Present: Rayetta Pigg, RN PT Present: Leavy Cella, PT OT Present: [name], OT SLP Present: Stormy Fabian, SLP PPS Coordinator present : Daiva Nakayama, RN, CRRN     Current Status/Progress Goal Weekly Team Focus  Medical   now at Mod A, episodes of tearfulness  Now voiding  bladder cont  Cont PT, OT< SLP, had Botox for hamstring spasticity   Bowel/Bladder   Continent of bowel and bladder; LBM 08/03/18  Remain continent  Assess bowel and bladder needs q shift and PRN; continue to scan bladder q 4-6h and post void   Swallow/Nutrition/ Hydration              ADL's   max assist bathing and dressing, +2 for safety, squat pivot transfers max assist, Pt is developing Lt finger flexion/extension and wrist extension  min assist, downgraded transfers to mod assist  ADL retraining, dynamic stting and standing balance, transfers, sit > stand, LUE NMR   Mobility   supervision w/c mobility with cuing for L attention, min assist slide board transfers but progressed to gait with RW on Monday x 6 ft with max assist with improved ability to advance LLE  supervision w/c mobility, min<>mod assist transfers, mod assist stair negotiation for home entry  L attention, gait, transfers, w/c mobility, L NMR, endurance, strengthening, w/c mobility   Communication             Safety/Cognition/ Behavioral Observations  Supervision for semi-complex problem solving, Min a scanning to left of midline in functional context  Mod I for semi-complex - downgraded 9/18  semi-complex problem solving, left inattention   Pain   C/o headache, treated with Tylenol; c/o left leg pain, treated with K pad  Pain </= 3/10  Assess pain q shift and PRN and medicate as needed   Skin   No skin issues  Remain free of infection and breakdown  Assess skin q shift and PRN      *See Care Plan and progress notes for long and short-term goals.     Barriers to Discharge  Current Status/Progress Possible Resolutions Date Resolved   Physician    Decreased caregiver support;Medical stability  increased LUE movement  Cont rehab, Neuropsych eval      Nursing                  PT  Inaccessible home environment;Home environment access/layout;Lack of/limited family support  multiple steps to enter house & may need ramp, unsure if family can provide 24 hr supervision              OT                  SLP                SW                Discharge Planning/Teaching Needs:  Family discussing after care, at her current level she will need to pursue short term NHP and then hopefully home.       Team Discussion:  Progressing well in therapies last week and this week. After botox injection in leg. More aware of her lefft inattention and distraction. Voiding on own now and continent. Headaches and anxiety concern her and MD is addressing both. E-stim started on her arm. Neuro-psych seeing for coping. Discharge plan dependent on level she is at DC. May be able to upgrade goals if progress continues.  Revisions to Treatment Plan:  DC 9/28    Continued Need for Acute Rehabilitation Level of Care: The patient requires daily medical management by a physician with specialized training in physical medicine and rehabilitation for the following conditions: Daily direction of a multidisciplinary physical rehabilitation program to ensure safe treatment while eliciting the highest outcome that is of practical value to the patient.: Yes Daily medical management of patient stability for increased activity during participation in an intensive rehabilitation regime.: Yes Daily analysis of laboratory values and/or radiology reports with any subsequent need for medication adjustment of medical intervention for : Neurological problems;Blood pressure problems   I attest that I was present, lead the team conference, and concur with the assessment and plan of the team.   Elease Hashimoto 08/05/2018, 1:37 PM

## 2018-08-04 NOTE — Progress Notes (Signed)
Speech Language Pathology Daily Session Note  Patient Details  Name: [name] MRN: [id number] Date of Birth: August 25, 1946  Today's Date: 08/04/2018 SLP Individual Time: 0730-0830 SLP Individual Time Calculation (min): 60 min  Short Term Goals: Week 3: SLP Short Term Goal 1 (Week 3): Patient will demonstrate functional problem solving for mildly complex tasks with supervision verbal cues. SLP Short Term Goal 2 (Week 3): Patient will self-monitor and correct errors during functional tasks with supervision verbal cues.  SLP Short Term Goal 3 (Week 3): Patient will attend to/locate items in left field of enviornment during functional tasks with Mod I A verbal cues.  SLP Short Term Goal 4 (Week 3): Patient will demonstrate anticipatory awareness by listing 3 safe and 3 unsafe activties upon discharge with supervision A question and verbal cues  Skilled Therapeutic Interventions: Skilled treatment session focused on cognition goals. SLP facilitated session by providing the Right Hemisphere Inventory. Pt received functional scores on all sub-tests with the exception of the clock drawing. Pt didn't write any numbers to left of midline. Pt with increased self-advocacy and requested SLP shut her door and turned off TV to decrease distractions. Pt has made great progress within structured tasks however when distractions increase (sucha therapy gym) but with decreased functional ability and decreased left attention. Education provided and pt with good insight. Pt left upright in bed, bed alarm on and all needs within reach. Continue per current plan of care.      Function:    Cognition Comprehension Comprehension assist level: Understands complex 90% of the time/cues 10% of the time  Expression   Expression assist level: Expresses complex 90% of the time/cues < 10% of the time  Social Interaction Social Interaction assist level: Interacts appropriately with others with medication or extra time  (anti-anxiety, antidepressant).  Problem Solving Problem solving assist level: Solves complex 90% of the time/cues < 10% of the time  Memory Memory assist level: Complete Independence: No helper    Pain Pain Assessment Pain Scale: 0-10 Pain Score: 6  Pain Type: Acute pain Pain Location: Head Pain Orientation: Upper Pain Descriptors / Indicators: Headache Pain Frequency: Intermittent Patients Stated Pain Goal: 0 Pain Intervention(s): RN made aware  Therapy/Group: Individual Therapy  Windel Keziah 08/04/2018, 9:16 AM

## 2018-08-04 NOTE — Progress Notes (Signed)
Occupational Therapy Weekly Progress Note  Patient Details  Name: [name] MRN: [id number] Date of Birth: Jun 03, 1946  Beginning of progress report period: July 27, 2018 End of progress report period: August 04, 2018  Today's Date: 08/04/2018 OT Individual Time: 7124-5809 OT Individual Time Calculation (min): 57 min    Patient has met 4 of 4 short term goals.  Pt is making steady progress towards goals.  Pt has demonstrated improved ability to complete transfers either via squat pivot, stand pivot, and slide board with pt as little as min assist with slide board and mod-max assist squat and stand pivot transfers.  Pt requires min-mod assist sit > stand and min assist for dynamic standing balance while attempting to complete self-care tasks.  Pt continues to require assistance with LB dressing completing in figure 4 position and requiring assistance to pull pants over hips.  Pt has begun to have return in Lt wrist extension and finger flexion and extension, therefore increased focus on functional use during self-care tasks.    Patient continues to demonstrate the following deficits: muscle weakness, impaired timing and sequencing, abnormal tone and unbalanced muscle activation, decreased visual perceptual skills and hemianopsia, decreased attention to left, decreased awareness, decreased problem solving, decreased safety awareness and decreased memory and decreased sitting balance, decreased standing balance, decreased postural control, hemiplegia and decreased balance strategies and therefore will continue to benefit from skilled OT intervention to enhance overall performance with BADL and Reduce care partner burden.  Patient progressing toward long term goals..  Continue plan of care.  OT Short Term Goals Week 2:  OT Short Term Goal 1 (Week 2): Pt will complete toilet transfers with max assist of 1 caregiver OT Short Term Goal 1 - Progress (Week 2): Met OT Short Term Goal 2  (Week 2): Pt will don pants with max assist of 1 caregiver OT Short Term Goal 2 - Progress (Week 2): Met OT Short Term Goal 3 (Week 2): Pt will complete bathing with min assist OT Short Term Goal 3 - Progress (Week 2): Met OT Short Term Goal 4 (Week 2): Pt will scan to Lt to obtain items during self-care tasks with min cues OT Short Term Goal 4 - Progress (Week 2): Met Week 3:  OT Short Term Goal 1 (Week 3): Pt will complete LB dressing with min assist at sit > stand level OT Short Term Goal 2 (Week 3): Pt will complete toilet transfers with min assist OT Short Term Goal 3 (Week 3): Pt will complete shower transfers with mod assist OT Short Term Goal 4 (Week 3): Pt will utilize LUE as gross assist with min cues  Skilled Therapeutic Interventions/Progress Updates:    Treatment session with focus on ADL retraining with functional transfers, sit > stand, and dynamic standing balance.  Pt received on toilet in [place].  Completed toileting with pt able to stand in [place] with min assist and complete perineal hygiene.  Engaged in bathing and dressing at sit > stand level at sink with focus on increased independence.  Pt able to come to standing with overall mod assist and participated in washing buttocks and perineal area while therapist provided min-mod assist to maintain standing balance.  Utilized figure 4 position when donning underwear and pants with pt continuing to require max cues and assistance.  In standing, pt able to pull pants over Rt hip while therapist assisted with standing balance, but unable to weight shift enough to reach around backside to pull pants over  Rt hip.  Engaged in transfer training at toilet with focus on safest transfer technique.  Completed stand step transfer and squat pivot transfer with pt able to complete stand step with max assist and squat pivot mod assist.  Pt will benefit from continued practice with squat pivot transfers to increase safety with transfers.  Pt transferred  to recliner via squat pivot with max assist.  Pt reports pleased with progress this week.  Therapy Documentation Precautions:  Precautions Precautions: Fall Precaution Comments: L hemi, L inattention/field cut Restrictions Weight Bearing Restrictions: No General:   Vital Signs: Therapy Vitals Temp: 98.2 F (36.8 C) Temp Source: Oral Pulse Rate: 78 Resp: 18 BP: (!) 145/74 Patient Position (if appropriate): Standing Oxygen Therapy SpO2: 97 % O2 Device: Room Air Pain:  Pt with no c/o pain  See Function Navigator for Current Functional Status.   Therapy/Group: Individual Therapy  [name] 08/04/2018, 7:57 AM

## 2018-08-04 NOTE — Progress Notes (Signed)
Recreational Therapy Session Note  Patient Details  Name: [name] MRN: [id number] Date of Birth: [birth year] Today's Date: 08/04/2018 Time:  1005-1030 Pain: no c/o  Goals include: >Pt will be able to identify stress with Mod I. >Pt will state >3 signs/symptoms of stress with Mod I. >Pt will identify at least 2 stress management strategies.  Session focused on identifying what stress is, her personal symptoms of stress, what triggers her stress and strategies she uses to assist with stress management.  Pt reports she uses diversion and leisure participation to assist with clearing her mind so that she can process through her stressors and identify better strategies for managing them.  Pt met all goals. Malacai Grantz 08/04/2018, 3:02 PM

## 2018-08-04 NOTE — Progress Notes (Signed)
Physical Therapy Session Note  Patient Details  Name: [name] MRN: [id number] Date of Birth: 06/11/1946  Today's Date: 08/04/2018 PT Individual Time: 0900-1000 and 1515-1545 PT Individual Time Calculation (min): 60 min and 30 min  Short Term Goals: Week 3:  PT Short Term Goal 1 (Week 3): Pt will ambulate 30 ft with LRAD & max assist +1. PT Short Term Goal 2 (Week 3): Pt will complete stand pivot transfer bed<>w/c with LRAD. PT Short Term Goal 3 (Week 3): Pt will initiate stair negotiation for strengthening.  PT Short Term Goal 4 (Week 3): Pt will complete bed mobility consistently with mod assist +1.  Skilled Therapeutic Interventions/Progress Updates: Tx1: Pt presented in bed agreeable to therapy. Per pt has HA but is resolving as received pain meds. PTA donned TED hose and shoes total A. Performed supine to sit with use of bed features modA with pt noted to have LOB posteriorly requiring minA for recovery. Performed SB transfer minA with min tactile cues for head hips relationship. Pt transported to rehab gym hallway and participated in gait training. Pt ambulated 71f x 2 with wall rail with mod fading to maxA with fatigue. Pt was able to initiate LLE with PTA blocking L knee and manual cues for increasing L wt shifting. Participated in sit to/from stands with RW and wt shifting with RW with mirror feedback. Pt required mod multimodal cues for full knee extension of LLE and tactile cues for wt shifting to L. Pt also performed step forwards/backwards for emphasis on wt shifting. Pt transported to day room and participated in Cybex Kinetron 90cm/sec in sitting to reciprocal activitiy and forced use LLE. Pt was able to complete 3 cycles 10. Pt returned to room and remained in w/c at end fo session with chair alarm on, call bell within reach, and current needs met.   Tx2: Pt presented in recliner agreeable to therapy. Participated in blocked practice squat pivot transfers from recliner to  bed. Pt required maxA L to bed and modA R to recliner. Practiced scooting L/R in bed with tactile cues for increasing lateral lean and use of RLE. Performed sit to supine to R with mod/maxA to flat and assistance for LLE management. Participated in LDraperfor NMR. Performed AA heel slides, AA SLR, AA hip abd, AA SAQ, AA hip add x 10 ea. Pt positioned for comfort and left with call bell within reach, bed alarm on, and needs met.      Therapy Documentation Precautions:  Precautions Precautions: Fall Precaution Comments: L hemi, L inattention/field cut Restrictions Weight Bearing Restrictions: (P) No General:   Vital Signs:  Pain: Pain Assessment Pain Scale: 0-10 Pain Score: 0-No pain Pain Type: Acute pain Pain Location: Head Pain Intervention(s): RN made aware   See Function Navigator for Current Functional Status.   Therapy/Group: Individual Therapy  Reynolds Kittel  Kain Milosevic, PTA  08/04/2018, 12:36 PM

## 2018-08-05 ENCOUNTER — Inpatient Hospital Stay (HOSPITAL_COMMUNITY): Payer: Medicare Other

## 2018-08-05 ENCOUNTER — Inpatient Hospital Stay (HOSPITAL_COMMUNITY): Payer: Medicare Other | Admitting: Occupational Therapy

## 2018-08-05 ENCOUNTER — Inpatient Hospital Stay (HOSPITAL_COMMUNITY): Payer: Medicare Other | Admitting: Speech Pathology

## 2018-08-05 ENCOUNTER — Inpatient Hospital Stay (HOSPITAL_COMMUNITY): Payer: Medicare Other | Admitting: Physical Therapy

## 2018-08-05 NOTE — Progress Notes (Signed)
Social Work Patient ID: [name], female   DOB: 09-28-1946, 72 y.o.   MRN: [id number] Spoke with Sherry-daughter to set up time tomorrow to go over [facility] and the questions she has. Pt is aware and will discuss team conference and the progress she has made this week. Pt is pleased with how well she is doing. See daughter tomorrow at 3:15.

## 2018-08-05 NOTE — Progress Notes (Signed)
Occupational Therapy Session Note  Patient Details  Name: [name] MRN: [id number] Date of Birth: [birth year]  Today's Date: 08/05/2018 OT Individual Time: 1405-1430 OT Individual Time Calculation (min): 25 min    Short Term Goals: Week 3:  OT Short Term Goal 1 (Week 3): Pt will complete LB dressing with min assist at sit > stand level OT Short Term Goal 2 (Week 3): Pt will complete toilet transfers with min assist OT Short Term Goal 3 (Week 3): Pt will complete shower transfers with mod assist OT Short Term Goal 4 (Week 3): Pt will utilize LUE as gross assist with min cues  Skilled Therapeutic Interventions/Progress Updates:    Treatment session with focus on LUE NMR in supine.  Pt received in bed with SLP present.  Pt and SLP spoke with therapist about fall that occurred this AM and discussed fall risk and recommendation for pt to sit in recliner instead of w/c when sitting up for long periods of time.  Engaged in [place] in supine with focus on bicep and tricep activation.  Pt able to complete elbow flexion in supine with min tactile cues to to facilitate proper movement.  Elbow extension more challenging for pt, therapist providing tactile and verbal cues to "relax" and encouraged pt to visually attend to arm during both flexion and extension.  Pt pleased with progress as pt now with finger, wrist, and elbow movements.  Reapplied ice to Lt shoulder at end of session.  Therapy Documentation Precautions:  Precautions Precautions: Fall Precaution Comments: L hemi, L inattention/field cut Restrictions Weight Bearing Restrictions: No General:   Vital Signs: Therapy Vitals Temp: 98.1 F (36.7 C) Pulse Rate: 61 Resp: 16 BP: (!) 151/70 Oxygen Therapy SpO2: 100 % O2 Device: Room Air Pain:  Pt with c/o pain in Lt shoulder.  Premedicated.  Ice applied    See Function Navigator for Current Functional Status.   Therapy/Group: Individual Therapy  [name] 08/05/2018,  4:13 PM

## 2018-08-05 NOTE — Progress Notes (Signed)
Occupational Therapy Session Note  Patient Details  Name: [name] MRN: [id number] Date of Birth: 26-Dec-1945  Today's Date: 08/05/2018 OT Individual Time: 3382-5053 OT Individual Time Calculation (min): 62 min    Short Term Goals: Week 3:  OT Short Term Goal 1 (Week 3): Pt will complete LB dressing with min assist at sit > stand level OT Short Term Goal 2 (Week 3): Pt will complete toilet transfers with min assist OT Short Term Goal 3 (Week 3): Pt will complete shower transfers with mod assist OT Short Term Goal 4 (Week 3): Pt will utilize LUE as gross assist with min cues  Skilled Therapeutic Interventions/Progress Updates:    Treatment session with focus on ADL retraining with bathing and dressing at sit > stand level.  Pt received supine in bed reporting pain in Lt thigh, premedicated, but wanting to engage in bathing at shower level.  Completed bed mobility mod assist sit > stand in [place] with mod assist.  Transferred to room shower via Stedy.  Engaged in bathing seated with use of figure 4 position to wash lower legs, pt reports tightness in Lt thigh especially when in figure 4 position.  Sit > stand mod assist with pt utilizing grab bar on Lt for UE support, therapist washed buttocks while pt maintained standing with UE support and therapist providing support at Lt knee.  Completed dressing at sit > stand level at sink with pt requiring +2 assistance for sit > stand this session and pt requiring increased assistance to pull pants over hips due to pain in LLE.  Pt left upright in w/c with all needs in reach.  Therapy Documentation Precautions:  Precautions Precautions: Fall Precaution Comments: L hemi, L inattention/field cut Restrictions Weight Bearing Restrictions: No General:   Vital Signs: Therapy Vitals Temp: 98.2 F (36.8 C) Pulse Rate: 78 Resp: 18 BP: (!) 149/89 Patient Position (if appropriate): Lying Oxygen Therapy SpO2: 99 % O2 Device: Room  Air Pain: Pt with c/o pain in Lt thigh.  Premedicated.  Monitored throughout session.  See Function Navigator for Current Functional Status.   Therapy/Group: Individual Therapy  [name] 08/05/2018, 9:21 AM

## 2018-08-05 NOTE — Progress Notes (Signed)
Social Work  Nalina Yeatman, Eliezer Champagne  Social Worker  Physical Medicine and Rehabilitation  Patient Care Conference  Cosign Needed  Date of Service:  08/04/2018  1:39 PM          Cosign Needed        Show:Clear all [x] Manual[x] Template[] Copied  Added by: [x] Dameer Speiser, Gardiner Rhyme, LCSW  [] Hover for details Inpatient RehabilitationTeam Conference and Plan of Care Update Date: 08/04/2018   Time: 10:45 AM      Patient Name: [name]      Medical Record Number: [id number]  Date of Birth: 08-24-1946 Sex: Female         Room/Bed: 4W09C/4W09C-01 Payor Info: Payor: MEDICARE / Plan: MEDICARE PART A AND B / Product Type: *No Product type* /     Admitting Diagnosis: iNTRACRANIAL HEMMORHAGE  Admit Date/Time:  07/19/2018  1:40 PM Admission Comments: No comment available    Primary Diagnosis:  <principal problem not specified> Principal Problem: <principal problem not specified>       Patient Active Problem List    Diagnosis Date Noted  . Spastic hemiparesis ([place])    . Current mild episode of major depressive disorder ([place])    . Intracerebral hemorrhage ([place]) 07/19/2018  . Acute CVA (cerebrovascular accident) ([place])    . Benign essential HTN    . Urinary retention    . Constipation    . ICH (intracerebral hemorrhage) ([place]) 07/13/2018  . Hypertension 07/06/2012  . Paroxysmal atrial fibrillation ([place]) 11/27/2011  . DIARRHEA, ACUTE 05/31/2009  . GERD 03/06/2009  . BACK PAIN 03/06/2009  . ANXIETY 06/12/2007  . DEPRESSION 06/12/2007  . HEADACHE 06/12/2007      Expected Discharge Date: Expected Discharge Date: 08/14/18   Team Members Present: Physician leading conference: Dr. Alysia Penna Social Worker Present: Ovidio Kin, LCSW Nurse Present: Rayetta Pigg, RN PT Present: Leavy Cella, PT OT Present: [name], OT SLP Present: Stormy Fabian, SLP PPS Coordinator present : Daiva Nakayama, RN, CRRN       Current Status/Progress Goal Weekly Team Focus  Medical    now at Mod A, episodes of tearfulness  Now voiding  bladder cont  Cont PT, OT< SLP, had Botox for hamstring spasticity   Bowel/Bladder     Continent of bowel and bladder; LBM 08/03/18  Remain continent  Assess bowel and bladder needs q shift and PRN; continue to scan bladder q 4-6h and post void   Swallow/Nutrition/ Hydration               ADL's     max assist bathing and dressing, +2 for safety, squat pivot transfers max assist, Pt is developing Lt finger flexion/extension and wrist extension  min assist, downgraded transfers to mod assist  ADL retraining, dynamic stting and standing balance, transfers, sit > stand, LUE NMR   Mobility     supervision w/c mobility with cuing for L attention, min assist slide board transfers but progressed to gait with RW on Monday x 6 ft with max assist with improved ability to advance LLE  supervision w/c mobility, min<>mod assist transfers, mod assist stair negotiation for home entry  L attention, gait, transfers, w/c mobility, L NMR, endurance, strengthening, w/c mobility   Communication               Safety/Cognition/ Behavioral Observations   Supervision for semi-complex problem solving, Min a scanning to left of midline in functional context  Mod I for semi-complex - downgraded 9/18  semi-complex problem solving, left inattention  Pain     C/o headache, treated with Tylenol; c/o left leg pain, treated with K pad  Pain </= 3/10  Assess pain q shift and PRN and medicate as needed   Skin     No skin issues  Remain free of infection and breakdown  Assess skin q shift and PRN     *See Care Plan and progress notes for long and short-term goals.      Barriers to Discharge   Current Status/Progress Possible Resolutions Date Resolved   Physician     Decreased caregiver support;Medical stability     increased LUE movement  Cont rehab, Neuropsych eval      Nursing                 PT  Inaccessible home environment;Home environment access/layout;Lack  of/limited family support  multiple steps to enter house & may need ramp, unsure if family can provide 24 hr supervision              OT                 SLP            SW              Discharge Planning/Teaching Needs:  Family discussing after care, at her current level she will need to pursue short term NHP and then hopefully home.      Team Discussion:  Progressing well in therapies last week and this week. After botox injection in leg. More aware of her lefft inattention and distraction. Voiding on own now and continent. Headaches and anxiety concern her and MD is addressing both. E-stim started on her arm. Neuro-psych seeing for coping. Discharge plan dependent on level she is at DC. May be able to upgrade goals if progress continues.  Revisions to Treatment Plan:  DC 9/28    Continued Need for Acute Rehabilitation Level of Care: The patient requires daily medical management by a physician with specialized training in physical medicine and rehabilitation for the following conditions: Daily direction of a multidisciplinary physical rehabilitation program to ensure safe treatment while eliciting the highest outcome that is of practical value to the patient.: Yes Daily medical management of patient stability for increased activity during participation in an intensive rehabilitation regime.: Yes Daily analysis of laboratory values and/or radiology reports with any subsequent need for medication adjustment of medical intervention for : Neurological problems;Blood pressure problems     I attest that I was present, lead the team conference, and concur with the assessment and plan of the team.     Elease Hashimoto 08/05/2018, 1:37 PM                Adom Schoeneck, Gardiner Rhyme, LCSW  Social Worker  Physical Medicine and Rehabilitation  Patient Care Conference  Cosign Needed  Date of Service:  07/28/2018  1:50 PM          Cosign Needed        Show:Clear  all [x] Manual[x] Template[] Copied  Added by: [x] Lunetta Marina, Gardiner Rhyme, LCSW  [] Hover for details Inpatient RehabilitationTeam Conference and Plan of Care Update Date: 07/28/2018   Time: 10:30 AM      Patient Name: [name]      Medical Record Number: [id number]  Date of Birth: 09-23-1946 Sex: Female         Room/Bed: 4W09C/4W09C-01 Payor Info: Payor: MEDICARE / Plan: MEDICARE PART A AND B / Product Type: *No Product type* /  Admitting Diagnosis: iNTRACRANIAL HEMMORHAGE  Admit Date/Time:  07/19/2018  1:40 PM Admission Comments: No comment available    Primary Diagnosis:  <principal problem not specified> Principal Problem: <principal problem not specified>       Patient Active Problem List    Diagnosis Date Noted  . Current mild episode of major depressive disorder ([place])    . Intracerebral hemorrhage ([place]) 07/19/2018  . Acute CVA (cerebrovascular accident) ([place])    . Benign essential HTN    . Urinary retention    . Constipation    . ICH (intracerebral hemorrhage) ([place]) 07/13/2018  . Hypertension 07/06/2012  . Paroxysmal atrial fibrillation ([place]) 11/27/2011  . DIARRHEA, ACUTE 05/31/2009  . GERD 03/06/2009  . BACK PAIN 03/06/2009  . ANXIETY 06/12/2007  . DEPRESSION 06/12/2007  . HEADACHE 06/12/2007      Expected Discharge Date: Expected Discharge Date: 08/14/18   Team Members Present: Physician leading conference: Dr. Alysia Penna Social Worker Present: Ovidio Kin, LCSW Nurse Present: Benjie Karvonen, RN PT Present: Leavy Cella, PT OT Present: [name], OT SLP Present: Charolett Bumpers, SLP PPS Coordinator present : Ileana Ladd, PT       Current Status/Progress Goal Weekly Team Focus  Medical     heavy assist level, urinary retention  bladder emptying vs ICP teaching for caregiver  cont PT< OT, asess home envirnment barriers   Bowel/Bladder     Continent of bowel; I&O cath q4-6H; LBM 07/27/18  Remain continent of bowel, regain bladder function  Assess  bowel and bladder needs q shift and PRN   Swallow/Nutrition/ Hydration               ADL's     +2 for ADLs, Lt homonymous hemianopsia, Lt lean in dynamic sitting, flaccid LUE.  Max assist LB dressing at bed level  min assist, downgraded transfers to mod assist  ADL retraining, dynamic sitting balance, transfers, sit > stand, LUE NMR   Mobility     +2 assist for transfers, max assist + w/c follow for safety for very short distance gait with rail, min<>mod assist w/c mobility, L inattention  supervision w/c mobility, min<>mod assist transfers, mod assist stair negotiation for home entry  midline orientation, sitting/standing balance, L NMR, transfers, L attention, gait, endurance, strengthening, w/c mobility   Communication               Safety/Cognition/ Behavioral Observations   Min- supervision A problem solving, Mod-min A scaning midline/left field  Mod I for complex  left inattention, complex problen solving and error awareness   Pain     C/o headache; took Tylenol twice during shift  pain </=3/10  assess pain q shift and PRN and medicate as needed   Skin     bilat bruising to arms, abdomen, and coccyx; being treated with barrier cream  prevent infection and further breakdown  assess skin q shift and PRN     *See Care Plan and progress notes for long and short-term goals.      Barriers to Discharge   Current Status/Progress Possible Resolutions Date Resolved   Physician     Inaccessible home environment;Decreased caregiver support;Medical stability     slow progress with mobility, left neglect  Cont to focus on Left sided awareness      Nursing                 PT  Inaccessible home environment;Home environment access/layout;Lack of/limited family support  pt has multiple steps to enter house &  may need ramp, unsure if family can provide 24 hr supervision              OT                 SLP            SW              Discharge Planning/Teaching Needs:  Family here daily and  very committed to pt and taking her home. Aware she will need 24 hr physical care.      Team Discussion:  Progressing slowly toward her goals. Nursing having to I & O caths not voiding. L-inattention and attention deficits hinder her. May need to downgrade goals to mod assist level due to slow progress. Continent of bowel. Still using stedy for transfers. Pt will be much care at discharge. Have family come in for observation in therapies.  Revisions to Treatment Plan:  Ques downgrade goals to mod assist level. DC 9/28    Continued Need for Acute Rehabilitation Level of Care: The patient requires daily medical management by a physician with specialized training in physical medicine and rehabilitation for the following conditions: Daily direction of a multidisciplinary physical rehabilitation program to ensure safe treatment while eliciting the highest outcome that is of practical value to the patient.: Yes Daily medical management of patient stability for increased activity during participation in an intensive rehabilitation regime.: Yes Daily analysis of laboratory values and/or radiology reports with any subsequent need for medication adjustment of medical intervention for : Neurological problems;Blood pressure problems     I attest that I was present, lead the team conference, and concur with the assessment and plan of the team.     Elease Hashimoto 07/28/2018, 1:50 PM                Andrius Andrepont, Gardiner Rhyme, LCSW  Social Worker  Physical Medicine and Rehabilitation  Patient Care Conference  Cosign Needed  Date of Service:  07/21/2018  1:15 PM          Cosign Needed        Show:Clear all [x] Manual[x] Template[] Copied  Added by: [x] Stormy Connon, Gardiner Rhyme, LCSW  [] Hover for details Inpatient RehabilitationTeam Conference and Plan of Care Update Date: 07/21/2018   Time: 10:30 AM      Patient Name: [place] Record Number: [id number]  Date of Birth:  March 12, 1946 Sex: Female         Room/Bed: 4W09C/4W09C-01 Payor Info: Payor: MEDICARE / Plan: MEDICARE PART A AND B / Product Type: *No Product type* /     Admitting Diagnosis: iNTRACRANIAL HEMMORHAGE  Admit Date/Time:  07/19/2018  1:40 PM Admission Comments: No comment available    Primary Diagnosis:  <principal problem not specified> Principal Problem: <principal problem not specified>       Patient Active Problem List    Diagnosis Date Noted  . Intracerebral hemorrhage ([place]) 07/19/2018  . Acute CVA (cerebrovascular accident) ([place])    . Benign essential HTN    . Urinary retention    . Constipation    . ICH (intracerebral hemorrhage) ([place]) 07/13/2018  . Hypertension 07/06/2012  . Paroxysmal atrial fibrillation ([place]) 11/27/2011  . DIARRHEA, ACUTE 05/31/2009  . GERD 03/06/2009  . BACK PAIN 03/06/2009  . ANXIETY 06/12/2007  . DEPRESSION 06/12/2007  . HEADACHE 06/12/2007      Expected Discharge Date: Expected Discharge Date: 08/14/18   Team Members Present: Physician leading conference: Dr.  Alysia Penna Social Worker Present: Ovidio Kin, LCSW Nurse Present: Dorien Chihuahua, RN PT Present: Lavone Nian, PT OT Present: [name], OT SLP Present: Stormy Fabian, SLP PPS Coordinator present : Daiva Nakayama, RN, CRRN       Current Status/Progress Goal Weekly Team Focus  Medical     Left neglect, Left hemiplegia  avoid falls, improve bladder emptying  Treat UTI   Bowel/Bladder     continenet of bowel, in/out cath Q4-6hrs, LBM 9/2  regain bladder function  continue in/out caths   Swallow/Nutrition/ Hydration               ADL's     +2 for all ADLs.  Lt homonymous hemianopsia, Lt lean in dynamic sitting, flaccid LUE  Min assist   ADL retraining, dynamic sitting balance, transfers, sit > stand, LUE NMR   Mobility     +2 assist overall for transfers, single step negotiation, and ambulation x 15 ft, mod assist w/c mobility, L inattention  supervision w/c mobility, min<>mod  assist transfers, mod assist stair negotiation for home entry  midline orientation, sitting/standing balance, L NMR, L attention, transfers, gait, endurance, strengthening, w/c mobility   Communication               Safety/Cognition/ Behavioral Observations             Pain     c/o constant headache, has tylenol q 4 prn, used twice last night  pain scale <3/10  continue to assess & treat as needed   Skin     bruising to abdomen & arms, fissure to coccyx area, using barrier cream  no new areas of skin breakdown or worsening of fissure  assess q shift     *See Care Plan and progress notes for long and short-term goals.      Barriers to Discharge   Current Status/Progress Possible Resolutions Date Resolved   Physician     Medical stability;Neurogenic Bowel & Bladder     progressing with therapy  cont rehab , I/O,       Nursing   Neurogenic Bowel & Bladder             PT  Inaccessible home environment;Home environment access/layout;Lack of/limited family support  pt has multiple steps to enter house & may need ramp, unsure if family can provide 24 hr supervision that is recommended              OT Home environment access/layout               SLP            SW              Discharge Planning/Teaching Needs:  Family plans to take pt home with 24 hr care, trying to arrange this between family members. Son lives behind pt       Team Discussion:  Goals min assist level , currently plus 2 with stedy for transfers. L-shoulder pain with movement MD addressing. Working on attention and awareness. L-neglect from CVA. Will need 24 hr physical care at discharge. MD addressing BP issues.Dense hemiparesis. Continent of bowel and had to I & O cath this am due to retention. Neuro-psych to see for coping.  Revisions to Treatment Plan:  DC 9/28    Continued Need for Acute Rehabilitation Level of Care: The patient requires daily medical management by a physician with specialized training in physical  medicine and rehabilitation for the following conditions: Daily  direction of a multidisciplinary physical rehabilitation program to ensure safe treatment while eliciting the highest outcome that is of practical value to the patient.: Yes Daily medical management of patient stability for increased activity during participation in an intensive rehabilitation regime.: Yes Daily analysis of laboratory values and/or radiology reports with any subsequent need for medication adjustment of medical intervention for : Neurological problems;Blood pressure problems     I attest that I was present, lead the team conference, and concur with the assessment and plan of the team. Dr. Alysia Penna   Elease Hashimoto 07/21/2018, 1:15 PM               Patient ID: [name], female   DOB: 1946/06/13, 72 y.o.   MRN: [id number]

## 2018-08-05 NOTE — Progress Notes (Signed)
Speech Language Pathology Daily Session Note  Patient Details  Name: [name] MRN: [id number] Date of Birth: [birth year]  Today's Date: 08/05/2018 SLP Individual Time: 0930-1030 SLP Individual Time Calculation (min): 60 min  Short Term Goals: Week 3: SLP Short Term Goal 1 (Week 3): Patient will demonstrate functional problem solving for mildly complex tasks with supervision verbal cues. SLP Short Term Goal 2 (Week 3): Patient will self-monitor and correct errors during functional tasks with supervision verbal cues.  SLP Short Term Goal 3 (Week 3): Patient will attend to/locate items in left field of enviornment during functional tasks with Mod I A verbal cues.  SLP Short Term Goal 4 (Week 3): Patient will demonstrate anticipatory awareness by listing 3 safe and 3 unsafe activties upon discharge with supervision A question and verbal cues  Skilled Therapeutic Interventions: Skilled treatment session focused on cognition goals. SLP facilitated session by providing moderately distracting environment to perform semi-complex porblem solving tasks with good selective attention for ~ 45 minutes with Mod I. Pt with occasional frustration with task but educaiton provided that pt needed to work through frustration to gain independence. Pt receptive of support and was able to persue problem solving task to completion. Pt required Mod A cues to scan to left but once given compensatory strategy (find SLP's arm) pt able to implement strategy with each task. Pt was returned to room, left upright in wheelchair with all needs within reach. Continue per current plan of care.      Function:    Cognition Comprehension Comprehension assist level: Understands complex 90% of the time/cues 10% of the time  Expression   Expression assist level: Expresses complex 90% of the time/cues < 10% of the time  Social Interaction Social Interaction assist level: Interacts appropriately with others with medication or  extra time (anti-anxiety, antidepressant).  Problem Solving Problem solving assist level: Solves complex 90% of the time/cues < 10% of the time  Memory Memory assist level: Complete Independence: No helper    Pain    Therapy/Group: Individual Therapy  [name] 08/05/2018, 10:41 AM

## 2018-08-05 NOTE — Progress Notes (Signed)
Patient found on the floor by staff. She she reports that she was repositioning herself in the chair slid and tipped out onto the floor.  She put out her right wrist to break her fall and is reporting some discomfort right medial wrist area.  She did have left arm twisted behind her and has some pain left shoulder.  Also repeat the pain right thigh--acute on chronic?  No headaches dizziness or lightheadedness.   Exam: --pupils equal round reactive to light.   --Speech clear, cognition intact left spastic hemiparesis without change.   --Left medial thigh tenderness to palpation appears musculoskeletal in nature.  No pain with ROM of left hip, left knee, right hip, right knee,  left elbow or left wrist. Right medial wrist with minimal tenderness. Left shoulder ranged without pain but left trapezius pain to palpation--likely MS in nature.   A/P 1. Will  Xray left shoulder, left femur, right wrist and CT head. 2. Apply ice to left shoulder, femur and wrist qid X 2 days.  3. Monitor for now. Follow protocol for unwitnessed fall.

## 2018-08-05 NOTE — Progress Notes (Signed)
Physical Therapy Session Note  Patient Details  Name: [name] MRN: [id number] Date of Birth: 09-07-1946  Today's Date: 08/05/2018 PT Individual Time: 1100-1200 PT Individual Time Calculation (min): 60 min   Short Term Goals: Week 3:  PT Short Term Goal 1 (Week 3): Pt will ambulate 30 ft with LRAD & max assist +1. PT Short Term Goal 2 (Week 3): Pt will complete stand pivot transfer bed<>w/c with LRAD. PT Short Term Goal 3 (Week 3): Pt will initiate stair negotiation for strengthening.  PT Short Term Goal 4 (Week 3): Pt will complete bed mobility consistently with mod assist +1.  Skilled Therapeutic Interventions/Progress Updates:    Pt received supine on floor of her room with multiple RNs and NTs present due to unwitnessed fall. Pt reports attempting to readjust herself in w/c when she tipped over and ended up on the floor. Supine to sit with assist x 2. Vitals assessed in seated position on floor, see below. [place] transfer floor to w/c. Pt reports some L shoulder/upper trap pain following fall as well as L quad pain prior to fall, ice applied to both areas at end of therapy session, pain not rated. Pt reports need to use the bathroom. Stedy transfer w/c to toilet. Pt requires assist for 3/3 toileting steps. Stedy transfer toilet back to w/c. Sliding board transfer w/c to bed with min A to the R. Sit to supine mod A for BLE management. Set pt w/c up with 2 anti-tippers on back of w/c (1 was in place prior to fall) as well as RLE leg rest for w/c. Pt left seated in bed set up for lunch, call button in reach, bed alarm in place.  Therapy Documentation Precautions:  Precautions Precautions: Fall Precaution Comments: L hemi, L inattention/field cut Restrictions Weight Bearing Restrictions: No Vital Signs: Therapy Vitals Temp: 97.7 F (36.5 C) Pulse Rate: 71 Resp: 18 BP: (!) 148/85 Patient Position (if appropriate): Sitting Oxygen Therapy SpO2: 99 % O2 Device: Room  Air   See Function Navigator for Current Functional Status.   Therapy/Group: Individual Therapy  Excell Seltzer, PT, DPT  08/05/2018, 12:11 PM

## 2018-08-05 NOTE — Progress Notes (Signed)
Subjective/Complaints: Left thigh is sore today no fall or trauma  ROS: Denies CP, SOB, N/V/D   Objective: Vital Signs: Blood pressure (!) 149/89, pulse 78, temperature 98.2 F (36.8 C), resp. rate 18, height 5\' 6"  (1.676 m), weight 89 kg, SpO2 99 %. No results found. No results found for this or any previous visit (from the past 72 hour(s)).   Constitutional: No distress . Vital signs reviewed. HENT: Normocephalic.  Atraumatic. Eyes: EOMI. No discharge. Cardiovascular: RRR. No JVD. Respiratory: CTA bilaterally. Normal effort. GI: BS +. Non-distended. Musc:tender at anterior thigh but not medial or posterior thigh Skin:   Intact. Warm and dry. Neuro:  Motor: LUE 2- triceps and grip proximal distal, now with 2- finger ext Left lower extremity: Hip flexion, knee extension 3 -/5, ankle dorsiflexion 0/5 Left hamstring MAS 1  LUE + grasp and release Psych: Flat  Assessment/Plan: 1. 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 Physiatrist is providing close team supervision and 24 hour management of active medical problems listed below. Physiatrist and rehab team continue to assess barriers to discharge/monitor patient progress toward functional and medical goals. FIM: Function - Bathing Position: Wheelchair/chair at sink Body parts bathed by patient: Left arm, Chest, Abdomen, Front perineal area, Buttocks, Right upper leg, Left upper leg, Right lower leg, Left lower leg Body parts bathed by helper: Right arm, Back Bathing not applicable: Front perineal area, Buttocks Assist Level: Touching or steadying assistance(Pt > 75%)  Function- Upper Body Dressing/Undressing What is the patient wearing?: Pull over shirt/dress, Bra Bra - Perfomed by patient: Thread/unthread right bra strap, Thread/unthread left bra strap Bra - Perfomed by helper:  Hook/unhook bra (pull down sports bra) Pull over shirt/dress - Perfomed by patient: Thread/unthread right sleeve, Put head through opening, Pull shirt over trunk Pull over shirt/dress - Perfomed by helper: Thread/unthread left sleeve Assist Level: (Mod assist) Function - Lower Body Dressing/Undressing What is the patient wearing?: Underwear, Pants, Shoes, Ted Hose Position: Wheelchair/chair at [employer] - Performed by patient: Thread/unthread right underwear leg Underwear - Performed by helper: Thread/unthread left underwear leg, Pull underwear up/down Pants- Performed by patient: Thread/unthread right pants leg, Thread/unthread left pants leg Pants- Performed by helper: Pull pants up/down Non-skid slipper socks- Performed by helper: Don/doff right sock, Don/doff left sock Shoes - Performed by helper: Don/doff right shoe, Don/doff left shoe TED Hose - Performed by helper: Don/doff right TED hose, Don/doff left TED hose Assist for footwear: Maximal assist Assist for lower body dressing: (Max assist)  Function - Toileting Toileting steps completed by patient: Performs perineal hygiene Toileting steps completed by helper: Adjust clothing after toileting Toileting Assistive Devices: Grab bar or rail Assist level: Two helpers  Function - Air cabin crew transfer activity did not occur: Safety/medical concerns Toilet transfer assistive device: Elevated toilet seat/BSC over toilet, Grab bar, Drop arm commode Mechanical lift: Stedy Assist level to toilet: Maximal assist (Pt 25 - 49%/lift and lower) Assist level from toilet: Maximal assist (Pt 25 - 49%/lift and lower) Assist level to bedside commode (at bedside): 2 helpers Assist level from bedside commode (at bedside): 2 helpers  Function - Chair/bed transfer Chair/bed transfer method: Squat pivot Chair/bed transfer assist level: Moderate assist (Pt 50 - 74%/lift or lower) Chair/bed transfer assistive device: Sliding board,  Armrests Mechanical lift: Stedy Chair/bed transfer details: Verbal cues for technique, Verbal cues for sequencing  Function - Locomotion: Wheelchair Will  patient use wheelchair at discharge?: Yes Type: Manual Max wheelchair distance: 150 ft  Assist Level: Supervision or verbal cues Assist Level: Supervision or verbal cues Wheel 150 feet activity did not occur: Safety/medical concerns Assist Level: Supervision or verbal cues Turns around,maneuvers to table,bed, and toilet,negotiates 3% grade,maneuvers on rugs and over doorsills: No Function - Locomotion: Ambulation Assistive device: Walker-rolling(RW & L hand orthosis) Max distance: 6 ft  Assist level: 2 helpers(max assist + w/c follow for safety) Assist level: 2 helpers Walk 50 feet with 2 turns activity did not occur: Safety/medical concerns Walk 150 feet activity did not occur: Safety/medical concerns Walk 10 feet on uneven surfaces activity did not occur: Safety/medical concerns  Function - Comprehension Comprehension: Auditory Comprehension assist level: Understands complex 90% of the time/cues 10% of the time  Function - Expression Expression: Verbal Expression assist level: Expresses complex 90% of the time/cues < 10% of the time  Function - Social Interaction Social Interaction assist level: Interacts appropriately with others with medication or extra time (anti-anxiety, antidepressant).  Function - Problem Solving Problem solving assist level: Solves complex 90% of the time/cues < 10% of the time  Function - Memory Memory assist level: Complete Independence: No helper Patient normally able to recall (first 3 days only): Current season, That he or she is in a hospital, Location of own room, Staff names and faces  Medical Problem List and Plan: 1. Decline in functional status due to resultant left-sided weakness with sensory deficits and left homonymous  hemianopsia secondary to large Right occipitoparietal ICH  Cont  CIR PT, OT SLP Improved Left hand grasp/release   .2.  DVT Prophylaxis/Anticoagulation: Mechanical: Sequential compression devices, below knee Bilateral lower extremities 3. Pain Management: Continue Tylenol as needed for headaches, quad soreness left due to exercise no adductor or hamstring soreness 4. Mood: LCSW to follow for evaluation and support, neuropsych to follow , on Zoloft 50mg  Slept well 5. Neuropsych: This patient is capable of making decisions on her own behalf. 6. Skin/Wound Care: Routine pressure relief measures 7. Fluids/Electrolytes/Nutrition: Monitor I's and O's.  ask dietary to see for dietary, needs set up int sup for meals choices -overall improved  BMP within acceptable range on 9/9 8. HTN: Monitor blood pressure twice daily   Elevated systolic 8/34 goal <196QIWL Vitals:   08/05/18 0517 08/05/18 0626  BP: (!) 135/48 (!) 149/89  Pulse: (!) 56 78  Resp: 18 18  Temp: 98.2 F (36.8 C) 98.2 F (36.8 C)  SpO2: 100% 99%  9.  PAF: Monitor heart rate twice daily-- appears in NSR on Tambocor and metoprolol twice daily. Bradycardia improved metoprolol 12.5mg  BID and monitor, 10. Constipation: MiraLAX today.  May need to increase senna to twice daily 11.  Urinary retention: Monitor voiding with PVR checks.    Starting to void!Cont urecholine  LOS (Days) 17 A FACE TO FACE EVALUATION WAS PERFORMED  Charlett Blake 08/05/2018, 7:59 AM

## 2018-08-06 ENCOUNTER — Inpatient Hospital Stay (HOSPITAL_COMMUNITY): Payer: Medicare Other | Admitting: Speech Pathology

## 2018-08-06 ENCOUNTER — Inpatient Hospital Stay (HOSPITAL_COMMUNITY): Payer: Medicare Other | Admitting: Occupational Therapy

## 2018-08-06 ENCOUNTER — Inpatient Hospital Stay (HOSPITAL_COMMUNITY): Payer: Medicare Other | Admitting: Physical Therapy

## 2018-08-06 ENCOUNTER — Inpatient Hospital Stay (HOSPITAL_COMMUNITY): Payer: Medicare Other

## 2018-08-06 NOTE — Progress Notes (Signed)
Occupational Therapy Session Note  Patient Details  Name: [name] MRN: [id number] Date of Birth: 26-Dec-1945  Today's Date: 08/06/2018 OT Individual Time: 1303-1403 OT Individual Time Calculation (min): 60 min    Short Term Goals: Week 3:  OT Short Term Goal 1 (Week 3): Pt will complete LB dressing with min assist at sit > stand level OT Short Term Goal 2 (Week 3): Pt will complete toilet transfers with min assist OT Short Term Goal 3 (Week 3): Pt will complete shower transfers with mod assist OT Short Term Goal 4 (Week 3): Pt will utilize LUE as gross assist with min cues  Skilled Therapeutic Interventions/Progress Updates:    Treatment session with focus on LUE NMR, LB dressing, and functional transfers.  Pt received in bed finishing lunch.  Engaged in bed mobility with mod assist.  While seated EOB focus on LUE motor control to include doffing Rt sock in figure 4 position.  Pt able to do with increased time and max encouragement.  Engaged in transfer training completed stand pivot transfers with and without RW and squat pivot transfers.  Pt requires mod assist and max cues for foot placement and stepping pattern when completing stand pivot transfer with and without RW and min-mod assist when completing squat pivot transfer.  Educated on the need to continue to address both types of transfers for strengthening and dependent upon setup of situation.  Engaged in reaching activity with RUE while weight bearing through LUE with focus on anterior weight shifting as pt requires max cues and assist for anterior weight shift with sit > stand and transfers.  Kinesiotape re-applied to Lt shoulder to minimize shoulder subluxation and assist with positioning and pain management.  Pt left upright in w/c with seat belt alarm on and all needs in reach.  Therapy Documentation Precautions:  Precautions Precautions: Fall Precaution Comments: L hemi, L inattention/field cut Restrictions Weight  Bearing Restrictions: No General:   Vital Signs: Therapy Vitals Temp: 98.4 F (36.9 C) Temp Source: Oral Pulse Rate: 67 Resp: 18 BP: (!) 121/52 Patient Position (if appropriate): Sitting Oxygen Therapy SpO2: 98 % O2 Device: Room Air Pain: Pain Assessment Pain Scale: 0-10 Pain Score: 6  Faces Pain Scale: No hurt Pain Type: Acute pain Pain Location: Head Pain Descriptors / Indicators: Aching Pain Intervention(s): RN made aware  See Function Navigator for Current Functional Status.   Therapy/Group: Individual Therapy  [name] 08/06/2018, 3:42 PM

## 2018-08-06 NOTE — Progress Notes (Signed)
Subjective/Complaints: No further falls, no residual pain ROS: Denies CP, SOB, N/V/D   Objective: Vital Signs: Blood pressure (!) 146/56, pulse (!) 56, temperature 97.6 F (36.4 C), resp. rate 18, height 5\' 6"  (1.676 m), weight 89 kg, SpO2 98 %. Dg Wrist Complete Right  Result Date: 08/05/2018 CLINICAL DATA:  The patient fell from a wheelchair this morning and is complaining of right wrist pain. History of previous right wrist fracture treated with ORIF in May of 2006. EXAM: RIGHT WRIST - COMPLETE 3+ VIEW COMPARISON:  Preoperative right wrist radiographs of March 15, 2005. FINDINGS: The patient has undergone ORIF for a comminuted intra-articular fracture of the distal radial metaphysis. The metallic side plate appears to be in good position. There is no acute Ree fracture of the distal radius. The adjacent ulna is intact. The carpal bones and intercarpal joint spaces appear normal as do the metacarpals and CMC joints. The soft tissues exhibit no acute abnormalities. IMPRESSION: There is no acute right wrist fracture. The previously fractured distal right radius and the orthopedic hardware appear normal. Electronically Signed   By: David  [name] M.D.   On: 08/05/2018 13:25   Ct Head Wo Contrast  Result Date: 08/05/2018 CLINICAL DATA:  Fall from wheelchair today. No loss of consciousness. History of hypertension. EXAM: CT HEAD WITHOUT CONTRAST TECHNIQUE: Contiguous axial images were obtained from the base of the skull through the vertex without intravenous contrast. COMPARISON:  CT HEAD July 16, 2018 FINDINGS: BRAIN: Residual RIGHT parietal hematoma with extensive vasogenic edema resulting in regional mass effect. 4 mm RIGHT-to-LEFT midline shift, relatively unchanged. No parenchymal brain volume loss for age. No acute large vascular territory infarcts. No abnormal extra-axial fluid collections. Basal cisterns are patent. VASCULAR: Moderate calcific atherosclerosis of the carotid siphons. SKULL: No  skull fracture. Old nasal bone fractures. No significant scalp soft tissue swelling. SINUSES/ORBITS: Trace paranasal sinus mucosal thickening. Minimal LEFT mastoid effusion.The included ocular globes and orbital contents are non-suspicious. OTHER: None. IMPRESSION: 1. No acute intracranial process. 2. Evolving subacute RIGHT parietal hematoma with regional mass effect and similar 4 mm RIGHT to LEFT midline shift. No rebleed. Electronically Signed   By: Elon Alas M.D.   On: 08/05/2018 14:15   Dg Shoulder Left  Result Date: 08/05/2018 CLINICAL DATA:  The patient fell from a wheelchair this morning and is complaining of mild left shoulder pain. EXAM: LEFT SHOULDER - 2+ VIEW COMPARISON:  Limited views of the left shoulder from a chest x-ray of July 13, 2018 FINDINGS: The bones are subjectively adequately mineralized. The joint spaces are well maintained. There is no acute fracture or dislocation. The observed portions of the upper left ribs are normal. IMPRESSION: There is no acute bony abnormality of the left shoulder. Electronically Signed   By: David  [name] M.D.   On: 08/05/2018 13:22   Dg Femur Min 2 Views Left  Result Date: 08/05/2018 CLINICAL DATA:  Patient fell from a wheelchair this morning. The patient is complaining of left femur pain. EXAM: LEFT FEMUR 2 VIEWS COMPARISON:  Coronal and sagittal reconstructed images through the pelvis and left hip dated October 21, 2017 FINDINGS: The bones are subjectively adequately mineralized. There is no lytic or blastic lesion of the femur. There is minimal narrowing of the left hip joint space. The knee exhibits no acute abnormality. The soft tissues are unremarkable. IMPRESSION: There is no acute bony abnormality of the left femur. There is mild degenerative narrowing of the left hip joint space. Electronically Signed  By: David  [name] M.D.   On: 08/05/2018 13:24   No results found for this or any previous visit (from the past 72 hour(s)).    Constitutional: No distress . Vital signs reviewed. HENT: Normocephalic.  Atraumatic. Eyes: EOMI. No discharge. Cardiovascular: RRR. No JVD. Respiratory: CTA bilaterally. Normal effort. GI: BS +. Non-distended. Musc:tender at anterior thigh but not medial or posterior thigh Skin:   Intact. Warm and dry. Neuro:  Motor: LUE 2- triceps and grip proximal distal, now with 2- finger ext Left lower extremity: Hip flexion, knee extension 3 -/5, ankle dorsiflexion 0/5 Left hamstring MAS 1  LUE + grasp and release Psych: Flat  Assessment/Plan: 1. 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 Physiatrist is providing close team supervision and 24 hour management of active medical problems listed below. Physiatrist and rehab team continue to assess barriers to discharge/monitor patient progress toward functional and medical goals. FIM: Function - Bathing Position: Shower Body parts bathed by patient: Left arm, Chest, Abdomen, Right upper leg, Left upper leg, Right lower leg, Left lower leg Body parts bathed by helper: Right arm, Front perineal area, Buttocks, Back Bathing not applicable: Front perineal area, Buttocks Assist Level: (Mod assist)  Function- Upper Body Dressing/Undressing What is the patient wearing?: Pull over shirt/dress, Bra Bra - Perfomed by patient: Thread/unthread right bra strap, Thread/unthread left bra strap Bra - Perfomed by helper: Hook/unhook bra (pull down sports bra) Pull over shirt/dress - Perfomed by patient: Thread/unthread right sleeve, Put head through opening, Pull shirt over trunk Pull over shirt/dress - Perfomed by helper: Thread/unthread left sleeve Assist Level: (Mod assist) Function - Lower Body Dressing/Undressing What is the patient wearing?: Pants, Non-skid slipper socks, Ted Hose Position: Wheelchair/chair  at [employer] - Performed by patient: Thread/unthread right underwear leg Underwear - Performed by helper: Thread/unthread left underwear leg, Pull underwear up/down Pants- Performed by patient: Thread/unthread right pants leg, Thread/unthread left pants leg Pants- Performed by helper: Pull pants up/down Non-skid slipper socks- Performed by helper: Don/doff right sock, Don/doff left sock Shoes - Performed by helper: Don/doff right shoe, Don/doff left shoe TED Hose - Performed by helper: Don/doff right TED hose, Don/doff left TED hose Assist for footwear: Maximal assist Assist for lower body dressing: (Max assist)  Function - Toileting Toileting steps completed by patient: Performs perineal hygiene Toileting steps completed by helper: Adjust clothing prior to toileting, Performs perineal hygiene, Adjust clothing after toileting Toileting Assistive Devices: Grab bar or rail Assist level: Touching or steadying assistance (Pt.75%)  Function - Air cabin crew transfer activity did not occur: Safety/medical concerns Toilet transfer assistive device: Elevated toilet seat/BSC over toilet, Grab bar, Drop arm commode, Mechanical lift Mechanical lift: Stedy Assist level to toilet: Moderate assist (Pt 50 - 74%/lift or lower) Assist level from toilet: Moderate assist (Pt 50 - 74%/lift or lower) Assist level to bedside commode (at bedside): 2 helpers Assist level from bedside commode (at bedside): 2 helpers  Function - Chair/bed transfer Chair/bed transfer method: Lateral scoot Chair/bed transfer assist level: Touching or steadying assistance (Pt > 75%) Chair/bed transfer assistive device: Armrests, Sliding board Mechanical lift: Stedy Chair/bed transfer details: Verbal cues for technique, Verbal cues for sequencing, Verbal cues for precautions/safety, Verbal cues for safe use of DME/AE, Manual facilitation for weight shifting, Manual facilitation for placement  Function - Locomotion:  Wheelchair Will patient use wheelchair at discharge?: Yes Type: Manual Max  wheelchair distance: 150 ft  Assist Level: Supervision or verbal cues Assist Level: Supervision or verbal cues Wheel 150 feet activity did not occur: Safety/medical concerns Assist Level: Supervision or verbal cues Turns around,maneuvers to table,bed, and toilet,negotiates 3% grade,maneuvers on rugs and over doorsills: No Function - Locomotion: Ambulation Assistive device: Walker-rolling(RW & L hand orthosis) Max distance: 6 ft  Assist level: 2 helpers(max assist + w/c follow for safety) Assist level: 2 helpers Walk 50 feet with 2 turns activity did not occur: Safety/medical concerns Walk 150 feet activity did not occur: Safety/medical concerns Walk 10 feet on uneven surfaces activity did not occur: Safety/medical concerns  Function - Comprehension Comprehension: Auditory Comprehension assist level: Understands complex 90% of the time/cues 10% of the time  Function - Expression Expression: Verbal Expression assist level: Expresses complex 90% of the time/cues < 10% of the time  Function - Social Interaction Social Interaction assist level: Interacts appropriately with others with medication or extra time (anti-anxiety, antidepressant).  Function - Problem Solving Problem solving assist level: Solves complex 90% of the time/cues < 10% of the time  Function - Memory Memory assist level: Complete Independence: No helper Patient normally able to recall (first 3 days only): Current season, That he or she is in a hospital, Location of own room, Staff names and faces  Medical Problem List and Plan: 1. Decline in functional status due to resultant left-sided weakness with sensory deficits and left homonymous  hemianopsia secondary to large Right occipitoparietal ICH  Cont CIR PT, OT SLP Improved Left hand grasp/release   .2.  DVT Prophylaxis/Anticoagulation: Mechanical: Sequential compression devices, below  knee Bilateral lower extremities 3. Pain Management: Continue Tylenol as needed for headaches, quad soreness left due to exercise no adductor or hamstring soreness 4. Mood: LCSW to follow for evaluation and support, neuropsych to follow , on Zoloft 50mg  Slept well 5. Neuropsych: This patient is capable of making decisions on her own behalf. 6. Skin/Wound Care: Routine pressure relief measures 7. Fluids/Electrolytes/Nutrition: Monitor I's and O's.  ask dietary to see for dietary, needs set up int sup for meals choices -overall improved  BMP within acceptable range on 9/9 8. HTN: Monitor blood pressure twice daily   Elevated systolic 3/54 goal <656CLEX Vitals:   08/06/18 0502 08/06/18 0526  BP: (!) 122/49 (!) 146/56  Pulse: (!) 53 (!) 56  Resp: 18 18  Temp: 97.8 F (36.6 C) 97.6 F (36.4 C)  SpO2: 97% 98%  9.  PAF: Monitor heart rate twice daily-- appears in NSR on Tambocor and metoprolol twice daily. Bradycardia improved metoprolol 12.5mg  BID and monitor, 10. Constipation: MiraLAX today.  May need to increase senna to twice daily 11.  Urinary retention: Monitor voiding with PVR checks.    Starting to void!Cont urecholine 12.  Fall without  fx in wrist , hip, no SDH on CT head LOS (Days) 18 A FACE TO FACE EVALUATION WAS PERFORMED  Charlett Blake 08/06/2018, 7:58 AM

## 2018-08-06 NOTE — Progress Notes (Signed)
Speech Language Pathology Daily Session Note  Patient Details  Name: [name] MRN: [id number] Date of Birth: Dec 21, 1945  Today's Date: 08/06/2018 SLP Individual Time: 0215-0255 SLP Individual Time Calculation (min): 40 min  Short Term Goals: Week 3: SLP Short Term Goal 1 (Week 3): Patient will demonstrate functional problem solving for mildly complex tasks with supervision verbal cues. SLP Short Term Goal 2 (Week 3): Patient will self-monitor and correct errors during functional tasks with supervision verbal cues.  SLP Short Term Goal 3 (Week 3): Patient will attend to/locate items in left field of enviornment during functional tasks with Mod I A verbal cues.  SLP Short Term Goal 4 (Week 3): Patient will demonstrate anticipatory awareness by listing 3 safe and 3 unsafe activties upon discharge with supervision A question and verbal cues  Skilled Therapeutic Interventions:  Pt was seen for skilled ST targeting cognitive goals.  SLP facilitated the session with a meal planning task to address problem solving goals.  Pt was able to plan a meal for 4 people that included all necessary components and stayed within a predetermined budget with mod I.  Pt needed supervision cues to locate items to the left of midline when looking through a grocery store flyer for necessary items.  Pt was transferred back to bed at the end of today's therapy session with [facility] lift.  Pt was left in bed with bed alarm set and call bell within reach.  Continue per current plan of care.    Function:  Eating Eating                 Cognition Comprehension Comprehension assist level: Understands complex 90% of the time/cues 10% of the time  Expression   Expression assist level: Expresses complex 90% of the time/cues < 10% of the time  Social Interaction Social Interaction assist level: Interacts appropriately with others with medication or extra time (anti-anxiety, antidepressant).  Problem Solving  Problem solving assist level: Solves basic problems with no assist  Memory Memory assist level: Recognizes or recalls 90% of the time/requires cueing < 10% of the time    Pain Pain Assessment Pain Scale: 0-10 Pain Score: 6  Faces Pain Scale: No hurt Pain Type: Acute pain Pain Location: Head Pain Descriptors / Indicators: Aching Pain Intervention(s): RN made aware  Therapy/Group: Individual Therapy  Feige Lowdermilk, Selinda Orion 08/06/2018, 2:56 PM

## 2018-08-06 NOTE — Progress Notes (Signed)
Occupational Therapy Session Note  Patient Details  Name: [name] MRN: [id number] Date of Birth: Nov 22, 1945  Today's Date: 08/06/2018 OT Individual Time: 8270-7867 OT Individual Time Calculation (min): 55 min    Skilled Therapeutic Interventions/Progress Updates:    1;1. Pt seated bed reporting need to toilet. Pt completes stand pivot transfers throughout session with MOD A overall for lifting and balance with VC for stepping closer to surface and manual facilitation of foot placement. Pt completes toileting with A for clothing management only. Pt completesbathing and dressing at sit to stand level at sink with HOH A of LUE for NMR to wash RUE and MOD A to sit to stand at sink for advancing pants past hips. Pt rquries mod VC for hemi technques  And overall min A for LB dressing to steady LLE into seated figure 4. Exited session with pt set up in recliner with breakfast tray in room and call ight in reach and belt alarm on  Therapy Documentation Precautions:  Precautions Precautions: Fall Precaution Comments: L hemi, L inattention/field cut Restrictions Weight Bearing Restrictions: No General:   Vital Signs: Therapy Vitals Temp: 97.6 F (36.4 C) Pulse Rate: (!) 56 Resp: 18 BP: (!) 146/56 Patient Position (if appropriate): Sitting Oxygen Therapy SpO2: 98 % O2 Device: Room Air See Function Navigator for Current Functional Status.   Therapy/Group: Individual Therapy  Tonny Branch 08/06/2018, 8:57 AM

## 2018-08-06 NOTE — Progress Notes (Signed)
Social Work Patient ID: [name], female   DOB: 1946/04/19, 72 y.o.   MRN: [id number]  Met with pt and daughter-Sherry to answer their question regarding HCPOA and Living Will. Daughter to complete form and then will have chaplain come in next week to provide witnesses and notary. Work on discharge needs. Pt making good progress in therapies.

## 2018-08-06 NOTE — Progress Notes (Signed)
Physical Therapy Session Note  Patient Details  Name: [name] MRN: [id number] Date of Birth: April 24, 1946  Today's Date: 08/06/2018 PT Individual Time: 1015-1100 PT Individual Time Calculation (min): 45 min   Short Term Goals: Week 3:  PT Short Term Goal 1 (Week 3): Pt will ambulate 30 ft with LRAD & max assist +1. PT Short Term Goal 2 (Week 3): Pt will complete stand pivot transfer bed<>w/c with LRAD. PT Short Term Goal 3 (Week 3): Pt will initiate stair negotiation for strengthening.  PT Short Term Goal 4 (Week 3): Pt will complete bed mobility consistently with mod assist +1.  Skilled Therapeutic Interventions/Progress Updates:    Patient received up in chair, fatigued this morning but willing to participate in PT session. She required heavy ModAx2 for squat pivot transfers to the L side today, Mod verbal cues for technique and safety. Able to use B LEs to propel WC approximately 78f with Mod cues for active use of L LE and to prevent L LE from getting caught under WC during mobility. Able to gait train approximately 236fwith Mod assistx2/Mod cues with RW and tactile facilitation for appropriate progression/time of L LE; initially able to progress L LE forwards with tactile cues only however as fatigued increased began to require Mod assist. Took seated rest break during which therapist performed NMR techniques to reduce apparent tone in L hamstrings and tape was placed to trial potential toe cap during gait. Able to gait train approximately 3021fith MinAx2 and RW, occasional TC L LE and Mod VC/encouragement for gait techniques today. She then completed stand pivot transfer to the right with MInAx1/stand by of 1, able to assist in repositioning in bed with Mod cues for technique and use of bed rails. She was left in bed with all needs met, bed alarm activated this morning.   Therapy Documentation Precautions:  Precautions Precautions: Fall Precaution Comments: L hemi, L  inattention/field cut Restrictions Weight Bearing Restrictions: No   Pain: Pain Assessment Pain Scale: 0-10 Pain Score: 0-No pain Faces Pain Scale: No hurt   See Function Navigator for Current Functional Status.   Therapy/Group: Individual Therapy  KriDeniece Ree, DPT, CBIS  Supplemental Physical Therapist [facility] Pager [phone] Rehab Office [phone]/20/2019, 12:17 PM

## 2018-08-07 ENCOUNTER — Inpatient Hospital Stay (HOSPITAL_COMMUNITY): Payer: Medicare Other | Admitting: Physical Therapy

## 2018-08-07 MED ORDER — BETHANECHOL CHLORIDE 10 MG PO TABS
5.0000 mg | ORAL_TABLET | Freq: Three times a day (TID) | ORAL | Status: DC
  Administered 2018-08-07 – 2018-08-08 (×5): 5 mg via ORAL
  Filled 2018-08-07 (×5): qty 1

## 2018-08-07 NOTE — Progress Notes (Signed)
Physical Therapy Session Note  Patient Details  Name: [name] MRN: [id number] Date of Birth: 07/16/1946  Today's Date: 08/07/2018 PT Individual Time: 1405-1500 PT Individual Time Calculation (min): 55 min   Short Term Goals: Week 3:  PT Short Term Goal 1 (Week 3): Pt will ambulate 30 ft with LRAD & max assist +1. PT Short Term Goal 2 (Week 3): Pt will complete stand pivot transfer bed<>w/c with LRAD. PT Short Term Goal 3 (Week 3): Pt will initiate stair negotiation for strengthening.  PT Short Term Goal 4 (Week 3): Pt will complete bed mobility consistently with mod assist +1.  Skilled Therapeutic Interventions/Progress Updates:   Pt received supine in bed and agreeable to PT. Supine>sit transfer with min assist and min cues for sequencing and bed rail use.   PT instructed pt in gait training x 72ft with mod assist RW and L and splint. Moderate multimodal cues for improved hip/knee flexion to prevent foot drag, as well as improved activation of knee extensor in stance on the L. PT required to direct RW intermittently   Nustep reciprocal movement training with L hand splint 6 min x 2; level 1>2. PT provided cues for improved symmetry of movement as well as increased attention to the L LLE to improve hip control.   Pt returned to room and performed stand pivot transfer to the L with mod assist and UE support on bed rails. Supervision assist sit>supine transfer with cues for improved activation of hip musculature. Pt left in bed with all rail up per request.        Therapy Documentation Precautions:  Precautions Precautions: Fall Precaution Comments: L hemi, L inattention/field cut Restrictions Weight Bearing Restrictions: No  Therapy Vitals Pulse Rate: 71 Resp: 20 BP: (!) 141/67 Patient Position (if appropriate): Sitting Oxygen Therapy SpO2: 99 % O2 Device: Room Air Pain: denies  See Function Navigator for Current Functional Status.   Therapy/Group:  Individual Therapy  Lorie Phenix 08/07/2018, 5:49 PM

## 2018-08-07 NOTE — Progress Notes (Signed)
Subjective/Complaints: Patient upbeat today, moving left upper extremity ROS: Denies CP, SOB, N/V/D   Objective: Vital Signs: Blood pressure (!) 136/53, pulse 60, temperature 98.4 F (36.9 C), temperature source Oral, resp. rate 18, height 5\' 6"  (1.676 m), weight 89 kg, SpO2 96 %. Dg Wrist Complete Right  Result Date: 08/05/2018 CLINICAL DATA:  The patient fell from a wheelchair this morning and is complaining of right wrist pain. History of previous right wrist fracture treated with ORIF in May of 2006. EXAM: RIGHT WRIST - COMPLETE 3+ VIEW COMPARISON:  Preoperative right wrist radiographs of March 15, 2005. FINDINGS: The patient has undergone ORIF for a comminuted intra-articular fracture of the distal radial metaphysis. The metallic side plate appears to be in good position. There is no acute Ree fracture of the distal radius. The adjacent ulna is intact. The carpal bones and intercarpal joint spaces appear normal as do the metacarpals and CMC joints. The soft tissues exhibit no acute abnormalities. IMPRESSION: There is no acute right wrist fracture. The previously fractured distal right radius and the orthopedic hardware appear normal. Electronically Signed   By: David  [name] M.D.   On: 08/05/2018 13:25   Ct Head Wo Contrast  Result Date: 08/05/2018 CLINICAL DATA:  Fall from wheelchair today. No loss of consciousness. History of hypertension. EXAM: CT HEAD WITHOUT CONTRAST TECHNIQUE: Contiguous axial images were obtained from the base of the skull through the vertex without intravenous contrast. COMPARISON:  CT HEAD July 16, 2018 FINDINGS: BRAIN: Residual RIGHT parietal hematoma with extensive vasogenic edema resulting in regional mass effect. 4 mm RIGHT-to-LEFT midline shift, relatively unchanged. No parenchymal brain volume loss for age. No acute large vascular territory infarcts. No abnormal extra-axial fluid collections. Basal cisterns are patent. VASCULAR: Moderate calcific atherosclerosis  of the carotid siphons. SKULL: No skull fracture. Old nasal bone fractures. No significant scalp soft tissue swelling. SINUSES/ORBITS: Trace paranasal sinus mucosal thickening. Minimal LEFT mastoid effusion.The included ocular globes and orbital contents are non-suspicious. OTHER: None. IMPRESSION: 1. No acute intracranial process. 2. Evolving subacute RIGHT parietal hematoma with regional mass effect and similar 4 mm RIGHT to LEFT midline shift. No rebleed. Electronically Signed   By: Elon Alas M.D.   On: 08/05/2018 14:15   Dg Shoulder Left  Result Date: 08/05/2018 CLINICAL DATA:  The patient fell from a wheelchair this morning and is complaining of mild left shoulder pain. EXAM: LEFT SHOULDER - 2+ VIEW COMPARISON:  Limited views of the left shoulder from a chest x-ray of July 13, 2018 FINDINGS: The bones are subjectively adequately mineralized. The joint spaces are well maintained. There is no acute fracture or dislocation. The observed portions of the upper left ribs are normal. IMPRESSION: There is no acute bony abnormality of the left shoulder. Electronically Signed   By: David  [name] M.D.   On: 08/05/2018 13:22   Dg Femur Min 2 Views Left  Result Date: 08/05/2018 CLINICAL DATA:  Patient fell from a wheelchair this morning. The patient is complaining of left femur pain. EXAM: LEFT FEMUR 2 VIEWS COMPARISON:  Coronal and sagittal reconstructed images through the pelvis and left hip dated October 21, 2017 FINDINGS: The bones are subjectively adequately mineralized. There is no lytic or blastic lesion of the femur. There is minimal narrowing of the left hip joint space. The knee exhibits no acute abnormality. The soft tissues are unremarkable. IMPRESSION: There is no acute bony abnormality of the left femur. There is mild degenerative narrowing of the left hip joint space. Electronically  Signed   By: David  [name] M.D.   On: 08/05/2018 13:24   No results found for this or any previous visit  (from the past 72 hour(s)).   Constitutional: No distress . Vital signs reviewed. HENT: Normocephalic.  Atraumatic. Eyes: EOMI. No discharge. Cardiovascular: RRR. No JVD. Respiratory: CTA bilaterally. Normal effort. GI: BS +. Non-distended. Musc:tender at anterior thigh but not medial or posterior thigh Skin:   Intact. Warm and dry. Neuro:  Motor: LUE 3- triceps and grip proximal distal, now with 3- finger ext Left lower extremity: Hip flexion, knee extension 3 -/5, ankle dorsiflexion 0/5, 2- ankle plantarflexion Left hamstring MAS 1  Psych: Brighter affect  Assessment/Plan: 1. 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 Physiatrist is providing close team supervision and 24 hour management of active medical problems listed below. Physiatrist and rehab team continue to assess barriers to discharge/monitor patient progress toward functional and medical goals. FIM: Function - Bathing Position: Shower Body parts bathed by patient: Left arm, Chest, Abdomen, Right upper leg, Left upper leg, Right lower leg, Left lower leg Body parts bathed by helper: Right arm, Front perineal area, Buttocks, Back Bathing not applicable: Front perineal area, Buttocks Assist Level: (Mod assist)  Function- Upper Body Dressing/Undressing What is the patient wearing?: Pull over shirt/dress, Bra Bra - Perfomed by patient: Thread/unthread right bra strap, Thread/unthread left bra strap Bra - Perfomed by helper: Hook/unhook bra (pull down sports bra) Pull over shirt/dress - Perfomed by patient: Thread/unthread right sleeve, Put head through opening, Pull shirt over trunk Pull over shirt/dress - Perfomed by helper: Thread/unthread left sleeve Assist Level: (Mod assist) Function - Lower Body Dressing/Undressing What is the patient wearing?: Pants, Non-skid slipper  socks, Ted Hose Position: Wheelchair/chair at [employer] - Performed by patient: Thread/unthread right underwear leg Underwear - Performed by helper: Thread/unthread left underwear leg, Pull underwear up/down Pants- Performed by patient: Thread/unthread right pants leg, Thread/unthread left pants leg Pants- Performed by helper: Pull pants up/down Non-skid slipper socks- Performed by helper: Don/doff right sock, Don/doff left sock Shoes - Performed by helper: Don/doff right shoe, Don/doff left shoe TED Hose - Performed by helper: Don/doff right TED hose, Don/doff left TED hose Assist for footwear: Maximal assist Assist for lower body dressing: (Max assist)  Function - Toileting Toileting steps completed by patient: Performs perineal hygiene Toileting steps completed by helper: Adjust clothing prior to toileting, Performs perineal hygiene, Adjust clothing after toileting Toileting Assistive Devices: Grab bar or rail Assist level: Touching or steadying assistance (Pt.75%)  Function - Air cabin crew transfer activity did not occur: Safety/medical concerns Toilet transfer assistive device: Elevated toilet seat/BSC over toilet, Grab bar, Drop arm commode, Mechanical lift Mechanical lift: Stedy Assist level to toilet: Moderate assist (Pt 50 - 74%/lift or lower) Assist level from toilet: Moderate assist (Pt 50 - 74%/lift or lower) Assist level to bedside commode (at bedside): 2 helpers Assist level from bedside commode (at bedside): 2 helpers  Function - Chair/bed transfer Chair/bed transfer method: Squat pivot Chair/bed transfer assist level: 2 helpers Chair/bed transfer assistive device: Armrests Mechanical lift: Stedy Chair/bed transfer details: Verbal cues for technique, Verbal cues for sequencing, Verbal cues for precautions/safety, Verbal cues for safe use of DME/AE, Manual facilitation for weight shifting, Manual facilitation for placement  Function - Locomotion:  Wheelchair Will patient use wheelchair at discharge?: Yes Type: Manual Max wheelchair distance: 150 ft  Assist Level: Supervision or verbal cues Assist Level: Supervision or verbal cues Wheel 150 feet activity did not occur: Safety/medical concerns Assist Level: Supervision or verbal cues Turns around,maneuvers to table,bed, and toilet,negotiates 3% grade,maneuvers on rugs and over doorsills: No Function - Locomotion: Ambulation Assistive device: Walker-rolling Max distance: 30 Assist level: 2 helpers Assist level: 2 helpers Walk 50 feet with 2 turns activity did not occur: Safety/medical concerns Walk 150 feet activity did not occur: Safety/medical concerns Walk 10 feet on uneven surfaces activity did not occur: Safety/medical concerns  Function - Comprehension Comprehension: Auditory Comprehension assist level: Understands complex 90% of the time/cues 10% of the time  Function - Expression Expression: Verbal Expression assist level: Expresses complex 90% of the time/cues < 10% of the time  Function - Social Interaction Social Interaction assist level: Interacts appropriately with others with medication or extra time (anti-anxiety, antidepressant).  Function - Problem Solving Problem solving assist level: Solves basic problems with no assist  Function - Memory Memory assist level: Recognizes or recalls 90% of the time/requires cueing < 10% of the time Patient normally able to recall (first 3 days only): Current season, That he or she is in a hospital, Location of own room, Staff names and faces  Medical Problem List and Plan: 1. Decline in functional status due to resultant left-sided weakness with sensory deficits and left homonymous  hemianopsia secondary to large Right occipitoparietal ICH  Cont CIR PT, OT SLP Improved left upper extremity movement in all joints   .2.  DVT Prophylaxis/Anticoagulation: Mechanical: Sequential compression devices, below knee Bilateral lower  extremities 3. Pain Management: Continue Tylenol as needed for headaches, quad soreness left due to exercise no adductor or hamstring soreness 4. Mood: LCSW to follow for evaluation and support, neuropsych to follow , on Zoloft 50mg  Slept well 5. Neuropsych: This patient is capable of making decisions on her own behalf. 6. Skin/Wound Care: Routine pressure relief measures 7. Fluids/Electrolytes/Nutrition: Monitor I's and O's.  ask dietary to see for dietary, needs set up int sup for meals choices -overall improved  BMP within acceptable range on 9/9 8. HTN: Monitor blood pressure twice daily   Controlled 08/07/2018 Vitals:   08/07/18 0445 08/07/18 0952  BP: (!) 134/46 (!) 136/53  Pulse: (!) 54 60  Resp: 18 18  Temp: 98.4 F (36.9 C)   SpO2: 98% 96%  9.  PAF: Monitor heart rate twice daily-- appears in NSR on Tambocor and metoprolol twice daily. Bradycardia improved metoprolol 12.5mg  BID and monitor, 10. Constipation: MiraLAX today.  May need to increase senna to twice daily 11.  Urinary retention: Monitor voiding with PVR checks.    Starting to void!  Wean Urecholine 12.  Fall without  fx in wrist , hip, no SDH on CT head LOS (Days) 19 A FACE TO FACE EVALUATION WAS PERFORMED  Charlett Blake 08/07/2018, 1:00 PM

## 2018-08-08 ENCOUNTER — Inpatient Hospital Stay (HOSPITAL_COMMUNITY): Payer: Medicare Other

## 2018-08-08 NOTE — Progress Notes (Signed)
Occupational Therapy Session Note  Patient Details  Name: [name] MRN: [id number] Date of Birth: 04/20/1946  Today's Date: 08/08/2018 OT Individual Time: 1120-1200 OT Individual Time Calculation (min): 40 min    Short Term Goals: Week 3:  OT Short Term Goal 1 (Week 3): Pt will complete LB dressing with min assist at sit > stand level OT Short Term Goal 2 (Week 3): Pt will complete toilet transfers with min assist OT Short Term Goal 3 (Week 3): Pt will complete shower transfers with mod assist OT Short Term Goal 4 (Week 3): Pt will utilize LUE as gross assist with min cues  Skilled Therapeutic Interventions/Progress Updates:    1;1. Pt received seated in bed with teds and abdominal binder donned reporting low BP when taken by RN earlier. OT monitors vitals 115/67 supine. OT applies ace wraps to BLE and BP improves to 116/72 sitting EOB with no increase in symptoms. Pt washes BUE chest and back with HOH A of LUE to wash RUE. Pt brushes teeth with HOH A to squeeze toothpaste onto toothbrush with RUE. Pt dons shirt with min A overall to thread LUE and max VC for clothing orientation. Pt completes threading BLE with min A to stabilize LLE. Pt sit to stand in stedy with MIN A for OT to advance pants past hips. Pt left semi reclined in bed iwht exit alarm on, call light in reach and all needs met  Therapy Documentation Precautions:  Precautions Precautions: Fall Precaution Comments: L hemi, L inattention/field cut Restrictions Weight Bearing Restrictions: No General:   Vital Signs: Therapy Vitals Pulse Rate: (!) 56 Resp: 18 BP: (!) 122/51 Patient Position (if appropriate): Lying Oxygen Therapy SpO2: 99 % O2 Device: Room Air  See Function Navigator for Current Functional Status.   Therapy/Group: Individual Therapy  Tonny Branch 08/08/2018, 12:04 PM

## 2018-08-09 ENCOUNTER — Inpatient Hospital Stay (HOSPITAL_COMMUNITY): Payer: Medicare Other

## 2018-08-09 ENCOUNTER — Inpatient Hospital Stay (HOSPITAL_COMMUNITY): Payer: Medicare Other | Admitting: Occupational Therapy

## 2018-08-09 ENCOUNTER — Inpatient Hospital Stay (HOSPITAL_COMMUNITY): Payer: Medicare Other | Admitting: Physical Therapy

## 2018-08-09 LAB — BASIC METABOLIC PANEL
ANION GAP: 10 (ref 5–15)
BUN: 19 mg/dL (ref 8–23)
CALCIUM: 10.3 mg/dL (ref 8.9–10.3)
CO2: 25 mmol/L (ref 22–32)
Chloride: 104 mmol/L (ref 98–111)
Creatinine, Ser: 0.98 mg/dL (ref 0.44–1.00)
GFR, EST NON AFRICAN AMERICAN: 57 mL/min — AB (ref >60)
GLUCOSE: 102 mg/dL — AB (ref 70–99)
Potassium: 4.3 mmol/L (ref 3.5–5.1)
Sodium: 139 mmol/L (ref 135–145)

## 2018-08-09 LAB — CBC
HCT: 40.8 % (ref 36.0–46.0)
HEMOGLOBIN: 13.8 g/dL (ref 12.0–15.0)
MCH: 30.3 pg (ref 26.0–34.0)
MCHC: 33.8 g/dL (ref 30.0–36.0)
MCV: 89.5 fL (ref 78.0–100.0)
Platelets: 203 10*3/uL (ref 150–400)
RBC: 4.56 MIL/uL (ref 3.87–5.11)
RDW: 12.4 % (ref 11.5–15.5)
WBC: 5.7 10*3/uL (ref 4.0–10.5)

## 2018-08-09 NOTE — Progress Notes (Signed)
Speech Language Pathology Weekly Progress and Session Note  Patient Details  Name: [name] MRN: [id number] Date of Birth: January 29, 1946  Beginning of progress report period: August 02, 2018 End of progress report period: August 09, 2018  Today's Date: 08/09/2018 SLP Individual Time: 1420-1500 SLP Individual Time Calculation (min): 40 min  Short Term Goals: Week 3: SLP Short Term Goal 1 (Week 3): Patient will demonstrate functional problem solving for mildly complex tasks with supervision verbal cues. SLP Short Term Goal 1 - Progress (Week 3): Progressing toward goal SLP Short Term Goal 2 (Week 3): Patient will self-monitor and correct errors during functional tasks with supervision verbal cues.  SLP Short Term Goal 2 - Progress (Week 3): Progressing toward goal SLP Short Term Goal 3 (Week 3): Patient will attend to/locate items in left field of enviornment during functional tasks with Mod I A verbal cues.  SLP Short Term Goal 3 - Progress (Week 3): Progressing toward goal SLP Short Term Goal 4 (Week 3): Patient will demonstrate anticipatory awareness by listing 3 safe and 3 unsafe activties upon discharge with supervision A question and verbal cues SLP Short Term Goal 4 - Progress (Week 3): Progressing toward goal    New Short Term Goals: Week 4: SLP Short Term Goal 1 (Week 4): STG=LTG due to ELOS  Weekly Progress Updates: Pt has made progress on 4 out 4 goals, however not consistent enough to met goal level in all environments. SLP adminstered Right Hemisphere Inventory, Pt received functional scores on all sub-tests with the exception of the clock drawing (Pt didn't write any numbers to left of midline.) Pt has made great progress within structured tasks however when distractions increase, exhibites decreased functional ability and decreased left attention. SLP will focus on mildly complex problem solving, error awareness, midline/left attention and anticipatory awareness  piror to discharge requiring 24/hour supervision A and continued ST services at next level of care.      Intensity: Minumum of 1-2 x/day, 30 to 90 minutes Frequency: 3 to 5 out of 7 days Duration/Length of Stay: 9/28 Treatment/Interventions: Cognitive remediation/compensation;Environmental controls;Internal/external aids;Therapeutic Activities;Patient/family education;Cueing hierarchy;Functional tasks   Daily Session  Skilled Therapeutic Interventions: Skilled ST services focused on cognitive skills. SLP facilitated semi-complex problem solving skils utilizing personal medication management task, pt required min A fading to supervision A question cues for problem solving and error awareness. SLP unable to complete task and will continue in next session. SLP facilitated scanning to midline and left visual field in functional task, pt demonstrated mod I ability to scan left, however errors made in medication task were at midline. Pt was in room with door open, however will plan for more distracting environment in further sessions to assess less structured environments.  Pt was left in room with call bell within reach and bed alarm set. Recommend to continue skilled ST services.      Function:   Eating Eating                 Cognition Comprehension Comprehension assist level: Understands complex 90% of the time/cues 10% of the time  Expression   Expression assist level: Expresses complex 90% of the time/cues < 10% of the time  Social Interaction Social Interaction assist level: Interacts appropriately with others with medication or extra time (anti-anxiety, antidepressant).  Problem Solving Problem solving assist level: Solves basic problems with no assist  Memory Memory assist level: Recognizes or recalls 90% of the time/requires cueing < 10% of the time  General    Pain Pain Assessment Pain Score: 0-No pain  Therapy/Group: Individual Therapy  Kamiya Acord  [facility] 08/09/2018, 3:56  PM

## 2018-08-09 NOTE — Progress Notes (Signed)
Subjective/Complaints: Moving Left side better, low HR yesterday with some dizziness per pt,OT recorded stable BP supine to sit ROS: Denies CP, SOB, N/V/D   Objective: Vital Signs: Blood pressure (!) 146/51, pulse (!) 55, temperature 98.1 F (36.7 C), temperature source Oral, resp. rate 12, height '5\' 6"'  (1.676 m), weight 89 kg, SpO2 99 %. No results found. Results for orders placed or performed during the hospital encounter of 07/19/18 (from the past 72 hour(s))  Basic metabolic panel     Status: Abnormal   Collection Time: 08/09/18  7:03 AM  Result Value Ref Range   Sodium 139 135 - 145 mmol/L   Potassium 4.3 3.5 - 5.1 mmol/L   Chloride 104 98 - 111 mmol/L   CO2 25 22 - 32 mmol/L   Glucose, Bld 102 (H) 70 - 99 mg/dL   BUN 19 8 - 23 mg/dL   Creatinine, Ser 0.98 0.44 - 1.00 mg/dL   Calcium 10.3 8.9 - 10.3 mg/dL   GFR calc non Af Amer 57 (L) >60 mL/min   GFR calc Af Amer >60 >60 mL/min    Comment: (NOTE) The eGFR has been calculated using the CKD EPI equation. This calculation has not been validated in all clinical situations. eGFR's persistently <60 mL/min signify possible Chronic Kidney Disease.    Anion gap 10 5 - 15    Comment: Performed at [facility] [address], [place] [address]  CBC     Status: None   Collection Time: 08/09/18  7:03 AM  Result Value Ref Range   WBC 5.7 4.0 - 10.5 K/uL   RBC 4.56 3.87 - 5.11 MIL/uL   Hemoglobin 13.8 12.0 - 15.0 g/dL   HCT 40.8 36.0 - 46.0 %   MCV 89.5 78.0 - 100.0 fL   MCH 30.3 26.0 - 34.0 pg   MCHC 33.8 30.0 - 36.0 g/dL   RDW 12.4 11.5 - 15.5 %   Platelets 203 150 - 400 K/uL    Comment: Performed at [facility], [place] [address], [place], [place] [address]     Constitutional: No distress . Vital signs reviewed. HENT: Normocephalic.  Atraumatic. Eyes: EOMI. No discharge. Cardiovascular: RRR. No JVD. Respiratory: CTA bilaterally. Normal effort. GI: BS +. Non-distended. Musc:tender at anterior thigh  but not medial or posterior thigh Skin:   Intact. Warm and dry. Neuro:  Motor: LUE 3 triceps and grip proximal distal, now with 3- finger ext Left lower extremity: Hip flexion, knee extension 3 -/5, ankle dorsiflexion 1/5, 2- ankle plantarflexion Left hamstring MAS 1  Psych: Brighter affect  Assessment/Plan: 1. 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 Physiatrist is providing close team supervision and 24 hour management of active medical problems listed below. Physiatrist and rehab team continue to assess barriers to discharge/monitor patient progress toward functional and medical goals. FIM: Function - Bathing Position: Shower Body parts bathed by patient: Left arm, Chest, Abdomen, Right upper leg, Left upper leg, Right lower leg, Left lower leg Body parts bathed by helper: Right arm, Front perineal area, Buttocks, Back Bathing not applicable: Front perineal area, Buttocks Assist Level: (Mod assist)  Function- Upper Body Dressing/Undressing What is the patient wearing?: Pull over shirt/dress, Bra Bra - Perfomed by patient: Thread/unthread right bra strap, Thread/unthread left bra strap Bra - Perfomed by helper: Hook/unhook bra (pull down sports bra) Pull over shirt/dress -  Perfomed by patient: Thread/unthread right sleeve, Put head through opening, Pull shirt over trunk Pull over shirt/dress - Perfomed by helper: Thread/unthread left sleeve Assist Level: (Mod assist) Function - Lower Body Dressing/Undressing What is the patient wearing?: Pants, Non-skid slipper socks, Ted Hose Position: Wheelchair/chair at [employer] - Performed by patient: Thread/unthread right underwear leg Underwear - Performed by helper: Thread/unthread left underwear leg, Pull underwear up/down Pants- Performed by patient: Thread/unthread right pants leg,  Thread/unthread left pants leg Pants- Performed by helper: Pull pants up/down Non-skid slipper socks- Performed by helper: Don/doff right sock, Don/doff left sock Shoes - Performed by helper: Don/doff right shoe, Don/doff left shoe TED Hose - Performed by helper: Don/doff right TED hose, Don/doff left TED hose Assist for footwear: Maximal assist Assist for lower body dressing: (Max assist)  Function - Toileting Toileting steps completed by patient: Performs perineal hygiene Toileting steps completed by helper: Adjust clothing prior to toileting, Performs perineal hygiene, Adjust clothing after toileting Toileting Assistive Devices: Grab bar or rail Assist level: Touching or steadying assistance (Pt.75%)  Function - Air cabin crew transfer activity did not occur: Safety/medical concerns Toilet transfer assistive device: Elevated toilet seat/BSC over toilet, Grab bar, Drop arm commode, Mechanical lift Mechanical lift: Stedy Assist level to toilet: Moderate assist (Pt 50 - 74%/lift or lower) Assist level from toilet: Moderate assist (Pt 50 - 74%/lift or lower) Assist level to bedside commode (at bedside): 2 helpers Assist level from bedside commode (at bedside): 2 helpers  Function - Chair/bed transfer Chair/bed transfer method: Squat pivot Chair/bed transfer assist level: 2 helpers Chair/bed transfer assistive device: Armrests Mechanical lift: Stedy Chair/bed transfer details: Verbal cues for technique, Verbal cues for sequencing, Verbal cues for precautions/safety, Verbal cues for safe use of DME/AE, Manual facilitation for weight shifting, Manual facilitation for placement  Function - Locomotion: Wheelchair Will patient use wheelchair at discharge?: Yes Type: Manual Max wheelchair distance: 150 ft  Assist Level: Supervision or verbal cues Assist Level: Supervision or verbal cues Wheel 150 feet activity did not occur: Safety/medical concerns Assist Level: Supervision or  verbal cues Turns around,maneuvers to table,bed, and toilet,negotiates 3% grade,maneuvers on rugs and over doorsills: No Function - Locomotion: Ambulation Assistive device: Walker-rolling Max distance: 30 Assist level: 2 helpers Assist level: 2 helpers Walk 50 feet with 2 turns activity did not occur: Safety/medical concerns Walk 150 feet activity did not occur: Safety/medical concerns Walk 10 feet on uneven surfaces activity did not occur: Safety/medical concerns  Function - Comprehension Comprehension: Auditory Comprehension assist level: Understands complex 90% of the time/cues 10% of the time  Function - Expression Expression: Verbal Expression assist level: Expresses complex 90% of the time/cues < 10% of the time  Function - Social Interaction Social Interaction assist level: Interacts appropriately with others with medication or extra time (anti-anxiety, antidepressant).  Function - Problem Solving Problem solving assist level: Solves basic problems with no assist  Function - Memory Memory assist level: Recognizes or recalls 90% of the time/requires cueing < 10% of the time Patient normally able to recall (first 3 days only): Current season, That he or she is in a hospital, Location of own room, Staff names and faces  Medical Problem List and Plan: 1. Decline in functional status due to resultant left-sided weakness with sensory deficits and left homonymous  hemianopsia secondary to large Right occipitoparietal ICH  Cont CIR PT, OT SLP Improved left upper and lower extremity movement in all joints   .2.  DVT Prophylaxis/Anticoagulation: Mechanical: Sequential compression devices,  below knee Bilateral lower extremities 3. Pain Management: Continue Tylenol as needed for headaches, quad soreness left due to exercise no adductor or hamstring soreness 4. Mood: LCSW to follow for evaluation and support, neuropsych to follow , on Zoloft 31m Slept well 5. Neuropsych: This patient  is capable of making decisions on her own behalf. 6. Skin/Wound Care: Routine pressure relief measures 7. Fluids/Electrolytes/Nutrition: Monitor I's and O's.  ask dietary to see for dietary, needs set up int sup for meals choices -overall improved  BMP wnl 9/23 8. HTN: Monitor blood pressure twice daily   Controlled 08/09/2018 Vitals:   08/08/18 2007 08/09/18 0525  BP: (!) 141/50 (!) 146/51  Pulse: (!) 58 (!) 55  Resp: 18 12  Temp: 97.6 F (36.4 C) 98.1 F (36.7 C)  SpO2: 97% 99%  9.  PAF: Monitor heart rate twice daily-- appears in NSR on Tambocor and metoprolol twice daily. Bradycardia improved initially with reduction of metoprolol to  12.589mBID  ? Dizziness due to HR, only in 50s without orthostatic BP drop.  Monitor for now but may need to reduce to 6.2574mID 10. Constipation: MiraLAX today.  May need to increase senna to twice daily 11.  Urinary retention: Monitor voiding with PVR checks.    Starting to void!  D/c Urecholine 12.  Fall without  fx in wrist , hip, no SDH on CT head LOS (Days) 21 A FACE TO FACE EVALUATION WAS PERFORMED  AndCharlett Blake23/2019, 8:21 AM

## 2018-08-09 NOTE — Progress Notes (Signed)
Occupational Therapy Session Note  Patient Details  Name: [name] MRN: [id number] Date of Birth: Mar 17, 1946  Today's Date: 08/09/2018 OT Individual Time: 3546-5681 OT Individual Time Calculation (min): 57 min    Short Term Goals: Week 3:  OT Short Term Goal 1 (Week 3): Pt will complete LB dressing with min assist at sit > stand level OT Short Term Goal 2 (Week 3): Pt will complete toilet transfers with min assist OT Short Term Goal 3 (Week 3): Pt will complete shower transfers with mod assist OT Short Term Goal 4 (Week 3): Pt will utilize LUE as gross assist with min cues  Skilled Therapeutic Interventions/Progress Updates:    Treatment session with focus on ADL retraining with functional transfers, sit > stand, dynamic standing balance, and functional use of LUE during self-care tasks.  Pt received supine in bed expressing desire to complete bathing at shower level.  Completed squat pivot transfer bed > w/c > tub bench in room shower with mod assist.  Pt demonstrating improved weight shifting during transfers, still requiring cues for setup of positioning and attention to LLE prior to transfer.  Utilized LUE during bathing with pt able to wash RUE this session, including underarm.  Increased challenge to utilizing LUE to wash LLE to promote increased wrist and elbow extension.  Mod assist sit > stand with pt washing buttocks while therapist providing physical assistance to maintain dynamic standing balance.  Engaged in dressing at sit > stand level with pt utilizing figure 4 position to thread underwear and pants.  Able to stand with mod assist and pt able to pull pants over Rt hip, however unable to weight shift enough to Lt to allow pt to reach behind and pull pants up over Lt hip.  Placed LUE on sink to increase weight bearing and facilitate elbow and wrist extension while completing LB dressing tasks in standing.  Squat pivot w/c > recliner and left upright in recliner with legs  elevated and seat belt alarm on and call bell in reach.  Therapy Documentation Precautions:  Precautions Precautions: Fall Precaution Comments: L hemi, L inattention/field cut Restrictions Weight Bearing Restrictions: No General:   Vital Signs: Therapy Vitals Temp: 98.1 F (36.7 C) Temp Source: Oral Pulse Rate: (!) 55 Resp: 12 BP: (!) 146/51 Patient Position (if appropriate): Lying Oxygen Therapy SpO2: 99 % Pain:  Pt reports HA 3/10.  Did not want medication at this time.  See Function Navigator for Current Functional Status.   Therapy/Group: Individual Therapy  [name] 08/09/2018, 9:03 AM

## 2018-08-09 NOTE — Progress Notes (Signed)
Occupational Therapy Session Note  Patient Details  Name: [name] MRN: [id number] Date of Birth: 1946/01/09  Today's Date: 08/09/2018 OT Individual Time: 0175-1025 OT Individual Time Calculation (min): 30 min    Short Term Goals: Week 3:  OT Short Term Goal 1 (Week 3): Pt will complete LB dressing with min assist at sit > stand level OT Short Term Goal 2 (Week 3): Pt will complete toilet transfers with min assist OT Short Term Goal 3 (Week 3): Pt will complete shower transfers with mod assist OT Short Term Goal 4 (Week 3): Pt will utilize LUE as gross assist with min cues  Skilled Therapeutic Interventions/Progress Updates:    Treatment session with focus on LUE NMR.  Pt received supine in bed, completed bed mobility with min guard.  Engaged in [place] in sitting at EOB with focus on elbow and wrist extension.  Incorporated functional tasks of reaching for cup and hairbrush to incorporate extension and external rotation at shoulder.  Noted increase in tone with elbow extension when in mid range.  Engaged in [place] during reaching and therapist provided min tactile cues when reaching in mid range to obtain items from table top.  Pt demonstrating improved ability to reach in mid range by end of session, however continues to require tactile cues to minimize effects of gravity and requires increased time to release grasp on items.  Completed lateral scoots along EOB to focus on anterior weight shift and facilitate equal weight bearing through BLE during transitional movements.  Pt returned to semi-reclined in bed and left with all needs in reach.  Therapy Documentation Precautions:  Precautions Precautions: Fall Precaution Comments: L hemi, L inattention/field cut Restrictions Weight Bearing Restrictions: No General:   Vital Signs: Therapy Vitals Temp: 98.9 F (37.2 C) Temp Source: Oral Pulse Rate: (!) 59 Resp: 18 BP: (!) 117/52 Patient Position (if appropriate):  Lying Oxygen Therapy SpO2: 97 % O2 Device: Room Air Pain: Pain Assessment Pain Score: 0-No pain  See Function Navigator for Current Functional Status.   Therapy/Group: Individual Therapy  [name] 08/09/2018, 4:17 PM

## 2018-08-09 NOTE — Progress Notes (Addendum)
Physical Therapy Weekly Progress Note  Patient Details  Name: [name] MRN: [id number] Date of Birth: 12/15/1945  Beginning of progress report period: August 02, 2018 End of progress report period: August 09, 2018  Today's Date: 08/09/2018 PT Individual Time: 1005-1101 PT Individual Time Calculation (min): 56 min   Patient has met 4 of 4 short term goals.  Pt is making good progress towards LTGs as she is now able to ambulate with RW & min/mod assist for short distances. Pt has been able to negotiate steps (3") with R rail and min assist. Pt would benefit from continued skilled PT treatment to focus on transfers, gait, stair negotiation, L NMR, balance, and endurance training prior to d/c.   Patient continues to demonstrate the following deficits muscle weakness, decreased cardiorespiratoy endurance, decreased coordination, decreased visual perceptual skills, decreased attention to left, decreased awareness, decreased problem solving, decreased safety awareness, decreased memory and delayed processing, and decreased sitting balance, decreased standing balance, decreased postural control, hemiplegia and decreased balance strategies and therefore will continue to benefit from skilled PT intervention to increase functional independence with mobility.  Patient progressing toward long term goals..  Continue plan of care.  PT Short Term Goals Week 3:  PT Short Term Goal 1 (Week 3): Pt will ambulate 30 ft with LRAD & max assist +1. PT Short Term Goal 1 - Progress (Week 3): Met PT Short Term Goal 2 (Week 3): Pt will complete stand pivot transfer bed<>w/c with LRAD. PT Short Term Goal 2 - Progress (Week 3): Met PT Short Term Goal 3 (Week 3): Pt will initiate stair negotiation for strengthening.  PT Short Term Goal 3 - Progress (Week 3): Met PT Short Term Goal 4 (Week 3): Pt will complete bed mobility consistently with mod assist +1. PT Short Term Goal 4 - Progress (Week 3): Met Week  4:  PT Short Term Goal 1 (Week 4): STG = LTG due to estimated d/c date.   Skilled Therapeutic Interventions/Progress Updates:  Pt received in recliner & agreeable to tx reporting 4/10 HA but premedicated & reports drowsiness. Pt reports she plans to d/c to SNF from CIR for continued therapy prior to return home. Vitals checked per dinamap; BP = 135/70 mmHg (LUE, sitting), HR = 72 bpm & pt with thigh high ted hose already donned. Pt ambulates recliner>w/c with RW & L hand splint with min assist with occasional cuing for L foot clearance 2/2 impaired dorsiflexion & heel strike and cuing to back up to w/c. Pt propels w/c around unit with R hemi technique and supervision with occasional cuing for L attention. Pt negotiates 8 steps (3") with R rail and min assist with ability to advance LLE up step but min assist for balance and min assist initially for LLE placement when descending stairs but then pt able to complete without assistance. Pt requires min assist overall for balance & cuing for compensatory pattern. Educated pt on risks for another stroke following initial one. Pt ambulates in apartment with mod assist over carpeted surface and max cuing for stepping sequencing and advancement of LLE. Pt completes sit>supine with min assist for LLE onto bed, rolling L<>R with supervision, and R sidelying>sit with supervision & cuing for technique. Pt does require cuing to scoot to center of bed before attempting to roll onto her side as pt reports fear of falling off EOB. Pt continues to fatigue quickly requiring rest breaks. Pt completes stand pivot bed>w/c with mod assist for balance and max cuing  for stepping sequencing and weight shifting. At end of session pt returns to bed & is left with alarm set & call bell in reach.   Addendum: Focused on management of L hand orthosis throughout session with pt requiring ongoing cuing.   Therapy Documentation Precautions:  Precautions Precautions: Fall Precaution  Comments: L hemi, L inattention/field cut Restrictions Weight Bearing Restrictions: No   See Function Navigator for Current Functional Status.  Therapy/Group: Individual Therapy  [name] 08/09/2018, 11:05 AM

## 2018-08-10 ENCOUNTER — Inpatient Hospital Stay (HOSPITAL_COMMUNITY): Payer: Medicare Other | Admitting: Occupational Therapy

## 2018-08-10 ENCOUNTER — Inpatient Hospital Stay (HOSPITAL_COMMUNITY): Payer: Medicare Other | Admitting: Physical Therapy

## 2018-08-10 ENCOUNTER — Inpatient Hospital Stay (HOSPITAL_COMMUNITY): Payer: Medicare Other

## 2018-08-10 MED ORDER — TIZANIDINE HCL 2 MG PO TABS: 2.0000 mg | ORAL_TABLET | Freq: Every day | ORAL | Status: DC

## 2018-08-10 MED ORDER — BENAZEPRIL HCL 20 MG PO TABS
20.0000 mg | ORAL_TABLET | Freq: Two times a day (BID) | ORAL | Status: DC
  Administered 2018-08-10: 20 mg via ORAL
  Filled 2018-08-10 (×2): qty 1

## 2018-08-10 MED ORDER — TIZANIDINE HCL 2 MG PO TABS
2.0000 mg | ORAL_TABLET | Freq: Every day | ORAL | Status: DC
  Administered 2018-08-11: 2 mg via ORAL
  Filled 2018-08-10: qty 1

## 2018-08-10 NOTE — Plan of Care (Signed)
  Problem: Spiritual Needs Goal: Ability to function at adequate level Outcome: Progressing   Problem: Consults Goal: RH STROKE PATIENT EDUCATION Description See Patient Education module for education specifics  Outcome: Progressing   Problem: RH BOWEL ELIMINATION Goal: RH STG MANAGE BOWEL WITH ASSISTANCE Description STG Manage Bowel with min Assistance.  Outcome: Progressing Goal: RH STG MANAGE BOWEL W/MEDICATION W/ASSISTANCE Description STG Manage Bowel with Medication with min Assistance.  Outcome: Progressing   Problem: RH BLADDER ELIMINATION Goal: RH STG MANAGE BLADDER WITH ASSISTANCE Description STG Manage Bladder With min Assistance  Outcome: Progressing   Problem: RH SKIN INTEGRITY Goal: RH STG SKIN FREE OF INFECTION/BREAKDOWN Description Patients skin will remain free from further infection or breakdown with min assist.  Outcome: Progressing Goal: RH STG MAINTAIN SKIN INTEGRITY WITH ASSISTANCE Description STG Maintain Skin Integrity With min  Assistance.  Outcome: Progressing Goal: RH STG ABLE TO PERFORM INCISION/WOUND CARE W/ASSISTANCE Description STG Able To Perform Incision/Wound Care With min Assistance.  Outcome: Progressing   Problem: RH SAFETY Goal: RH STG ADHERE TO SAFETY PRECAUTIONS W/ASSISTANCE/DEVICE Description STG Adhere to Safety Precautions With min Assistance/Device.  Outcome: Progressing   Problem: RH PAIN MANAGEMENT Goal: RH STG PAIN MANAGED AT OR BELOW PT'S PAIN GOAL Description < 3  Outcome: Progressing   Problem: RH KNOWLEDGE DEFICIT Goal: RH STG INCREASE KNOWLEDGE OF HYPERTENSION Description Min assist  Outcome: Progressing Goal: RH STG INCREASE KNOWLEGDE OF HYPERLIPIDEMIA Description Min assist  Outcome: Progressing Goal: RH STG INCREASE KNOWLEDGE OF STROKE PROPHYLAXIS Description Min assist  Outcome: Progressing

## 2018-08-10 NOTE — Progress Notes (Signed)
Physical Therapy Session Note  Patient Details  Name: TALEA MANGES MRN: [id number] Date of Birth: [birth year]  Today's Date: 08/10/2018 PT Individual Time: 1415-1450 PT Individual Time Calculation (min): 35 min   Short Term Goals: Week 4:  PT Short Term Goal 1 (Week 4): STG = LTG due to estimated d/c date.   Skilled Therapeutic Interventions/Progress Updates:    Pt received in bed, denying c/o pain & agreeable to tx. Pt with thigh high ted hose & ace wraps on BLE & BP = 114/48 mmHg, HR = 57 bpm. Abdominal binder donned & pt transferred to sitting EOB & BP = 127/41 mmHg, HR = 59 bpm. Pt transferred to w/c via stand pivot with mod assist & cuing for technique & BP = 114/53 mmHg, HR = 62 bpm. Pt denied dizziness throughout session. Pt propelled w/c room<>gym with R hemi technique and supervision, weaving between cones with min cuing for obstacle avoidance to simulate small home environment. At end of session pt transferred w/c>recliner with mod assist stand pivot with max cuing for sequencing, technique, hand placement. Pt left sitting in recliner with alarm belt donned & all needs in reach.   Therapy Documentation Precautions:  Precautions Precautions: Fall Precaution Comments: L hemi, L inattention/field cut Restrictions Weight Bearing Restrictions: No   See Function Navigator for Current Functional Status.   Therapy/Group: Individual Therapy  [name] 08/10/2018, 4:06 PM

## 2018-08-10 NOTE — Progress Notes (Signed)
Occupational Therapy Session Note  Patient Details  Name: [name] MRN: [id number] Date of Birth: 1945-12-01  Today's Date: 08/10/2018 OT Individual Time: 1002-1100 OT Individual Time Calculation (min): 58 min    Short Term Goals: Week 3:  OT Short Term Goal 1 (Week 3): Pt will complete LB dressing with min assist at sit > stand level OT Short Term Goal 2 (Week 3): Pt will complete toilet transfers with min assist OT Short Term Goal 3 (Week 3): Pt will complete shower transfers with mod assist OT Short Term Goal 4 (Week 3): Pt will utilize LUE as gross assist with min cues  Skilled Therapeutic Interventions/Progress Updates:    Treatment session with focus on functional mobility and LUE NMR.  Pt received supine in bed reporting fatigue and describing low BP episodes during PT session.  Pt expressing desire to get OOB if able to this session.  Assessed vitals from supine to sitting and after upright activity (see flowsheets), thigh high teds, Ace wraps, and abdominal binder on throughout session.  Pt completed bed mobility with min assist with tactile cues for anterior weight shift.  Completed squat pivot transfer mod assist with improved weight shift.  Engaged in grooming tasks in sitting due to continued reports of fatigue.  Pt utilized LUE to turn on water and hold toothpaste, demonstrating increased functional use.  Returned to bed due to low BP and engaged in LUE NMR in supine with focus on shoulder flexion and elbow extension.  Utilized medium sized therapy ball with focus on symmetrical movements with overhead reaching.  Pt reports fatigue in LUE, but improved symmetry with movement as well as improved quality of movement.    Therapy Documentation Precautions:  Precautions Precautions: Fall Precaution Comments: L hemi, L inattention/field cut Restrictions Weight Bearing Restrictions: No General:   Vital Signs: Therapy Vitals Pulse Rate: (!) 57 BP: (!) 98/40 Patient  Position (if appropriate): Sitting Pain: Pain Assessment Pain Score: 0-No pain  See Function Navigator for Current Functional Status.   Therapy/Group: Individual Therapy  [name] 08/10/2018, 12:33 PM

## 2018-08-10 NOTE — Progress Notes (Signed)
Patient orthostatic again per nursing. Her SBP dropped to 90's  with PT this am after medications despite abdominal binder. Will change BP checks to QID--seated. Labs checked yesterday showed renal status stable. No leucocytosis or signs of infection. Will decrease Zanaflex to daily at bedtime. Encourage patient to increase fluid intake.   Keep Lotensin at 10 mg bid. Start checking orthostatic vitals again.

## 2018-08-10 NOTE — Progress Notes (Signed)
Speech Language Pathology Daily Session Note  Patient Details  Name: PANZY BUBECK MRN: [id number] Date of Birth: 05-Jan-1946  Today's Date: 08/10/2018 SLP Individual Time: 1224-8250 SLP Individual Time Calculation (min): 55 min  Short Term Goals: Week 4: SLP Short Term Goal 1 (Week 4): STG=LTG due to ELOS  Skilled Therapeutic Interventions:Skilled ST services focused on cognitive skills. SLP facilitated semi-complex problem solving, error awareness and recall skills utilizing medication manage task requiring supervision A verbal cues, money management task requiring min-supervision A verbal cues and novel care game played at most complex level min A verbal cues. Pt required supervision A verbal cues for redirection in a moderately distracting environment (tv with volume on.) Pt required supervision A verbal cues to scan to left visual field. Pt was left in room with call bell within reach and bed alaram set. ST reccomends to continue skilled ST services.     Function:  Eating Eating                 Cognition Comprehension Comprehension assist level: Understands complex 90% of the time/cues 10% of the time  Expression   Expression assist level: Expresses complex 90% of the time/cues < 10% of the time  Social Interaction Social Interaction assist level: Interacts appropriately with others with medication or extra time (anti-anxiety, antidepressant).  Problem Solving Problem solving assist level: Solves complex 90% of the time/cues < 10% of the time;Solves basic problems with no assist  Memory Memory assist level: Recognizes or recalls 90% of the time/requires cueing < 10% of the time;Recognizes or recalls 75 - 89% of the time/requires cueing 10 - 24% of the time    Pain Pain Assessment Pain Scale: 0-10 Pain Score: 5  Pain Type: Acute pain Pain Location: Head Pain Descriptors / Indicators: Aching Pain Frequency: Intermittent Pain Onset: Gradual Patients Stated Pain Goal:  3 Pain Intervention(s): Medication (See eMAR)  Therapy/Group: Individual Therapy  Antwine Agosto  [facility] 08/10/2018, 1:56 PM

## 2018-08-10 NOTE — Progress Notes (Signed)
Subjective/Complaints: Using Left hand to eat breakfast! ROS: Denies CP, SOB, N/V/D   Objective: Vital Signs: Blood pressure (!) 148/80, pulse 62, temperature 98 F (36.7 C), temperature source Oral, resp. rate 18, height '5\' 6"'  (1.676 m), weight 89 kg, SpO2 99 %. No results found. Results for orders placed or performed during the hospital encounter of 07/19/18 (from the past 72 hour(s))  Basic metabolic panel     Status: Abnormal   Collection Time: 08/09/18  7:03 AM  Result Value Ref Range   Sodium 139 135 - 145 mmol/L   Potassium 4.3 3.5 - 5.1 mmol/L   Chloride 104 98 - 111 mmol/L   CO2 25 22 - 32 mmol/L   Glucose, Bld 102 (H) 70 - 99 mg/dL   BUN 19 8 - 23 mg/dL   Creatinine, Ser 0.98 0.44 - 1.00 mg/dL   Calcium 10.3 8.9 - 10.3 mg/dL   GFR calc non Af Amer 57 (L) >60 mL/min   GFR calc Af Amer >60 >60 mL/min    Comment: (NOTE) The eGFR has been calculated using the CKD EPI equation. This calculation has not been validated in all clinical situations. eGFR's persistently <60 mL/min signify possible Chronic Kidney Disease.    Anion gap 10 5 - 15    Comment: Performed at [facility] [address], [place] [address]  CBC     Status: None   Collection Time: 08/09/18  7:03 AM  Result Value Ref Range   WBC 5.7 4.0 - 10.5 K/uL   RBC 4.56 3.87 - 5.11 MIL/uL   Hemoglobin 13.8 12.0 - 15.0 g/dL   HCT 40.8 36.0 - 46.0 %   MCV 89.5 78.0 - 100.0 fL   MCH 30.3 26.0 - 34.0 pg   MCHC 33.8 30.0 - 36.0 g/dL   RDW 12.4 11.5 - 15.5 %   Platelets 203 150 - 400 K/uL    Comment: Performed at [facility], [place] [address], [place], [place] [address]     Constitutional: No distress . Vital signs reviewed. HENT: Normocephalic.  Atraumatic. Eyes: EOMI. No discharge. Cardiovascular: RRR. No JVD. Respiratory: CTA bilaterally. Normal effort. GI: BS +. Non-distended. Musc:tender at anterior thigh but not medial or posterior thigh Skin:   Intact. Warm and dry. Neuro:   Motor: LUE 3 triceps and grip proximal distal, now with 3 finger ext Left lower extremity: Hip flexion, knee extension 3 -/5, ankle dorsiflexion 1/5, 2- ankle plantarflexion   Psych: Brighter affect  Assessment/Plan: 1. 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 Physiatrist is providing close team supervision and 24 hour management of active medical problems listed below. Physiatrist and rehab team continue to assess barriers to discharge/monitor patient progress toward functional and medical goals. FIM: Function - Bathing Position: Shower Body parts bathed by patient: Right arm, Left arm, Chest, Abdomen, Front perineal area, Buttocks, Right upper leg, Left upper leg, Right lower leg, Left lower leg Body parts bathed by helper: Back Bathing not applicable: Front perineal area, Buttocks Assist Level: Touching or steadying assistance(Pt > 75%)  Function- Upper Body Dressing/Undressing What is the patient wearing?: Pull over shirt/dress, Bra Bra - Perfomed by patient: Thread/unthread right bra strap, Thread/unthread left bra strap Bra - Perfomed by helper: Hook/unhook bra (pull down sports bra) Pull over shirt/dress - Perfomed by patient: Thread/unthread right sleeve, Put head through opening, Pull shirt  over trunk, Thread/unthread left sleeve Pull over shirt/dress - Perfomed by helper: Thread/unthread left sleeve Assist Level: Touching or steadying assistance(Pt > 75%) Function - Lower Body Dressing/Undressing What is the patient wearing?: Underwear, Pants, Shoes, Ted Hose Position: Wheelchair/chair at [employer] - Performed by patient: Thread/unthread right underwear leg, Thread/unthread left underwear leg Underwear - Performed by helper: Pull underwear up/down Pants- Performed by patient: Thread/unthread right pants leg, Thread/unthread  left pants leg Pants- Performed by helper: Pull pants up/down Non-skid slipper socks- Performed by helper: Don/doff right sock, Don/doff left sock Shoes - Performed by patient: Don/doff right shoe Shoes - Performed by helper: Don/doff left shoe TED Hose - Performed by helper: Don/doff right TED hose, Don/doff left TED hose Assist for footwear: Partial/moderate assist Assist for lower body dressing: Touching or steadying assistance (Pt > 75%)(Mod assist)  Function - Toileting Toileting steps completed by patient: Performs perineal hygiene Toileting steps completed by helper: Adjust clothing prior to toileting, Performs perineal hygiene, Adjust clothing after toileting Toileting Assistive Devices: Grab bar or rail Assist level: Touching or steadying assistance (Pt.75%)  Function - Air cabin crew transfer activity did not occur: Safety/medical concerns Toilet transfer assistive device: Elevated toilet seat/BSC over toilet, Grab bar, Drop arm commode, Mechanical lift Mechanical lift: Stedy Assist level to toilet: Moderate assist (Pt 50 - 74%/lift or lower) Assist level from toilet: Moderate assist (Pt 50 - 74%/lift or lower) Assist level to bedside commode (at bedside): 2 helpers Assist level from bedside commode (at bedside): 2 helpers  Function - Chair/bed transfer Chair/bed transfer method: Stand pivot Chair/bed transfer assist level: Moderate assist (Pt 50 - 74%/lift or lower) Chair/bed transfer assistive device: Armrests Mechanical lift: Stedy Chair/bed transfer details: Verbal cues for technique, Verbal cues for sequencing, Verbal cues for precautions/safety, Verbal cues for safe use of DME/AE, Manual facilitation for weight shifting, Manual facilitation for placement  Function - Locomotion: Wheelchair Will patient use wheelchair at discharge?: Yes Type: Manual Max wheelchair distance: 150 ft  Assist Level: Supervision or verbal cues Assist Level: Supervision or verbal  cues Wheel 150 feet activity did not occur: Safety/medical concerns Assist Level: Supervision or verbal cues Turns around,maneuvers to table,bed, and toilet,negotiates 3% grade,maneuvers on rugs and over doorsills: No Function - Locomotion: Ambulation Assistive device: Walker-rolling, Orthosis(L hand orthosis) Max distance: 15 ft  Assist level: Touching or steadying assistance (Pt > 75%) Assist level: Touching or steadying assistance (Pt > 75%) Walk 50 feet with 2 turns activity did not occur: Safety/medical concerns Walk 150 feet activity did not occur: Safety/medical concerns Walk 10 feet on uneven surfaces activity did not occur: Safety/medical concerns  Function - Comprehension Comprehension: Auditory Comprehension assist level: Understands complex 90% of the time/cues 10% of the time  Function - Expression Expression: Verbal Expression assist level: Expresses complex 90% of the time/cues < 10% of the time  Function - Social Interaction Social Interaction assist level: Interacts appropriately with others with medication or extra time (anti-anxiety, antidepressant).  Function - Problem Solving Problem solving assist level: Solves basic problems with no assist  Function - Memory Memory assist level: Recognizes or recalls 90% of the time/requires cueing < 10% of the time Patient normally able to recall (first 3 days only): Current season, That he or she is in a hospital, Location of own room, Staff names and faces  Medical Problem List and Plan: 1. Decline in functional status due to resultant left-sided weakness with sensory deficits and left homonymous  hemianopsia secondary to large Right occipitoparietal ICH  Cont CIR PT, OT SLP Improved left upper and lower extremity movement in all joints Team conf in am   .2.  DVT Prophylaxis/Anticoagulation: Mechanical: Sequential compression devices, below knee Bilateral lower extremities 3. Pain Management: Continue Tylenol as needed  for headaches, quad soreness left due to exercise no adductor or hamstring soreness 4. Mood: LCSW to follow for evaluation and support, neuropsych to follow , on Zoloft 50m Slept well 5. Neuropsych: This patient is capable of making decisions on her own behalf. 6. Skin/Wound Care: Routine pressure relief measures 7. Fluids/Electrolytes/Nutrition: Monitor I's and O's.  BMP wnl 9/23 8. HTN: Monitor blood pressure twice daily   Will tighten control increase Lotensin to 249mBID Vitals:   08/10/18 0521 08/10/18 0743  BP: (!) 151/57 (!) 148/80  Pulse: 60 62  Resp: 18   Temp: 98 F (36.7 C)   SpO2: 99%   9.  PAF: Monitor heart rate twice daily-- appears in NSR on Tambocor and metoprolol twice daily. Bradycardia improved initially with reduction of metoprolol to  12.76m26mID no dizziness  10. Constipation: MiraLAX today.  May need to increase senna to twice daily 11.  Urinary retention: Monitor voiding with PVR checks.    Starting to void!  Off  Urecholine continues to void  12.  Fall without  fx in wrist , hip, no SDH on CT head LOS (Days) 22 A FACE TO FACE EVALUATION WAS PERFORMED  AndCharlett Blake24/2019, 8:22 AM

## 2018-08-10 NOTE — Progress Notes (Addendum)
Physical Therapy Session Note  Patient Details  Name: [name] MRN: [id number] Date of Birth: August 28, 1946  Today's Date: 08/10/2018 PT Individual Time: 0809-0906 PT Individual Time Calculation (min): 57 min   Short Term Goals: Week 4:  PT Short Term Goal 1 (Week 4): STG = LTG due to estimated d/c date.   Skilled Therapeutic Interventions/Progress Updates:  Pt received in bed & agreeable to tx. During session pt reported L shoulder soreness after BP checked & therapist repositioned extremity for increased comfort. Therapist donned thigh high ted hose total assist for time management and pt transferred to sitting EOB with supervision and hospital bed features. Pt reports dizziness sitting EOB and abdominal binder donned. Assisted pt to w/c via squat pivot with mod assist for back support but pt reporting worsening dizziness so pt assisted back to bed & supine. Pt reports feeling better in supine but BP = 99/43 mmHg, HR = 58 bpm. After lying supine for a few minutes BP reassessed & was 96/43 mmHg, HR = 56 but pt declined feeling dizzy. RN notified of pt's low BP readings, pt's BLE wrapped with ace wraps for further BP management and after prolonged supine rest break BP = 97/41 mmHg, HR  57 bpm. Pt remained supine in bed & performed BLE bridging and LLE single leg bridging with cuing for technique and task focusing on strengthening & L NMR, as well as core control. While sitting in bed pt engaged in LUE task requiring her to complete shoulder abduction & external rotation as well as elbow extension to reach and obtain small objects on left to pick them up and place them in cup with task also focusing on fine motor control and grasping/releasing objects and L attention. At end of session BP = 88/43 mmHg, HR = 57 bpm & RN made aware. Pt left in bed with alarm set & all needs in reach.    Therapy Documentation Precautions:  Precautions Precautions: Fall Precaution Comments: L hemi, L  inattention/field cut Restrictions Weight Bearing Restrictions: No   Vital Signs: Pulse Rate: (!) 58 BP: (!) 99/43   See Function Navigator for Current Functional Status.   Therapy/Group: Individual Therapy  [name] 08/10/2018, 9:19 AM

## 2018-08-11 ENCOUNTER — Inpatient Hospital Stay (HOSPITAL_COMMUNITY): Payer: Medicare Other | Admitting: Physical Therapy

## 2018-08-11 ENCOUNTER — Inpatient Hospital Stay (HOSPITAL_COMMUNITY): Payer: Medicare Other

## 2018-08-11 ENCOUNTER — Encounter (HOSPITAL_COMMUNITY): Payer: Medicare Other | Admitting: *Deleted

## 2018-08-11 ENCOUNTER — Inpatient Hospital Stay (HOSPITAL_COMMUNITY): Payer: Medicare Other | Admitting: Occupational Therapy

## 2018-08-11 MED ORDER — BENAZEPRIL HCL 20 MG PO TABS
10.0000 mg | ORAL_TABLET | Freq: Two times a day (BID) | ORAL | Status: DC
  Administered 2018-08-11 – 2018-08-16 (×10): 10 mg via ORAL
  Filled 2018-08-11 (×10): qty 1

## 2018-08-11 NOTE — Progress Notes (Signed)
Subjective/Complaints: Orthostatic BP drop yesterday pm, received Lotensin 73m this am ROS: Denies CP, SOB, N/V/D   Objective: Vital Signs: Blood pressure (!) 121/58, pulse (!) 56, temperature 98 F (36.7 C), temperature source Oral, resp. rate 18, height 5' 6" (1.676 m), weight 89.1 kg, SpO2 97 %. No results found. Results for orders placed or performed during the hospital encounter of 07/19/18 (from the past 72 hour(s))  Basic metabolic panel     Status: Abnormal   Collection Time: 08/09/18  7:03 AM  Result Value Ref Range   Sodium 139 135 - 145 mmol/L   Potassium 4.3 3.5 - 5.1 mmol/L   Chloride 104 98 - 111 mmol/L   CO2 25 22 - 32 mmol/L   Glucose, Bld 102 (H) 70 - 99 mg/dL   BUN 19 8 - 23 mg/dL   Creatinine, Ser 0.98 0.44 - 1.00 mg/dL   Calcium 10.3 8.9 - 10.3 mg/dL   GFR calc non Af Amer 57 (L) >60 mL/min   GFR calc Af Amer >60 >60 mL/min    Comment: (NOTE) The eGFR has been calculated using the CKD EPI equation. This calculation has not been validated in all clinical situations. eGFR's persistently <60 mL/min signify possible Chronic Kidney Disease.    Anion gap 10 5 - 15    Comment: [facility], [id number] CBC     Status: None   Collection Time: 08/09/18  7:03 AM  Result Value Ref Range   WBC 5.7 4.0 - 10.5 K/uL   RBC 4.56 3.87 - 5.11 MIL/uL   Hemoglobin 13.8 12.0 - 15.0 g/dL   HCT 40.8 36.0 - 46.0 %   MCV 89.5 78.0 - 100.0 fL   MCH 30.3 26.0 - 34.0 pg   MCHC 33.8 30.0 - 36.0 g/dL   RDW 12.4 11.5 - 15.5 %   Platelets 203 150 - 400 K/uL    Comment: [facility] [address], [place] [address]    Constitutional: No distress . Vital signs reviewed. HENT: Normocephalic.  Atraumatic. Eyes: EOMI. No discharge. Cardiovascular: RRR. No JVD. Respiratory: CTA bilaterally. Normal effort. GI: BS +. Non-distended. Musc:tender at anterior thigh but not medial or posterior thigh Skin:    Intact. Warm and dry. Neuro:  Motor: LUE 3 triceps and grip proximal distal, now with 3 finger ext Left lower extremity: Hip flexion, knee extension 3 -/5, ankle dorsiflexion 1/5, 2- ankle plantarflexion   Psych: Brighter affect  Assessment/Plan: 1. 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 Physiatrist is providing close team supervision and 24 hour management of active medical problems listed below. Physiatrist and rehab team continue to assess barriers to discharge/monitor patient progress toward functional and medical goals. FIM: Function - Bathing Position: Shower Body parts bathed by patient: Right arm, Left arm, Chest, Abdomen, Front perineal area, Buttocks, Right upper leg, Left upper leg, Right lower leg, Left lower leg Body parts bathed by helper: Back Bathing not applicable: Front perineal area, Buttocks Assist Level: Touching or steadying assistance(Pt > 75%)  Function- Upper Body Dressing/Undressing What is the patient wearing?: Pull over shirt/dress, Bra Bra - Perfomed by patient: Thread/unthread right bra strap, Thread/unthread left bra strap Bra - Perfomed by helper: Hook/unhook bra (pull down sports bra) Pull over shirt/dress - Perfomed by patient: Thread/unthread right sleeve, Put  head through opening, Pull shirt over trunk, Thread/unthread left sleeve Pull over shirt/dress - Perfomed by helper: Thread/unthread left sleeve Assist Level: Touching or steadying assistance(Pt > 75%) Function - Lower Body Dressing/Undressing What is the patient wearing?: Underwear, Pants, Shoes, Ted Hose Position: Wheelchair/chair at [employer] - Performed by patient: Thread/unthread right underwear leg, Thread/unthread left underwear leg Underwear - Performed by helper: Pull underwear up/down Pants- Performed by patient: Thread/unthread  right pants leg, Thread/unthread left pants leg Pants- Performed by helper: Pull pants up/down Non-skid slipper socks- Performed by helper: Don/doff right sock, Don/doff left sock Shoes - Performed by patient: Don/doff right shoe Shoes - Performed by helper: Don/doff left shoe TED Hose - Performed by helper: Don/doff right TED hose, Don/doff left TED hose Assist for footwear: Partial/moderate assist Assist for lower body dressing: Touching or steadying assistance (Pt > 75%)(Mod assist)  Function - Toileting Toileting steps completed by patient: Performs perineal hygiene Toileting steps completed by helper: Adjust clothing prior to toileting, Performs perineal hygiene, Adjust clothing after toileting Toileting Assistive Devices: Grab bar or rail Assist level: Touching or steadying assistance (Pt.75%)  Function - Air cabin crew transfer activity did not occur: Safety/medical concerns Toilet transfer assistive device: Elevated toilet seat/BSC over toilet, Grab bar, Drop arm commode, Mechanical lift Mechanical lift: Stedy Assist level to toilet: Moderate assist (Pt 50 - 74%/lift or lower) Assist level from toilet: Moderate assist (Pt 50 - 74%/lift or lower) Assist level to bedside commode (at bedside): 2 helpers Assist level from bedside commode (at bedside): 2 helpers  Function - Chair/bed transfer Chair/bed transfer method: Squat pivot Chair/bed transfer assist level: Moderate assist (Pt 50 - 74%/lift or lower) Chair/bed transfer assistive device: Armrests Mechanical lift: Stedy Chair/bed transfer details: Verbal cues for technique, Verbal cues for safe use of DME/AE, Manual facilitation for weight shifting, Manual facilitation for placement  Function - Locomotion: Wheelchair Will patient use wheelchair at discharge?: Yes Type: Manual Max wheelchair distance: 150 ft  Assist Level: Supervision or verbal cues Assist Level: Supervision or verbal cues Wheel 150 feet activity  did not occur: Safety/medical concerns Assist Level: Supervision or verbal cues Turns around,maneuvers to table,bed, and toilet,negotiates 3% grade,maneuvers on rugs and over doorsills: No Function - Locomotion: Ambulation Assistive device: Walker-rolling, Orthosis(L hand orthosis) Max distance: 15 ft  Assist level: Touching or steadying assistance (Pt > 75%) Assist level: Touching or steadying assistance (Pt > 75%) Walk 50 feet with 2 turns activity did not occur: Safety/medical concerns Walk 150 feet activity did not occur: Safety/medical concerns Walk 10 feet on uneven surfaces activity did not occur: Safety/medical concerns  Function - Comprehension Comprehension: Auditory Comprehension assist level: Understands complex 90% of the time/cues 10% of the time  Function - Expression Expression: Verbal Expression assist level: Expresses complex 90% of the time/cues < 10% of the time  Function - Social Interaction Social Interaction assist level: Interacts appropriately with others with medication or extra time (anti-anxiety, antidepressant).  Function - Problem Solving Problem solving assist level: Solves complex 90% of the time/cues < 10% of the time, Solves basic problems with no assist  Function - Memory Memory assist level: Recognizes or recalls 90% of the time/requires cueing < 10% of the time, Recognizes or recalls 75 - 89% of the time/requires cueing 10 - 24% of the time Patient normally able to recall (first 3 days only): Current season, That he or she is in a hospital, Location of own room, Staff names and faces  Medical Problem List and Plan:  1. Decline in functional status due to resultant left-sided weakness with sensory deficits and left homonymous  hemianopsia secondary to large Right occipitoparietal ICH  Cont CIR PT, OT SLP Team conference today please see physician documentation under team conference tab, met with team face-to-face to discuss problems,progress, and  goals. Formulized individual treatment plan based on medical history, underlying problem and comorbidities. Team conference today please see physician documentation under team conference tab, met with team face-to-face to discuss problems,progress, and goals. Formulized individual treatment plan based on medical history, underlying problem and comorbidities.   .2.  DVT Prophylaxis/Anticoagulation: Mechanical: Sequential compression devices, below knee Bilateral lower extremities 3. Pain Management: Continue Tylenol as needed for headaches, quad soreness left due to exercise no adductor or hamstring soreness 4. Mood: LCSW to follow for evaluation and support, neuropsych to follow , on Zoloft 24m Slept well 5. Neuropsych: This patient is capable of making decisions on her own behalf. 6. Skin/Wound Care: Routine pressure relief measures 7. Fluids/Electrolytes/Nutrition: Monitor I's and O's.  BMP wnl 9/23 8. HTN: Monitor blood pressure twice daily   Resting BP controlled but unable to tolerate higher dose lotensin due to orthostatic hypotension Vitals:   08/10/18 1921 08/11/18 0500  BP: (!) 144/47 (!) 121/58  Pulse: 65 (!) 56  Resp: 15 18  Temp: 97.8 F (36.6 C) 98 F (36.7 C)  SpO2: 99% 97%  9.  PAF: Monitor heart rate twice daily-- appears in NSR on Tambocor and metoprolol twice daily. Bradycardia improved initially with reduction of metoprolol to  12.51mBID no dizziness  10. Constipation: senna 2 po qhs 11.  Urinary retention: Monitor voiding with PVR checks.    Voiding without cholinergic meds      LOS (Days) 23 A FACE TO FACE EVALUATION WAS PERFORMED  AnCharlett Blake/25/2019, 7:51 AM

## 2018-08-11 NOTE — Progress Notes (Addendum)
Physical Therapy Session Note  Patient Details  Name: [name] MRN: [id number] Date of Birth: Aug 30, 1946  Today's Date: 08/11/2018 PT Individual Time: 1655-3748 PT Individual Time Calculation (min): 70 min   Short Term Goals: Week 4:  PT Short Term Goal 1 (Week 4): STG = LTG due to estimated d/c date.   Skilled Therapeutic Interventions/Progress Updates:  Pt received in bed & agreeable to tx, denying c/o pain. Therapist donned thigh high ted hose, ace wraps & abdominal binder total assist for BP management as pt reports she received BP medication already this AM. Pt transferred to EOB with hospital bed features & supervision and consumed breakfast sitting EOB. Pt was able to stabilize items (container of syrup, utensil) with LUE while opening or cutting food with RUE. Sitting EOB vitals: BP = 152/68 mmHg, HR = 66 bpm. Pt completes stand pivot bed>w/c with mod assist and cuing for sequencing steps. Pt propels w/c room>gym with R hemi technique & distant supervision. Gait x 40 ft with RW & L hand orthosis with min/mod assist with therapist providing manual facilitation for weight shifting L and cuing for increased foot clearance LLE. Pt clears LLE with increased hip flexion as her AROM in L ankle is limited and pt has no heel strike and minimal dorsiflexion during swing phase. Therapist donned L PLS AFO and pt ambulated additional 40 ft with ongoing manual facilitation and cuing for L foot clearance. Educated pt on benefits of AFO & toe cap and pt agreeable; will try to schedule orthotics consult tomorrow. Pt does require significant cuing & assistance to sequence turning then safely sit in w/c seat 2/2 LOB & poor eccentric control. While sitting EOM pt engaged in sit>stand transfers with task focusing in LLE NMR & strengthening via forced use, equal weight bearing through BLE, upright posture, standing balance, and eccentric control with stand>sit descent. Pt requires max assist for task. At  end of session pt assisted back to recliner via stand pivot with mod assist. Pt left in recliner with BLE elevated, chair alarm donned & call bell in reach.  Therapy Documentation Precautions:  Precautions Precautions: Fall Precaution Comments: L hemi, L inattention/field cut Restrictions Weight Bearing Restrictions: No   See Function Navigator for Current Functional Status.   Therapy/Group: Individual Therapy  [name] 08/11/2018, 9:20 AM

## 2018-08-11 NOTE — Progress Notes (Signed)
Recreational Therapy Session Note  Patient Details  Name: [name] MRN: [id number] Date of Birth: Jan 28, 1946 Today's Date: 08/11/2018 Time:  1315-1400 Pain:no c/o Skilled Therapeutic Interventions/Progress Updates: Pt referred for participation in a kitchen safety/meal prep group.   Group goals:  >Pt will actively participate in 45 minute kitchen task seated w/c level with supervision. >After discussion, pt will identify opportunities for energy conservation during simple      meal prep tasks with min cues.   Pt participated in kitchen safety/meal prep group at overall supervision w/c level for 45 minutes. Pt sat w/c level using BUEs to mix and roll sausage balls.  Education provided on energy conservation techniques and kitchen safety/home modifications.  Goals met.   Therapy/Group: Group Jabez Molner 08/11/2018, 3:47 PM

## 2018-08-11 NOTE — Progress Notes (Signed)
Speech Language Pathology Daily Session Note  Patient Details  Name: JILLAINE WAREN MRN: [id number] Date of Birth: Jun 07, 1946  Today's Date: 08/11/2018 SLP Individual Time: 1103-1200 215-315 SLP Individual Time Calculation (min): 57 min and 60  Short Term Goals: Week 4: SLP Short Term Goal 1 (Week 4): STG=LTG due to ELOS  Skilled Therapeutic Interventions:  1# Skilled ST services focused on cognitive skills. SLP facilitated left scanning, semi-complex and complex problem solving and error awareness skills utilizing scheduling tasks, pt required min A cues for complex problem solving and supervision A verbal cues for semi-complex problem solving, error awareness and left scaning. SLP facilitated anticipatory awareness skills, given leading questions pt listed 3 activities that were safe and unsafe upon discharge to SNF given supervision A verbal cues. Pt was left in room with call bell within reach and bed alaram set. ST reccomends to continue skilled ST services.  2# Skilled ST services focused on cognitive skills. SLP facilitated familiar card task focusing on left scanning, semi-complex problem solving and error awareness, pt required min-supervision A verbal cues. Pt was left in room with call bell within reach and bed alaram set.ST reccomends to continue skilled ST services.       Function:  Eating Eating                 Cognition Comprehension Comprehension assist level: Understands complex 90% of the time/cues 10% of the time  Expression   Expression assist level: Expresses complex 90% of the time/cues < 10% of the time  Social Interaction Social Interaction assist level: Interacts appropriately with others with medication or extra time (anti-anxiety, antidepressant).  Problem Solving Problem solving assist level: Solves complex 90% of the time/cues < 10% of the time;Solves basic problems with no assist  Memory Memory assist level: Recognizes or recalls 90% of the  time/requires cueing < 10% of the time;Recognizes or recalls 75 - 89% of the time/requires cueing 10 - 24% of the time    Pain Pain Assessment Pain Score: 0-No pain  Therapy/Group: Individual Therapy  Hall Birchard  [facility] 08/11/2018, 3:03 PM

## 2018-08-11 NOTE — Progress Notes (Signed)
Occupational Therapy Session Note  Patient Details  Name: [name] MRN: [id number] Date of Birth: January 19, 1946  Today's Date: 08/11/2018 OT Group Time: 1300-1400 OT Group Time Calculation (min): 60 min    Short Term Goals: Week 3:  OT Short Term Goal 1 (Week 3): Pt will complete LB dressing with min assist at sit > stand level OT Short Term Goal 2 (Week 3): Pt will complete toilet transfers with min assist OT Short Term Goal 3 (Week 3): Pt will complete shower transfers with mod assist OT Short Term Goal 4 (Week 3): Pt will utilize LUE as gross assist with min cues  Skilled Therapeutic Interventions/Progress Updates:    Pt seen for group session focusing on functional use of LUE during cooking task in ADL kitchen. Pt performed hand washing in standing at sink requiring min assist for sit > stand. Pt performed meal prep tasks such as pouring items into bowl, mixing/kneading ingredients with use of BUE fading to mostly RUE while stabilizing bowl with LUE, and using rolling motion to form sausage balls. Therapist providing cues for increased symmetrical movements during rolling motion to increase functional use of LUE.  Pt required minimal cues throughout for using LUE during task with noted improvements due to continuation of use. Pt washed dishes in standing with focus on reaching with LUE to obtain items and then utilizing LUE to stabilize items while washing with dominant Rt hand.  Pt returned to room at end of session and left seated upright in w/c with nurse tech present to assess vitals.    Therapy Documentation Precautions:  Precautions Precautions: Fall Precaution Comments: L hemi, L inattention/field cut Restrictions Weight Bearing Restrictions: No General:   Vital Signs: Therapy Vitals Temp: 98.2 F (36.8 C) Pulse Rate: 74 Resp: 17 BP: (!) 145/60 Patient Position (if appropriate): Sitting Oxygen Therapy SpO2: 96 % O2 Device: Room Air Pain:  Pt with no c/o  pain  See Function Navigator for Current Functional Status.   Therapy/Group: Group Therapy  [name] 08/11/2018, 7:59 PM

## 2018-08-11 NOTE — Patient Care Conference (Signed)
Inpatient RehabilitationTeam Conference and Plan of Care Update Date: 08/11/2018   Time: 10:40 Am    Patient Name: [name]      Medical Record Number: [id number]  Date of Birth: [birth year] Sex: Female         Room/Bed: 4W09C/4W09C-01 Payor Info: Payor: MEDICARE / Plan: MEDICARE PART A AND B / Product Type: *No Product type* /    Admitting Diagnosis: iNTRACRANIAL HEMMORHAGE  Admit Date/Time:  07/19/2018  1:40 PM Admission Comments: No comment available   Primary Diagnosis:  <principal problem not specified> Principal Problem: <principal problem not specified>  Patient Active Problem List   Diagnosis Date Noted  . Spastic hemiparesis ([place])   . Current mild episode of major depressive disorder ([place])   . Intracerebral hemorrhage ([place]) 07/19/2018  . Acute CVA (cerebrovascular accident) ([place])   . Benign essential HTN   . Urinary retention   . Constipation   . ICH (intracerebral hemorrhage) ([place]) 07/13/2018  . Hypertension 07/06/2012  . Paroxysmal atrial fibrillation ([place]) 11/27/2011  . DIARRHEA, ACUTE 05/31/2009  . GERD 03/06/2009  . BACK PAIN 03/06/2009  . ANXIETY 06/12/2007  . DEPRESSION 06/12/2007  . HEADACHE 06/12/2007    Expected Discharge Date: Expected Discharge Date: 08/11/18  Team Members Present: Physician leading conference: Dr. Alysia Penna Social Worker Present: Ovidio Kin, LCSW Nurse Present: Frances Maywood, RN PT Present: Lavone Nian, PT OT Present: [name], OT SLP Present: Charolett Bumpers, SLP PPS Coordinator present : Daiva Nakayama, RN, CRRN     Current Status/Progress Goal Weekly Team Focus  Medical   Mod A transfers minA/mod A amb needs AFO  long term return to home  Orthotic eval   Bowel/Bladder        cont B & B     Swallow/Nutrition/ Hydration             ADL's   min assist bathing and dressing, mod assist stand or squat pivot transfers.  Pt is utilizing LUE as gross assist during self-care tasks, with focus on increased  extension  min assist, downgraded transfers to mod assist  ADL retraining, dynamic sitting and standing balance, transfers, LUE NMR   Mobility   ambulates short distances with RW & L Hand orthosis with min/mod assist, mod assist transfers, stair negotiation with min assist & rails, supervision w/c mobility, supervision<>min assist bed mobility  supervision w/c mobility, min<>mod assist transfers, mod assist stair negotiation for home entry, min assist gait  gait, transfers, stair negotiation, bed mobility, L NMR, L attention, endurance, strengthening, w/c mobility   Communication             Safety/Cognition/ Behavioral Observations  Min-Supervision  supervision for semi-complex - downgraded 9/18  semi-complex problem solving, anticipatory awareness, left inattention, education   Pain        headaches managed with tylenol     Skin        monitor skin no issues        *See Care Plan and progress notes for long and short-term goals.     Barriers to Discharge  Current Status/Progress Possible Resolutions Date Resolved   Physician    Decreased caregiver support     progressing well  Cont rehab, SNF placment      Nursing                  PT  Lack of/limited family support;Decreased caregiver support  unsure if family can provide 24 hr assist at d/c  OT                  SLP                SW                Discharge Planning/Teaching Needs:  Family feeling needs more rehab before going home. Have begun NH bed search. All in agreement with this plan.      Team Discussion:  Making good progress in therapies, getting close to min assist level-mod with ambulation. Leg pain being treated with k-pad. AFO and shoe cap referral made. Awareness and inattention better. Urinary retention resolved. Adjusting BP meds. Return in left arm. Ready to start looking for NH bed.   Revisions to Treatment Plan:  Start looking for NH bed    Continued Need for Acute Rehabilitation Level of  Care: The patient requires daily medical management by a physician with specialized training in physical medicine and rehabilitation for the following conditions: Daily direction of a multidisciplinary physical rehabilitation program to ensure safe treatment while eliciting the highest outcome that is of practical value to the patient.: Yes Daily medical management of patient stability for increased activity during participation in an intensive rehabilitation regime.: Yes Daily analysis of laboratory values and/or radiology reports with any subsequent need for medication adjustment of medical intervention for : Neurological problems;Blood pressure problems   I attest that I was present, lead the team conference, and concur with the assessment and plan of the team.   Elease Hashimoto 08/11/2018, 2:41 PM

## 2018-08-12 ENCOUNTER — Inpatient Hospital Stay (HOSPITAL_COMMUNITY): Payer: Medicare Other | Admitting: Occupational Therapy

## 2018-08-12 ENCOUNTER — Inpatient Hospital Stay (HOSPITAL_COMMUNITY): Payer: Medicare Other | Admitting: Physical Therapy

## 2018-08-12 ENCOUNTER — Inpatient Hospital Stay (HOSPITAL_COMMUNITY): Payer: Medicare Other | Admitting: Speech Pathology

## 2018-08-12 NOTE — Progress Notes (Signed)
Occupational Therapy Session Note  Patient Details  Name: [name] MRN: [id number] Date of Birth: 06-16-1946  Today's Date: 08/12/2018 OT Individual Time: 1420-1445 OT Individual Time Calculation (min): 25 min    Short Term Goals: Week 4:  OT Short Term Goal 1 (Week 4): STG = LTGs due to remaining LOS  Skilled Therapeutic Interventions/Progress Updates:    Treatment session with focus on LUE NMR in supine.  Pt received seated upright in w/c with no c/o pain.  Completed stand pivot transfer min assist with pt demonstrating increased ability to advance LLE during transfer.  Engaged in [place] in supine with focus on stable mobility to scapula.  Utilized medium sized therapy ball in hands to promote symmetrical movements while focusing on shoulder flexion and elbow extension.  Pt demonstrating difficulty maintaining positioning of LUE, therefore transitioned to holding medium sized box lid for stable boarders to allow for increased stability of hand.  Therapist providing min cues to visually attend to LUE during activity to further promote functional return.  Returned to room and completed transfer to bed min assist stand pivot.  Therapy Documentation Precautions:  Precautions Precautions: Fall Precaution Comments: L hemi, L inattention/field cut Restrictions Weight Bearing Restrictions: No General:   Vital Signs: Therapy Vitals Temp: 97.6 F (36.4 C) Temp Source: Oral Pulse Rate: 65 Resp: 16 BP: (!) 142/69 Patient Position (if appropriate): Sitting Oxygen Therapy SpO2: 97 % O2 Device: Room Air Pain: Pain Assessment Pain Scale: 0-10 Pain Score: 0    See Function Navigator for Current Functional Status.   Therapy/Group: Individual Therapy  [name] 08/12/2018, 3:51 PM

## 2018-08-12 NOTE — Progress Notes (Signed)
Subjective/Complaints: Discussed BP and care team results ROS: Denies CP, SOB, N/V/D   Objective: Vital Signs: Blood pressure (!) 150/52, pulse (!) 57, temperature 98.6 F (37 C), resp. rate 16, height 5\' 6"  (1.676 m), weight 89.1 kg, SpO2 100 %. No results found. No results found for this or any previous visit (from the past 72 hour(s)).   Constitutional: No distress . Vital signs reviewed. HENT: Normocephalic.  Atraumatic. Eyes: EOMI. No discharge. Cardiovascular: RRR. No JVD. Respiratory: CTA bilaterally. Normal effort. GI: BS +. Non-distended. Musc:tender at anterior thigh but not medial or posterior thigh Skin:   Intact. Warm and dry. Neuro:  Motor: LUE 3 triceps and grip proximal distal, now with 3 finger ext Left lower extremity: Hip flexion, knee extension 3 -/5, ankle dorsiflexion 1/5, 2- ankle plantarflexion   Psych: Brighter affect  Assessment/Plan: 1. 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 Physiatrist is providing close team supervision and 24 hour management of active medical problems listed below. Physiatrist and rehab team continue to assess barriers to discharge/monitor patient progress toward functional and medical goals. FIM: Function - Bathing Position: Shower Body parts bathed by patient: Right arm, Left arm, Chest, Abdomen, Front perineal area, Buttocks, Right upper leg, Left upper leg, Right lower leg, Left lower leg Body parts bathed by helper: Back Bathing not applicable: Front perineal area, Buttocks Assist Level: Touching or steadying assistance(Pt > 75%)  Function- Upper Body Dressing/Undressing What is the patient wearing?: Pull over shirt/dress, Bra Bra - Perfomed by patient: Thread/unthread right bra strap, Thread/unthread left bra strap Bra - Perfomed by helper: Hook/unhook bra (pull down sports  bra) Pull over shirt/dress - Perfomed by patient: Thread/unthread right sleeve, Put head through opening, Pull shirt over trunk, Thread/unthread left sleeve Pull over shirt/dress - Perfomed by helper: Thread/unthread left sleeve Assist Level: Touching or steadying assistance(Pt > 75%) Function - Lower Body Dressing/Undressing What is the patient wearing?: Underwear, Pants, Shoes, Ted Hose Position: Wheelchair/chair at [employer] - Performed by patient: Thread/unthread right underwear leg, Thread/unthread left underwear leg Underwear - Performed by helper: Pull underwear up/down Pants- Performed by patient: Thread/unthread right pants leg, Thread/unthread left pants leg Pants- Performed by helper: Pull pants up/down Non-skid slipper socks- Performed by helper: Don/doff right sock, Don/doff left sock Shoes - Performed by patient: Don/doff right shoe Shoes - Performed by helper: Don/doff left shoe TED Hose - Performed by helper: Don/doff right TED hose, Don/doff left TED hose Assist for footwear: Partial/moderate assist Assist for lower body dressing: Touching or steadying assistance (Pt > 75%)(Mod assist)  Function - Toileting Toileting steps completed by patient: Performs perineal hygiene Toileting steps completed by helper: Adjust clothing prior to toileting, Performs perineal hygiene, Adjust clothing after toileting Toileting Assistive Devices: Grab bar or rail Assist level: Touching or steadying assistance (Pt.75%)  Function - Air cabin crew transfer activity did not occur: Safety/medical concerns Toilet transfer assistive device: Elevated toilet seat/BSC over toilet, Grab bar, Drop arm commode, Mechanical lift Mechanical lift: Stedy Assist level to toilet: Moderate assist (Pt 50 - 74%/lift or lower) Assist level from toilet: Moderate assist (Pt 50 - 74%/lift or lower) Assist level to bedside commode (at bedside): 2 helpers Assist level from bedside commode (at bedside):  2 helpers  Function - Chair/bed transfer Chair/bed transfer method: Stand pivot, Squat pivot Chair/bed transfer assist level: Moderate assist (Pt 50 - 74%/lift or lower) Chair/bed transfer assistive  device: Armrests Mechanical lift: Stedy Chair/bed transfer details: Verbal cues for technique, Verbal cues for safe use of DME/AE, Manual facilitation for weight shifting, Manual facilitation for placement  Function - Locomotion: Wheelchair Will patient use wheelchair at discharge?: Yes Type: Manual Max wheelchair distance: 100 ft  Assist Level: Supervision or verbal cues Assist Level: Supervision or verbal cues Wheel 150 feet activity did not occur: Safety/medical concerns Assist Level: Supervision or verbal cues Turns around,maneuvers to table,bed, and toilet,negotiates 3% grade,maneuvers on rugs and over doorsills: No Function - Locomotion: Ambulation Assistive device: Walker-rolling(L hand orthosis, L AFO) Max distance: 40 ft  Assist level: Moderate assist (Pt 50 - 74%) Assist level: Moderate assist (Pt 50 - 74%) Walk 50 feet with 2 turns activity did not occur: Safety/medical concerns Walk 150 feet activity did not occur: Safety/medical concerns Walk 10 feet on uneven surfaces activity did not occur: Safety/medical concerns  Function - Comprehension Comprehension: Auditory Comprehension assist level: Understands complex 90% of the time/cues 10% of the time  Function - Expression Expression: Verbal Expression assist level: Expresses complex 90% of the time/cues < 10% of the time  Function - Social Interaction Social Interaction assist level: Interacts appropriately with others with medication or extra time (anti-anxiety, antidepressant).  Function - Problem Solving Problem solving assist level: Solves complex 90% of the time/cues < 10% of the time, Solves basic problems with no assist  Function - Memory Memory assist level: Recognizes or recalls 90% of the time/requires  cueing < 10% of the time, Recognizes or recalls 75 - 89% of the time/requires cueing 10 - 24% of the time Patient normally able to recall (first 3 days only): Current season, That he or she is in a hospital, Location of own room, Staff names and faces  Medical Problem List and Plan: 1. Decline in functional status due to resultant left-sided weakness with sensory deficits and left homonymous  hemianopsia secondary to large Right occipitoparietal ICH  Cont CIR PT, OT SLPhas made excellent progress but transfers still Mod A will need SNF since pt only has int sup at home  .2.  DVT Prophylaxis/Anticoagulation: Mechanical: Sequential compression devices, below knee Bilateral lower extremities 3. Pain Management: Continue Tylenol as needed for headaches, quad soreness left due to exercise no adductor or hamstring soreness 4. Mood: LCSW to follow for evaluation and support, neuropsych to follow , on Zoloft 50mg  Slept well 5. Neuropsych: This patient is capable of making decisions on her own behalf. 6. Skin/Wound Care: Routine pressure relief measures 7. Fluids/Electrolytes/Nutrition: Monitor I's and O's.  BMP wnl 9/23 8. HTN: Monitor blood pressure twice daily   Resting sys BP needs to run ~135-155 to avoid symptomatic orthostasis Vitals:   08/11/18 1938 08/12/18 0402  BP: (!) 145/60 (!) 150/52  Pulse: 74 (!) 57  Resp: 17 16  Temp: 98.2 F (36.8 C) 98.6 F (37 C)  SpO2: 96% 100%  9.  PAF: Monitor heart rate twice daily-- appears in NSR on Tambocor and metoprolol twice daily. Bradycardia improved initially with reduction of metoprolol to  12.5mg  BID no dizziness  10. Constipation: senna 2 po qhs 11.  Urinary retention: resolved    12.  Spasticity improved will stop tizanidine qhs and monitor    LOS (Days) 24 A FACE TO FACE EVALUATION WAS PERFORMED  Charlett Blake 08/12/2018, 7:12 AM

## 2018-08-12 NOTE — Progress Notes (Signed)
Recreational Therapy Discharge Summary Patient Details  Name: [name] MRN: [id number] Date of Birth: [birth year] Today's Date: 08/12/2018  Comments on progress toward goals: Pt has made good progress toward goals and is ready for discharge to SNF for continued therapies and 24 hour supervision/assistance. Pt is discharging at an overall Min assist level. TR sessions focused on leisure assessment, activity analysis with potential modifications, stress management, energy conservation & kitchen safety/meal prep.  All goals met. Reasons for discharge: discharge from hospital  Patient/family agrees with progress made and goals achieved: Yes  [name] 08/12/2018, 10:13 AM

## 2018-08-12 NOTE — Progress Notes (Signed)
Physical Therapy Session Note  Patient Details  Name: [name] MRN: [id number] Date of Birth: May 06, 1946  Today's Date: 08/12/2018 PT Individual Time: 1300-1400 PT Individual Time Calculation (min): 60 min   Short Term Goals: Week 3:  PT Short Term Goal 1 (Week 3): Pt will ambulate 30 ft with LRAD & max assist +1. PT Short Term Goal 1 - Progress (Week 3): Met PT Short Term Goal 2 (Week 3): Pt will complete stand pivot transfer bed<>w/c with LRAD. PT Short Term Goal 2 - Progress (Week 3): Met PT Short Term Goal 3 (Week 3): Pt will initiate stair negotiation for strengthening.  PT Short Term Goal 3 - Progress (Week 3): Met PT Short Term Goal 4 (Week 3): Pt will complete bed mobility consistently with mod assist +1. PT Short Term Goal 4 - Progress (Week 3): Met  Skilled Therapeutic Interventions/Progress Updates:    Pt received supine in bed, agreeable to PT. Pt reports 5/10 headache, nurse provides pain meds at end of therapy session. Pt appears emotionally labile this PM and reports feeling down today as it is her anniversary. Provided emotional support to patient. Bed mobility Supervision. SPT bed to w/c with mod A. Toilet transfer with mod A. Pt is dependent for clothing management and setup assist for pericare. Sit to stand with min A to RW. Orthotist here for AFO assessment. Trial amb with no AFO and RW with L hand orthosis x 40 ft, mod A for weight shift to advance LLE due to decrease hip flexor strength and toe-catching. Trial amb with flexible L PLS AFO with RW and L hand orthosis x 40 ft with mod A. Pt exhibits improved LLE clearance with use of AFO. Education with patient about proper footwear for use with AFO as the shoes her family provided are not big enough to fit AFO. Assisted pt with writing out list of things to look for in a shoe so her family can go to the store tonight to help find her some. Pt left seated in w/c in room with needs in reach, quick release belt with  chair alarm in place, L lap tray in place, in care of RN.  Therapy Documentation Precautions:  Precautions Precautions: Fall Precaution Comments: L hemi, L inattention/field cut Restrictions Weight Bearing Restrictions: No  See Function Navigator for Current Functional Status.   Therapy/Group: Individual Therapy  Excell Seltzer, PT, DPT  08/12/2018, 4:02 PM

## 2018-08-12 NOTE — Progress Notes (Signed)
Occupational Therapy Weekly Progress Note  Patient Details  Name: [name] MRN: [id number] Date of Birth: November 16, 1946  Beginning of progress report period: August 04, 2018 End of progress report period: August 12, 2018  Today's Date: 08/12/2018 OT Individual Time: 1010-1105 OT Individual Time Calculation (min): 55 min    Patient has met 4 of 4 short term goals.  Pt is making steady progress towards goals.  Pt currently requires min assist stand or squat pivot transfers with therapist providing min cues for LLE placement and trunk control during mobility.  Pt currently requires min assist with UB dressing (to fasten bra) and min assist with LB dressing (assistance to pull pants over Lt hip).  Pt is demonstrating increased functional use of LUE during self-care tasks and self-feeding.  Pt demonstrates good motor control in low range and is developing increased movement in mid range.  Pt reports "stretch" in shoulder in mid to mid-high range.   Patient continues to demonstrate the following deficits: muscle weakness,impaired timing and sequencing, abnormal tone and unbalanced muscle activation,decreased visual perceptual skills,decreased attention to left,decreased awareness, decreased problem solving, decreased safety awareness and decreased memoryand decreased sitting balance, decreased standing balance, decreased postural control, hemiplegia and decreased balance strategies and therefore will continue to benefit from skilled OT intervention to enhance overall performance with BADL and Reduce care partner burden.  Patient progressing toward long term goals..  Continue plan of care.  OT Short Term Goals Week 3:  OT Short Term Goal 1 (Week 3): Pt will complete LB dressing with min assist at sit > stand level OT Short Term Goal 1 - Progress (Week 3): Met OT Short Term Goal 2 (Week 3): Pt will complete toilet transfers with min assist OT Short Term Goal 2 - Progress (Week 3):  Met OT Short Term Goal 3 (Week 3): Pt will complete shower transfers with mod assist OT Short Term Goal 3 - Progress (Week 3): Met OT Short Term Goal 4 (Week 3): Pt will utilize LUE as gross assist with min cues OT Short Term Goal 4 - Progress (Week 3): Met Week 4:  OT Short Term Goal 1 (Week 4): STG = LTGs due to remaining LOS  Skilled Therapeutic Interventions/Progress Updates:    Treatment session with focus on LUE NMR in sitting and standing.  Pt received upright in w/c reporting already dressed and completed grooming tasks.  Engaged in [place] in standing at high low table with focus on weight shifting through LLE while engaging in table top task.  Facilitated weight shift through LLE by reaching outside BOS with LUE for weight shift and to obtain items on Lt side.  Mod cues for weight shift throughout task, as pt would tend to bear majority of weight through RLE.  Utilized LUE to complete peg board pattern with pt requiring increased time to grasp and manipulate pegs but able to complete in standing.  Completed remainder of peg board activity in sitting with focus on increased range with reaching, requiring mid range as table remained at same height.  Pt demonstrating increased difficulty at mid range.  Returned to room and completed stand pivot transfer min assist to bed.  Therapy Documentation Precautions:  Precautions Precautions: Fall Precaution Comments: L hemi, L inattention/field cut Restrictions Weight Bearing Restrictions: No Pain:  pt with no c/o pain  See Function Navigator for Current Functional Status.   Therapy/Group: Individual Therapy  [name] 08/12/2018, 10:18 AM

## 2018-08-12 NOTE — Progress Notes (Signed)
Social Work Patient ID: [name], female   DOB: 03-02-1946, 72 y.o.   MRN: [id number]  Met with pt to discuss team conference progress and readiness to start pursue NHP. She would like to go to [facility] to continue her therapies. Will begin the NH process.

## 2018-08-12 NOTE — NC FL2 (Signed)
[place] LEVEL OF CARE SCREENING TOOL     IDENTIFICATION  Patient Name: [name] Birthdate: 1946/11/01 Sex: female Admission Date (Current Location): 07/19/2018  [facility] and [place] Number:  Herbalist and Address:  [facility], [place] [address] [place], [place] [address]      Provider Number: [id number]  Attending Physician Name and Address:  Charlett Blake, MD  Relative Name and Phone Number:  Darrick Penna Buskirk-daughter [phone]    Current Level of Care: Other (Comment)(Rehab) Recommended Level of Care: [place] Prior Approval Number:    Date Approved/Denied:   PASRR Number: [id number]  Discharge Plan: SNF    Current Diagnoses: Patient Active Problem List   Diagnosis Date Noted  . Spastic hemiparesis ([place])   . Current mild episode of major depressive disorder ([place])   . Intracerebral hemorrhage ([place]) 07/19/2018  . Acute CVA (cerebrovascular accident) ([place])   . Benign essential HTN   . Urinary retention   . Constipation   . ICH (intracerebral hemorrhage) ([place]) 07/13/2018  . Hypertension 07/06/2012  . Paroxysmal atrial fibrillation ([place]) 11/27/2011  . DIARRHEA, ACUTE 05/31/2009  . GERD 03/06/2009  . BACK PAIN 03/06/2009  . ANXIETY 06/12/2007  . DEPRESSION 06/12/2007  . HEADACHE 06/12/2007    Orientation RESPIRATION BLADDER Height & Weight     Self, Time, Situation, Place  Normal Continent Weight: 196 lb 6.9 oz (89.1 kg) Height:  5\' 6"  (167.6 cm)  BEHAVIORAL SYMPTOMS/MOOD NEUROLOGICAL BOWEL NUTRITION STATUS      Continent Diet(Heart healthy)  AMBULATORY STATUS COMMUNICATION OF NEEDS Skin   Limited Assist Verbally Normal                       Personal Care Assistance Level of Assistance  Bathing, Dressing, Feeding Bathing Assistance: Limited assistance Feeding assistance: Limited assistance Dressing Assistance: Limited assistance     Functional Limitations Dealer, Speech Sight Info: Impaired   Speech Info: Impaired    SPECIAL CARE FACTORS FREQUENCY  PT (By licensed PT), OT (By licensed OT), Speech therapy     PT Frequency: 5x week OT Frequency: 5x week     Speech Therapy Frequency: 5x week      Contractures Contractures Info: Not present    Additional Factors Info  Code Status, Allergies Code Status Info: Full Code Allergies Info: Penicillins, tape           Current Medications (08/12/2018):  This is the current hospital active medication list Current Facility-Administered Medications  Medication Dose Route Frequency Provider Last Rate Last Dose  . acetaminophen (TYLENOL) tablet 325-650 mg  325-650 mg Oral Q4H PRN Bary Leriche, PA-C   650 mg at 08/12/18 0752  . alum & mag hydroxide-simeth (MAALOX/MYLANTA) 200-200-20 MG/5ML suspension 30 mL  30 mL Oral Q4H PRN Bary Leriche, PA-C   30 mL at 07/27/18 0020  . aspirin EC tablet 81 mg  81 mg Oral Daily Rinehuls, David L, PA-C   81 mg at 08/12/18 0751  . benazepril (LOTENSIN) tablet 10 mg  10 mg Oral BID Charlett Blake, MD   10 mg at 08/12/18 0754  . bisacodyl (DULCOLAX) suppository 10 mg  10 mg Rectal Daily PRN Bary Leriche, PA-C   10 mg at 07/25/18 1808  . diphenhydrAMINE (BENADRYL) 12.5 MG/5ML elixir 12.5-25 mg  12.5-25 mg Oral Q6H PRN Bary Leriche, PA-C   25 mg at 07/28/18 2005  . flecainide (  TAMBOCOR) tablet 100 mg  100 mg Oral Daily Rinehuls, David L, PA-C   100 mg at 08/12/18 0752  . flecainide (TAMBOCOR) tablet 50 mg  50 mg Oral QHS Rinehuls, David L, PA-C   50 mg at 08/11/18 2148  . gabapentin (NEURONTIN) capsule 100 mg  100 mg Oral QHS Bary Leriche, PA-C   100 mg at 08/11/18 2148  . guaiFENesin-dextromethorphan (ROBITUSSIN DM) 100-10 MG/5ML syrup 5-10 mL  5-10 mL Oral Q6H PRN Love, Pamela S, PA-C      . lidocaine (XYLOCAINE) 2 % jelly   Topical PRN Love, Pamela S, PA-C      . metoprolol tartrate (LOPRESSOR) tablet 12.5 mg  12.5 mg Oral BID Love, Pamela S, PA-C    12.5 mg at 08/12/18 0752  . ondansetron (ZOFRAN) injection 4 mg  4 mg Intravenous Q6H PRN Love, Pamela S, PA-C       Or  . ondansetron (ZOFRAN) tablet 4 mg  4 mg Oral Q6H PRN Love, Pamela S, PA-C      . pantoprazole (PROTONIX) EC tablet 40 mg  40 mg Oral Daily Rinehuls, David L, PA-C   40 mg at 08/12/18 0751  . polyethylene glycol (MIRALAX / GLYCOLAX) packet 17 g  17 g Oral Daily PRN Bary Leriche, PA-C   17 g at 07/24/18 1031  . prochlorperazine (COMPAZINE) tablet 5-10 mg  5-10 mg Oral Q6H PRN Love, Pamela S, PA-C       Or  . prochlorperazine (COMPAZINE) injection 5-10 mg  5-10 mg Intramuscular Q6H PRN Love, Pamela S, PA-C       Or  . prochlorperazine (COMPAZINE) suppository 12.5 mg  12.5 mg Rectal Q6H PRN Love, Pamela S, PA-C      . senna-docusate (Senokot-S) tablet 2 tablet  2 tablet Oral QHS Bary Leriche, PA-C   2 tablet at 08/11/18 2149  . sertraline (ZOLOFT) tablet 50 mg  50 mg Oral QHS Bary Leriche, PA-C   50 mg at 08/11/18 2147  . sodium phosphate (FLEET) 7-19 GM/118ML enema 1 enema  1 enema Rectal Once PRN Love, Pamela S, PA-C      . zolpidem (AMBIEN) tablet 2.5 mg  2.5 mg Oral QHS PRN Love, Pamela S, PA-C   2.5 mg at 08/11/18 2151     Discharge Medications: Please see discharge summary for a list of discharge medications.  Relevant Imaging Results:  Relevant Lab Results:   Additional Information SSN: [id number]  Rayhana Slider, Gardiner Rhyme, LCSW

## 2018-08-12 NOTE — Progress Notes (Signed)
Speech Language Pathology Daily Session Note  Patient Details  Name: MARISELA LINE MRN: [id number] Date of Birth: July 29, 1946  Today's Date: 08/12/2018 SLP Individual Time: 0830-0930 SLP Individual Time Calculation (min): 60 min  Short Term Goals: Week 4: SLP Short Term Goal 1 (Week 4): STG=LTG due to ELOS  Skilled Therapeutic Interventions: Skilled treatment session focused cognition goals. SLP received pt emotional as it her wedding anniversary and her [birth year] 25 [birth year]. SLP provided emotional support and facilitated cognitive treatment by providing supervision cues to recall location of gift shop with pt able to navigate hospital to find location. Pt able to navigate left of her environment with supervision cues. Pt was returned to room, left upright in wheelchair, safety belt alarm on and all needs within reach. Continue per current plan of care.      Function:  Eating Eating                 Cognition Comprehension Comprehension assist level: Understands complex 90% of the time/cues 10% of the time  Expression   Expression assist level: Expresses complex 90% of the time/cues < 10% of the time  Social Interaction Social Interaction assist level: Interacts appropriately with others with medication or extra time (anti-anxiety, antidepressant).  Problem Solving Problem solving assist level: Solves complex 90% of the time/cues < 10% of the time  Memory Memory assist level: Complete Independence: No helper    Pain    Therapy/Group: Individual Therapy  Collis Thede 08/12/2018, 12:51 PM

## 2018-08-13 ENCOUNTER — Inpatient Hospital Stay (HOSPITAL_COMMUNITY): Payer: Medicare Other | Admitting: Speech Pathology

## 2018-08-13 ENCOUNTER — Inpatient Hospital Stay (HOSPITAL_COMMUNITY): Payer: Medicare Other | Admitting: Physical Therapy

## 2018-08-13 ENCOUNTER — Inpatient Hospital Stay (HOSPITAL_COMMUNITY): Payer: Medicare Other | Admitting: Occupational Therapy

## 2018-08-13 NOTE — Progress Notes (Signed)
Occupational Therapy Session Note  Patient Details  Name: [name] MRN: [id number] Date of Birth: 1946/05/27  Today's Date: 08/13/2018 OT Individual Time: 1000-1100 OT Individual Time Calculation (min): 60 min    Short Term Goals: Week 4:  OT Short Term Goal 1 (Week 4): STG = LTGs due to remaining LOS  Skilled Therapeutic Interventions/Progress Updates:    Treatment session with focus on ADL retraining with bathing at sit > stand level in room shower.  Pt completed stand pivot transfer w/c <> tub bench in shower with min assist, therapist providing min cues for placement of LLE prior to sit > stand for transfer.  Pt completed bathing with use of LUE at diminished level, even washing buttocks in standing with min/steady assist for standing balance.  Pt donned bra this session with education on fastening bra in front then rotating band around torso before threading straps.  Pt required assist to set up bra with Lt hand stabilizing hooks, then completed task with cues only.  Pt donned underwear and pants with min/steady assist with therapist providing cues to increase success with pulling pants and underwear over hips.  Pt pleased with progress.  Plan to attempt shoes during PM session as orthotist had taken Lt shoe to apply toe cap.  Therapy Documentation Precautions:  Precautions Precautions: Fall Precaution Comments: L hemi, L inattention/field cut Restrictions Weight Bearing Restrictions: No Pain: Pt with no c/o pain  See Function Navigator for Current Functional Status.   Therapy/Group: Individual Therapy  [name] 08/13/2018, 12:29 PM

## 2018-08-13 NOTE — Progress Notes (Signed)
Chaplain received spiritual consult for Advanced Directive.  Patient was in rehab when chaplain answered request.  Chaplain returned at 4 PM to go over AD and learned that the patient was ready to get it notarized, but it was too late in day for witnesses.  Patient will take the AD with her as she is getting discharged before Monday.  No follow up.  Tamsen Snider [phone] (pager)

## 2018-08-13 NOTE — Progress Notes (Signed)
Subjective/Complaints: Reviewed Ortho BPs, no dizziness this am ROS: Denies CP, SOB, N/V/D   Objective: Vital Signs: Blood pressure (!) 142/71, pulse 78, temperature 98.2 F (36.8 C), temperature source Oral, resp. rate 15, height 5\' 6"  (1.676 m), weight 89.1 kg, SpO2 99 %. No results found. No results found for this or any previous visit (from the past 72 hour(s)).   Constitutional: No distress . Vital signs reviewed. HENT: Normocephalic.  Atraumatic. Eyes: EOMI. No discharge. Cardiovascular: RRR. No JVD. Respiratory: CTA bilaterally. Normal effort. GI: BS +. Non-distended. Musc:tender at anterior thigh but not medial or posterior thigh Skin:   Intact. Warm and dry. Neuro:  Motor: LUE 3 triceps and grip proximal distal, now with 3 finger ext Left lower extremity: Hip flexion, knee extension 3 -/5, ankle dorsiflexion 1/5, 2- ankle plantarflexion   Psych: Brighter affect  Assessment/Plan: 1. 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 Physiatrist is providing close team supervision and 24 hour management of active medical problems listed below. Physiatrist and rehab team continue to assess barriers to discharge/monitor patient progress toward functional and medical goals. FIM: Function - Bathing Position: Shower Body parts bathed by patient: Right arm, Left arm, Chest, Abdomen, Front perineal area, Buttocks, Right upper leg, Left upper leg, Right lower leg, Left lower leg Body parts bathed by helper: Back Bathing not applicable: Front perineal area, Buttocks Assist Level: Touching or steadying assistance(Pt > 75%)  Function- Upper Body Dressing/Undressing What is the patient wearing?: Pull over shirt/dress, Bra Bra - Perfomed by patient: Thread/unthread right bra strap, Thread/unthread left bra strap Bra - Perfomed by helper:  Hook/unhook bra (pull down sports bra) Pull over shirt/dress - Perfomed by patient: Thread/unthread right sleeve, Put head through opening, Pull shirt over trunk, Thread/unthread left sleeve Pull over shirt/dress - Perfomed by helper: Thread/unthread left sleeve Assist Level: Touching or steadying assistance(Pt > 75%) Function - Lower Body Dressing/Undressing What is the patient wearing?: Underwear, Pants, Shoes, Ted Hose Position: Wheelchair/chair at [employer] - Performed by patient: Thread/unthread right underwear leg, Thread/unthread left underwear leg Underwear - Performed by helper: Pull underwear up/down Pants- Performed by patient: Thread/unthread right pants leg, Thread/unthread left pants leg Pants- Performed by helper: Pull pants up/down Non-skid slipper socks- Performed by helper: Don/doff right sock, Don/doff left sock Shoes - Performed by patient: Don/doff right shoe Shoes - Performed by helper: Don/doff left shoe TED Hose - Performed by helper: Don/doff right TED hose, Don/doff left TED hose Assist for footwear: Partial/moderate assist Assist for lower body dressing: Touching or steadying assistance (Pt > 75%)(Mod assist)  Function - Toileting Toileting steps completed by patient: Performs perineal hygiene Toileting steps completed by helper: Adjust clothing prior to toileting, Performs perineal hygiene, Adjust clothing after toileting Toileting Assistive Devices: Grab bar or rail Assist level: Touching or steadying assistance (Pt.75%)  Function - Air cabin crew transfer activity did not occur: Safety/medical concerns Toilet transfer assistive device: Elevated toilet seat/BSC over toilet, Grab bar, Drop arm commode, Mechanical lift Mechanical lift: Stedy Assist level to toilet: Moderate assist (Pt 50 - 74%/lift or lower) Assist level from toilet: Moderate assist (Pt 50 - 74%/lift or lower) Assist level to bedside commode (at bedside): 2 helpers Assist level  from bedside commode (at bedside): 2 helpers  Function - Chair/bed transfer Chair/bed transfer method: Stand pivot Chair/bed transfer assist level: Moderate assist (Pt 50 - 74%/lift or lower) Chair/bed transfer  assistive device: Armrests Mechanical lift: Stedy Chair/bed transfer details: Verbal cues for technique, Verbal cues for safe use of DME/AE, Manual facilitation for weight shifting, Manual facilitation for placement  Function - Locomotion: Wheelchair Will patient use wheelchair at discharge?: Yes Type: Manual Max wheelchair distance: 100 ft  Assist Level: Supervision or verbal cues Assist Level: Supervision or verbal cues Wheel 150 feet activity did not occur: Safety/medical concerns Assist Level: Supervision or verbal cues Turns around,maneuvers to table,bed, and toilet,negotiates 3% grade,maneuvers on rugs and over doorsills: No Function - Locomotion: Ambulation Assistive device: Walker-rolling(L hand orthosis, L AFO) Max distance: 64' Assist level: Moderate assist (Pt 50 - 74%) Assist level: Moderate assist (Pt 50 - 74%) Walk 50 feet with 2 turns activity did not occur: Safety/medical concerns Walk 150 feet activity did not occur: Safety/medical concerns Walk 10 feet on uneven surfaces activity did not occur: Safety/medical concerns  Function - Comprehension Comprehension: Auditory Comprehension assist level: Understands complex 90% of the time/cues 10% of the time  Function - Expression Expression: Verbal Expression assist level: Expresses complex 90% of the time/cues < 10% of the time  Function - Social Interaction Social Interaction assist level: Interacts appropriately with others with medication or extra time (anti-anxiety, antidepressant).  Function - Problem Solving Problem solving assist level: Solves complex 90% of the time/cues < 10% of the time  Function - Memory Memory assist level: Complete Independence: No helper Patient normally able to recall  (first 3 days only): Current season, That he or she is in a hospital, Location of own room, Staff names and faces  Medical Problem List and Plan: 1. Decline in functional status due to resultant left-sided weakness with sensory deficits and left homonymous  hemianopsia secondary to large Right occipitoparietal ICH  Cont CIR PT, OT SLPhas made excellent progress but transfers still Mod A will need SNF since pt only has int sup at home  .2.  DVT Prophylaxis/Anticoagulation: Mechanical: Sequential compression devices, below knee Bilateral lower extremities 3. Pain Management: Continue Tylenol as needed for headaches, quad soreness left due to exercise no adductor or hamstring soreness 4. Mood: LCSW to follow for evaluation and support, neuropsych to follow , on Zoloft 50mg  Slept well 5. Neuropsych: This patient is capable of making decisions on her own behalf. 6. Skin/Wound Care: Routine pressure relief measures 7. Fluids/Electrolytes/Nutrition: Monitor I's and O's.  BMP wnl 9/23, I 469ml on 9/26 8. HTN: Monitor blood pressure twice daily   Resting sys BP needs to run ~135-155 to avoid symptomatic orthostasis Vitals:   08/13/18 0649 08/13/18 0651  BP: (!) 174/74 (!) 142/71  Pulse: 68 78  Resp:    Temp:    SpO2: 99% 99%  9.  PAF: Monitor heart rate twice daily-- appears in NSR on Tambocor and metoprolol twice daily. Bradycardia improved initially with reduction of metoprolol to  12.5mg  BID no dizziness HR controlled on lower dose 10. Constipation: senna 2 po qhs 11.  Urinary retention: resolved    12.  Spasticity improved will stop tizanidine qhs and monitor    LOS (Days) 25 A FACE TO FACE EVALUATION WAS PERFORMED  Charlett Blake 08/13/2018, 8:30 AM

## 2018-08-13 NOTE — Progress Notes (Addendum)
Physical Therapy Discharge Summary  Patient Details  Name: [name] MRN: [id number] Date of Birth: [birth year]  Today's Date: 08/15/2018 PT Individual Time: 1050-1145 PT Individual Time Calculation (min): 55 min   Pt in supine and agreeable to therapy, no c/o pain. Session focused on functional mobility. Performed bed mobility, bed<>chair transfer, car transfer, w/c mobility, stairs, and gait as detailed below. Intermittent seated rest break 2/2 fatigue w/ activities. Returned to room via w/c, pt self-propelled w/c around unit w/ supervision using R hemi technique in between activities to work on functional endurance. Ended session in w/c, call bell in reach and all needs met.   Patient has met 10 of 10 long term goals due to improved activity tolerance, improved balance, improved postural control, increased strength, increased range of motion, decreased pain, ability to compensate for deficits, functional use of  left upper extremity and left lower extremity, improved attention, improved awareness and improved coordination.  Patient to discharge at supervision for w/c mobility, min assist for transfers, min assist for gait with RW, L AFO, L toe cap & L hand orthosis for short distances.   Patient's care partner unavailable to provide the necessary physical and cognitive assistance at discharge.  Reasons goals not met: n/a  Recommendation:  Patient will benefit from ongoing skilled PT services in skilled nursing facility setting to continue to advance safe functional mobility, address ongoing impairments in L attention & NMR, sitting and standing balance, cognitive remediation, transfers, gait, stair negotiation, safety awareness, and minimize fall risk.  Equipment: TBD in next venue of care (pt currently using 18x18 w/c, L AFO, RW with L hand orthosis)  Reasons for discharge: treatment goals met and discharge from hospital  Patient/family agrees with progress made and goals achieved:  Yes  PT Discharge Precautions/Restrictions Precautions Precautions: Fall Restrictions Weight Bearing Restrictions: No  Vision/Perception  Pt wears glasses at all times. Pt denies any changes in baseline vision. Pt with decreased attention to L side of body & environment.  Pt with R gaze preference.  Cognition Overall Cognitive Status: Impaired/Different from baseline Arousal/Alertness: Awake/alert Orientation Level: Oriented X4 Memory: Impaired Memory Impairment: Decreased recall of new information Awareness: Impaired Awareness Impairment: Anticipatory impairment Problem Solving: Impaired Problem Solving Impairment: Functional complex Safety/Judgment: Impaired   Sensation Sensation Light Touch: Appears Intact(BLE) Proprioception: Impaired by gross assessment Proprioception Impaired Details: Impaired LLE;Impaired LUE Coordination Gross Motor Movements are Fluid and Coordinated: No Fine Motor Movements are Fluid and Coordinated: No  Motor  Motor Motor: Abnormal tone;Abnormal postural alignment and control;Hemiplegia Motor - Discharge Observations: generalized deconditioning   Mobility Bed Mobility Bed Mobility: Rolling Right;Rolling Left;Sit to Supine;Right Sidelying to Sit Rolling Right: Supervision/verbal cueing Rolling Left: Supervision/Verbal cueing Right Sidelying to Sit: Supervision/Verbal cueing Sit to Supine: Supervision/Verbal cueing Transfers Transfers: Sit to Stand;Stand to Sit;Stand Pivot Transfers;Squat Pivot Transfers Sit to Stand: Minimal Assistance - Patient > 75%(with armrests) Stand to Sit: Moderate Assistance - Patient 50-74%;Minimal Assistance - Patient > 75%(cuing & assist for hand placement & eccentric control) Stand Pivot Transfers: Minimal Assistance - Patient > 75% Stand Pivot Transfer Details: Tactile cues for sequencing;Tactile cues for weight shifting;Tactile cues for placement;Verbal cues for technique;Verbal cues for sequencing;Manual  facilitation for placement;Manual facilitation for weight shifting Squat Pivot Transfers: Minimal Assistance - Patient > 75%(with armrests)  Locomotion  Gait Ambulation: Yes Gait Assistance: Minimal Assistance - Patient > 75% Gait Distance (Feet): 165 Feet Assistive device: Rolling walker(L hand orthosis, L AFO, L toe cap) Gait Assistance Details: Manual facilitation for  weight shifting(verbal cuing for RW management, L attention) Gait Gait: Yes Gait Pattern: Decreased stride length;Decreased step length - left;Decreased step length - right;Decreased dorsiflexion - left;Decreased weight shift to left(impaired heel strike LLE) Gait velocity: decreased Stairs / Additional Locomotion Stairs: Yes Stairs Assistance: Minimal Assistance - Patient > 75% Stair Management Technique: One rail Right Number of Stairs: 12 Height of Stairs: (6" + 3") Ramp: Minimal Assistance - Patient >75%(ambulatory with RW, L hand orthosis, L AFO, L toe cap) Wheelchair Mobility Wheelchair Mobility: Yes Wheelchair Assistance: Supervision/Verbal cueing Wheelchair Propulsion: Right upper extremity;Right lower extremity Wheelchair Parts Management: Needs assistance Distance: 150 ft   Pt was able to ambulate 30 ft without AD but L AFO, L toe cap with mod assist on 08/13/18.   Trunk/Postural Assessment  Lumbar Assessment Lumbar Assessment: Exceptions to WFL(posterior pelvic tilt) Postural Control Righting Reactions: impaired Protective Responses: impaired   Balance Balance Balance Assessed: Yes Static Sitting Balance Static Sitting - Balance Support: Feet supported;No upper extremity supported Static Sitting - Level of Assistance: 5: Stand by assistance Dynamic Sitting Balance Dynamic Sitting - Balance Support: Feet supported;No upper extremity supported Dynamic Sitting - Level of Assistance: 5: Stand by assistance Static Standing Balance Static Standing - Balance Support: No upper extremity  supported;During functional activity Static Standing - Level of Assistance: 5: Stand by assistance Dynamic Standing Balance Dynamic Standing - Balance Support: No upper extremity supported;During functional activity Dynamic Standing - Level of Assistance: 4: Min assist  Extremity Assessment  RLE Assessment RLE Assessment: Within Functional Limits LLE Assessment LLE Assessment: Exceptions to [facility] Passive Range of Motion (PROM) Comments: WFL LLE Strength LLE Overall Strength: Deficits Left Hip Flexion: 4-/5 Left Hip Extension: 4-/5 Left Hip ABduction: 4-/5 Left Hip ADduction: 4-/5 Left Knee Flexion: 4/5 Left Knee Extension: 4/5 Left Ankle Dorsiflexion: 3+/5 Left Ankle Plantar Flexion: 3+/5   See Function Navigator for Current Functional Status.  Lavone Nian, PT, DPT 08/13/18, 4:32 PM  Clemmie Marxen Clent Demark, PT, DPT 08/15/2018, 11:54 AM

## 2018-08-13 NOTE — Progress Notes (Signed)
Speech Language Pathology Discharge Summary  Patient Details  Name: [name] MRN: [id number] Date of Birth: 06-Feb-1946  Today's Date: 08/13/2018 SLP Individual Time: 1100-1130 SLP Individual Time Calculation (min): 30 min   Skilled Therapeutic Interventions:  Skilled treatment session focused on high level congitive goals. SLP facilitated session by providing game of checkers with pt utilizing her LUE in moderately distracting environment. Pt required supervision cues to implement rules of game while dividing attention to effort in using LUE. Pt with increased left attention as a result of using LUE.    Patient has met 3 of 3 long term goals.  Patient to discharge at overall Modified Independent;Supervision level.  Clinical Impression/Discharge Summary:   Pt has made great progress in skilled ST session and as a result has met 3 of 3 LTGs and discharging to SNF at Mod I to Supervision level largely in part to continued physical deficits that prevent pt from being safe within home environment. When pt is ready to discharge home would recommend ST services to target anticipatory awareness within home environment.   Care Partner:  Caregiver Able to Provide Assistance: No  Type of Caregiver Assistance: Physical  Recommendation:  Skilled Nursing facility  Rationale for SLP Follow Up: Maximize cognitive function and independence;Reduce caregiver burden   Equipment:     Reasons for discharge: Treatment goals met;Discharged from hospital   Patient/Family Agrees with Progress Made and Goals Achieved: Yes   Function:    Cognition Comprehension Comprehension assist level: Follows complex conversation/direction with no assist  Expression   Expression assist level: Expresses complex ideas: With no assist  Social Interaction Social Interaction assist level: Interacts appropriately with others with medication or extra time (anti-anxiety, antidepressant).  Problem Solving Problem  solving assist level: Solves complex problems: With extra time  Memory Memory assist level: Complete Independence: No helper   Shakeera Rightmyer 08/13/2018, 1:01 PM

## 2018-08-13 NOTE — Progress Notes (Signed)
Physical Therapy Session Note  Patient Details  Name: [name] MRN: [id number] Date of Birth: July 03, 1946  Today's Date: 08/13/2018 PT Individual Time: 807-903 and 5093-2671 PT Individual Time Calculation (min): 56 min and 42 min   Short Term Goals: Week 4:  PT Short Term Goal 1 (Week 4): STG = LTG due to estimated d/c date.   Skilled Therapeutic Interventions/Progress Updates:  Treatment 1: Pt received in w/c & agreeable to tx. Pt reports 4/10 HA but reports being premedicated. Therapist dons knee high teds hose, L AFO & B shoes total assist for time management. Pt propels w/c around unit with R hemi technique and distant supervision with occasional cuing for steering/L obstacle avoidance especially when navigating doorways. Pt completes car transfer at sedan simulated height via stand pivot with RW & L hand orthosis with min assist and max cuing for sequencing & technique (square up to seat, eccentric control with stand>sit). Pt negotiates 12 steps with R rail and min assist with cuing for compensatory pattern but ability to advance LLE without assistance. Pt ambulates 75 ft with RW, L AFO & L hand orthosis with improved LLE foot clearance but min assist for balance and cuing for L attention, RW management, and sequencing turns to sit. Pt completes squat pivot transfer mat table>w/c with min assist with cuing for hand placement and min manual facilitation for pivot. At end of session pt left sitting in w/c in room with chair alarm donned & all needs in reach.    Treatment 2: Pt received in w/c & agreeable to tx, denying c/o pain. Pt propels w/c with R hemi technique & distant supervision. Pt ambulates over ramp & uneven surface (mulch) with RW, L hand orthosis, L AFO & L toe cap with min assist with cuing for decreased speed, L foot clearance/attention to L foot, and for balance. Pt completes bed mobility in apartment with supervision and cuing for L attention and intermittently uses RLE  to assist LLE onto bed. Pt transfers w/c<>bed via stand pivot with min assist with ongoing cuing for sequencing to turn to sit. Pt ambulates 30 ft without AD and mod assist for balance with task focusing on dynamic balance, weight shifting, and L NMR. At end of session pt left sitting in w/c in room with chair alarm donned & all needs in reach.  Therapy Documentation Precautions:  Precautions Precautions: Fall Precaution Comments: L hemi, L inattention/field cut Restrictions Weight Bearing Restrictions: No   See Function Navigator for Current Functional Status.   Therapy/Group: Individual Therapy  [name] 08/13/2018, 4:21 PM

## 2018-08-13 NOTE — Progress Notes (Signed)
Occupational Therapy Session Note  Patient Details  Name: [name] MRN: [id number] Date of Birth: 1946/05/01  Today's Date: 08/13/2018 OT Individual Time: 1400-1430 OT Individual Time Calculation (min): 30 min    Short Term Goals: Week 4:  OT Short Term Goal 1 (Week 4): STG = LTGs due to remaining LOS  Skilled Therapeutic Interventions/Progress Updates:    Treatment session with focus on LB dressing and functional use of LUE.  Pt received upright in w/c having received Lt shoe from orthotist.  Educated on donning Lt shoe with AFO, pt continues to require assistance and will benefit from continued practice.  Therapist applied shoe button to Lt shoe with pt able to demonstrate ability to fasten shoe button.  Pt able to don Rt shoe and tie shoelaces!  Engaged in box and blocks assessment with pt moving 54 blocks with RUE and 18 with LUE.  Due to improved fine and gross motor coordination, completed 9 hole peg test with pt able to complete Lt: 1:28 and Rt: 26 seconds.  Pt demonstrating shoulder flexion 120* in sitting.  Pt left seated upright in w/c with all needs in reach.  Therapy Documentation Precautions:  Precautions Precautions: Fall Precaution Comments: L hemi, L inattention/field cut Restrictions Weight Bearing Restrictions: No General:   Vital Signs: Therapy Vitals Temp: 98.2 F (36.8 C) Pulse Rate: 66 Resp: 14 BP: 123/65 Patient Position (if appropriate): Lying Oxygen Therapy SpO2: 96 % O2 Device: Room Air Pain: Pain Assessment Pain Scale: 0-10 Pain Score: 0-No pain Faces Pain Scale: No hurt  See Function Navigator for Current Functional Status.   Therapy/Group: Individual Therapy  [name] 08/13/2018, 3:48 PM

## 2018-08-13 NOTE — Progress Notes (Signed)
Social Work Patient ID: [name], female   DOB: Jan 02, 1946, 72 y.o.   MRN: [id number]  Spoke with [facility] who has a bed for pt on Monday. Have informed pt Monday will tranfers to [facility] after  lunch. She is pleased with this plan. Will inform team, MD & PA.

## 2018-08-14 ENCOUNTER — Inpatient Hospital Stay (HOSPITAL_COMMUNITY): Payer: Medicare Other

## 2018-08-14 ENCOUNTER — Inpatient Hospital Stay (HOSPITAL_COMMUNITY): Payer: Medicare Other | Admitting: Occupational Therapy

## 2018-08-14 NOTE — Progress Notes (Signed)
Occupational Therapy Session Note  Patient Details  Name: [name] MRN: [id number] Date of Birth: 1946-04-16  Today's Date: 08/14/2018 OT Individual Time: 1300-1413 OT Individual Time Calculation (min): 73 min    Short Term Goals: Week 3:  OT Short Term Goal 1 (Week 3): Pt will complete LB dressing with min assist at sit > stand level OT Short Term Goal 1 - Progress (Week 3): Met OT Short Term Goal 2 (Week 3): Pt will complete toilet transfers with min assist OT Short Term Goal 2 - Progress (Week 3): Met OT Short Term Goal 3 (Week 3): Pt will complete shower transfers with mod assist OT Short Term Goal 3 - Progress (Week 3): Met OT Short Term Goal 4 (Week 3): Pt will utilize LUE as gross assist with min cues OT Short Term Goal 4 - Progress (Week 3): Met  Skilled Therapeutic Interventions/Progress Updates:    Upon entering the room, pt supine in bed with family present in the room. Pt requesting to wash at sink this session. Supine >sit with supervision and min A stand pivot transfer into wheelchair. Pt bathing from wheelchair at sink with min A sit <>stand and min A for balance. Pt utilized L UE throughout session without cues to do so. Pt requiring increased time and min verbal cues for technique to pull pants over B hips but pt was successful. OT assisted pt to dayroom via wheelchair. Focus on L UE NMR for strengthening and coordination. Red resistive theraputty utilized with OT providing demonstrations of exercises and pt returning demonstrations with min cuing for proper technique. Pt required multiple rest breaks secondary to hand fatigue. Pt returning to room at end of session. Chair alarm belt activated and call bell within reach.   Therapy Documentation Precautions:  Precautions Precautions: Fall Precaution Comments: L hemi, L inattention/field cut Restrictions Weight Bearing Restrictions: No General:   Vital Signs: Therapy Vitals Pulse Rate: (!) 57 BP: (!)  147/58 Pain: Pain Assessment Pain Scale: 0-10 Pain Score: 6  Pain Type: Acute pain Pain Location: Head Pain Descriptors / Indicators: Headache Pain Frequency: Intermittent Pain Onset: Gradual Pain Intervention(s): Medication (See eMAR)  See Function Navigator for Current Functional Status.   Therapy/Group: Individual Therapy  Gypsy Decant 08/14/2018, 3:04 PM

## 2018-08-14 NOTE — Progress Notes (Signed)
Subjective/Complaints: No new complaints today.  Had a good night sleep.  ROS: Patient denies fever, rash, sore throat, blurred vision, nausea, vomiting, diarrhea, cough, shortness of breath or chest pain, joint or back pain, headache, or mood change.   Objective: Vital Signs: Blood pressure (!) 109/54, pulse (!) 57, temperature 98 F (36.7 C), temperature source Oral, resp. rate 18, height 5\' 6"  (1.676 m), weight 89.1 kg, SpO2 99 %. No results found. No results found for this or any previous visit (from the past 72 hour(s)).   Constitutional: No distress . Vital signs reviewed. HEENT: EOMI, oral membranes moist Neck: supple Cardiovascular: RRR without murmur. No JVD    Respiratory: CTA Bilaterally without wheezes or rales. Normal effort    GI: BS +, non-tender, non-distended . Musc:tender at anterior thigh but not medial or posterior thigh Skin:   Intact. Warm and dry. Neuro:  Motor: LUE 3 triceps and grip proximal distal, now with 3 finger ext Left lower extremity: Hip flexion, knee extension 3 -/5, ankle dorsiflexion 1/5, 2- ankle plantarflexion   Psych: Pleasant and appropriate  Assessment/Plan: 1. 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 Physiatrist is providing close team supervision and 24 hour management of active medical problems listed below. Physiatrist and rehab team continue to assess barriers to discharge/monitor patient progress toward functional and medical goals. FIM: Function - Bathing Position: Shower Body parts bathed by patient: Right arm, Left arm, Chest, Abdomen, Front perineal area, Buttocks, Right upper leg, Left upper leg, Right lower leg, Left lower leg Body parts bathed by helper: Back Bathing not applicable: Front perineal area, Buttocks Assist Level: Touching or steadying assistance(Pt > 75%)  Function-  Upper Body Dressing/Undressing What is the patient wearing?: Pull over shirt/dress, Bra Bra - Perfomed by patient: Thread/unthread right bra strap, Thread/unthread left bra strap, Hook/unhook bra (pull down sports bra) Bra - Perfomed by helper: Hook/unhook bra (pull down sports bra) Pull over shirt/dress - Perfomed by patient: Thread/unthread right sleeve, Put head through opening, Pull shirt over trunk, Thread/unthread left sleeve Pull over shirt/dress - Perfomed by helper: Thread/unthread left sleeve Assist Level: Supervision or verbal cues Function - Lower Body Dressing/Undressing What is the patient wearing?: Shoes, AFO Position: Wheelchair/chair at sink Underwear - Performed by patient: Thread/unthread right underwear leg, Thread/unthread left underwear leg, Pull underwear up/down Underwear - Performed by helper: Pull underwear up/down Pants- Performed by patient: Thread/unthread right pants leg, Thread/unthread left pants leg, Pull pants up/down Pants- Performed by helper: Pull pants up/down Non-skid slipper socks- Performed by helper: Don/doff right sock, Don/doff left sock Shoes - Performed by patient: Don/doff right shoe, Fasten right, Fasten left Shoes - Performed by helper: Don/doff left shoe AFO - Performed by helper: Don/doff left AFO TED Hose - Performed by helper: Don/doff right TED hose, Don/doff left TED hose Assist for footwear: Supervision/touching assist Assist for lower body dressing: Touching or steadying assistance (Pt > 75%)  Function - Toileting Toileting steps completed by patient: Performs perineal hygiene Toileting steps completed by helper: Adjust clothing prior to toileting, Performs perineal hygiene, Adjust clothing after toileting Toileting Assistive Devices: Grab bar or rail Assist level: Touching or steadying assistance (Pt.75%)  Function - Air cabin crew transfer activity did not occur: Safety/medical concerns Toilet transfer assistive device:  Elevated toilet seat/BSC over toilet, Grab bar, Drop arm commode, Mechanical lift Mechanical lift: Stedy Assist level to toilet: Moderate assist (Pt  50 - 74%/lift or lower) Assist level from toilet: Moderate assist (Pt 50 - 74%/lift or lower) Assist level to bedside commode (at bedside): 2 helpers Assist level from bedside commode (at bedside): 2 helpers  Function - Chair/bed transfer Chair/bed transfer method: Squat pivot Chair/bed transfer assist level: Touching or steadying assistance (Pt > 75%) Chair/bed transfer assistive device: Armrests Mechanical lift: Stedy Chair/bed transfer details: Verbal cues for technique, Verbal cues for safe use of DME/AE, Tactile cues for placement, Tactile cues for weight shifting, Tactile cues for sequencing, Manual facilitation for weight shifting, Manual facilitation for placement  Function - Locomotion: Wheelchair Will patient use wheelchair at discharge?: Yes Type: Manual Max wheelchair distance: 150 ft (R hemi technique) Assist Level: Supervision or verbal cues Assist Level: Supervision or verbal cues Wheel 150 feet activity did not occur: Safety/medical concerns Assist Level: Supervision or verbal cues Turns around,maneuvers to table,bed, and toilet,negotiates 3% grade,maneuvers on rugs and over doorsills: No Function - Locomotion: Ambulation Assistive device: Walker-rolling(L hand orthosis, L AFO, L toe cap) Max distance: 75 ft  Assist level: Touching or steadying assistance (Pt > 75%) Assist level: Touching or steadying assistance (Pt > 75%) Walk 50 feet with 2 turns activity did not occur: Safety/medical concerns Assist level: Touching or steadying assistance (Pt > 75%) Walk 150 feet activity did not occur: Safety/medical concerns Walk 10 feet on uneven surfaces activity did not occur: Safety/medical concerns Assist level: Touching or steadying assistance (Pt > 75%)(RW, L hand orthosis, L AFO, L toe cap)  Function -  Comprehension Comprehension: Auditory Comprehension assist level: Follows complex conversation/direction with no assist  Function - Expression Expression: Verbal Expression assist level: Expresses complex ideas: With no assist  Function - Social Interaction Social Interaction assist level: Interacts appropriately with others with medication or extra time (anti-anxiety, antidepressant).  Function - Problem Solving Problem solving assist level: Solves complex problems: With extra time  Function - Memory Memory assist level: Complete Independence: No helper Patient normally able to recall (first 3 days only): Current season, That he or she is in a hospital, Location of own room, Staff names and faces  Medical Problem List and Plan: 1. Decline in functional status due to resultant left-sided weakness with sensory deficits and left homonymous  hemianopsia secondary to large Right occipitoparietal ICH  Cont CIR PT, OT SLP  -SNF placement on Monday .2.  DVT Prophylaxis/Anticoagulation: Mechanical: Sequential compression devices, below knee Bilateral lower extremities 3. Pain Management: Continue Tylenol as needed for headaches, quad soreness left due to exercise no adductor or hamstring soreness 4. Mood: LCSW to follow for evaluation and support, neuropsych to follow , on Zoloft 50mg  Slept well 5. Neuropsych: This patient is capable of making decisions on her own behalf. 6. Skin/Wound Care: Routine pressure relief measures 7. Fluids/Electrolytes/Nutrition: Continue to encourage p.o. intake 8. HTN: Monitor blood pressure twice daily   Resting sys BP needs to run ~135-155 to avoid symptomatic orthostasis Vitals:   08/13/18 1946 08/14/18 0607  BP: (!) 127/50 (!) 109/54  Pulse: 66 (!) 57  Resp: 18 18  Temp: 98.1 F (36.7 C) 98 F (36.7 C)  SpO2: 98% 99%  9.  PAF: Monitor heart rate twice daily-- appears in NSR on Tambocor and metoprolol twice daily.   HR controlled on lower dose  metoprolol 10. Constipation: senna 2 po qhs 11.  Urinary retention: resolved    12.  Spasticity improved will stop tizanidine qhs and monitor       LOS (Days) 26 A FACE  TO FACE EVALUATION WAS PERFORMED  Meredith Staggers 08/14/2018, 8:08 AM

## 2018-08-14 NOTE — Progress Notes (Signed)
Occupational Therapy Session Note  Patient Details  Name: [name] MRN: [id number] Date of Birth: 01/19/1946  Today's Date: 08/14/2018 OT Individual Time: 1530-1613 OT Individual Time Calculation (min): 43 min    Short Term Goals: Week 1:  OT Short Term Goal 1 (Week 1): Pt will complete LB bathing with max assist OT Short Term Goal 1 - Progress (Week 1): Met OT Short Term Goal 2 (Week 1): Pt will complete toilet transfers with max assist of 1 caregiver OT Short Term Goal 2 - Progress (Week 1): Progressing toward goal OT Short Term Goal 3 (Week 1): Pt will don pants with max assist of 1 caregiver OT Short Term Goal 3 - Progress (Week 1): Progressing toward goal  Skilled Therapeutic Interventions/Progress Updates:    1;1. Pt received seated in w/c with no c/o pain. Pt agreeable to practice simulated toileting. Pt compeltes stand pivot transfer with grab bar and CGA after VC for foot placement. Pt able to manage clothing with CGA and problem solve how to reach pants down on floor when they fall down. Pt completes no sew blanket activity for improved BUE use, coordination and use of LUE as support while cutting strips of fabric. Pt able to tie strips into knots with BUE for FMC/NMR and min VC for tieing correct strips together and forced use of LUE to tuck fabric under when tieing knots. Exited sessio with pt seated in bed call light in reach and all needs met  Therapy Documentation Precautions:  Precautions Precautions: Fall Precaution Comments: L hemi, L inattention/field cut Restrictions Weight Bearing Restrictions: No  See Function Navigator for Current Functional Status.   Therapy/Group: Individual Therapy  Tonny Branch 08/14/2018, 3:09 PM

## 2018-08-15 ENCOUNTER — Inpatient Hospital Stay (HOSPITAL_COMMUNITY): Payer: Medicare Other | Admitting: Physical Therapy

## 2018-08-15 ENCOUNTER — Inpatient Hospital Stay (HOSPITAL_COMMUNITY): Payer: Medicare Other

## 2018-08-15 NOTE — Progress Notes (Signed)
Subjective/Complaints: Pt without complaints. Resting well. No pain  ROS: Patient denies fever, rash, sore throat, blurred vision, nausea, vomiting, diarrhea, cough, shortness of breath or chest pain, joint or back pain, headache, or mood change.    Objective: Vital Signs: Blood pressure 138/64, pulse 62, temperature 98.6 F (37 C), temperature source Oral, resp. rate 18, height 5\' 6"  (1.676 m), weight 89.1 kg, SpO2 98 %. No results found. No results found for this or any previous visit (from the past 72 hour(s)).   Constitutional: No distress . Vital signs reviewed. HEENT: EOMI, oral membranes moist Neck: supple Cardiovascular: RRR without murmur. No JVD    Respiratory: CTA Bilaterally without wheezes or rales. Normal effort    GI: BS +, non-tender, non-distended . Musc:sl tender at anterior thigh but not medial or posterior thigh Skin:   Intact. Warm and dry. Neuro:  Motor: LUE 3 triceps and grip proximal distal, now with 3 finger ext Left lower extremity: Hip flexion, knee extension 3 -/5, ankle dorsiflexion 1/5, 2- ankle plantarflexion   Psych: Pleasant and appropriate  Assessment/Plan: 1. 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 Physiatrist is providing close team supervision and 24 hour management of active medical problems listed below. Physiatrist and rehab team continue to assess barriers to discharge/monitor patient progress toward functional and medical goals. FIM: Function - Bathing Position: Wheelchair/chair at sink Body parts bathed by patient: Right arm, Left arm, Chest, Abdomen, Front perineal area, Buttocks, Right upper leg, Left upper leg, Right lower leg, Left lower leg Body parts bathed by helper: Back Bathing not applicable: Front perineal area, Buttocks Assist Level: Touching or steadying assistance(Pt >  75%)  Function- Upper Body Dressing/Undressing What is the patient wearing?: Pull over shirt/dress Bra - Perfomed by patient: Thread/unthread right bra strap, Thread/unthread left bra strap, Hook/unhook bra (pull down sports bra) Bra - Perfomed by helper: Hook/unhook bra (pull down sports bra) Pull over shirt/dress - Perfomed by patient: Thread/unthread right sleeve, Put head through opening, Pull shirt over trunk, Thread/unthread left sleeve Pull over shirt/dress - Perfomed by helper: Thread/unthread left sleeve Assist Level: Supervision or verbal cues, Set up Set up : To obtain clothing/put away Function - Lower Body Dressing/Undressing What is the patient wearing?: Non-skid slipper socks, Underwear, Pants Position: Wheelchair/chair at sink Underwear - Performed by patient: Thread/unthread right underwear leg, Thread/unthread left underwear leg, Pull underwear up/down Underwear - Performed by helper: Pull underwear up/down Pants- Performed by patient: Thread/unthread right pants leg, Thread/unthread left pants leg, Pull pants up/down Pants- Performed by helper: Pull pants up/down Non-skid slipper socks- Performed by patient: Don/doff right sock, Don/doff left sock Non-skid slipper socks- Performed by helper: Don/doff right sock, Don/doff left sock Shoes - Performed by patient: Don/doff right shoe, Fasten right, Fasten left Shoes - Performed by helper: Don/doff left shoe AFO - Performed by helper: Don/doff left AFO TED Hose - Performed by helper: Don/doff right TED hose, Don/doff left TED hose Assist for footwear: Supervision/touching assist Assist for lower body dressing: Touching or steadying assistance (Pt > 75%)  Function - Toileting Toileting steps completed by patient: Adjust clothing prior to toileting, Adjust clothing after toileting, Performs perineal hygiene Toileting steps completed by helper: Adjust clothing after toileting Toileting Assistive Devices: Grab bar or  rail Assist level: Touching or steadying assistance (Pt.75%)  Function - Air cabin crew transfer activity did not occur: Safety/medical concerns Toilet transfer assistive  device: Elevated toilet seat/BSC over toilet, Grab bar, Drop arm commode, Mechanical lift Mechanical lift: Stedy Assist level to toilet: Supervision or verbal cues Assist level from toilet: Supervision or verbal cues Assist level to bedside commode (at bedside): 2 helpers Assist level from bedside commode (at bedside): 2 helpers  Function - Chair/bed transfer Chair/bed transfer method: Stand pivot Chair/bed transfer assist level: Touching or steadying assistance (Pt > 75%) Chair/bed transfer assistive device: Armrests Mechanical lift: Stedy Chair/bed transfer details: Verbal cues for technique, Verbal cues for safe use of DME/AE, Tactile cues for placement, Tactile cues for weight shifting, Tactile cues for sequencing, Manual facilitation for weight shifting, Manual facilitation for placement  Function - Locomotion: Wheelchair Will patient use wheelchair at discharge?: Yes Type: Manual Max wheelchair distance: 150 ft (R hemi technique) Assist Level: Supervision or verbal cues Assist Level: Supervision or verbal cues Wheel 150 feet activity did not occur: Safety/medical concerns Assist Level: Supervision or verbal cues Turns around,maneuvers to table,bed, and toilet,negotiates 3% grade,maneuvers on rugs and over doorsills: No Function - Locomotion: Ambulation Assistive device: Walker-rolling(L hand orthosis, L AFO, L toe cap) Max distance: 75 ft  Assist level: Touching or steadying assistance (Pt > 75%) Assist level: Touching or steadying assistance (Pt > 75%) Walk 50 feet with 2 turns activity did not occur: Safety/medical concerns Assist level: Touching or steadying assistance (Pt > 75%) Walk 150 feet activity did not occur: Safety/medical concerns Walk 10 feet on uneven surfaces activity did not occur:  Safety/medical concerns Assist level: Touching or steadying assistance (Pt > 75%)(RW, L hand orthosis, L AFO, L toe cap)  Function - Comprehension Comprehension: Auditory Comprehension assist level: Follows complex conversation/direction with no assist  Function - Expression Expression: Verbal Expression assist level: Expresses complex ideas: With no assist  Function - Social Interaction Social Interaction assist level: Interacts appropriately with others with medication or extra time (anti-anxiety, antidepressant).  Function - Problem Solving Problem solving assist level: Solves complex problems: With extra time  Function - Memory Memory assist level: Complete Independence: No helper Patient normally able to recall (first 3 days only): Current season, That he or she is in a hospital, Location of own room, Staff names and faces  Medical Problem List and Plan: 1. Decline in functional status due to resultant left-sided weakness with sensory deficits and left homonymous  hemianopsia secondary to large Right occipitoparietal ICH  Cont CIR PT, OT SLP  -SNF placement on Monday .2.  DVT Prophylaxis/Anticoagulation: Mechanical: Sequential compression devices, below knee Bilateral lower extremities 3. Pain Management: Continue Tylenol as needed for headaches, quad soreness left due to exercise no adductor or hamstring soreness 4. Mood: LCSW to follow for evaluation and support, neuropsych to follow , on Zoloft 50mg  Slept well 5. Neuropsych: This patient is capable of making decisions on her own behalf. 6. Skin/Wound Care: Routine pressure relief measures 7. Fluids/Electrolytes/Nutrition: Continue to encourage p.o. intake  8. HTN: Monitor blood pressure twice daily   BP's controlled Vitals:   08/14/18 2029 08/15/18 0533  BP:  138/64  Pulse:  62  Resp: 18 18  Temp: 97.7 F (36.5 C) 98.6 F (37 C)  SpO2: 98% 98%  9.  PAF: Monitor heart rate twice daily-- appears in NSR on Tambocor and  metoprolol twice daily.   HR controlled on lower dose metoprolol 10. Constipation: senna 2 po qhs 11.  Urinary retention: resolved    12.  Spasticity improved , off tizanidine      LOS (Days) 27 A FACE  TO FACE EVALUATION WAS PERFORMED  Meredith Staggers 08/15/2018, 7:53 AM

## 2018-08-15 NOTE — Progress Notes (Signed)
Occupational Therapy Session Note  Patient Details  Name: [name] MRN: [id number] Date of Birth: 09-Jan-1946  Today's Date: 08/15/2018 OT Individual Time: 7182-0990 OT Individual Time Calculation (min): 57 min    Short Term Goals: Week 1:  OT Short Term Goal 1 (Week 1): Pt will complete LB bathing with max assist OT Short Term Goal 1 - Progress (Week 1): Met OT Short Term Goal 2 (Week 1): Pt will complete toilet transfers with max assist of 1 caregiver OT Short Term Goal 2 - Progress (Week 1): Progressing toward goal OT Short Term Goal 3 (Week 1): Pt will don pants with max assist of 1 caregiver OT Short Term Goal 3 - Progress (Week 1): Progressing toward goal  Skilled Therapeutic Interventions/Progress Updates:    1:1. Pt participates in bathing and dressing at overall CGA level for standing and supervision for sitting balance. Pt completes standpivot transfers EOB<>w/c<>BSC<>TTB with grab bar and CGA. PT bathes seated with supervision for bathing using lateral leans to wash buttocks. Pt dresses UB with A to spin bra around back. Pt able to fasten bra with BUE. PT dons shirt with OT providing A (5%) to untwist shirt in back. Pt able to thread BLE into pants with supervision and sit tos tand with steady A while advancing pants past hips. Grooms at sink sitting in w/c with MOD I for washing face and bushing teeth. Exited session with pt seated in bed, call light in reach and exit alarm on  Therapy Documentation Precautions:  Precautions Precautions: Fall Precaution Comments: L hemi, L inattention/field cut Restrictions Weight Bearing Restrictions: No General:  See Function Navigator for Current Functional Status.   Therapy/Group: Individual Therapy  Tonny Branch 08/15/2018, 8:27 AM

## 2018-08-16 DIAGNOSIS — R2689 Other abnormalities of gait and mobility: Secondary | ICD-10-CM | POA: Diagnosis not present

## 2018-08-16 DIAGNOSIS — R278 Other lack of coordination: Secondary | ICD-10-CM | POA: Diagnosis not present

## 2018-08-16 DIAGNOSIS — R41841 Cognitive communication deficit: Secondary | ICD-10-CM | POA: Diagnosis not present

## 2018-08-16 DIAGNOSIS — W050XXA Fall from non-moving wheelchair, initial encounter: Secondary | ICD-10-CM | POA: Diagnosis not present

## 2018-08-16 DIAGNOSIS — F329 Major depressive disorder, single episode, unspecified: Secondary | ICD-10-CM | POA: Diagnosis not present

## 2018-08-16 DIAGNOSIS — G8110 Spastic hemiplegia affecting unspecified side: Secondary | ICD-10-CM | POA: Diagnosis not present

## 2018-08-16 DIAGNOSIS — M2550 Pain in unspecified joint: Secondary | ICD-10-CM | POA: Diagnosis not present

## 2018-08-16 DIAGNOSIS — I611 Nontraumatic intracerebral hemorrhage in hemisphere, cortical: Secondary | ICD-10-CM | POA: Diagnosis not present

## 2018-08-16 DIAGNOSIS — M792 Neuralgia and neuritis, unspecified: Secondary | ICD-10-CM | POA: Diagnosis not present

## 2018-08-16 DIAGNOSIS — I613 Nontraumatic intracerebral hemorrhage in brain stem: Secondary | ICD-10-CM

## 2018-08-16 DIAGNOSIS — Z7401 Bed confinement status: Secondary | ICD-10-CM | POA: Diagnosis not present

## 2018-08-16 DIAGNOSIS — Z23 Encounter for immunization: Secondary | ICD-10-CM | POA: Diagnosis not present

## 2018-08-16 DIAGNOSIS — M6281 Muscle weakness (generalized): Secondary | ICD-10-CM | POA: Diagnosis not present

## 2018-08-16 DIAGNOSIS — I10 Essential (primary) hypertension: Secondary | ICD-10-CM | POA: Diagnosis not present

## 2018-08-16 DIAGNOSIS — I480 Paroxysmal atrial fibrillation: Secondary | ICD-10-CM | POA: Diagnosis not present

## 2018-08-16 DIAGNOSIS — R51 Headache: Secondary | ICD-10-CM | POA: Diagnosis not present

## 2018-08-16 DIAGNOSIS — I69122 Dysarthria following nontraumatic intracerebral hemorrhage: Secondary | ICD-10-CM | POA: Diagnosis not present

## 2018-08-16 DIAGNOSIS — I619 Nontraumatic intracerebral hemorrhage, unspecified: Secondary | ICD-10-CM | POA: Diagnosis not present

## 2018-08-16 DIAGNOSIS — I959 Hypotension, unspecified: Secondary | ICD-10-CM | POA: Diagnosis not present

## 2018-08-16 DIAGNOSIS — I951 Orthostatic hypotension: Secondary | ICD-10-CM | POA: Diagnosis not present

## 2018-08-16 DIAGNOSIS — G8114 Spastic hemiplegia affecting left nondominant side: Secondary | ICD-10-CM | POA: Diagnosis not present

## 2018-08-16 DIAGNOSIS — R261 Paralytic gait: Secondary | ICD-10-CM | POA: Diagnosis not present

## 2018-08-16 DIAGNOSIS — M6249 Contracture of muscle, multiple sites: Secondary | ICD-10-CM | POA: Diagnosis not present

## 2018-08-16 DIAGNOSIS — I69254 Hemiplegia and hemiparesis following other nontraumatic intracranial hemorrhage affecting left non-dominant side: Secondary | ICD-10-CM | POA: Diagnosis not present

## 2018-08-16 LAB — BASIC METABOLIC PANEL
ANION GAP: 7 (ref 5–15)
BUN: 21 mg/dL (ref 8–23)
CALCIUM: 10.1 mg/dL (ref 8.9–10.3)
CO2: 26 mmol/L (ref 22–32)
Chloride: 106 mmol/L (ref 98–111)
Creatinine, Ser: 1.08 mg/dL — ABNORMAL HIGH (ref 0.44–1.00)
GFR, EST AFRICAN AMERICAN: 59 mL/min — AB (ref >60)
GFR, EST NON AFRICAN AMERICAN: 51 mL/min — AB (ref >60)
Glucose, Bld: 113 mg/dL — ABNORMAL HIGH (ref 70–99)
Potassium: 4.3 mmol/L (ref 3.5–5.1)
SODIUM: 139 mmol/L (ref 135–145)

## 2018-08-16 MED ORDER — BENAZEPRIL HCL 10 MG PO TABS: 10.0000 mg | ORAL_TABLET | Freq: Two times a day (BID) | ORAL | Status: DC

## 2018-08-16 MED ORDER — SENNOSIDES-DOCUSATE SODIUM 8.6-50 MG PO TABS: 2.0000 | ORAL_TABLET | Freq: Every day | ORAL | Status: DC

## 2018-08-16 MED ORDER — PANTOPRAZOLE SODIUM 40 MG PO TBEC: 40.0000 mg | DELAYED_RELEASE_TABLET | Freq: Every day | ORAL | Status: DC

## 2018-08-16 MED ORDER — SERTRALINE HCL 50 MG PO TABS: 50.0000 mg | ORAL_TABLET | Freq: Every day | ORAL | Status: DC

## 2018-08-16 MED ORDER — POLYETHYLENE GLYCOL 3350 17 G PO PACK: 17.0000 g | PACK | Freq: Every day | ORAL | 0 refills | Status: DC | PRN

## 2018-08-16 MED ORDER — FLECAINIDE ACETATE 100 MG PO TABS: 100.0000 mg | ORAL_TABLET | Freq: Every day | ORAL | Status: DC

## 2018-08-16 MED ORDER — FLECAINIDE ACETATE 50 MG PO TABS: 50.0000 mg | ORAL_TABLET | Freq: Every day | ORAL | Status: DC

## 2018-08-16 MED ORDER — ZOLPIDEM TARTRATE 5 MG PO TABS: 2.5000 mg | ORAL_TABLET | Freq: Every evening | ORAL | 0 refills | Status: DC | PRN

## 2018-08-16 MED ORDER — GABAPENTIN 100 MG PO CAPS: 100.0000 mg | ORAL_CAPSULE | Freq: Every day | ORAL | Status: AC

## 2018-08-16 MED ORDER — METOPROLOL TARTRATE 25 MG PO TABS: 12.5000 mg | ORAL_TABLET | Freq: Two times a day (BID) | ORAL | Status: DC

## 2018-08-16 NOTE — Progress Notes (Signed)
Subjective/Complaints:  No issues overnite, discussed rehab post acute continuum.  ROS:No CP, SOB, N/V/D, voiding ok, denies dizziness  Objective: Vital Signs: Blood pressure (!) 126/57, pulse (!) 58, temperature 98.2 F (36.8 C), temperature source Oral, resp. rate 18, height 5' 6" (1.676 m), weight 89.1 kg, SpO2 96 %. No results found. Results for orders placed or performed during the hospital encounter of 07/19/18 (from the past 72 hour(s))  Basic metabolic panel     Status: Abnormal   Collection Time: 08/16/18  6:45 AM  Result Value Ref Range   Sodium 139 135 - 145 mmol/L   Potassium 4.3 3.5 - 5.1 mmol/L   Chloride 106 98 - 111 mmol/L   CO2 26 22 - 32 mmol/L   Glucose, Bld 113 (H) 70 - 99 mg/dL   BUN 21 8 - 23 mg/dL   Creatinine, Ser 1.08 (H) 0.44 - 1.00 mg/dL   Calcium 10.1 8.9 - 10.3 mg/dL   GFR calc non Af Amer 51 (L) >60 mL/min   GFR calc Af Amer 59 (L) >60 mL/min    Comment: (NOTE) The eGFR has been calculated using the CKD EPI equation. This calculation has not been validated in all clinical situations. eGFR's persistently <60 mL/min signify possible Chronic Kidney Disease.    Anion gap 7 5 - 15    Comment: Performed at [facility] [address], [place], [place] [address]     Constitutional: No distress . Vital signs reviewed. HEENT: EOMI, oral membranes moist Neck: supple Cardiovascular: RRR without murmur. No JVD    Respiratory: CTA Bilaterally without wheezes or rales. Normal effort    GI: BS +, non-tender, non-distended . Musc:sl tender at anterior thigh but not medial or posterior thigh Skin:   Intact. Warm and dry. Neuro:  Motor: LUE 3 triceps and grip proximal distal, now with 4- finger ext Left lower extremity: Hip flexion, knee extension 3 -/5, ankle dorsiflexion 3/5, 3- ankle plantarflexion   Psych: Pleasant and appropriate  Assessment/Plan: 1. Functional deficits secondary to Left hemiparesis due to RIght Parieto-occipital ICH which  require 3+ hours per day of interdisciplinary therapy in a comprehensive inpatient rehab setting. Physiatrist is providing close team supervision and 24 hour management of active medical problems listed below. Physiatrist and rehab team continue to assess barriers to discharge/monitor patient progress toward functional and medical goals. FIM: Function - Bathing Position: Shower Body parts bathed by patient: Right arm, Left arm, Chest, Abdomen, Front perineal area, Buttocks, Right upper leg, Left upper leg, Right lower leg, Left lower leg Body parts bathed by helper: Back Bathing not applicable: Front perineal area, Buttocks Assist Level: Supervision or verbal cues  Function- Upper Body Dressing/Undressing What is the patient wearing?: Pull over shirt/dress, Bra Bra - Perfomed by patient: Thread/unthread right bra strap, Thread/unthread left bra strap, Hook/unhook bra (pull down sports bra) Bra - Perfomed by helper: (A to spin) Pull over shirt/dress - Perfomed by patient: Thread/unthread right sleeve, Put head through opening, Thread/unthread left sleeve Pull over shirt/dress - Perfomed by helper: Pull shirt over trunk Assist Level: Supervision or verbal cues Set up : To obtain clothing/put away Function - Lower Body Dressing/Undressing What is the patient wearing?: Non-skid slipper socks, Underwear, Pants Position: Wheelchair/chair at sink Underwear - Performed by patient: Thread/unthread right underwear leg, Thread/unthread left underwear leg, Pull underwear up/down Underwear - Performed by helper: Pull underwear up/down Pants- Performed by patient: Thread/unthread right pants leg, Thread/unthread left pants leg, Pull pants up/down Pants- Performed  by helper: Pull pants up/down Non-skid slipper socks- Performed by patient: Don/doff right sock, Don/doff left sock Non-skid slipper socks- Performed by helper: Don/doff right sock, Don/doff left sock Shoes - Performed by patient: Don/doff  right shoe, Fasten right, Fasten left, Don/doff left shoe Shoes - Performed by helper: Don/doff left shoe AFO - Performed by helper: Don/doff left AFO TED Hose - Performed by helper: Don/doff right TED hose, Don/doff left TED hose Assist for footwear: Supervision/touching assist Assist for lower body dressing: Touching or steadying assistance (Pt > 75%)  Function - Toileting Toileting steps completed by patient: Adjust clothing prior to toileting, Adjust clothing after toileting, Performs perineal hygiene Toileting steps completed by helper: Adjust clothing after toileting Toileting Assistive Devices: Grab bar or rail Assist level: Touching or steadying assistance (Pt.75%)  Function - Air cabin crew transfer activity did not occur: Safety/medical concerns Toilet transfer assistive device: Elevated toilet seat/BSC over toilet, Grab bar, Drop arm commode, Mechanical lift Mechanical lift: Stedy Assist level to toilet: Supervision or verbal cues Assist level from toilet: Supervision or verbal cues Assist level to bedside commode (at bedside): 2 helpers Assist level from bedside commode (at bedside): 2 helpers  Function - Chair/bed transfer Chair/bed transfer method: Stand pivot, Squat pivot Chair/bed transfer assist level: Touching or steadying assistance (Pt > 75%) Chair/bed transfer assistive device: Armrests Mechanical lift: Stedy Chair/bed transfer details: Verbal cues for technique, Verbal cues for safe use of DME/AE, Tactile cues for placement, Tactile cues for weight shifting, Tactile cues for sequencing, Manual facilitation for weight shifting, Manual facilitation for placement  Function - Locomotion: Wheelchair Will patient use wheelchair at discharge?: Yes Type: Manual Max wheelchair distance: 150 ft  Assist Level: Supervision or verbal cues Assist Level: Supervision or verbal cues Wheel 150 feet activity did not occur: Safety/medical concerns Assist Level:  Supervision or verbal cues Turns around,maneuvers to table,bed, and toilet,negotiates 3% grade,maneuvers on rugs and over doorsills: No Function - Locomotion: Ambulation Assistive device: Walker-rolling, Orthosis(LUE orthosis, LAFO) Max distance: 165' Assist level: Touching or steadying assistance (Pt > 75%) Assist level: Touching or steadying assistance (Pt > 75%) Walk 50 feet with 2 turns activity did not occur: Safety/medical concerns Assist level: Touching or steadying assistance (Pt > 75%) Walk 150 feet activity did not occur: Safety/medical concerns Assist level: Touching or steadying assistance (Pt > 75%) Walk 10 feet on uneven surfaces activity did not occur: Safety/medical concerns Assist level: Touching or steadying assistance (Pt > 75%)  Function - Comprehension Comprehension: Auditory Comprehension assist level: Follows complex conversation/direction with no assist  Function - Expression Expression: Verbal Expression assist level: Expresses complex ideas: With no assist  Function - Social Interaction Social Interaction assist level: Interacts appropriately with others with medication or extra time (anti-anxiety, antidepressant).  Function - Problem Solving Problem solving assist level: Solves complex problems: With extra time  Function - Memory Memory assist level: Complete Independence: No helper Patient normally able to recall (first 3 days only): Current season, That he or she is in a hospital, Location of own room, Staff names and faces  Medical Problem List and Plan: 1. Decline in functional status due to resultant left-sided weakness with sensory deficits and left homonymous  hemianopsia secondary to large Right occipitoparietal ICH  Cont CIR PT, OT SLP  -SNF placement on Monday .2.  DVT Prophylaxis/Anticoagulation: Mechanical: Sequential compression devices, below knee Bilateral lower extremities 3. Pain Management: Continue Tylenol as needed for headaches,  quad soreness left due to exercise no adductor or hamstring soreness  4. Mood: LCSW to follow for evaluation and support, neuropsych to follow , on Zoloft 27m Slept well 5. Neuropsych: This patient is capable of making decisions on her own behalf. 6. Skin/Wound Care: Routine pressure relief measures 7. Fluids/Electrolytes/Nutrition: Continue to encourage p.o. intake  8. HTN: Monitor blood pressure twice daily   BP's controlled 9/30 Vitals:   08/15/18 1949 08/16/18 0414  BP: (!) 137/59 (!) 126/57  Pulse: 66 (!) 58  Resp: 18 18  Temp: 98.3 F (36.8 C) 98.2 F (36.8 C)  SpO2: 96% 96%  9.  PAF: Monitor heart rate twice daily-- appears in NSR on Tambocor and metoprolol twice daily.   HR controlled on lower dose metoprolol 10. Constipation: senna 2 po qhs 11.  Urinary retention: resolved    12.  Spasticity improved , off tizanidine      LOS (Days) 28 A FACE TO FACE EVALUATION WAS PERFORMED  ACharlett Blake9/30/2019, 7:40 AM

## 2018-08-16 NOTE — Progress Notes (Signed)
Report called to SNF. Patient transported by [facility]. No pain, no questions at this time. All belongings accounted for.

## 2018-08-16 NOTE — Progress Notes (Signed)
Occupational Therapy Discharge Summary  Patient Details  Name: DOREATHA OFFER MRN: [id number] Date of Birth: 1946/01/17  Patient has met 12 of 12 long term goals due to improved activity tolerance, improved balance, postural control, ability to compensate for deficits, functional use of  LEFT upper and LEFT lower extremity, improved attention, improved awareness and improved coordination.  Patient to discharge at [facility] Assist level. Patient has made tremendous improvements from evaluation to discharge.  Progressing from +2 for transfers and self-care tasks to min assist/contact guard for transfers and self-care tasks at sit > stand level.  Pt has progressed from flaccid LUE to shoulder flexion 120* against gravity and utilizing LUE to assist with grooming and self-feeding tasks.  Patient's care partner unavailable to provide the necessary physical assistance at discharge.    Reasons goals not met: N/A  Recommendation:  Patient will benefit from ongoing skilled OT services in skilled nursing facility setting to continue to advance functional skills in the area of BADL, iADL and Reduce care partner burden.  Equipment: No equipment provided  Reasons for discharge: treatment goals met and discharge from hospital  Patient/family agrees with progress made and goals achieved: Yes  OT Discharge Precautions/Restrictions  Precautions Precautions: Fall Pain Pain Assessment Pain Score: Asleep ADL  See Function Navigator Vision Baseline Vision/History: Wears glasses Wears Glasses: At all times Patient Visual Report: Peripheral vision impairment Vision Assessment?: Yes Eye Alignment: Within Functional Limits Ocular Range of Motion: Within Functional Limits Alignment/Gaze Preference: Within Defined Limits Tracking/Visual Pursuits: Able to track stimulus in all quads without difficulty Saccades: Within functional limits Perception  Perception: Impaired Inattention/Neglect: Does not  attend to left visual field;Does not attend to left side of body(mild impairment, much improved from evaluation) Cognition Overall Cognitive Status: Impaired/Different from baseline Arousal/Alertness: Awake/alert Orientation Level: Oriented X4 Attention: Selective Selective Attention: Appears intact Memory: Impaired Memory Impairment: Decreased recall of new information Awareness: Impaired Awareness Impairment: Anticipatory impairment Problem Solving: Impaired Problem Solving Impairment: Functional complex Safety/Judgment: Impaired Sensation Sensation Light Touch: Appears Intact Proprioception: Impaired by gross assessment Proprioception Impaired Details: Impaired LLE;Impaired LUE Coordination Gross Motor Movements are Fluid and Coordinated: No Fine Motor Movements are Fluid and Coordinated: No Finger Nose Finger Test: mild difficulty with LUE, slow and deliberate.  However much improved as flaccid on eval 9 Hole Peg Test: Rt: 26 seconds and Lt: 1:28 (flaccid on eval) Extremity/Trunk Assessment RUE Assessment RUE Assessment: Within Functional Limits LUE Assessment LUE Assessment: Exceptions to [facility] Passive Range of Motion (PROM) Comments: WNL Active Range of Motion (AROM) Comments: shoulder flexion 120* against gravity, elbow flexion WNL, mild decrease in elbow extension due to tone, wrist extension mild deficit due to tone General Strength Comments: strength grossly 3/5 biceps/triceps and grip LUE Body System: Neuro Brunstrum levels for arm and hand: Arm;Hand Brunstrum level for arm: Stage IV Movement is deviating from synergy Brunstrum level for hand: Stage V Independence from basic synergies   See Function Navigator for Current Functional Status.  [name] 08/16/2018, 8:24 AM

## 2018-08-16 NOTE — Discharge Summary (Signed)
Physician Discharge Summary  Patient ID: [name] MRN: [id number] DOB/AGE: [birth year] 72 y.o.  Admit date: 07/19/2018 Discharge date: 08/16/2018  Discharge Diagnoses:  Principal Problem:   Intracerebral hemorrhage ([place]) Active Problems:   Paroxysmal atrial fibrillation (HCC)   Hypertension   Constipation   Current mild episode of major depressive disorder (HCC)   Spastic hemiparesis (HCC)   Discharged Condition: stable   Significant Diagnostic Studies: Dg Wrist Complete Right  Result Date: 08/05/2018 CLINICAL DATA:  The patient fell from a wheelchair this morning and is complaining of right wrist pain. History of previous right wrist fracture treated with ORIF in May of 2006. EXAM: RIGHT WRIST - COMPLETE 3+ VIEW COMPARISON:  Preoperative right wrist radiographs of March 15, 2005. FINDINGS: The patient has undergone ORIF for a comminuted intra-articular fracture of the distal radial metaphysis. The metallic side plate appears to be in good position. There is no acute Ree fracture of the distal radius. The adjacent ulna is intact. The carpal bones and intercarpal joint spaces appear normal as do the metacarpals and CMC joints. The soft tissues exhibit no acute abnormalities. IMPRESSION: There is no acute right wrist fracture. The previously fractured distal right radius and the orthopedic hardware appear normal. Electronically Signed   By: David  [name] M.D.   On: 08/05/2018 13:25   Ct Head Wo Contrast  Result Date: 08/05/2018 CLINICAL DATA:  Fall from wheelchair today. No loss of consciousness. History of hypertension. EXAM: CT HEAD WITHOUT CONTRAST TECHNIQUE: Contiguous axial images were obtained from the base of the skull through the vertex without intravenous contrast. COMPARISON:  CT HEAD July 16, 2018 FINDINGS: BRAIN: Residual RIGHT parietal hematoma with extensive vasogenic edema resulting in regional mass effect. 4 mm RIGHT-to-LEFT midline shift, relatively unchanged. No  parenchymal brain volume loss for age. No acute large vascular territory infarcts. No abnormal extra-axial fluid collections. Basal cisterns are patent. VASCULAR: Moderate calcific atherosclerosis of the carotid siphons. SKULL: No skull fracture. Old nasal bone fractures. No significant scalp soft tissue swelling. SINUSES/ORBITS: Trace paranasal sinus mucosal thickening. Minimal LEFT mastoid effusion.The included ocular globes and orbital contents are non-suspicious. OTHER: None. IMPRESSION: 1. No acute intracranial process. 2. Evolving subacute RIGHT parietal hematoma with regional mass effect and similar 4 mm RIGHT to LEFT midline shift. No rebleed. Electronically Signed   By: Elon Alas M.D.   On: 08/05/2018 14:15   Dg Shoulder Left  Result Date: 08/05/2018 CLINICAL DATA:  The patient fell from a wheelchair this morning and is complaining of mild left shoulder pain. EXAM: LEFT SHOULDER - 2+ VIEW COMPARISON:  Limited views of the left shoulder from a chest x-ray of July 13, 2018 FINDINGS: The bones are subjectively adequately mineralized. The joint spaces are well maintained. There is no acute fracture or dislocation. The observed portions of the upper left ribs are normal. IMPRESSION: There is no acute bony abnormality of the left shoulder. Electronically Signed   By: David  [name] M.D.   On: 08/05/2018 13:22   Dg Femur Min 2 Views Left  Result Date: 08/05/2018 CLINICAL DATA:  Patient fell from a wheelchair this morning. The patient is complaining of left femur pain. EXAM: LEFT FEMUR 2 VIEWS COMPARISON:  Coronal and sagittal reconstructed images through the pelvis and left hip dated October 21, 2017 FINDINGS: The bones are subjectively adequately mineralized. There is no lytic or blastic lesion of the femur. There is minimal narrowing of the left hip joint space. The knee exhibits no acute abnormality. The soft  tissues are unremarkable. IMPRESSION: There is no acute bony abnormality of the left  femur. There is mild degenerative narrowing of the left hip joint space. Electronically Signed   By: David  [name] M.D.   On: 08/05/2018 13:24    Labs:  Basic Metabolic Panel: BMP Latest Ref Rng & Units 08/16/2018 08/09/2018 08/02/2018  Glucose 70 - 99 mg/dL 113(H) 102(H) 113(H)  BUN 8 - 23 mg/dL 21 19 16   Creatinine 0.44 - 1.00 mg/dL 1.08(H) 0.98 0.95  Sodium 135 - 145 mmol/L 139 139 140  Potassium 3.5 - 5.1 mmol/L 4.3 4.3 4.4  Chloride 98 - 111 mmol/L 106 104 102  CO2 22 - 32 mmol/L 26 25 28   Calcium 8.9 - 10.3 mg/dL 10.1 10.3 10.6(H)    CBC: CBC Latest Ref Rng & Units 08/09/2018 08/02/2018 07/26/2018  WBC 4.0 - 10.5 K/uL 5.7 5.8 8.6  Hemoglobin 12.0 - 15.0 g/dL 13.8 14.4 15.0  Hematocrit 36.0 - 46.0 % 40.8 43.9 45.2  Platelets 150 - 400 K/uL 203 253 283    CBG: No results for input(s): GLUCAP in the last 168 hours.  Brief HPI:   Jamiah Homeyer. [name] is a 72 year old right-handed female with history of hypertension, PAF, obesity who was admitted on 07/13/2018 with progressive HA, nausea and vomiting and left-sided weakness.  CT of head showed right parietal lobe hematoma with small subarachnoid hemorrhage and saw and subdural hemorrhage.  Dr. Trenton Gammon felt that hemorrhage was due to hypertension and recommended monitoring with serial CT.  MRI/MRA brain on 8/28 revealed stable hematoma right parietal lobe with stable mass-effect.  Aspirin was discontinued and elevated blood pressure treated with Cardene drip.    Repeat CT head of 8/30 showed stable hematoma with mild increase in edema and associated mass-effect.  Dr. Erlinda Hong recommended repeating MRI/MRA brain with ICH resolves to rule out malignancy, AVM or aneurysm.  Aspirin was resumed at discharge.  Foley placed due to problems with urinary retention and need for in and out caths.  Patient with resultant left-sided weakness with sensory deficits, and left homonymous hemianopsia affecting ADLs and mobility.  CIR was recommended due to functional  deficits   Hospital Course: JACINTA PENALVER was admitted to rehab 07/19/2018 for inpatient therapies to consist of PT, ST and OT at least three hours five days a week. Past admission physiatrist, therapy team and rehab RN have worked together to provide customized collaborative inpatient rehab.  Blood pressures have been monitored closely and she has had issues with  orthostatic symptoms at admission.  Abdominal binder and teds were all ordered for support.  Her blood pressure started trending with increase in activityand BP meds were titrated upwards for tighter control.  However she had recurrent orthostatic symptoms therefore tight controlled not recommended.  Resting SBP needs to run 130s to 150s range to avoid symptoms.  She continues in normal sinus rhythm onTambocor and low-dose metoprolol.   Foley was removed on 09/3 and bladder training was initiated.  She has had problems with urinary retention requiring in and out catheterizations.  Urine culture done showed > 100,000 colonies E. coli and she was treated 10-day course of Macrodantin for this.  Urecholine was used briefly to help with voiding function and has been weaned off as retention resolved.  She is currently voiding without difficulty.  She is continent of bowel and bladder. Constipation has been managed with addition of senna at bedtime.  Neuropsych/Dr. Sima Matas  was consulted for input mood and patient reported recent increase  in depressive symptoms due to multiple psychosocial issues. Egp support provided by team and mood is currently stable on Zoloft.    Tizanidine was added to help manage spasticity but she was unable to tolerate higher doses due to blurry vision as side effect.  Left hamstring was injected with Botox due to severe spasticity spasticity.  She has had improvement in motor return left upper extremity and tizanidine was discontinued due  to recurrent hypotension. Her spasticity is improving with post botox,  ROM as well  as some motor return LUE.   P.o. intake is improved and serial check of BMP shows renal status is stable.  She is tolerating low dose ASA without side effects and follow-up CBC shows H&H is stable overall. Reactive leukocytosis has resolved.  She has been making steady progress however continues to require assistance.  Family is unable to provide care needed and has elected on progressive therapy at SNF.  She was discharged to [facility] on 08/16/18.    Rehab course: During patient's stay in rehab weekly team conferences were held to monitor patient's progress, set goals and discuss barriers to discharge. At admission, patient required +2 max assist and cues for sequencing with morbid for mobility.  She required total assist for basic self-care task.  Cognitive evaluation revealed mild deficits affecting attention recall awareness and executive functioning --MoCA score 24/30.  She  has had improvement in activity tolerance, balance, postural control as well as ability to compensate for deficits. She has had improvement in functional use LUE  and LLE as well as improvement in awareness. She is able to bathe herself while seated with supervision.  She requires set up to supervision for upper body dressing and steady assist for lower body care.  She requires supervision for wheelchair mobility, min assist for transfers and min assist to ambulate 165 feet with rolling walker, left-AFO, left- toe cap and left hand orthosis. Cognition has improved to modified independent to supervision level. She is able to complete complex tasks with extra time.     Disposition: [place].   Diet: Heart Healthy.   Special Instructions: 1. Will need repeat MRI brain in few weeks for follow up films post bleed.  2. Continue to monitor for orthostatic symptoms.   Discharge Instructions    Ambulatory referral to Physical Medicine Rehab   Complete by:  As directed    4-6 weeks follow up appointment      Allergies as of 08/16/2018      Reactions   Penicillins    Passed out Has patient had a PCN reaction causing immediate rash, facial/tongue/throat swelling, SOB or lightheadedness with hypotension: Yes Has patient had a PCN reaction causing severe rash involving mucus membranes or skin necrosis: No Has patient had a PCN reaction that required hospitalization: No Has patient had a PCN reaction occurring within the last 10 years: No If all of the above answers are "NO", then may proceed with Cephalosporin use.   Tape Rash   Medical tape did this      Medication List    STOP taking these medications   ciprofloxacin 500 MG tablet Commonly known as:  CIPRO   Fish Oil 1000 MG Caps   HYDROcodone-acetaminophen 5-325 MG tablet Commonly known as:  NORCO/VICODIN   ondansetron 4 MG disintegrating tablet Commonly known as:  ZOFRAN-ODT     TAKE these medications   acetaminophen 500 MG tablet Commonly known as:  TYLENOL Take 500 mg by mouth every 6 (six)  hours as needed for mild pain.   aspirin 81 MG tablet Take 81 mg by mouth every evening.   benazepril 10 MG tablet Commonly known as:  LOTENSIN Take 1 tablet (10 mg total) by mouth 2 (two) times daily.   cimetidine 400 MG tablet Commonly known as:  TAGAMET Take 200 mg by mouth at bedtime.   flecainide 50 MG tablet Commonly known as:  TAMBOCOR Take 1 tablet (50 mg total) by mouth at bedtime. What changed:  See the new instructions.   flecainide 100 MG tablet Commonly known as:  TAMBOCOR Take 1 tablet (100 mg total) by mouth daily. What changed:  You were already taking a medication with the same name, and this prescription was added. Make sure you understand how and when to take each.   gabapentin 100 MG capsule Commonly known as:  NEURONTIN Take 1 capsule (100 mg total) by mouth at bedtime.   metoprolol tartrate 25 MG tablet Commonly known as:  LOPRESSOR Take 0.5 tablets (12.5 mg total) by mouth 2 (two) times  daily. What changed:  how much to take   nitroGLYCERIN 0.4 MG SL tablet Commonly known as:  NITROSTAT Place 1 tablet (0.4 mg total) under the tongue every 5 (five) minutes as needed for chest pain (x 3 doses).   pantoprazole 40 MG tablet Commonly known as:  PROTONIX Take 1 tablet (40 mg total) by mouth daily.   polyethylene glycol packet Commonly known as:  MIRALAX / GLYCOLAX Take 17 g by mouth daily as needed for mild constipation.   senna-docusate 8.6-50 MG tablet Commonly known as:  Senokot-S Take 2 tablets by mouth at bedtime.   sertraline 50 MG tablet Commonly known as:  ZOLOFT Take 1 tablet (50 mg total) by mouth at bedtime. What changed:    medication strength  how much to take  when to take this   VITAMIN B 12 PO Take 1,000 mcg by mouth daily.   zolpidem 5 MG tablet--Rx # 5 pills Commonly known as:  AMBIEN Take 0.5 tablets (2.5 mg total) by mouth at bedtime as needed for sleep.       Contact information for follow-up providers    Kirsteins, Luanna Salk, MD Follow up.   Specialty:  Physical Medicine and Rehabilitation Why:  office will call you with follow up appointment Contact information: [address] [place] [address] [phone]        Garvin Fila, MD. Call.   Specialties:  Neurology, Radiology Why:  today for follow up appointment in 4 weeks.  Contact information: [address] [place] [address] [phone]        Sanda Klein, MD. Call.   Specialty:  Cardiology Why:  for follow up appointment Contact information: [address] [place] [address] [phone]            Contact information for after-discharge care    Destination    [facility] .   Service:  Skilled Nursing Contact information: [place] [phone]                  Signed: Bary Leriche 08/16/2018, 9:55 AM

## 2018-08-16 NOTE — Progress Notes (Signed)
Social Work  Discharge Note  The overall goal for the admission was met for:   Discharge location: [facility]  Length of Stay: Yes-28 DAYS  Discharge activity level: Yes-MIN ASSIST LEVEL  Home/community participation: Yes  Services provided included: MD, RD, PT, OT, SLP, RN, CM, TR, Pharmacy, Neuropsych and SW  Financial Services: Medicare and Private Insurance: [place]  Follow-up services arranged: Other: SHORT TERM NHP  Comments (or additional information):PT NEEDS MORE [place]. HAAS DONE WELL WITH THERAPIES AND PROGRESS.  Patient/Family verbalized understanding of follow-up arrangements: Yes  Individual responsible for coordination of the follow-up plan: [facility] AND [facility]  Confirmed correct DME delivered: Elease Hashimoto 08/16/2018    Elease Hashimoto

## 2018-08-17 DIAGNOSIS — I480 Paroxysmal atrial fibrillation: Secondary | ICD-10-CM | POA: Diagnosis not present

## 2018-08-17 DIAGNOSIS — I69254 Hemiplegia and hemiparesis following other nontraumatic intracranial hemorrhage affecting left non-dominant side: Secondary | ICD-10-CM | POA: Diagnosis not present

## 2018-08-17 DIAGNOSIS — I69122 Dysarthria following nontraumatic intracerebral hemorrhage: Secondary | ICD-10-CM | POA: Diagnosis not present

## 2018-08-17 DIAGNOSIS — I951 Orthostatic hypotension: Secondary | ICD-10-CM | POA: Diagnosis not present

## 2018-08-17 DIAGNOSIS — I619 Nontraumatic intracerebral hemorrhage, unspecified: Secondary | ICD-10-CM | POA: Diagnosis not present

## 2018-08-17 DIAGNOSIS — R261 Paralytic gait: Secondary | ICD-10-CM | POA: Diagnosis not present

## 2018-08-17 DIAGNOSIS — I10 Essential (primary) hypertension: Secondary | ICD-10-CM | POA: Diagnosis not present

## 2018-08-18 DIAGNOSIS — R51 Headache: Secondary | ICD-10-CM | POA: Diagnosis not present

## 2018-08-18 DIAGNOSIS — I619 Nontraumatic intracerebral hemorrhage, unspecified: Secondary | ICD-10-CM | POA: Diagnosis not present

## 2018-08-18 DIAGNOSIS — F329 Major depressive disorder, single episode, unspecified: Secondary | ICD-10-CM | POA: Diagnosis not present

## 2018-08-18 DIAGNOSIS — I10 Essential (primary) hypertension: Secondary | ICD-10-CM | POA: Diagnosis not present

## 2018-08-19 DIAGNOSIS — I69122 Dysarthria following nontraumatic intracerebral hemorrhage: Secondary | ICD-10-CM | POA: Diagnosis not present

## 2018-08-19 DIAGNOSIS — I69254 Hemiplegia and hemiparesis following other nontraumatic intracranial hemorrhage affecting left non-dominant side: Secondary | ICD-10-CM | POA: Diagnosis not present

## 2018-08-19 DIAGNOSIS — R261 Paralytic gait: Secondary | ICD-10-CM | POA: Diagnosis not present

## 2018-08-19 DIAGNOSIS — M6249 Contracture of muscle, multiple sites: Secondary | ICD-10-CM | POA: Diagnosis not present

## 2018-08-23 DIAGNOSIS — R261 Paralytic gait: Secondary | ICD-10-CM | POA: Diagnosis not present

## 2018-08-23 DIAGNOSIS — I69254 Hemiplegia and hemiparesis following other nontraumatic intracranial hemorrhage affecting left non-dominant side: Secondary | ICD-10-CM | POA: Diagnosis not present

## 2018-08-23 DIAGNOSIS — I69122 Dysarthria following nontraumatic intracerebral hemorrhage: Secondary | ICD-10-CM | POA: Diagnosis not present

## 2018-08-23 DIAGNOSIS — M6249 Contracture of muscle, multiple sites: Secondary | ICD-10-CM | POA: Diagnosis not present

## 2018-08-27 DIAGNOSIS — I69122 Dysarthria following nontraumatic intracerebral hemorrhage: Secondary | ICD-10-CM | POA: Diagnosis not present

## 2018-08-27 DIAGNOSIS — I69254 Hemiplegia and hemiparesis following other nontraumatic intracranial hemorrhage affecting left non-dominant side: Secondary | ICD-10-CM | POA: Diagnosis not present

## 2018-08-27 DIAGNOSIS — M6249 Contracture of muscle, multiple sites: Secondary | ICD-10-CM | POA: Diagnosis not present

## 2018-08-27 DIAGNOSIS — R261 Paralytic gait: Secondary | ICD-10-CM | POA: Diagnosis not present

## 2018-08-31 DIAGNOSIS — M792 Neuralgia and neuritis, unspecified: Secondary | ICD-10-CM | POA: Diagnosis not present

## 2018-08-31 DIAGNOSIS — I10 Essential (primary) hypertension: Secondary | ICD-10-CM | POA: Diagnosis not present

## 2018-08-31 DIAGNOSIS — I619 Nontraumatic intracerebral hemorrhage, unspecified: Secondary | ICD-10-CM | POA: Diagnosis not present

## 2018-08-31 DIAGNOSIS — I951 Orthostatic hypotension: Secondary | ICD-10-CM | POA: Diagnosis not present

## 2018-09-02 DIAGNOSIS — I69122 Dysarthria following nontraumatic intracerebral hemorrhage: Secondary | ICD-10-CM | POA: Diagnosis not present

## 2018-09-02 DIAGNOSIS — I69154 Hemiplegia and hemiparesis following nontraumatic intracerebral hemorrhage affecting left non-dominant side: Secondary | ICD-10-CM | POA: Diagnosis not present

## 2018-09-03 DIAGNOSIS — I69122 Dysarthria following nontraumatic intracerebral hemorrhage: Secondary | ICD-10-CM | POA: Diagnosis not present

## 2018-09-03 DIAGNOSIS — I69154 Hemiplegia and hemiparesis following nontraumatic intracerebral hemorrhage affecting left non-dominant side: Secondary | ICD-10-CM | POA: Diagnosis not present

## 2018-09-07 DIAGNOSIS — I69154 Hemiplegia and hemiparesis following nontraumatic intracerebral hemorrhage affecting left non-dominant side: Secondary | ICD-10-CM | POA: Diagnosis not present

## 2018-09-07 DIAGNOSIS — I69122 Dysarthria following nontraumatic intracerebral hemorrhage: Secondary | ICD-10-CM | POA: Diagnosis not present

## 2018-09-08 DIAGNOSIS — I10 Essential (primary) hypertension: Secondary | ICD-10-CM | POA: Diagnosis not present

## 2018-09-08 DIAGNOSIS — I480 Paroxysmal atrial fibrillation: Secondary | ICD-10-CM | POA: Diagnosis not present

## 2018-09-08 DIAGNOSIS — I129 Hypertensive chronic kidney disease with stage 1 through stage 4 chronic kidney disease, or unspecified chronic kidney disease: Secondary | ICD-10-CM | POA: Diagnosis not present

## 2018-09-08 DIAGNOSIS — I69122 Dysarthria following nontraumatic intracerebral hemorrhage: Secondary | ICD-10-CM | POA: Diagnosis not present

## 2018-09-08 DIAGNOSIS — I69154 Hemiplegia and hemiparesis following nontraumatic intracerebral hemorrhage affecting left non-dominant side: Secondary | ICD-10-CM | POA: Diagnosis not present

## 2018-09-08 DIAGNOSIS — Z79899 Other long term (current) drug therapy: Secondary | ICD-10-CM | POA: Diagnosis not present

## 2018-09-08 DIAGNOSIS — N183 Chronic kidney disease, stage 3 (moderate): Secondary | ICD-10-CM | POA: Diagnosis not present

## 2018-09-09 DIAGNOSIS — I69122 Dysarthria following nontraumatic intracerebral hemorrhage: Secondary | ICD-10-CM | POA: Diagnosis not present

## 2018-09-09 DIAGNOSIS — I69154 Hemiplegia and hemiparesis following nontraumatic intracerebral hemorrhage affecting left non-dominant side: Secondary | ICD-10-CM | POA: Diagnosis not present

## 2018-09-14 ENCOUNTER — Encounter: Payer: Self-pay | Admitting: Physical Medicine & Rehabilitation

## 2018-09-14 ENCOUNTER — Encounter: Payer: Medicare Other | Attending: Physical Medicine & Rehabilitation

## 2018-09-14 ENCOUNTER — Ambulatory Visit (HOSPITAL_BASED_OUTPATIENT_CLINIC_OR_DEPARTMENT_OTHER): Payer: Medicare Other | Admitting: Physical Medicine & Rehabilitation

## 2018-09-14 VITALS — BP 130/66 | HR 60 | Ht 66.0 in | Wt 201.0 lb

## 2018-09-14 DIAGNOSIS — I611 Nontraumatic intracerebral hemorrhage in hemisphere, cortical: Secondary | ICD-10-CM | POA: Insufficient documentation

## 2018-09-14 DIAGNOSIS — I612 Nontraumatic intracerebral hemorrhage in hemisphere, unspecified: Secondary | ICD-10-CM

## 2018-09-14 DIAGNOSIS — E785 Hyperlipidemia, unspecified: Secondary | ICD-10-CM | POA: Diagnosis not present

## 2018-09-14 DIAGNOSIS — I69154 Hemiplegia and hemiparesis following nontraumatic intracerebral hemorrhage affecting left non-dominant side: Secondary | ICD-10-CM | POA: Diagnosis not present

## 2018-09-14 DIAGNOSIS — H539 Unspecified visual disturbance: Secondary | ICD-10-CM

## 2018-09-14 DIAGNOSIS — I69122 Dysarthria following nontraumatic intracerebral hemorrhage: Secondary | ICD-10-CM | POA: Diagnosis not present

## 2018-09-14 DIAGNOSIS — Z87891 Personal history of nicotine dependence: Secondary | ICD-10-CM | POA: Insufficient documentation

## 2018-09-14 DIAGNOSIS — I639 Cerebral infarction, unspecified: Secondary | ICD-10-CM

## 2018-09-14 DIAGNOSIS — I480 Paroxysmal atrial fibrillation: Secondary | ICD-10-CM

## 2018-09-14 DIAGNOSIS — I69398 Other sequelae of cerebral infarction: Secondary | ICD-10-CM

## 2018-09-14 DIAGNOSIS — I10 Essential (primary) hypertension: Secondary | ICD-10-CM | POA: Insufficient documentation

## 2018-09-14 NOTE — Patient Instructions (Signed)
First see Eye dr If vision ok then would follow return to driving  Graduated return to driving instructions were provided. It is recommended that the patient first drives with another licensed driver in an empty parking lot. If the patient does well with this, and they can drive on a quiet street with the licensed driver. If the patient does well with this they can drive on a busy street with a licensed driver. If the patient does well with this, the next time out they can go by himself. For the first month after resuming driving, I recommend no nighttime or Interstate driving.

## 2018-09-14 NOTE — Progress Notes (Signed)
Subjective:    Patient ID: [name], female    DOB: Jul 10, 1946, 72 y.o.   MRN: [id number] 72 year old right-handed female with history of hypertension, PAF, obesity who was admitted on 07/13/2018 with progressive HA, nausea and vomiting and left-sided weakness.  CT of head showed right parietal lobe hematoma with small subarachnoid hemorrhage and saw and subdural hemorrhage.  Dr. Trenton Gammon felt that hemorrhage was due to hypertension and recommended monitoring with serial CT.  MRI/MRA brain on 8/28 revealed stable hematoma right parietal lobe with stable mass-effect.  Aspirin was discontinued and elevated blood pressure treated with Cardene drip.     Repeat CT head of 8/30 showed stable hematoma with mild increase in edema and associated mass-effect.  Dr. Erlinda Hong recommended repeating MRI/MRA brain with ICH resolves to rule out malignancy, AVM or aneurysm.  Aspirin was resumed at discharge.  Foley placed due to problems with urinary retention and need for in and out caths.  Patient with resultant left-sided weakness with sensory deficits, and left homonymous hemianopsia affecting ADLs and mobility  HPI Off Ibuprofen  Mod I dressing and bathing No assistive device except on grass Pt feels vision doing better Fatigues easily  D/C from [facility] ~2wks ago Receiving HHPT and OT  Dr Felipa Eth is PCP has been seen in f/u Pain Inventory Average Pain 1 Pain Right Now 0 My pain is dull  In the last 24 hours, has pain interfered with the following? General activity 0 Relation with others 0 Enjoyment of life 0 What TIME of day is your pain at its worst? morning Sleep (in general) Fair  Pain is worse with: na Pain improves with: na Relief from Meds: na  Mobility walk without assistance walk with assistance use a cane ability to climb steps?  yes do you drive?  no  Function retired  Neuro/Psych depression  Prior Studies Any changes since last visit?  no  Physicians  involved in your care Any changes since last visit?  no   Family History  Problem Relation Age of Onset  . Alzheimer's disease Mother   . Heart disease Father   . Stroke Sister   . Lung cancer Brother   . Cancer Brother    Social History   Socioeconomic History  . Marital status: Married    Spouse name: Not on file  . Number of children: Not on file  . Years of education: Not on file  . Highest education level: Not on file  Occupational History  . Not on file  Social Needs  . Financial resource strain: Not on file  . Food insecurity:    Worry: Not on file    Inability: Not on file  . Transportation needs:    Medical: Not on file    Non-medical: Not on file  Tobacco Use  . Smoking status: Former Research scientist (life sciences)  . Smokeless tobacco: Never Used  Substance and Sexual Activity  . Alcohol use: No  . Drug use: No  . Sexual activity: Yes  Lifestyle  . Physical activity:    Days per week: Not on file    Minutes per session: Not on file  . Stress: Not on file  Relationships  . Social connections:    Talks on phone: Not on file    Gets together: Not on file    Attends religious service: Not on file    Active member of club or organization: Not on file    Attends meetings of clubs or organizations: Not on  file    Relationship status: Not on file  Other Topics Concern  . Not on file  Social History Narrative  . Not on file   Past Surgical History:  Procedure Laterality Date  . +    . ABDOMINAL HYSTERECTOMY    . ANKLE SURGERY    . CARDIOVASCULAR STRESS TEST  07/21/2007   EF 80%, NORMAL NONDIAGONISTIC FOR ISCHEMIA  . STRESS ECHO TEST  07/25/2010   EF 60%  . WRIST SURGERY     RIGHT WRIST   Past Medical History:  Diagnosis Date  . Dyslipidemia   . GERD (gastroesophageal reflux disease)   . High risk medication use    on Flecainide since January of 2013  . Hypertension   . Obesity   . PAF (paroxysmal atrial fibrillation) (HCC)    normal stress echo in 2011   Ht 5\' 6"   (1.676 m)   Wt 201 lb (91.2 kg)   BMI 32.44 kg/m   Opioid Risk Score:   Fall Risk Score:  `1  Depression screen PHQ 2/9  No flowsheet data found.   Review of Systems  Constitutional: Negative.   HENT: Negative.   Eyes: Negative.   Respiratory: Negative.   Cardiovascular: Negative.   Gastrointestinal: Negative.   Endocrine: Negative.   Genitourinary: Negative.   Musculoskeletal: Positive for arthralgias and myalgias.  Skin: Negative.   Allergic/Immunologic: Negative.   Neurological: Negative.   Hematological: Negative.   Psychiatric/Behavioral: Positive for dysphoric mood.  All other systems reviewed and are negative.      Objective:   Physical Exam  Constitutional: She is oriented to person, place, and time. She appears well-developed and well-nourished.  HENT:  Head: Normocephalic and atraumatic.  Eyes: Pupils are equal, round, and reactive to light. EOM are normal.  Cardiovascular: Normal rate, regular rhythm and normal heart sounds.  Pulmonary/Chest: Effort normal and breath sounds normal. No respiratory distress.  Musculoskeletal: Normal range of motion.  Neurological: She is alert and oriented to person, place, and time.  Skin: Skin is warm and dry.  Psychiatric: She has a normal mood and affect.  Nursing note and vitals reviewed. Visual fields are intact confrontation testing Motor strength is 5/5 bilateral hip flexor knee extensor ankle dorsiflexor plantar flexor deltoid biceps and grip Ambulates without assistive device no evidence of toe drag or knee instability.  Mildly wide-based support.  Unable to do tandem gait.  Is able to Do Toe Walk and Heel Walk        Assessment & Plan:  1.  History of right parietal lobe hemorrhage with excellent recovery.  She has mild gait disorder but otherwise has regained full independence with her ADLs.  Her visual field appears to be recovering as well. She has not had visual evaluation in quite some time and given her  additional stroke related problems would recommend neuro-ophthalmology evaluation prior to driving. Do not think she will need outpatient therapy  As recommended by neurology will need neuro follow-up as outpatient and would likely need repeat MRI as per neuro.  Physical medicine rehab follow-up on PRN basis

## 2018-09-15 DIAGNOSIS — I69154 Hemiplegia and hemiparesis following nontraumatic intracerebral hemorrhage affecting left non-dominant side: Secondary | ICD-10-CM | POA: Diagnosis not present

## 2018-09-15 DIAGNOSIS — I69122 Dysarthria following nontraumatic intracerebral hemorrhage: Secondary | ICD-10-CM | POA: Diagnosis not present

## 2018-09-16 DIAGNOSIS — I69154 Hemiplegia and hemiparesis following nontraumatic intracerebral hemorrhage affecting left non-dominant side: Secondary | ICD-10-CM | POA: Diagnosis not present

## 2018-09-16 DIAGNOSIS — I69122 Dysarthria following nontraumatic intracerebral hemorrhage: Secondary | ICD-10-CM | POA: Diagnosis not present

## 2018-09-21 DIAGNOSIS — I69154 Hemiplegia and hemiparesis following nontraumatic intracerebral hemorrhage affecting left non-dominant side: Secondary | ICD-10-CM | POA: Diagnosis not present

## 2018-09-21 DIAGNOSIS — I69122 Dysarthria following nontraumatic intracerebral hemorrhage: Secondary | ICD-10-CM | POA: Diagnosis not present

## 2018-09-23 DIAGNOSIS — I69122 Dysarthria following nontraumatic intracerebral hemorrhage: Secondary | ICD-10-CM | POA: Diagnosis not present

## 2018-09-23 DIAGNOSIS — I69154 Hemiplegia and hemiparesis following nontraumatic intracerebral hemorrhage affecting left non-dominant side: Secondary | ICD-10-CM | POA: Diagnosis not present

## 2018-09-28 DIAGNOSIS — I69154 Hemiplegia and hemiparesis following nontraumatic intracerebral hemorrhage affecting left non-dominant side: Secondary | ICD-10-CM | POA: Diagnosis not present

## 2018-09-28 DIAGNOSIS — I69122 Dysarthria following nontraumatic intracerebral hemorrhage: Secondary | ICD-10-CM | POA: Diagnosis not present

## 2018-10-01 NOTE — Progress Notes (Signed)
Cardiology Office Note:    Date:  10/04/2018   ID:  [name], DOB [birth year], MRN [id number]  PCP:  [name], MD  Cardiologist:  Toma Arts [name], MD    Referring MD: [name], MD   Chief complaint: Follow up atrial fibrillation   History of Present Illness:    [name] is a 72 y.o. female with a hx of infrequent episodes of paroxysmal atrial fibrillation, well controlled on a combination of flecainide and metoprolol.  She has no known structural heart disease.  She had a normal stress Echo in 2013 and normal Myoview study in 2008.   She was seen last year by Dr Sallyanne Kuster. Since I followed her husband for years she is seeing me for follow up now.  She was taking aspirin for stroke prevention.  She has never been on true anticoagulants.  She was admitted from 8/27-07/19/18 after she developed acute N/V and left hemiparesis following Colonoscopy. She had been off ASA for a couple of days. She was found to have a left occipital-parietal ICH. ASA was held but resumed at DC. On metoprolol and benazepril for HTN. Echo unremarkable. Continued on Flecainide. No Afib noted. She was on inpatient Rehab until 08/16/18 and then DC to [facility]. She was DC home about 4 weeks ago.   On follow up today she is doing well. States she has completed home Rehab. Still notes some numbness in the left leg and weakness in the left wrist. She is independent except she has not been cleared to drive yet. Denies any palpitations. No dizziness, chest pain or SOB. She quit smoking when she had her stroke. No recurrent Afib to her knowledge. Was confused about her Flecainide dose.   Past Medical History:  Diagnosis Date  . Dyslipidemia   . GERD (gastroesophageal reflux disease)   . High risk medication use    on Flecainide since January of 2013  . Hypertension   . Obesity   . PAF (paroxysmal atrial fibrillation) (HCC)    normal stress echo in 2011    Past Surgical History:    Procedure Laterality Date  . +    . ABDOMINAL HYSTERECTOMY    . ANKLE SURGERY    . CARDIOVASCULAR STRESS TEST  07/21/2007   EF 80%, NORMAL NONDIAGONISTIC FOR ISCHEMIA  . STRESS ECHO TEST  07/25/2010   EF 60%  . WRIST SURGERY     RIGHT WRIST    Current Medications: Current Meds  Medication Sig  . acetaminophen (TYLENOL) 500 MG tablet Take 500 mg by mouth every 6 (six) hours as needed for mild pain.  Marland Kitchen aspirin 81 MG tablet Take 81 mg by mouth every evening.   . benazepril (LOTENSIN) 10 MG tablet Take 1 tablet (10 mg total) by mouth 2 (two) times daily.  . Cyanocobalamin (VITAMIN B 12 PO) Take 1,000 mcg by mouth daily.  . flecainide (TAMBOCOR) 100 MG tablet Take 100 mg by mouth daily with breakfast.  . flecainide (TAMBOCOR) 50 MG tablet Take 1 tablet (50 mg total) by mouth at bedtime.  . gabapentin (NEURONTIN) 100 MG capsule Take 1 capsule (100 mg total) by mouth at bedtime.  . metoprolol tartrate (LOPRESSOR) 25 MG tablet Take 0.5 tablets (12.5 mg total) by mouth 2 (two) times daily.  . nitroGLYCERIN (NITROSTAT) 0.4 MG SL tablet Place 1 tablet (0.4 mg total) under the tongue every 5 (five) minutes as needed for chest pain (x 3 doses).  . pantoprazole (PROTONIX) 40 MG tablet  Take 1 tablet (40 mg total) by mouth daily.  . sertraline (ZOLOFT) 50 MG tablet Take 1 tablet (50 mg total) by mouth at bedtime.  . [DISCONTINUED] cimetidine (TAGAMET) 400 MG tablet Take 200 mg by mouth at bedtime.   . [DISCONTINUED] flecainide (TAMBOCOR) 100 MG tablet Take 1 tablet (100 mg total) by mouth daily. (Patient taking differently: Take 100 mg by mouth 2 (two) times daily. )     Allergies:   Penicillins; Codeine; and Tape   Social History   Socioeconomic History  . Marital status: Married    Spouse name: Not on file  . Number of children: Not on file  . Years of education: Not on file  . Highest education level: Not on file  Occupational History  . Not on file  Social Needs  . Financial resource  strain: Not on file  . Food insecurity:    Worry: Not on file    Inability: Not on file  . Transportation needs:    Medical: Not on file    Non-medical: Not on file  Tobacco Use  . Smoking status: Former Research scientist (life sciences)  . Smokeless tobacco: Never Used  Substance and Sexual Activity  . Alcohol use: No  . Drug use: No  . Sexual activity: Yes  Lifestyle  . Physical activity:    Days per week: Not on file    Minutes per session: Not on file  . Stress: Not on file  Relationships  . Social connections:    Talks on phone: Not on file    Gets together: Not on file    Attends religious service: Not on file    Active member of club or organization: Not on file    Attends meetings of clubs or organizations: Not on file    Relationship status: Not on file  Other Topics Concern  . Not on file  Social History Narrative  . Not on file     Family History: The patient's family history includes Alzheimer's disease in her mother; Cancer in her brother; Heart disease in her father; Lung cancer in her brother; Stroke in her sister. ROS:   Please see the history of present illness.     All other systems reviewed and are negative.  EKGs/Labs/Other Studies Reviewed:     Recent Labs: 07/14/2018: TSH 0.688 07/15/2018: Magnesium 1.9 07/20/2018: ALT 59 08/09/2018: Hemoglobin 13.8; Platelets 203 08/16/2018: BUN 21; Creatinine, Ser 1.08; Potassium 4.3; Sodium 139  Recent Lipid Panel    Component Value Date/Time   CHOL 97 07/14/2018 0353   TRIG 110 07/14/2018 0353   HDL 26 (L) 07/14/2018 0353   CHOLHDL 3.7 07/14/2018 0353   VLDL 22 07/14/2018 0353   LDLCALC 49 07/14/2018 0353   Dated 09/08/18: creatinine 1.26. Hgb and potassium normal.  Cardiac studies:  Echo 07/15/18: Study Conclusions  - Left ventricle: The cavity size was normal. There was moderate   concentric hypertrophy. Systolic function was vigorous. The   estimated ejection fraction was in the range of 65% to 70%. Wall   motion was  normal; there were no regional wall motion   abnormalities. - Aortic valve: There was no regurgitation. - Mitral valve: There was mild regurgitation. - Left atrium: The atrium was moderately dilated. - Right ventricle: The cavity size was normal. Wall thickness was   normal. Systolic function was normal. - Right atrium: The atrium was moderately dilated. - Tricuspid valve: There was moderate regurgitation. - Pulmonary arteries: Systolic pressure was moderately increased.  PA peak pressure: 47 mm Hg (S). - Inferior vena cava: The vessel was normal in size.  Impressions:  - No cardiac source of emboli was indentified.  Physical Exam:    VS:  BP 119/61   Pulse 65   Ht 5\' 6"  (1.676 m)   Wt 202 lb 6.4 oz (91.8 kg)   BMI 32.67 kg/m     Wt Readings from Last 3 Encounters:  10/04/18 202 lb 6.4 oz (91.8 kg)  09/14/18 201 lb (91.2 kg)  08/11/18 196 lb 6.9 oz (89.1 kg)     GENERAL:  Well appearing WF in NAD HEENT:  PERRL, EOMI, sclera are clear. Oropharynx is clear. NECK:  No jugular venous distention, carotid upstroke brisk and symmetric, no bruits, no thyromegaly or adenopathy LUNGS:  Clear to auscultation bilaterally CHEST:  Unremarkable HEART:  RRR,  PMI not displaced or sustained,S1 and S2 within normal limits, no S3, no S4: no clicks, no rubs, no murmurs ABD:  Soft, nontender. BS +, no masses or bruits. No hepatomegaly, no splenomegaly EXT:  2 + pulses throughout, no edema, no cyanosis no clubbing SKIN:  Warm and dry.  No rashes NEURO:  Alert and oriented x 3. Cranial nerves II through XII intact. PSYCH:  Cognitively intact    ASSESSMENT:    1. Paroxysmal atrial fibrillation (HCC)   2. Nontraumatic cortical hemorrhage of left cerebral hemisphere ([place])   3. Essential hypertension    PLAN:    1. Paroxysmal Afib. Controlled on Flecainide. Went over dose again which is 100 mg in the am and 50 mg in the Pm.  Not a candidate for anticoagulation  now due to recent  [place] 2. Left parietal ICH 3. HTN. Controlled 4. Tobacco abuse now stopped.  Will continue current therapy. Follow up in one year.  Signed, Filomena Pokorney [name], MD  10/04/2018 8:34 AM    [facility]

## 2018-10-04 ENCOUNTER — Encounter: Payer: Self-pay | Admitting: Cardiology

## 2018-10-04 ENCOUNTER — Ambulatory Visit (INDEPENDENT_AMBULATORY_CARE_PROVIDER_SITE_OTHER): Payer: Medicare Other | Admitting: Cardiology

## 2018-10-04 VITALS — BP 119/61 | HR 65 | Ht 66.0 in | Wt 202.4 lb

## 2018-10-04 DIAGNOSIS — I480 Paroxysmal atrial fibrillation: Secondary | ICD-10-CM | POA: Diagnosis not present

## 2018-10-04 DIAGNOSIS — I611 Nontraumatic intracerebral hemorrhage in hemisphere, cortical: Secondary | ICD-10-CM

## 2018-10-04 DIAGNOSIS — I10 Essential (primary) hypertension: Secondary | ICD-10-CM

## 2018-10-04 DIAGNOSIS — I639 Cerebral infarction, unspecified: Secondary | ICD-10-CM | POA: Diagnosis not present

## 2018-10-04 MED ORDER — FAMOTIDINE 40 MG PO TABS: 40.0000 mg | ORAL_TABLET | Freq: Every day | ORAL | Status: DC

## 2018-10-04 NOTE — Patient Instructions (Signed)
Make sure you are taking Flecainide 100 mg in the morning and 50 mg in the evening.   Congratulations for quitting smoking.   Follow up in one year

## 2018-10-07 ENCOUNTER — Telehealth: Payer: Self-pay | Admitting: Cardiology

## 2018-10-07 NOTE — Telephone Encounter (Signed)
SPOKE WITH PATIENT. RN INFORMED PATIENT PER RECORDS --  BENAZEPRIL 10 MG TWICE A DAY -- NO INDICATION OF CHANGES.  PATIENT STATES WHILE IN THE SKILLED  FACILITY -  WAS GIVEN 10 MG IN THE MORNING AND 15 MG IN THE EVENING. DR Felipa Eth  HAS THE DIRECTIONS OF 10 MG IN THE MORNING AND 15 MG IN THE EVENING.  PATIENT WANTED MEDICATION LIST CHANGED  PRN CHANGED MEDICATION LIST

## 2018-10-07 NOTE — Telephone Encounter (Signed)
Pt c/o medication issue:  1. Name of Medication: benazepril (LOTENSIN) 10 MG tablet  2. How are you currently taking this medication (dosage and times per day)? See below  3. Are you having a reaction (difficulty breathing--STAT)? no  4. What is your medication issue?   Patient states we have her down for two 10mg  a day (one in the morning and one in the evening), but Dr. Felipa Eth has her down for 1 tablet -10mg  in the am, and 3 tablets - 5 mg in the evening.  She just wants some clarification on it.

## 2018-10-20 ENCOUNTER — Ambulatory Visit (INDEPENDENT_AMBULATORY_CARE_PROVIDER_SITE_OTHER): Payer: Medicare Other | Admitting: Neurology

## 2018-10-20 ENCOUNTER — Encounter: Payer: Self-pay | Admitting: Neurology

## 2018-10-20 ENCOUNTER — Telehealth: Payer: Self-pay | Admitting: Neurology

## 2018-10-20 VITALS — BP 145/82 | HR 56 | Ht 66.0 in | Wt 202.0 lb

## 2018-10-20 DIAGNOSIS — I639 Cerebral infarction, unspecified: Secondary | ICD-10-CM

## 2018-10-20 DIAGNOSIS — I611 Nontraumatic intracerebral hemorrhage in hemisphere, cortical: Secondary | ICD-10-CM

## 2018-10-20 NOTE — Telephone Encounter (Signed)
Medicare/cigna order sent to GI. They will reach out to the pt to schedule.  °

## 2018-10-20 NOTE — Progress Notes (Signed)
[facility] [address] [place]. [place] [address] [phone]       OFFICE FOLLOW-UP NOTE  [name] Date of Birth:  07-Jul-1946 Medical Record Number:  [id number]   HPI: 72 year old lady seen today for initial office follow-up visit following hospital admission for intracerebral hemorrhage.  History is obtained from the patient and daughter as well as review of electronic medical records and have personally reviewed imaging films.  [name] is a pleasant 72 year old lady with past medical history of hyperlipidemia, hypertension, obesity, paroxysmal atrial fibrillation who presented to [facility] on 07/13/2018 with several days of headache as well as a few bouts of vomiting.  She underwent colonoscopy on 07/12/2018 and while in the PACU following the procedure she was noted as not moving her left side well.  Her blood pressure has slightly gone up to 173/120 there.  CT scan of the head showed a 4.7 x 4.2 x 5.3 cm hematoma centered in the right parietal lobe with a 10 rim of surrounding edema. and 3 mm midline shift.  She started on Cardene drip and Zofran for vomiting.  NIH stroke scale on admission was 5 with ICH score of 2.  Patient was admitted to the intensive care unit and treated conservatively and did not require any neurosurgical intervention.  She was initially on a Cardene drip and she subsequently transition to oral medication when she could swallow safely.  CT scan of the head was repeated twice and showed stable appearance of the hematoma without intraventricular extension or hydrocephalus.  MRI scan of the brain also confirmed stable size of the hematoma without any underlying tumor or masses seen.  MRA showed no aneurysm or large vessel occlusion.  The patient did not need any hypertonic saline for edema management.  The patient had history of paroxysmal A. fib and was on aspirin prior to admission which was held during the hospitalization.   Patient was a smoker and agreed to quit smoking.  She was discharged home and aspirin was resumed.  She states she is done well since discharge.  She gets occasional headaches at the back of her head but these are not bothersome.  These occur only once or twice a week and Tylenol seems to help.  She had similar headaches even prior to her intracerebral hemorrhage.  She feels her left upper extremity strength is much better though she still has some mild grip weakness and left leg is stronger.  Occasionally she feels numbness in the left thigh.  She feels that the left peripheral vision has improved significantly.  She has an upcoming appointment to see ophthalmologist Dr. Annamaria Boots to check formal visual field.  Patient has finished home physical occupational therapy and is doing well.  She wants to drive.  She states her blood pressure is better controlled and today it is 145/82.  ROS:   14 system review of systems is positive for depression, headache,vision difficulties only and all other systems negative  PMH:  Past Medical History:  Diagnosis Date  . Dyslipidemia   . GERD (gastroesophageal reflux disease)   . High risk medication use    on Flecainide since January of 2013  . Hypertension   . Obesity   . PAF (paroxysmal atrial fibrillation) ([place])    normal stress echo in 2011    Social History:  Social History   Socioeconomic History  . Marital status: Married    Spouse name: Not on file  . Number of children:  Not on file  . Years of education: Not on file  . Highest education level: Not on file  Occupational History  . Not on file  Social Needs  . Financial resource strain: Not on file  . Food insecurity:    Worry: Not on file    Inability: Not on file  . Transportation needs:    Medical: Not on file    Non-medical: Not on file  Tobacco Use  . Smoking status: Former Research scientist (life sciences)  . Smokeless tobacco: Never Used  Substance and Sexual Activity  . Alcohol use: No  . Drug use: No  .  Sexual activity: Yes  Lifestyle  . Physical activity:    Days per week: Not on file    Minutes per session: Not on file  . Stress: Not on file  Relationships  . Social connections:    Talks on phone: Not on file    Gets together: Not on file    Attends religious service: Not on file    Active member of club or organization: Not on file    Attends meetings of clubs or organizations: Not on file    Relationship status: Not on file  . Intimate partner violence:    Fear of current or ex partner: Not on file    Emotionally abused: Not on file    Physically abused: Not on file    Forced sexual activity: Not on file  Other Topics Concern  . Not on file  Social History Narrative  . Not on file    Medications:   Current Outpatient Medications on File Prior to Visit  Medication Sig Dispense Refill  . acetaminophen (TYLENOL) 500 MG tablet Take 500 mg by mouth every 6 (six) hours as needed for mild pain.    Marland Kitchen aspirin 81 MG tablet Take 81 mg by mouth every evening.     . benazepril (LOTENSIN) 5 MG tablet Take 5 mg by mouth. TAKE 2 TABLET(TOTAL 10 MG)  IN THE MORNING AND 3 TABLETS ( TOTAL 15 MG) IN THE EVENING    . Cyanocobalamin (B-12) 1000 MCG TABS Take by mouth.    . famotidine (PEPCID) 40 MG tablet Take 1 tablet (40 mg total) by mouth daily.    . flecainide (TAMBOCOR) 100 MG tablet Take 100 mg by mouth daily with breakfast.    . flecainide (TAMBOCOR) 50 MG tablet Take 1 tablet (50 mg total) by mouth at bedtime.    . gabapentin (NEURONTIN) 100 MG capsule Take 1 capsule (100 mg total) by mouth at bedtime.    . metoprolol tartrate (LOPRESSOR) 25 MG tablet Take 0.5 tablets (12.5 mg total) by mouth 2 (two) times daily.    . nitroGLYCERIN (NITROSTAT) 0.4 MG SL tablet Place 1 tablet (0.4 mg total) under the tongue every 5 (five) minutes as needed for chest pain (x 3 doses). 25 tablet 3  . sertraline (ZOLOFT) 50 MG tablet Take 1 tablet (50 mg total) by mouth at bedtime.     No current  facility-administered medications on file prior to visit.     Allergies:   Allergies  Allergen Reactions  . Penicillins     Passed out Has patient had a PCN reaction causing immediate rash, facial/tongue/throat swelling, SOB or lightheadedness with hypotension: Yes Has patient had a PCN reaction causing severe rash involving mucus membranes or skin necrosis: No Has patient had a PCN reaction that required hospitalization: No Has patient had a PCN reaction occurring within the last 10 years:  No If all of the above answers are "NO", then may proceed with Cephalosporin use.   . Codeine Itching  . Tape Rash    Medical tape did this    Physical Exam General: well developed, well nourished, seated, in no evident distress Head: head normocephalic and atraumatic.  Neck: supple with no carotid or supraclavicular bruits Cardiovascular: regular rate and rhythm, no murmurs Musculoskeletal: no deformity Skin:  no rash/petichiae Vascular:  Normal pulses all extremities Vitals:   10/20/18 1043  BP: (!) 145/82  Pulse: (!) 56   Neurologic Exam Mental Status: Awake and fully alert. Oriented to place and time. Recent and remote memory intact. Attention span, concentration and fund of knowledge appropriate. Mood and affect appropriate.  Cranial Nerves: Fundoscopic exam reveals sharp disc margins. Pupils equal, briskly reactive to light. Extraocular movements full without nystagmus. Visual fields show very small incomplete left homonymous hemianopsia to confrontation. Hearing intact. Facial sensation intact. Face, tongue, palate moves normally and symmetrically.  Motor: Normal bulk and tone. Normal strength in all tested extremity muscles.  Diminished fine finger movements on the left.  Orbits right over left upper extremity.  Mild weakness of left ankle dorsiflexors only. Sensory.: intact to touch ,pinprick .position and vibratory sensation.  Coordination: Rapid alternating movements normal in all  extremities. Finger-to-nose and heel-to-shin performed accurately bilaterally. Gait and Station: Arises from chair without difficulty. Stance is normal. Gait demonstrates normal stride length and balance . Able to heel, toe and tandem walk with mild difficulty.  Reflexes: 1+ and symmetric. Toes downgoing.   NIHSS  1 Modified Rankin  2   ASSESSMENT: 72 year old lady with large right parieto-occipital parenchymal intracerebral hemorrhage of indeterminate etiology possibly hypertensive     PLAN: I had a long discussion with the patient and her daughter regarding her recent intracerebral hemorrhage and answered questions.  I recommend we repeat MRI scan of the brain with and without contrast to rule out any underlying vascular abnormalities which were not seen on the initial MRI.  Maintain strict control of hypertension with blood pressure goal below 130/90.  Continue aspirin for stroke prevention.  I recommend she not drive until she keeps her appointment with ophthalmologist and has a documented improvement in her peripheral visual fields.  She will return for follow-up in 6 months with my nurse practitioner call earlier if necessary. Greater than 50% of time during this 25 minute visit was spent on counseling,explanation of diagnosis of intracerebral hemorrhage, planning of further management, discussion with patient and family and coordination of care Antony Contras, MD  [facility] [address] [place], [place] [address]  Phone [phone] Fax [phone] Note: This document was prepared with digital dictation and possible smart phrase technology. Any transcriptional errors that result from this process are unintentional

## 2018-10-20 NOTE — Patient Instructions (Signed)
I had a long discussion with the patient and her daughter regarding her recent intracerebral hemorrhage and answered questions.  I recommend we repeat MRI scan of the brain with and without contrast to rule out any underlying vascular abnormalities which were not seen on the initial MRI.  Maintain strict control of hypertension with blood pressure goal below 130/90.  Continue aspirin for stroke prevention.  I recommend she not drive until she keeps her appointment with ophthalmologist and has a documented improvement in her peripheral visual fields.  She will return for follow-up in 6 months with my nurse practitioner call earlier if necessary.

## 2018-10-31 ENCOUNTER — Ambulatory Visit
Admission: RE | Admit: 2018-10-31 | Discharge: 2018-10-31 | Disposition: A | Discharge status: Home / Self Care | Payer: Medicare Other | Source: Ambulatory Visit | Attending: Neurology | Admitting: Neurology

## 2018-10-31 DIAGNOSIS — I611 Nontraumatic intracerebral hemorrhage in hemisphere, cortical: Secondary | ICD-10-CM | POA: Diagnosis not present

## 2018-10-31 MED ORDER — GADOBENATE DIMEGLUMINE 529 MG/ML IV SOLN
15.0000 mL | Freq: Once | INTRAVENOUS | Status: AC | PRN
  Administered 2018-10-31: 15 mL via INTRAVENOUS

## 2018-11-02 ENCOUNTER — Telehealth: Payer: Self-pay | Admitting: Neurology

## 2018-11-02 NOTE — Telephone Encounter (Signed)
Pt states she was told by Dr Leonie Man that she would get a call the day after the MRI with the results.  Pt is asking to be called when results are available

## 2018-11-02 NOTE — Telephone Encounter (Signed)
RN call patient that MIRI results take 24 to 72 hours to be read correctly. Rn stated a message will be sent to Dr .Leonie Man, pt verbalized understanding.

## 2018-11-02 NOTE — Telephone Encounter (Signed)
I called the patient and communicated results of the MRI scan of the brain showing expected resolution and shrinkage of the hematoma a .  No new or worrisome finding.  She voiced understanding.

## 2018-11-03 DIAGNOSIS — N183 Chronic kidney disease, stage 3 (moderate): Secondary | ICD-10-CM | POA: Diagnosis not present

## 2018-11-03 DIAGNOSIS — I129 Hypertensive chronic kidney disease with stage 1 through stage 4 chronic kidney disease, or unspecified chronic kidney disease: Secondary | ICD-10-CM | POA: Diagnosis not present

## 2018-11-03 DIAGNOSIS — Z79899 Other long term (current) drug therapy: Secondary | ICD-10-CM | POA: Diagnosis not present

## 2018-11-05 ENCOUNTER — Other Ambulatory Visit: Payer: Self-pay | Admitting: Geriatric Medicine

## 2018-11-05 DIAGNOSIS — D3501 Benign neoplasm of right adrenal gland: Secondary | ICD-10-CM

## 2018-11-05 DIAGNOSIS — Z8679 Personal history of other diseases of the circulatory system: Secondary | ICD-10-CM | POA: Diagnosis not present

## 2018-11-11 ENCOUNTER — Inpatient Hospital Stay: Admission: RE | Admit: 2018-11-11 | Payer: Medicare Other | Source: Ambulatory Visit

## 2018-11-15 DIAGNOSIS — H53462 Homonymous bilateral field defects, left side: Secondary | ICD-10-CM | POA: Diagnosis not present

## 2018-11-15 DIAGNOSIS — H5203 Hypermetropia, bilateral: Secondary | ICD-10-CM | POA: Diagnosis not present

## 2018-11-15 DIAGNOSIS — H2513 Age-related nuclear cataract, bilateral: Secondary | ICD-10-CM | POA: Diagnosis not present

## 2018-11-16 ENCOUNTER — Other Ambulatory Visit: Payer: Medicare Other

## 2018-11-29 ENCOUNTER — Ambulatory Visit
Admission: RE | Admit: 2018-11-29 | Discharge: 2018-11-29 | Disposition: A | Discharge status: Home / Self Care | Payer: Medicare Other | Source: Ambulatory Visit | Attending: Geriatric Medicine | Admitting: Geriatric Medicine

## 2018-11-29 DIAGNOSIS — D3501 Benign neoplasm of right adrenal gland: Secondary | ICD-10-CM | POA: Diagnosis not present

## 2018-11-29 DIAGNOSIS — E279 Disorder of adrenal gland, unspecified: Secondary | ICD-10-CM | POA: Diagnosis not present

## 2018-12-03 DIAGNOSIS — Z8742 Personal history of other diseases of the female genital tract: Secondary | ICD-10-CM | POA: Diagnosis not present

## 2018-12-03 DIAGNOSIS — N83201 Unspecified ovarian cyst, right side: Secondary | ICD-10-CM | POA: Diagnosis not present

## 2018-12-03 DIAGNOSIS — Z90710 Acquired absence of both cervix and uterus: Secondary | ICD-10-CM | POA: Diagnosis not present

## 2018-12-08 DIAGNOSIS — N83201 Unspecified ovarian cyst, right side: Secondary | ICD-10-CM | POA: Diagnosis not present

## 2018-12-18 ENCOUNTER — Other Ambulatory Visit: Payer: Self-pay | Admitting: Cardiology

## 2018-12-18 DIAGNOSIS — I480 Paroxysmal atrial fibrillation: Secondary | ICD-10-CM

## 2018-12-20 NOTE — Telephone Encounter (Signed)
Rx(s) sent to pharmacy electronically.  

## 2019-03-02 DIAGNOSIS — N183 Chronic kidney disease, stage 3 (moderate): Secondary | ICD-10-CM | POA: Diagnosis not present

## 2019-03-02 DIAGNOSIS — M79641 Pain in right hand: Secondary | ICD-10-CM | POA: Diagnosis not present

## 2019-03-02 DIAGNOSIS — I129 Hypertensive chronic kidney disease with stage 1 through stage 4 chronic kidney disease, or unspecified chronic kidney disease: Secondary | ICD-10-CM | POA: Diagnosis not present

## 2019-03-02 DIAGNOSIS — I480 Paroxysmal atrial fibrillation: Secondary | ICD-10-CM | POA: Diagnosis not present

## 2019-03-24 ENCOUNTER — Other Ambulatory Visit: Payer: Self-pay | Admitting: Cardiology

## 2019-03-24 DIAGNOSIS — I480 Paroxysmal atrial fibrillation: Secondary | ICD-10-CM

## 2019-03-24 NOTE — Telephone Encounter (Signed)
Flecainide 50 mg refilled.

## 2019-04-19 ENCOUNTER — Telehealth: Payer: Self-pay

## 2019-04-19 NOTE — Telephone Encounter (Signed)
I called pt that visit will be video due to COVID 19. I receive verbal consent to do video and to file insurance. Pt has a [employer] phone and [employer] is her carrier. I explain the doxy process and she verbalized understanding. She knows that she will get a text today but will click link 10 minutes prior to appt.

## 2019-04-21 ENCOUNTER — Ambulatory Visit (INDEPENDENT_AMBULATORY_CARE_PROVIDER_SITE_OTHER): Payer: Medicare Other | Admitting: Adult Health

## 2019-04-21 ENCOUNTER — Other Ambulatory Visit: Payer: Self-pay

## 2019-04-21 ENCOUNTER — Encounter: Payer: Self-pay | Admitting: Adult Health

## 2019-04-21 DIAGNOSIS — I10 Essential (primary) hypertension: Secondary | ICD-10-CM | POA: Diagnosis not present

## 2019-04-21 DIAGNOSIS — R200 Anesthesia of skin: Secondary | ICD-10-CM

## 2019-04-21 DIAGNOSIS — I611 Nontraumatic intracerebral hemorrhage in hemisphere, cortical: Secondary | ICD-10-CM | POA: Diagnosis not present

## 2019-04-21 NOTE — Telephone Encounter (Signed)
Link text to patient today.

## 2019-04-21 NOTE — Progress Notes (Signed)
[facility] [address] [place]. [place] [address] [phone]        FOLLOW-UP NOTE  Ms. TRANA RESSLER Date of Birth:  Apr 08, 1946 Medical Record Number:  [id number]   Virtual Visit via Video Note  I connected with [name] on 04/21/19 at 11:15 AM EDT by a video enabled telemedicine application located remotely in my own home and verified that I am speaking with the correct person using two identifiers who was located at their own home.   I discussed the limitations of evaluation and management by telemedicine and the availability of in person appointments. The patient expressed understanding and agreed to proceed.    HPI:  04/21/19 VIRTUAL VISIT She has been doing well from a stroke standpoint with left hand coordination difficulty and left thigh numbness/tingling but otherwise recovered well. She does endorse mild burning sensation on left thigh and at times will wake her up at night while sleeping. She continues on gabapentin 100mg  nightly. No residual visual loss. She has returned to driving without difficulty. She did undergo MRI brain w wo contrast which was unremarkable for any underlying abnormalities.  She continues on aspirin without side effects bleeding or bruising. Blood pressure monitored at home which has been stable.    Hospital summary:  73 year old lady seen today for initial office follow-up visit following hospital admission for intracerebral hemorrhage.  History is obtained from the patient and daughter as well as review of electronic medical records and have personally reviewed imaging films.  Ms. Cindi Carbon is a pleasant 73 year old lady with past medical history of hyperlipidemia, hypertension, obesity, paroxysmal atrial fibrillation who presented to [facility] on 07/13/2018 with several days of headache as well as a few bouts of vomiting.  She underwent colonoscopy on 07/12/2018 and while in the PACU following the procedure she  was noted as not moving her left side well.  Her blood pressure has slightly gone up to 173/120 there.  CT scan of the head showed a 4.7 x 4.2 x 5.3 cm hematoma centered in the right parietal lobe with a 10 rim of surrounding edema. and 3 mm midline shift.  NIH stroke scale on admission was 5 with ICH score of 2.    CT scan of the head was repeated twice and showed stable appearance of the hematoma without intraventricular extension or hydrocephalus.  MRI scan of the brain also confirmed stable size of the hematoma without any underlying tumor or masses seen.  MRA showed no aneurysm or large vessel occlusion. The patient had history of paroxysmal A. fib and was on aspirin prior to admission which was held during the hospitalization.  Patient was a smoker and agreed to quit smoking.     ROS:   14 system review of systems is positive for numbness/tingling and all other systems negative  PMH:  Past Medical History:  Diagnosis Date  . Dyslipidemia   . GERD (gastroesophageal reflux disease)   . High risk medication use    on Flecainide since January of 2013  . Hypertension   . Obesity   . PAF (paroxysmal atrial fibrillation) ([place] [facility])    normal stress echo in 2011    Social History:  Social History   Socioeconomic History  . Marital status: Married    Spouse name: Not on file  . Number of children: Not on file  . Years of education: Not on file  . Highest education level: Not on file  Occupational History  . Not on  file  Social Needs  . Financial resource strain: Not on file  . Food insecurity:    Worry: Not on file    Inability: Not on file  . Transportation needs:    Medical: Not on file    Non-medical: Not on file  Tobacco Use  . Smoking status: Former Research scientist (life sciences)  . Smokeless tobacco: Never Used  Substance and Sexual Activity  . Alcohol use: No  . Drug use: No  . Sexual activity: Yes  Lifestyle  . Physical activity:    Days per week: Not on file    Minutes per session: Not on  file  . Stress: Not on file  Relationships  . Social connections:    Talks on phone: Not on file    Gets together: Not on file    Attends religious service: Not on file    Active member of club or organization: Not on file    Attends meetings of clubs or organizations: Not on file    Relationship status: Not on file  . Intimate partner violence:    Fear of current or ex partner: Not on file    Emotionally abused: Not on file    Physically abused: Not on file    Forced sexual activity: Not on file  Other Topics Concern  . Not on file  Social History Narrative  . Not on file    Medications:   Current Outpatient Medications on File Prior to Visit  Medication Sig Dispense Refill  . acetaminophen (TYLENOL) 500 MG tablet Take 500 mg by mouth every 6 (six) hours as needed for mild pain.    Marland Kitchen aspirin 81 MG tablet Take 81 mg by mouth every evening.     . benazepril (LOTENSIN) 5 MG tablet Take 5 mg by mouth. TAKE 2 TABLET(TOTAL 10 MG)  IN THE MORNING AND 3 TABLETS ( TOTAL 15 MG) IN THE EVENING    . Cyanocobalamin (B-12) 1000 MCG TABS Take by mouth.    . famotidine (PEPCID) 40 MG tablet Take 1 tablet (40 mg total) by mouth daily.    . flecainide (TAMBOCOR) 50 MG tablet TAKE 2 TABLETS (100 MG TOTAL) BY MOUTH EVERY MORNING AND 1 TABLET (50 MG TOTAL) EVERY EVENING. 90 tablet 6  . gabapentin (NEURONTIN) 100 MG capsule Take 1 capsule (100 mg total) by mouth at bedtime.    . metoprolol tartrate (LOPRESSOR) 25 MG tablet Take 1 tablet (25 mg total) by mouth 2 (two) times daily. 180 tablet 2  . nitroGLYCERIN (NITROSTAT) 0.4 MG SL tablet Place 1 tablet (0.4 mg total) under the tongue every 5 (five) minutes as needed for chest pain (x 3 doses). 25 tablet 3  . sertraline (ZOLOFT) 50 MG tablet Take 1 tablet (50 mg total) by mouth at bedtime.     No current facility-administered medications on file prior to visit.     Allergies:   Allergies  Allergen Reactions  . Penicillins     Passed out Has  patient had a PCN reaction causing immediate rash, facial/tongue/throat swelling, SOB or lightheadedness with hypotension: Yes Has patient had a PCN reaction causing severe rash involving mucus membranes or skin necrosis: No Has patient had a PCN reaction that required hospitalization: No Has patient had a PCN reaction occurring within the last 10 years: No If all of the above answers are "NO", then may proceed with Cephalosporin use.   . Codeine Itching  . Tape Rash    Medical tape did this  Physical Exam General: well developed, well nourished, pleasant elderly Caucasian female, seated, in no evident distress Head: head normocephalic and atraumatic.   Neurologic Exam Mental Status: Awake and fully alert. Oriented to place and time. Recent and remote memory intact. Attention span, concentration and fund of knowledge appropriate. Mood and affect appropriate.  Cranial Nerves: Extraocular movements full without nystagmus. Hearing intact to voice. Facial sensation intact. Face, tongue, palate moves normally and symmetrically.  Motor: No evidence of large muscle weakness per drift assessment.  Slightly decreased left hand finger dexterity. Sensory.: intact to light touch Coordination: Rapid alternating movements normal in all extremities except for slightly diminished in left hand. Finger-to-nose and heel-to-shin performed accurately bilaterally. Gait and Station: Arises from chair without difficulty. Stance is normal. Gait demonstrates normal stride length and balance .  Reflexes: UTA     ASSESSMENT: 73 year old lady with large right parieto-occipital parenchymal intracerebral hemorrhage of indeterminate etiology possibly hypertensive.  Residual deficits of left hand decreased coordination and paresthesias of left thigh.  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     PLAN: -continue aspirin for secondary stroke prevention -continue  gabapentin 100mg  nightly for nerve pain -Maintain strict control of hypertension with blood pressure goal below 130/90.   -Continue to stay active and maintain a healthy diet -Continue to follow with PCP for HTN management -Continue to follow with ophthalmology as scheduled  Stable from stroke standpoint and recommend follow-up as needed   Greater than 50% of time during this 15 minute non-face-to-face visit was spent on counseling,explanation of diagnosis of intracerebral hemorrhage, planning of further management, discussion with patient and family and coordination of care  Venancio Poisson, AGNP-BC  [facility] [address] [place], [place] [address]  Phone [phone] Fax [phone] Note: This document was prepared with digital dictation and possible smart phrase technology. Any transcriptional errors that result from this process are unintentional.

## 2019-04-22 NOTE — Progress Notes (Signed)
I agree with the above plan 

## 2019-06-15 DIAGNOSIS — N183 Chronic kidney disease, stage 3 (moderate): Secondary | ICD-10-CM | POA: Diagnosis not present

## 2019-06-15 DIAGNOSIS — I480 Paroxysmal atrial fibrillation: Secondary | ICD-10-CM | POA: Diagnosis not present

## 2019-06-15 DIAGNOSIS — Z79899 Other long term (current) drug therapy: Secondary | ICD-10-CM | POA: Diagnosis not present

## 2019-06-15 DIAGNOSIS — I129 Hypertensive chronic kidney disease with stage 1 through stage 4 chronic kidney disease, or unspecified chronic kidney disease: Secondary | ICD-10-CM | POA: Diagnosis not present

## 2019-06-15 DIAGNOSIS — R5383 Other fatigue: Secondary | ICD-10-CM | POA: Diagnosis not present

## 2019-06-15 DIAGNOSIS — M545 Low back pain: Secondary | ICD-10-CM | POA: Diagnosis not present

## 2019-06-15 DIAGNOSIS — R300 Dysuria: Secondary | ICD-10-CM | POA: Diagnosis not present

## 2019-06-27 DIAGNOSIS — N83201 Unspecified ovarian cyst, right side: Secondary | ICD-10-CM | POA: Diagnosis not present

## 2019-09-06 ENCOUNTER — Other Ambulatory Visit: Payer: Self-pay

## 2019-09-12 ENCOUNTER — Other Ambulatory Visit: Payer: Self-pay | Admitting: *Deleted

## 2019-09-12 NOTE — Patient Outreach (Signed)
[facility]) Care Management  09/12/2019  [name] 06-Jun-1946 [id number]   Telephone Screen  Referral Date:  09/06/2019 Referral Source:  EMMI Prevent Reason for Referral:  Screening Insurance:  Medicare   Outreach Attempt:  Outreach attempt #1 to patient for telephone screening post EMMI Prevent call.  Patient answered and was reluctant to verify HIPAA.  RN Health Coach introduced self and reason for call.  Patient requested a call back tomorrow.   Plan:  RN Health Coach will make another outreach attempt within the next 3 business days per patient request.   [name] RN [place] [phone] [name]

## 2019-09-13 ENCOUNTER — Other Ambulatory Visit: Payer: Self-pay | Admitting: *Deleted

## 2019-09-13 NOTE — Patient Outreach (Signed)
[facility] [facility]) Care Management  09/13/2019  [name] [birth year] [id number]   Telephone Screen  Referral Date:  09/06/2019 Referral Source:  EMMI Prevent Reason for Referral:  Screening Insurance:  Medicare   Outreach Attempt:  Successful telephone outreach to patient for telephone screening post EMMI Prevent call.  HIPAA verified with patient.  RN Health Coach introduced self and role.  [facility] services reviewed and discussed.  Patient stating "I don't think I have any chronic conditions" and my kids can help me manage my care.  Declines screening and [facility] services at this time.  Agreeable to receive [facility] Pamphlet.  Encouraged patient to contact [facility] in the future if becomes interested in services.  Plan:  RN Health Coach will close case and make patient inactive with [facility] due to patient declining services.  RN Health Coach will send patient Successful Outreach Letter with [place].  [name] [phone] [name]

## 2019-10-02 NOTE — Progress Notes (Signed)
Cardiology Office Note:    Date:  10/04/2019   ID:  [name], DOB [birth year], MRN [id number]  PCP:  [name], MD  Cardiologist:  Kailene Steinhart [name], MD    Referring MD: [name], MD   Chief complaint: Follow up atrial fibrillation   History of Present Illness:    [name] is a 73 y.o. female with a hx of infrequent episodes of paroxysmal atrial fibrillation, well controlled on a combination of flecainide and metoprolol.  She has no known structural heart disease.  She had a normal stress Echo in 2013 and normal Myoview study in 2008.   She was seen last year by Dr Sallyanne Kuster. Since I followed her husband for years she is seeing me for follow up now.  She was taking aspirin for stroke prevention.  She has never been on true anticoagulants.  She was admitted from 8/27-07/19/18 after she developed acute N/V and left hemiparesis following Colonoscopy. She had been off ASA for a couple of days. She was found to have a left occipital-parietal ICH. ASA was held but resumed at DC. On metoprolol and benazepril for HTN. Echo unremarkable. Continued on Flecainide. No Afib noted. She was on inpatient Rehab until 08/16/18 and then DC to [facility]. She returned home after 4 weeks Rehab.   On follow up today she is doing very well. BP is well controlled. She does complain of feeling very tired in the am after taking her pills. She did quit smoking a year ago. Has gained 22 lbs since then.   Past Medical History:  Diagnosis Date  . Dyslipidemia   . GERD (gastroesophageal reflux disease)   . High risk medication use    on Flecainide since January of 2013  . Hypertension   . Obesity   . PAF (paroxysmal atrial fibrillation) ([facility])    normal stress echo in 2011    Past Surgical History:  Procedure Laterality Date  . +    . ABDOMINAL HYSTERECTOMY    . ANKLE SURGERY    . CARDIOVASCULAR STRESS TEST  07/21/2007   EF 80%, NORMAL NONDIAGONISTIC FOR ISCHEMIA  . STRESS ECHO TEST   07/25/2010   EF 60%  . WRIST SURGERY     RIGHT WRIST    Current Medications: No outpatient medications have been marked as taking for the 10/04/19 encounter (Office Visit) with [name], Katherine Martelle M, MD.     Allergies:   Penicillins, Codeine, and Tape   Social History   Socioeconomic History  . Marital status: Married    Spouse name: Not on file  . Number of children: Not on file  . Years of education: Not on file  . Highest education level: Not on file  Occupational History  . Not on file  Social Needs  . Financial resource strain: Not on file  . Food insecurity    Worry: Not on file    Inability: Not on file  . Transportation needs    Medical: Not on file    Non-medical: Not on file  Tobacco Use  . Smoking status: Former Research scientist (life sciences)  . Smokeless tobacco: Never Used  Substance and Sexual Activity  . Alcohol use: No  . Drug use: No  . Sexual activity: Yes  Lifestyle  . Physical activity    Days per week: Not on file    Minutes per session: Not on file  . Stress: Not on file  Relationships  . Social Herbalist on phone: Not  on file    Gets together: Not on file    Attends religious service: Not on file    Active member of club or organization: Not on file    Attends meetings of clubs or organizations: Not on file    Relationship status: Not on file  Other Topics Concern  . Not on file  Social History Narrative  . Not on file     Family History: The patient's family history includes Alzheimer's disease in her mother; Cancer in her brother; Heart disease in her father; Lung cancer in her brother; Stroke in her sister. ROS:   Please see the history of present illness.     All other systems reviewed and are negative.  EKGs/Labs/Other Studies Reviewed:     Recent Labs: No results found for requested labs within last 8760 hours.  Recent Lipid Panel    Component Value Date/Time   CHOL 97 07/14/2018 0353   TRIG 110 07/14/2018 0353   HDL 26 (L) 07/14/2018  0353   CHOLHDL 3.7 07/14/2018 0353   VLDL 22 07/14/2018 0353   LDLCALC 49 07/14/2018 0353   Dated 09/08/18: creatinine 1.26. Hgb and potassium normal. Dated 06/15/19: CBC, BMET, TSH normal  Cardiac studies:  Ecg today shows NSR rate 56, cannot rule out old septal infarct. I have personally reviewed and interpreted this study.   Echo 07/15/18: Study Conclusions  - Left ventricle: The cavity size was normal. There was moderate   concentric hypertrophy. Systolic function was vigorous. The   estimated ejection fraction was in the range of 65% to 70%. Wall   motion was normal; there were no regional wall motion   abnormalities. - Aortic valve: There was no regurgitation. - Mitral valve: There was mild regurgitation. - Left atrium: The atrium was moderately dilated. - Right ventricle: The cavity size was normal. Wall thickness was   normal. Systolic function was normal. - Right atrium: The atrium was moderately dilated. - Tricuspid valve: There was moderate regurgitation. - Pulmonary arteries: Systolic pressure was moderately increased.   PA peak pressure: 47 mm Hg (S). - Inferior vena cava: The vessel was normal in size.  Impressions:  - No cardiac source of emboli was indentified.  Physical Exam:    VS:  BP 124/82   Pulse (!) 56   Temp (!) 97.2 F (36.2 C)   Ht 5\' 6"  (1.676 Guerra)   Wt 223 lb 9.6 oz (101.4 kg)   BMI 36.09 kg/Guerra     Wt Readings from Last 3 Encounters:  10/04/19 223 lb 9.6 oz (101.4 kg)  10/20/18 202 lb (91.6 kg)  10/04/18 202 lb 6.4 oz (91.8 kg)     GENERAL:  Well appearing WF in NAD HEENT:  PERRL, EOMI, sclera are clear. Oropharynx is clear. NECK:  No jugular venous distention, carotid upstroke brisk and symmetric, no bruits, no thyromegaly or adenopathy LUNGS:  Clear to auscultation bilaterally CHEST:  Unremarkable HEART:  RRR,  PMI not displaced or sustained,S1 and S2 within normal limits, no S3, no S4: no clicks, no rubs, no murmurs ABD:  Soft,  nontender. BS +, no masses or bruits. No hepatomegaly, no splenomegaly EXT:  2 + pulses throughout, no edema, no cyanosis no clubbing SKIN:  Warm and dry.  No rashes NEURO:  Alert and oriented x 3. Cranial nerves II through XII intact. PSYCH:  Cognitively intact    ASSESSMENT:    1. Paroxysmal atrial fibrillation ([facility]CC)   2. Essential hypertension    PLAN:  1. Paroxysmal Afib. Controlled on Flecainide. Went over dose again which is 100 mg in the am and 50 mg in the Pm.  Not a candidate for anticoagulation   due to recent ICH. Continue ASA.  2. Left parietal ICH 3. HTN. Controlled. Will switch benazepril to 10 mg bid. Reduce metoprolol to 12.5 mg in the evening only to see if this helps with am fatigue. She will monitor BP at home.  4. Tobacco abuse now stopped.  Follow up in one year.  Signed, Zelia Yzaguirre [name], MD  10/04/2019 3:32 PM    [place]

## 2019-10-04 ENCOUNTER — Other Ambulatory Visit: Payer: Self-pay

## 2019-10-04 ENCOUNTER — Encounter: Payer: Self-pay | Admitting: Cardiology

## 2019-10-04 ENCOUNTER — Ambulatory Visit (INDEPENDENT_AMBULATORY_CARE_PROVIDER_SITE_OTHER): Payer: Medicare Other | Admitting: Cardiology

## 2019-10-04 VITALS — BP 124/82 | HR 56 | Temp 97.2°F | Ht 66.0 in | Wt 223.6 lb

## 2019-10-04 DIAGNOSIS — I480 Paroxysmal atrial fibrillation: Secondary | ICD-10-CM

## 2019-10-04 DIAGNOSIS — I10 Essential (primary) hypertension: Secondary | ICD-10-CM | POA: Diagnosis not present

## 2019-10-04 MED ORDER — METOPROLOL TARTRATE 25 MG PO TABS: 12.5000 mg | ORAL_TABLET | Freq: Every day | ORAL | 2 refills | Status: DC

## 2019-10-04 NOTE — Addendum Note (Signed)
Addended by: Kathyrn Lass on: 10/04/2019 03:39 PM   Modules accepted: Orders

## 2019-10-04 NOTE — Patient Instructions (Signed)
Change benazepril to 10 mg twice a day  Change metoprolol to 12.5 mg once in the evening.   Monitor your blood pressure.   Continue your other therapy  Congratulations on quitting smoking.

## 2019-11-01 ENCOUNTER — Other Ambulatory Visit: Payer: Self-pay | Admitting: Cardiology

## 2019-11-01 DIAGNOSIS — I480 Paroxysmal atrial fibrillation: Secondary | ICD-10-CM

## 2019-12-29 DIAGNOSIS — I480 Paroxysmal atrial fibrillation: Secondary | ICD-10-CM | POA: Diagnosis not present

## 2019-12-29 DIAGNOSIS — N83201 Unspecified ovarian cyst, right side: Secondary | ICD-10-CM | POA: Diagnosis not present

## 2019-12-29 DIAGNOSIS — Z8673 Personal history of transient ischemic attack (TIA), and cerebral infarction without residual deficits: Secondary | ICD-10-CM | POA: Diagnosis not present

## 2020-06-05 ENCOUNTER — Other Ambulatory Visit: Payer: Self-pay | Admitting: Cardiology

## 2020-06-05 DIAGNOSIS — I480 Paroxysmal atrial fibrillation: Secondary | ICD-10-CM

## 2020-08-14 IMAGING — CT CT HEAD W/O CM
4 series · 16 of 47 positions shown, 18 images · non-contrast
Comparison: 07/13/2018 CT head.  07/14/2018 MRI head.

CLINICAL DATA: 71 y/o  F; follow-up of intracranial hemorrhage.

EXAM:
CT HEAD WITHOUT CONTRAST
TECHNIQUE: Contiguous axial images were obtained from the base of the skull
through the vertex without intravenous contrast.

[Series 3: head without · axial · non-contrast · 0.41mm/px · z∈[-84,+46]mm · 7 of 36 slices shown, 9 images]
[im 5/36  brain]
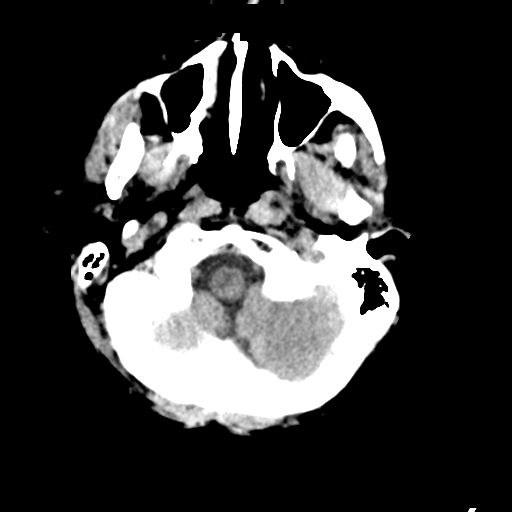
[im 5/36  bone]
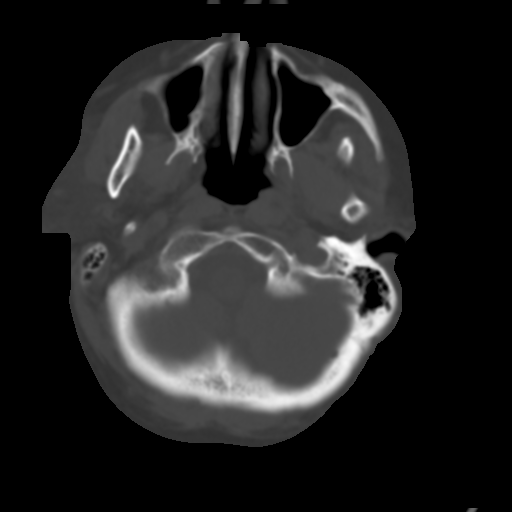
[im 9/36  brain]
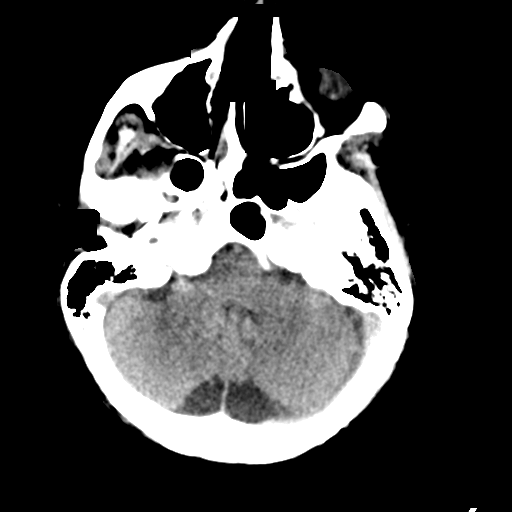
[im 14/36  brain]
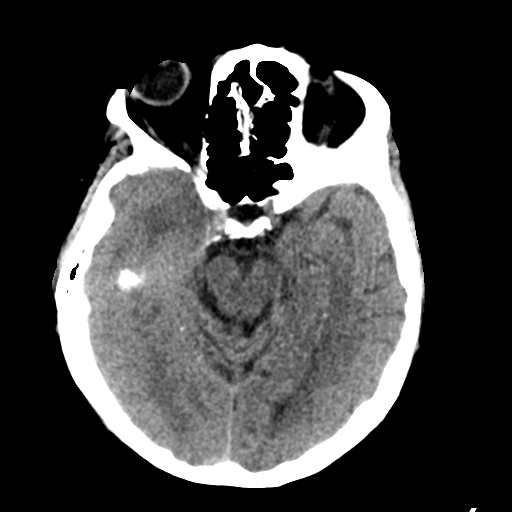
[im 18/36  brain]
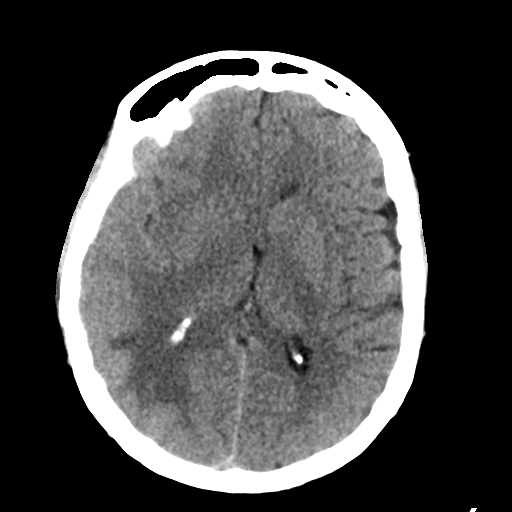
[im 22/36  brain]
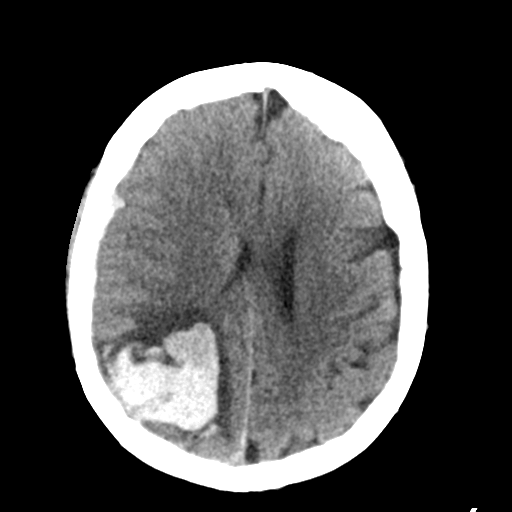
[im 22/36  bone]
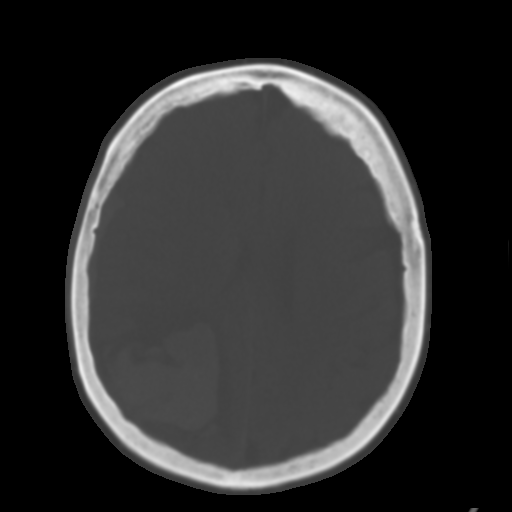
[im 27/36  brain]
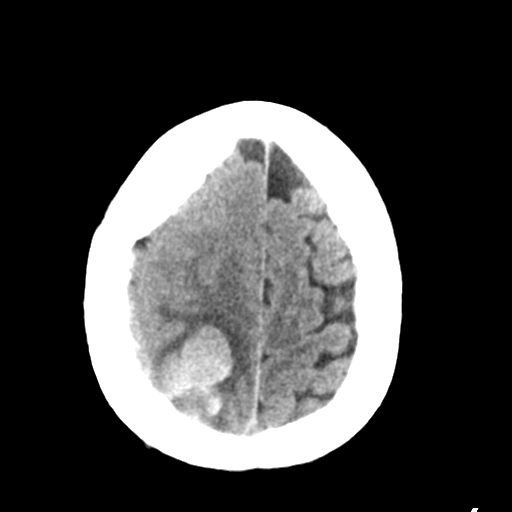
[im 31/36  brain]
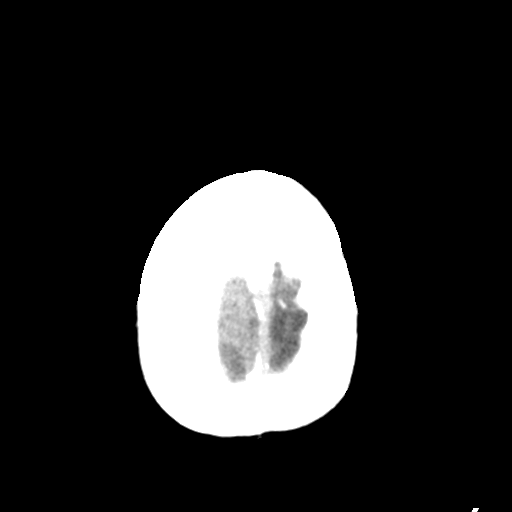

[Series 4: head bone · axial · 0.41mm/px · z∈[-88,-52]mm · 3 of 89 slices shown]
[im 9/89  bone]
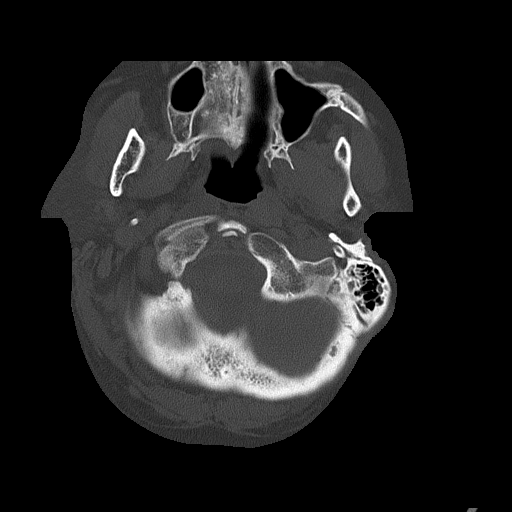
[im 18/89  bone]
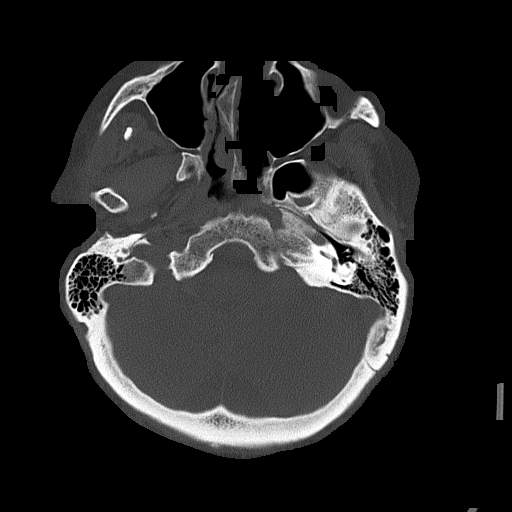
[im 27/89  bone]
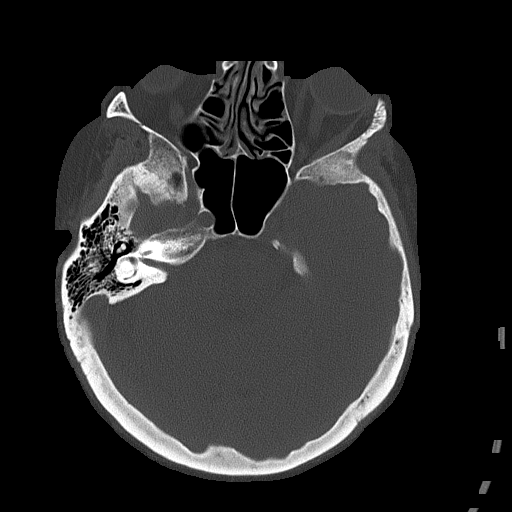

[Series 5: head without cor · coronal · non-contrast · 0.33mm/px · 3 of 67 slices shown]
[im 23/67  brain]
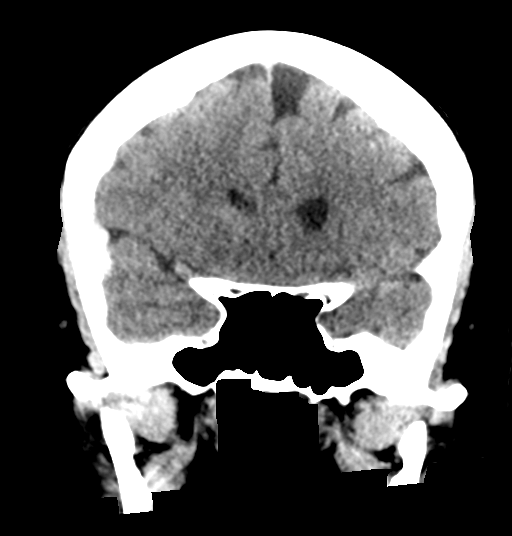
[im 30/67  brain]
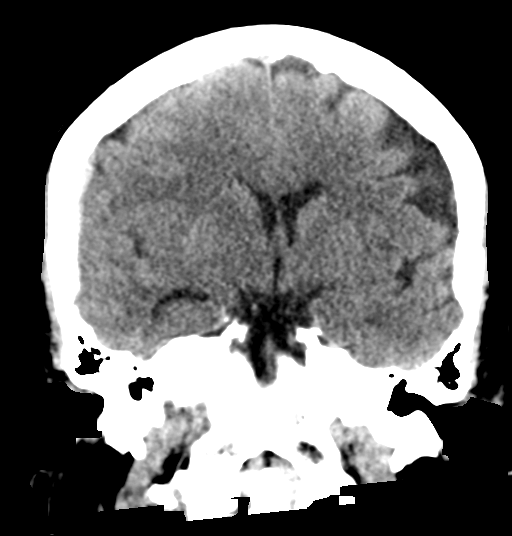
[im 37/67  brain]
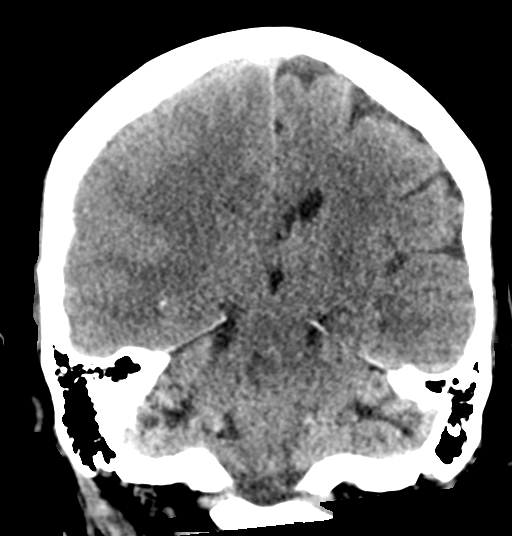

[Series 6: head without sag · sagittal · non-contrast · 0.33mm/px · 3 of 54 slices shown]
[im 18/54  brain]
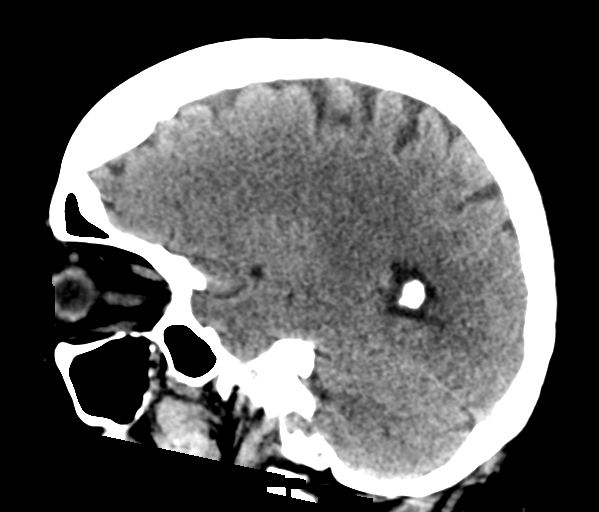
[im 27/54  brain]
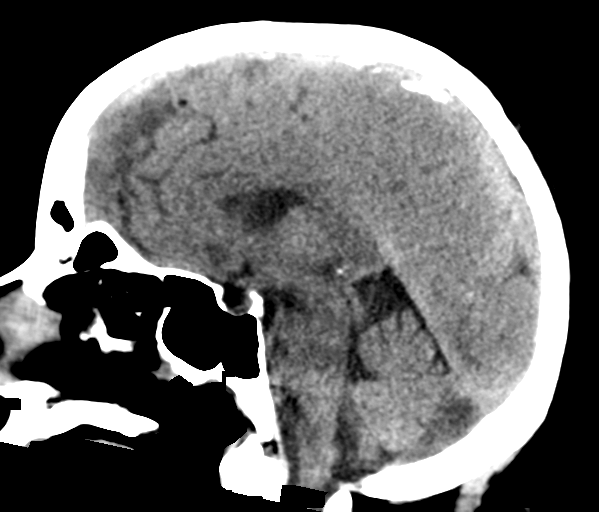
[im 36/54  brain]
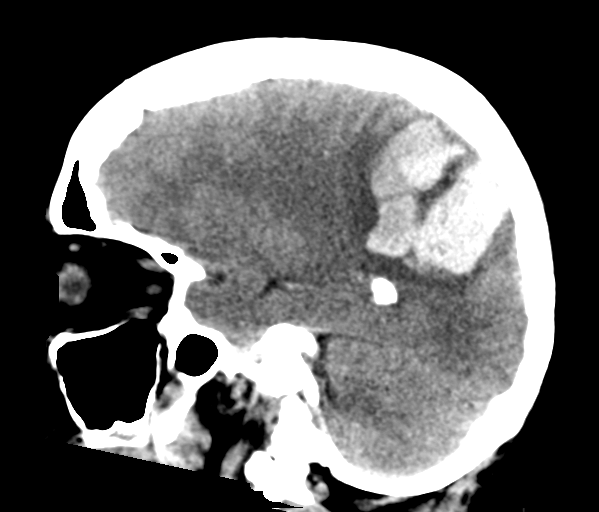

[16 of 47 positions shown; findings below may reference images not displayed]

FINDINGS: Brain: Right parietal hematoma measures 4.9 x 4.7 x 5.2 cm (volume =
63 cm^3) measured in a similar fashion to the prior CT of head,
previously 57 cc. Edema surrounding the hematoma is mildly increased
as is local mass effect effacing the posterior right lateral
ventricle resulting in 4 mm of right-to-left midline shift. Interval
partial dispersion of subarachnoid hemorrhage over the right
cerebral convexity. No new focus of intracranial hemorrhage or focal
mass effect identified.

Vascular: No hyperdense vessel or unexpected calcification.

Skull: Normal. Negative for fracture or focal lesion.

Sinuses/Orbits: No acute finding.

Other: None.
IMPRESSION: 1. Right parietal hematoma is stable to minimally increased in size
in comparison with the prior CT of the head given differences in
slice selection measuring 63 cc.
2. Interval partial dispersion of subarachnoid hemorrhage over the
right cerebral convexity.
3. Mild interval increase in edema surrounding the hematoma as well
as associated mass effect with 4 mm right-to-left midline shift.

By: Rengli Absar M.D.

## 2020-08-28 DIAGNOSIS — H2513 Age-related nuclear cataract, bilateral: Secondary | ICD-10-CM | POA: Diagnosis not present

## 2020-09-03 IMAGING — DX DG SHOULDER 2+V*L*
2 series · 2 of 2 positions shown · non-contrast
Comparison: Limited views of the left shoulder from a chest x-ray
July 13, 2018

CLINICAL DATA: The patient fell from a wheelchair this morning and
is complaining of mild left shoulder pain.

EXAM:
LEFT SHOULDER - 2+ VIEW

[shoulder grashey]
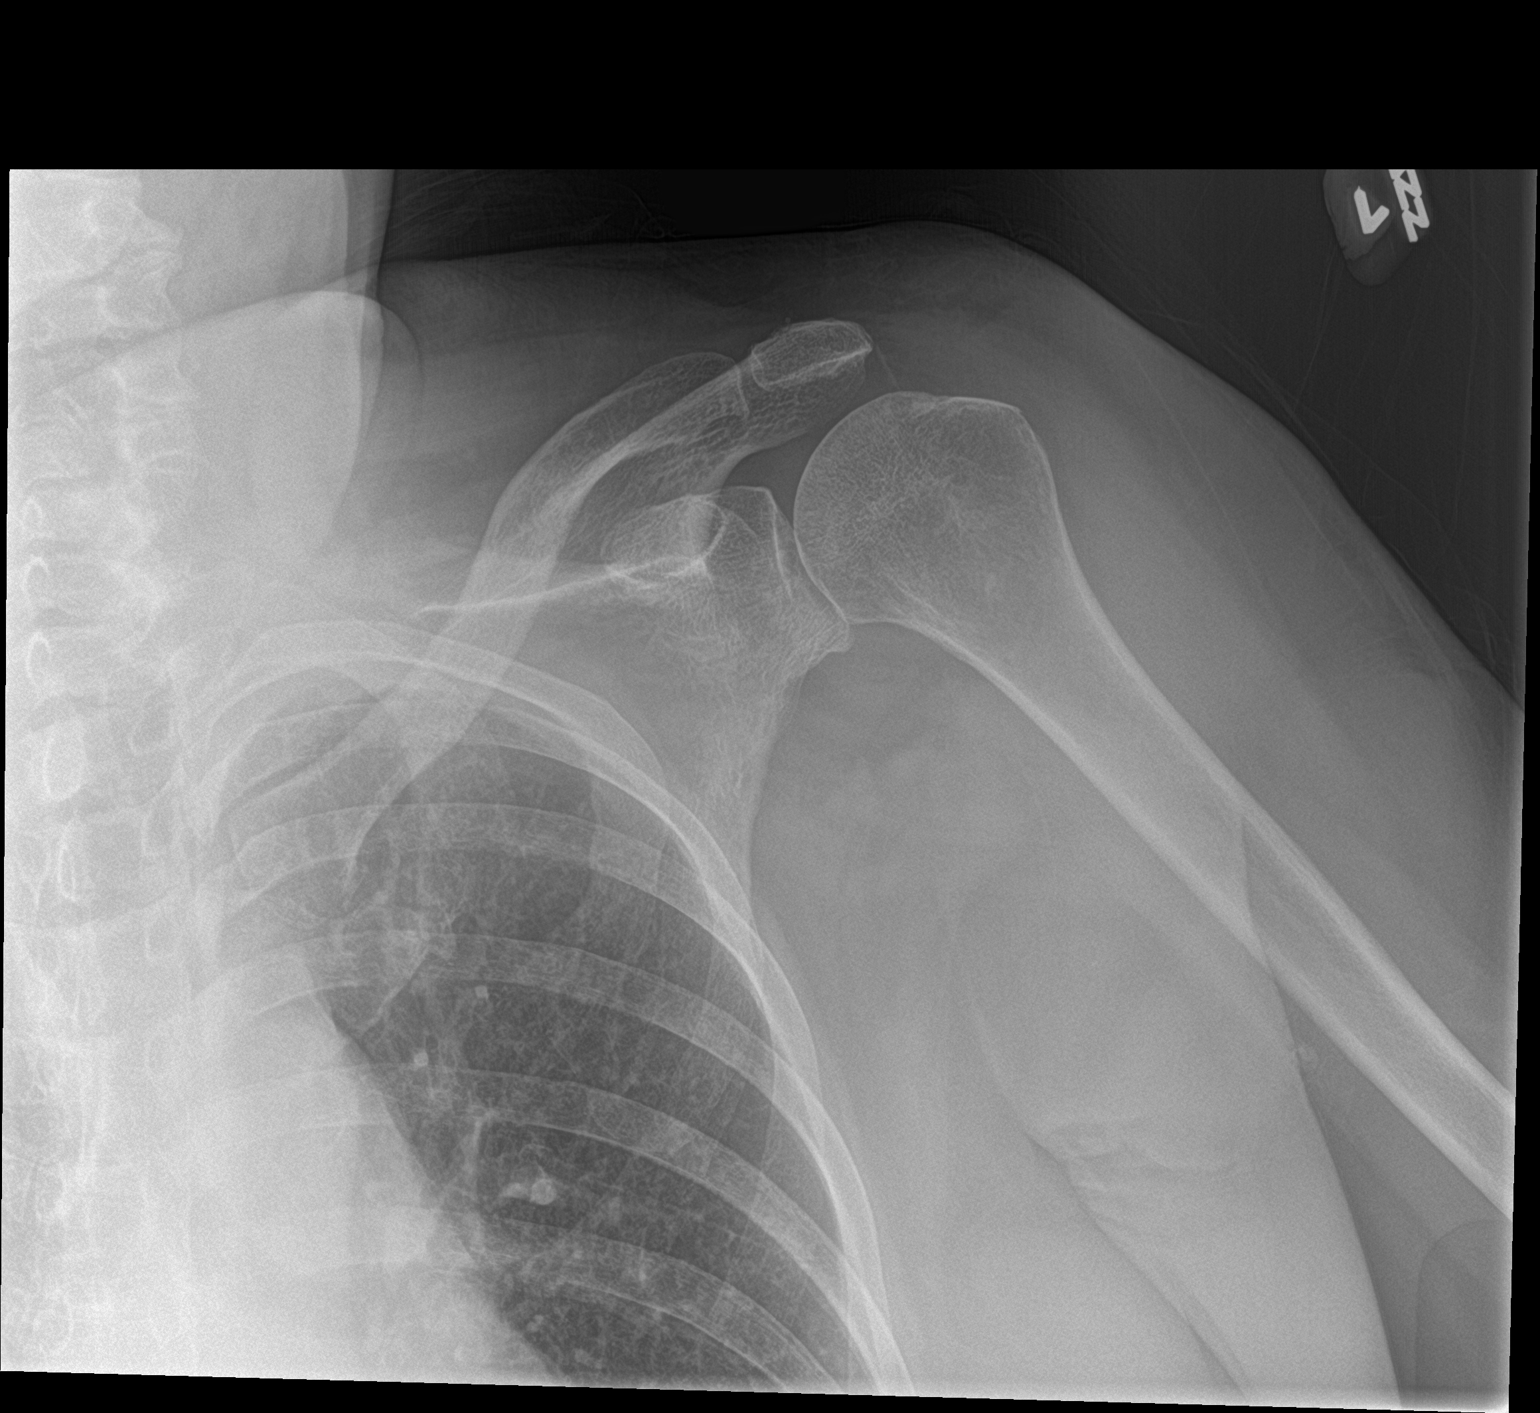

[shoulder y view]
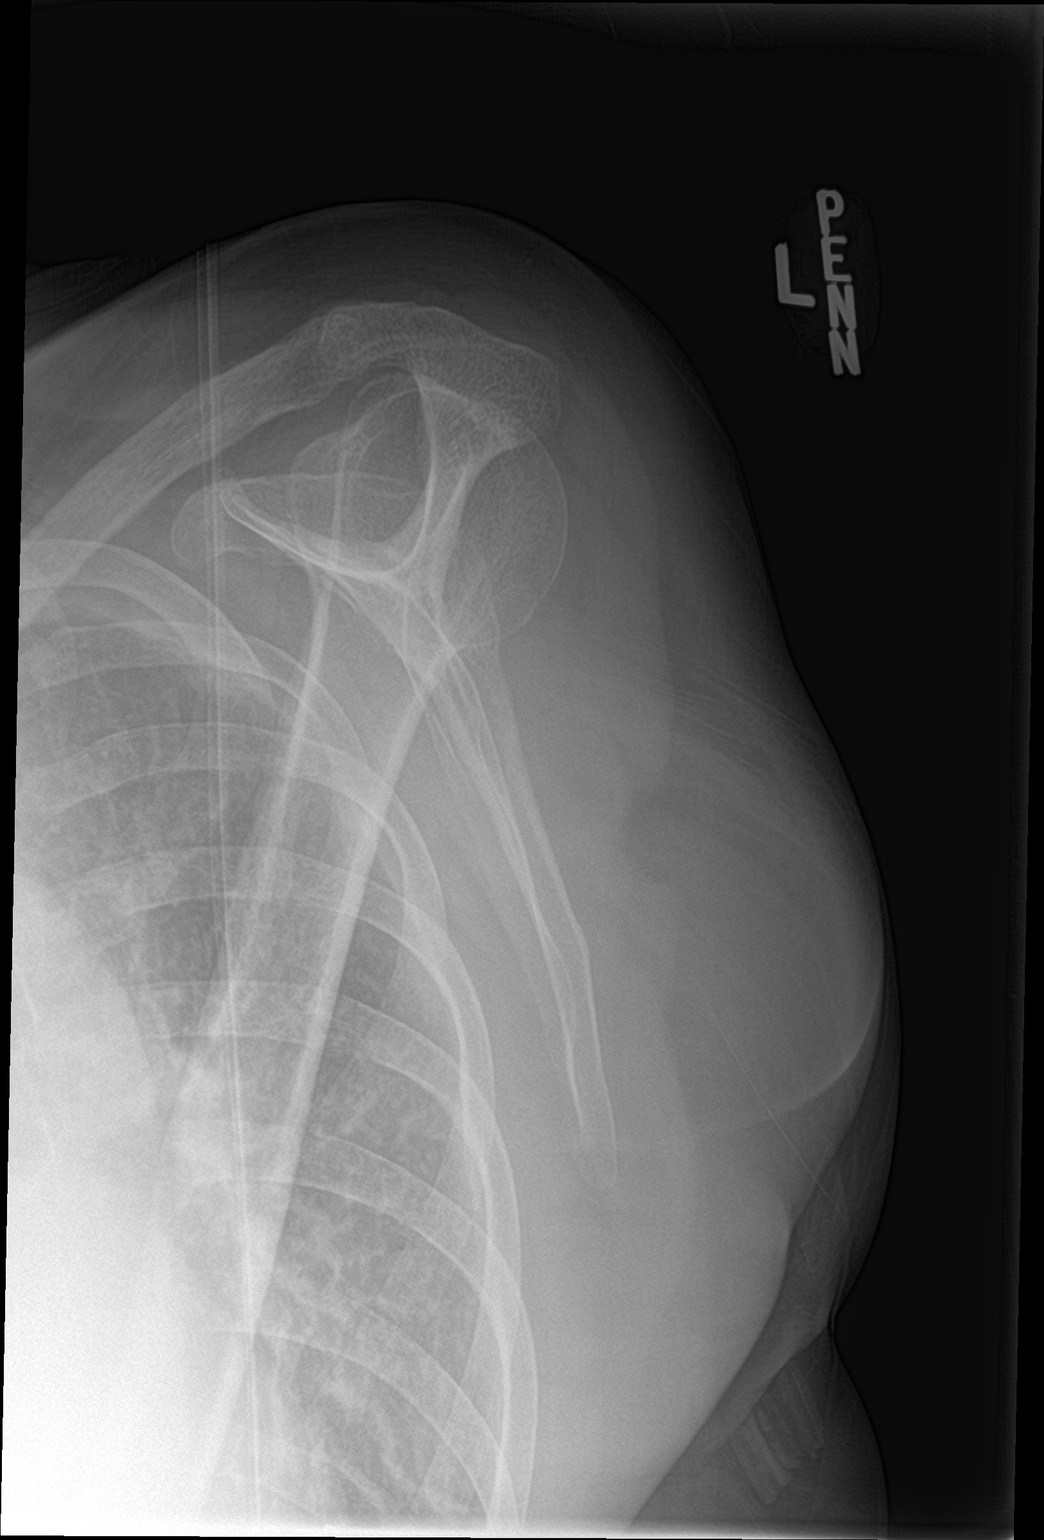

[2 of 2 positions shown; findings below may reference images not displayed]

FINDINGS: The bones are subjectively adequately mineralized. The joint spaces
are well maintained. There is no acute fracture or dislocation. The
observed portions of the upper left ribs are normal.
IMPRESSION: There is no acute bony abnormality of the left shoulder.

## 2020-09-04 ENCOUNTER — Other Ambulatory Visit: Payer: Self-pay | Admitting: Cardiology

## 2020-09-04 DIAGNOSIS — I480 Paroxysmal atrial fibrillation: Secondary | ICD-10-CM

## 2020-10-09 DIAGNOSIS — H2512 Age-related nuclear cataract, left eye: Secondary | ICD-10-CM | POA: Diagnosis not present

## 2020-10-09 DIAGNOSIS — H16223 Keratoconjunctivitis sicca, not specified as Sjogren's, bilateral: Secondary | ICD-10-CM | POA: Diagnosis not present

## 2020-10-09 DIAGNOSIS — H2513 Age-related nuclear cataract, bilateral: Secondary | ICD-10-CM | POA: Diagnosis not present

## 2020-10-09 DIAGNOSIS — H25043 Posterior subcapsular polar age-related cataract, bilateral: Secondary | ICD-10-CM | POA: Diagnosis not present

## 2020-10-09 DIAGNOSIS — H18413 Arcus senilis, bilateral: Secondary | ICD-10-CM | POA: Diagnosis not present

## 2020-10-09 DIAGNOSIS — H25013 Cortical age-related cataract, bilateral: Secondary | ICD-10-CM | POA: Diagnosis not present

## 2020-10-31 ENCOUNTER — Other Ambulatory Visit: Payer: Self-pay | Admitting: Cardiology

## 2020-10-31 DIAGNOSIS — I480 Paroxysmal atrial fibrillation: Secondary | ICD-10-CM | POA: Diagnosis not present

## 2020-10-31 DIAGNOSIS — Z23 Encounter for immunization: Secondary | ICD-10-CM | POA: Diagnosis not present

## 2020-10-31 DIAGNOSIS — I700 Atherosclerosis of aorta: Secondary | ICD-10-CM | POA: Diagnosis not present

## 2020-10-31 DIAGNOSIS — I129 Hypertensive chronic kidney disease with stage 1 through stage 4 chronic kidney disease, or unspecified chronic kidney disease: Secondary | ICD-10-CM | POA: Diagnosis not present

## 2020-10-31 DIAGNOSIS — N1831 Chronic kidney disease, stage 3a: Secondary | ICD-10-CM | POA: Diagnosis not present

## 2020-10-31 DIAGNOSIS — Z79899 Other long term (current) drug therapy: Secondary | ICD-10-CM | POA: Diagnosis not present

## 2020-10-31 DIAGNOSIS — R202 Paresthesia of skin: Secondary | ICD-10-CM | POA: Diagnosis not present

## 2020-10-31 DIAGNOSIS — Z0000 Encounter for general adult medical examination without abnormal findings: Secondary | ICD-10-CM | POA: Diagnosis not present

## 2020-11-15 NOTE — Progress Notes (Signed)
Cardiology Office Note:    Date:  11/20/2020   ID:  [name], DOB [birth year], MRN [id number]  PCP:  [name], MD  Cardiologist:  Havish Petties [name], MD    Referring MD: [name], MD   Chief complaint: Follow up atrial fibrillation   History of Present Illness:    [name] is a 74 y.o. female with a hx of infrequent episodes of paroxysmal atrial fibrillation, well controlled on a combination of flecainide and metoprolol.  She has no known structural heart disease.  She had a normal stress Echo in 2013 and normal Myoview study in 2008.   She was admitted from 8/27-07/19/18 after she developed acute N/V and left hemiparesis following Colonoscopy. She had been off ASA for a couple of days. She was found to have a left occipital-parietal ICH. ASA was held but resumed at DC. On metoprolol and benazepril for HTN. Echo unremarkable. Continued on Flecainide. No Afib noted.   On follow up today she is doing very well. She reports BP is well controlled. She does complain of feeling very tired in the am after taking her pills. last year we stopped her AM dose of metoprolol but this didn't really help.  She did quit smoking 2 years ago. She reports only 2 episodes of Afib this year - the longest lasted 30 minutes.  Past Medical History:  Diagnosis Date  . Dyslipidemia   . GERD (gastroesophageal reflux disease)   . High risk medication use    on Flecainide since January of 2013  . Hypertension   . Obesity   . PAF (paroxysmal atrial fibrillation) (HCC)    normal stress echo in 2011    Past Surgical History:  Procedure Laterality Date  . +    . ABDOMINAL HYSTERECTOMY    . ANKLE SURGERY    . CARDIOVASCULAR STRESS TEST  07/21/2007   EF 80%, NORMAL NONDIAGONISTIC FOR ISCHEMIA  . STRESS ECHO TEST  07/25/2010   EF 60%  . WRIST SURGERY     RIGHT WRIST    Current Medications: Current Meds  Medication Sig  . acetaminophen (TYLENOL) 500 MG tablet Take 500 mg by mouth every  6 (six) hours as needed for mild pain.  Marland Kitchen aspirin 81 MG tablet Take 81 mg by mouth every evening.   . benazepril (LOTENSIN) 10 MG tablet Take 1 tablet (10 mg total) by mouth 2 (two) times daily.  . Cyanocobalamin (B-12) 1000 MCG TABS Take by mouth.  . flecainide (TAMBOCOR) 50 MG tablet TAKE 2 TABLETS (100 MG TOTAL) BY MOUTH EVERY MORNING AND 1 TABLET (50 MG TOTAL) EVERY EVENING.  Marland Kitchen gabapentin (NEURONTIN) 100 MG capsule Take 1 capsule (100 mg total) by mouth at bedtime.  . metoprolol tartrate (LOPRESSOR) 25 MG tablet Take 0.5 tablets (12.5 mg total) by mouth daily at 10 pm.  . nitroGLYCERIN (NITROSTAT) 0.4 MG SL tablet Place 1 tablet (0.4 mg total) under the tongue every 5 (five) minutes as needed for chest pain (x 3 doses).  Marland Kitchen omeprazole (PRILOSEC) 20 MG capsule Take 20 mg by mouth every morning.  . sertraline (ZOLOFT) 50 MG tablet Take 1 tablet (50 mg total) by mouth at bedtime.  . [DISCONTINUED] benazepril (LOTENSIN) 5 MG tablet Take 10 mg by mouth 2 (two) times daily. TOTAL 20MG  10 MG IN THE AM 10 MG IN THE PM     Allergies:   Penicillins, Codeine, and Tape   Social History   Socioeconomic History  . Marital status:  Married    Spouse name: Not on file  . Number of children: Not on file  . Years of education: Not on file  . Highest education level: Not on file  Occupational History  . Not on file  Tobacco Use  . Smoking status: Former Games developer  . Smokeless tobacco: Never Used  Substance and Sexual Activity  . Alcohol use: No  . Drug use: No  . Sexual activity: Yes  Other Topics Concern  . Not on file  Social History Narrative  . Not on file   Social Determinants of Health   Financial Resource Strain: Not on file  Food Insecurity: Not on file  Transportation Needs: Not on file  Physical Activity: Not on file  Stress: Not on file  Social Connections: Not on file     Family History: The patient's family history includes Alzheimer's disease in her mother; Cancer in her  brother; Heart disease in her father; Lung cancer in her brother; Stroke in her sister. ROS:   Please see the history of present illness.     All other systems reviewed and are negative.  EKGs/Labs/Other Studies Reviewed:     Recent Labs: No results found for requested labs within last 8760 hours.  Recent Lipid Panel    Component Value Date/Time   CHOL 97 07/14/2018 0353   TRIG 110 07/14/2018 0353   HDL 26 (L) 07/14/2018 0353   CHOLHDL 3.7 07/14/2018 0353   VLDL 22 07/14/2018 0353   LDLCALC 49 07/14/2018 0353   Dated 09/08/18: creatinine 1.26. Hgb and potassium normal. Dated 06/15/19: CBC, BMET, TSH normal Dated 10/31/20: cholesterol 132, triglycerides 167, HDL 30, LDL 49. Creatinine 1.22. otherwise CMET and CBC normal.   Cardiac studies:  Ecg today shows NSR rate 55, low voltage.  I have personally reviewed and interpreted this study.   Echo 07/15/18: Study Conclusions  - Left ventricle: The cavity size was normal. There was moderate   concentric hypertrophy. Systolic function was vigorous. The   estimated ejection fraction was in the range of 65% to 70%. Wall   motion was normal; there were no regional wall motion   abnormalities. - Aortic valve: There was no regurgitation. - Mitral valve: There was mild regurgitation. - Left atrium: The atrium was moderately dilated. - Right ventricle: The cavity size was normal. Wall thickness was   normal. Systolic function was normal. - Right atrium: The atrium was moderately dilated. - Tricuspid valve: There was moderate regurgitation. - Pulmonary arteries: Systolic pressure was moderately increased.   PA peak pressure: 47 mm Hg (S). - Inferior vena cava: The vessel was normal in size.  Impressions:  - No cardiac source of emboli was indentified.  Physical Exam:    VS:  BP 140/78   Pulse (!) 55   Temp (!) 97.3 F (36.3 C)   Ht 5\' 6"  (1.676 m)   Wt 218 lb 6.4 oz (99.1 kg)   SpO2 98%   BMI 35.25 kg/m     Wt  Readings from Last 3 Encounters:  11/20/20 218 lb 6.4 oz (99.1 kg)  10/04/19 223 lb 9.6 oz (101.4 kg)  10/20/18 202 lb (91.6 kg)     GENERAL:  Well appearing WF in NAD HEENT:  PERRL, EOMI, sclera are clear. Oropharynx is clear. NECK:  No jugular venous distention, carotid upstroke brisk and symmetric, no bruits, no thyromegaly or adenopathy LUNGS:  Clear to auscultation bilaterally CHEST:  Unremarkable HEART:  RRR,  PMI not displaced or sustained,S1  and S2 within normal limits, no S3, no S4: no clicks, no rubs, no murmurs ABD:  Soft, nontender. BS +, no masses or bruits. No hepatomegaly, no splenomegaly EXT:  2 + pulses throughout, no edema, no cyanosis no clubbing SKIN:  Warm and dry.  No rashes NEURO:  Alert and oriented x 3. Cranial nerves II through XII intact. PSYCH:  Cognitively intact    ASSESSMENT:    1. Paroxysmal atrial fibrillation (HCC)   2. Essential hypertension    PLAN:    1. Paroxysmal Afib. Well Controlled on Flecainide. Continue current therapy.  Not a candidate for anticoagulation  due to history of ICH. Continue ASA. If she were to have much more frequent AFib we could consider her for a LAA occlusive device like Watchman. 2. Left parietal ICH 3. HTN. Continue benazepril. 4. Tobacco abuse now stopped.  Follow up in one year.  Signed, Cherlyn Syring [name], MD  11/20/2020 4:05 PM    [facility]

## 2020-11-20 ENCOUNTER — Ambulatory Visit (INDEPENDENT_AMBULATORY_CARE_PROVIDER_SITE_OTHER): Payer: Medicare Other | Admitting: Cardiology

## 2020-11-20 ENCOUNTER — Encounter: Payer: Self-pay | Admitting: Cardiology

## 2020-11-20 ENCOUNTER — Other Ambulatory Visit: Payer: Self-pay

## 2020-11-20 VITALS — BP 140/78 | HR 55 | Temp 97.3°F | Ht 66.0 in | Wt 218.4 lb

## 2020-11-20 DIAGNOSIS — I480 Paroxysmal atrial fibrillation: Secondary | ICD-10-CM

## 2020-11-20 DIAGNOSIS — I10 Essential (primary) hypertension: Secondary | ICD-10-CM

## 2020-11-20 MED ORDER — BENAZEPRIL HCL 10 MG PO TABS: 10.0000 mg | ORAL_TABLET | Freq: Two times a day (BID) | ORAL | Status: AC

## 2020-11-28 DIAGNOSIS — H2512 Age-related nuclear cataract, left eye: Secondary | ICD-10-CM | POA: Diagnosis not present

## 2020-11-28 DIAGNOSIS — H2513 Age-related nuclear cataract, bilateral: Secondary | ICD-10-CM | POA: Diagnosis not present

## 2020-11-29 DIAGNOSIS — H2511 Age-related nuclear cataract, right eye: Secondary | ICD-10-CM | POA: Diagnosis not present

## 2020-11-29 DIAGNOSIS — H25041 Posterior subcapsular polar age-related cataract, right eye: Secondary | ICD-10-CM | POA: Diagnosis not present

## 2020-11-29 DIAGNOSIS — H25011 Cortical age-related cataract, right eye: Secondary | ICD-10-CM | POA: Diagnosis not present

## 2020-12-12 DIAGNOSIS — H2513 Age-related nuclear cataract, bilateral: Secondary | ICD-10-CM | POA: Diagnosis not present

## 2020-12-12 DIAGNOSIS — H2511 Age-related nuclear cataract, right eye: Secondary | ICD-10-CM | POA: Diagnosis not present

## 2020-12-31 ENCOUNTER — Other Ambulatory Visit: Payer: Self-pay | Admitting: Cardiology

## 2020-12-31 DIAGNOSIS — I480 Paroxysmal atrial fibrillation: Secondary | ICD-10-CM

## 2021-01-01 DIAGNOSIS — Z79899 Other long term (current) drug therapy: Secondary | ICD-10-CM | POA: Diagnosis not present

## 2021-01-27 ENCOUNTER — Other Ambulatory Visit: Payer: Self-pay | Admitting: Cardiology

## 2021-01-27 DIAGNOSIS — I480 Paroxysmal atrial fibrillation: Secondary | ICD-10-CM

## 2021-03-10 ENCOUNTER — Other Ambulatory Visit: Payer: Self-pay | Admitting: Cardiology

## 2021-03-10 DIAGNOSIS — I480 Paroxysmal atrial fibrillation: Secondary | ICD-10-CM

## 2021-04-24 DIAGNOSIS — I700 Atherosclerosis of aorta: Secondary | ICD-10-CM | POA: Diagnosis not present

## 2021-04-24 DIAGNOSIS — I480 Paroxysmal atrial fibrillation: Secondary | ICD-10-CM | POA: Diagnosis not present

## 2021-04-24 DIAGNOSIS — Z0000 Encounter for general adult medical examination without abnormal findings: Secondary | ICD-10-CM | POA: Diagnosis not present

## 2021-04-24 DIAGNOSIS — I129 Hypertensive chronic kidney disease with stage 1 through stage 4 chronic kidney disease, or unspecified chronic kidney disease: Secondary | ICD-10-CM | POA: Diagnosis not present

## 2021-04-24 DIAGNOSIS — F325 Major depressive disorder, single episode, in full remission: Secondary | ICD-10-CM | POA: Diagnosis not present

## 2021-04-24 DIAGNOSIS — Z1389 Encounter for screening for other disorder: Secondary | ICD-10-CM | POA: Diagnosis not present

## 2021-04-24 DIAGNOSIS — N1831 Chronic kidney disease, stage 3a: Secondary | ICD-10-CM | POA: Diagnosis not present

## 2021-08-29 DIAGNOSIS — Z20822 Contact with and (suspected) exposure to covid-19: Secondary | ICD-10-CM | POA: Diagnosis not present

## 2021-08-29 DIAGNOSIS — R051 Acute cough: Secondary | ICD-10-CM | POA: Diagnosis not present

## 2021-08-29 DIAGNOSIS — J069 Acute upper respiratory infection, unspecified: Secondary | ICD-10-CM | POA: Diagnosis not present

## 2021-10-24 ENCOUNTER — Ambulatory Visit
Admission: RE | Admit: 2021-10-24 | Discharge: 2021-10-24 | Disposition: A | Discharge status: Home / Self Care | Payer: Medicare Other | Source: Ambulatory Visit | Attending: Geriatric Medicine | Admitting: Geriatric Medicine

## 2021-10-24 ENCOUNTER — Other Ambulatory Visit: Payer: Self-pay | Admitting: Geriatric Medicine

## 2021-10-24 DIAGNOSIS — M545 Low back pain, unspecified: Secondary | ICD-10-CM | POA: Diagnosis not present

## 2021-10-24 DIAGNOSIS — I129 Hypertensive chronic kidney disease with stage 1 through stage 4 chronic kidney disease, or unspecified chronic kidney disease: Secondary | ICD-10-CM | POA: Diagnosis not present

## 2021-10-24 DIAGNOSIS — N1831 Chronic kidney disease, stage 3a: Secondary | ICD-10-CM | POA: Diagnosis not present

## 2021-10-24 DIAGNOSIS — M2578 Osteophyte, vertebrae: Secondary | ICD-10-CM | POA: Diagnosis not present

## 2021-10-24 DIAGNOSIS — I480 Paroxysmal atrial fibrillation: Secondary | ICD-10-CM | POA: Diagnosis not present

## 2021-10-24 DIAGNOSIS — N83201 Unspecified ovarian cyst, right side: Secondary | ICD-10-CM | POA: Diagnosis not present

## 2021-10-24 DIAGNOSIS — Z23 Encounter for immunization: Secondary | ICD-10-CM | POA: Diagnosis not present

## 2021-10-28 DIAGNOSIS — N83201 Unspecified ovarian cyst, right side: Secondary | ICD-10-CM | POA: Diagnosis not present

## 2021-11-01 ENCOUNTER — Other Ambulatory Visit: Payer: Self-pay | Admitting: Cardiology

## 2021-11-01 DIAGNOSIS — I480 Paroxysmal atrial fibrillation: Secondary | ICD-10-CM

## 2021-12-21 ENCOUNTER — Other Ambulatory Visit: Payer: Self-pay | Admitting: Cardiology

## 2021-12-21 DIAGNOSIS — I480 Paroxysmal atrial fibrillation: Secondary | ICD-10-CM

## 2021-12-22 NOTE — Progress Notes (Signed)
Cardiology Office Note:    Date:  12/26/2021   ID:  [name], DOB [birth year], MRN [id number]  PCP:  [name], MD  Cardiologist:  Mirage Pfefferkorn [name], MD    Referring MD: [name], MD   Chief complaint: Follow up atrial fibrillation   History of Present Illness:    [name] is a 76 y.o. female with a hx of infrequent episodes of paroxysmal atrial fibrillation, previously controlled on a combination of flecainide and metoprolol.  She has no known structural heart disease.  She had a normal stress Echo in 2013 and normal Myoview study in 2008.   She was admitted from 8/27-07/19/18 after she developed acute N/V and left hemiparesis following Colonoscopy. She had been off ASA for a couple of days. She was found to have a left occipital-parietal ICH. ASA was held but resumed at DC. On metoprolol and benazepril for HTN. Echo unremarkable. Continued on Flecainide. No Afib noted. Last Ecg in Nov 2020 she was in NSR.   On follow up today she notes that she has intermittent fluttering sensation. Notes feeling of lightheadedness when she takes her medication in the am and has to rest. Does note symptoms of DOE. No edema.   Past Medical History:  Diagnosis Date   Dyslipidemia    GERD (gastroesophageal reflux disease)    High risk medication use    on Flecainide since January of 2013   Hypertension    Obesity    PAF (paroxysmal atrial fibrillation) ([place])    normal stress echo in 2011    Past Surgical History:  Procedure Laterality Date   +     ABDOMINAL HYSTERECTOMY     ANKLE SURGERY     CARDIOVASCULAR STRESS TEST  07/21/2007   EF 80%, NORMAL NONDIAGONISTIC FOR ISCHEMIA   STRESS ECHO TEST  07/25/2010   EF 60%   WRIST SURGERY     RIGHT WRIST    Current Medications: Current Meds  Medication Sig   acetaminophen (TYLENOL) 500 MG tablet Take 500 mg by mouth every 6 (six) hours as needed for mild pain.   aspirin 81 MG tablet Take 81 mg by mouth every evening.     benazepril (LOTENSIN) 10 MG tablet Take 1 tablet (10 mg total) by mouth 2 (two) times daily.   gabapentin (NEURONTIN) 100 MG capsule Take 1 capsule (100 mg total) by mouth at bedtime.   metoprolol tartrate (LOPRESSOR) 25 MG tablet TAKE 0.5 TABLETS (12.5 MG TOTAL) BY MOUTH DAILY AT 10 PM.   nitroGLYCERIN (NITROSTAT) 0.4 MG SL tablet Place 1 tablet (0.4 mg total) under the tongue every 5 (five) minutes as needed for chest pain (x 3 doses).   omeprazole (PRILOSEC) 20 MG capsule Take 20 mg by mouth every morning.   sertraline (ZOLOFT) 50 MG tablet Take 1 tablet (50 mg total) by mouth at bedtime.   [DISCONTINUED] Cyanocobalamin (B-12) 1000 MCG TABS Take by mouth.   [DISCONTINUED] flecainide (TAMBOCOR) 50 MG tablet TAKE 2 TABLETS (100 MG TOTAL) BY MOUTH EVERY MORNING AND 1 TABLET (50 MG TOTAL) EVERY EVENING.     Allergies:   Penicillins, Codeine, and Tape   Social History   Socioeconomic History   Marital status: Married    Spouse name: Not on file   Number of children: Not on file   Years of education: Not on file   Highest education level: Not on file  Occupational History   Not on file  Tobacco Use   Smoking status:  Former   Smokeless tobacco: Never  Substance and Sexual Activity   Alcohol use: No   Drug use: No   Sexual activity: Yes  Other Topics Concern   Not on file  Social History Narrative   Not on file   Social Determinants of Health   Financial Resource Strain: Not on file  Food Insecurity: Not on file  Transportation Needs: Not on file  Physical Activity: Not on file  Stress: Not on file  Social Connections: Not on file     Family History: The patient's family history includes Alzheimer's disease in her mother; Cancer in her brother; Heart disease in her father; Lung cancer in her brother; Stroke in her sister. ROS:   Please see the history of present illness.     All other systems reviewed and are negative.  EKGs/Labs/Other Studies Reviewed:     Recent  Labs: No results found for requested labs within last 8760 hours.  Recent Lipid Panel    Component Value Date/Time   CHOL 97 07/14/2018 0353   TRIG 110 07/14/2018 0353   HDL 26 (L) 07/14/2018 0353   CHOLHDL 3.7 07/14/2018 0353   VLDL 22 07/14/2018 0353   LDLCALC 49 07/14/2018 0353   Dated 09/08/18: creatinine 1.26. Hgb and potassium normal. Dated 06/15/19: CBC, BMET, TSH normal Dated 10/31/20: cholesterol 132, triglycerides 167, HDL 30, LDL 49. Creatinine 1.22. otherwise CMET and CBC normal.  Dated 10/24/21: creatinine 1.19. potassium 5.3. otherwise CMET normal.   Cardiac studies:  Ecg today shows Afib rate 69. Possible septal infarct.  I have personally reviewed and interpreted this study.   Echo 07/15/18: Study Conclusions   - Left ventricle: The cavity size was normal. There was moderate   concentric hypertrophy. Systolic function was vigorous. The   estimated ejection fraction was in the range of 65% to 70%. Wall   motion was normal; there were no regional wall motion   abnormalities. - Aortic valve: There was no regurgitation. - Mitral valve: There was mild regurgitation. - Left atrium: The atrium was moderately dilated. - Right ventricle: The cavity size was normal. Wall thickness was   normal. Systolic function was normal. - Right atrium: The atrium was moderately dilated. - Tricuspid valve: There was moderate regurgitation. - Pulmonary arteries: Systolic pressure was moderately increased.   PA peak pressure: 47 mm Hg (S). - Inferior vena cava: The vessel was normal in size.   Impressions:   - No cardiac source of emboli was indentified.   Physical Exam:    VS:  BP 102/70 (BP Location: Right Arm, Patient Position: Sitting, Cuff Size: Large)    Pulse 69    Ht 5\' 6"  (1.676 m)    Wt 220 lb (99.8 kg)    BMI 35.51 kg/m     Wt Readings from Last 3 Encounters:  12/26/21 220 lb (99.8 kg)  11/20/20 218 lb 6.4 oz (99.1 kg)  10/04/19 223 lb 9.6 oz (101.4 kg)      GENERAL:  Well appearing WF in NAD HEENT:  PERRL, EOMI, sclera are clear. Oropharynx is clear. NECK:  No jugular venous distention, carotid upstroke brisk and symmetric, no bruits, no thyromegaly or adenopathy LUNGS:  Clear to auscultation bilaterally CHEST:  Unremarkable HEART:  IRRR,  PMI not displaced or sustained,S1 and S2 within normal limits, no S3, no S4: no clicks, no rubs, no murmurs ABD:  Soft, nontender. BS +, no masses or bruits. No hepatomegaly, no splenomegaly EXT:  2 + pulses throughout, no  edema, no cyanosis no clubbing SKIN:  Warm and dry.  No rashes NEURO:  Alert and oriented x 3. Cranial nerves II through XII intact. PSYCH:  Cognitively intact    ASSESSMENT:    1. Paroxysmal atrial fibrillation (HCC)   2. Essential hypertension     PLAN:    1. Paroxysmal Afib. Patient has recurrent Afib today with controlled rate. I suspect some of her dyspnea on exertion is related to her Afib. She has breakthrough on Flecainide. Not a candidate for anticoagulation  due to history of ICH. Continue ASA. I would like to refer her for consideration of a Watchman LA occlusive device. Once this is performed we could consider AAD therapy and/or restoration of NSR. Will DC Flecainide for now.  2. Left parietal ICH 3. HTN. Continue benazepril. 4. Tobacco abuse now stopped.  [facility] Referral for Left Atrial Appendage Closure with Non-Valvular Atrial Fibrillation   [name] is a 76 y.o. female is being referred to the [facility] Team for evaluation for Left Atrial Appendage Closure with Watchman device for the management of stroke risk resulting form non-valvular atrial fibrillation.    Base upon Ms. Chapel's history, she is felt to be a poor candidate for long-term anticoagulation because of a history of bleeding (e.g. intracerebral, subdural, GI, retro-peritoneal).  The patient has a HAS-BLED score of 3 indicating a Yearly Major Bleeding  Risk of 3.74%.      Her CHADS2-VASc Score is 5 with an unadjusted Ischemic Stroke Rate (% per year) of 7.2%.    Her stroke risk necessitates a strategy of stroke prevention with either long-term oral anticoagulation or left atrial appendage occlusion therapy. We have discussed their bleeding risk in the context of their comorbid medical problems, as well as the rationale for referral for evaluation of Watchman left atrial appendage occlusion therapy. While the patient is at high long-term bleeding risk, they may be appropriate for short-term anticoagulation. Based on this individual patient's stroke and bleeding risk, a shared decision has been made to refer the patient for consideration of Watchman left atrial appendage closure utilizing the [employer] of Cardiology shared decision tool.   Signed, Trigo Winterbottom [name], MD  12/26/2021 4:57 PM    [place]

## 2021-12-26 ENCOUNTER — Encounter: Payer: Self-pay | Admitting: Cardiology

## 2021-12-26 ENCOUNTER — Other Ambulatory Visit: Payer: Self-pay

## 2021-12-26 ENCOUNTER — Ambulatory Visit (INDEPENDENT_AMBULATORY_CARE_PROVIDER_SITE_OTHER): Payer: Medicare Other | Admitting: Cardiology

## 2021-12-26 VITALS — BP 102/70 | HR 69 | Ht 66.0 in | Wt 220.0 lb

## 2021-12-26 DIAGNOSIS — I480 Paroxysmal atrial fibrillation: Secondary | ICD-10-CM | POA: Diagnosis not present

## 2021-12-26 DIAGNOSIS — I10 Essential (primary) hypertension: Secondary | ICD-10-CM | POA: Diagnosis not present

## 2021-12-26 NOTE — Addendum Note (Signed)
Addended by: Kathyrn Lass on: 12/26/2021 05:00 PM   Modules accepted: Orders

## 2021-12-26 NOTE — Patient Instructions (Addendum)
Stop taking Flecainide   We will arrange evaluation to consider a Watchman device.     Follow up with Dr.Jordan in 3 months

## 2022-01-10 ENCOUNTER — Encounter: Payer: Self-pay | Admitting: Cardiovascular Disease

## 2022-01-10 ENCOUNTER — Ambulatory Visit (INDEPENDENT_AMBULATORY_CARE_PROVIDER_SITE_OTHER): Payer: Medicare Other | Admitting: Cardiovascular Disease

## 2022-01-10 ENCOUNTER — Other Ambulatory Visit: Payer: Self-pay

## 2022-01-10 VITALS — BP 110/76 | HR 61 | Ht 66.0 in | Wt 216.8 lb

## 2022-01-10 DIAGNOSIS — I4819 Other persistent atrial fibrillation: Secondary | ICD-10-CM | POA: Diagnosis not present

## 2022-01-10 DIAGNOSIS — Z01818 Encounter for other preprocedural examination: Secondary | ICD-10-CM

## 2022-01-10 DIAGNOSIS — I4891 Unspecified atrial fibrillation: Secondary | ICD-10-CM

## 2022-01-10 DIAGNOSIS — Z01810 Encounter for preprocedural cardiovascular examination: Secondary | ICD-10-CM

## 2022-01-10 NOTE — Patient Instructions (Signed)
Medication Instructions:  Your physician recommends that you continue on your current medications as directed. Please refer to the Current Medication list given to you today.  *If you need a refill on your cardiac medications before your next appointment, please call your pharmacy*   Lab Work: BMET, CBC today If you have labs (blood work) drawn today and your tests are completely normal, you will receive your results only by: [place] (if you have MyChart) OR A paper copy in the mail If you have any lab test that is abnormal or we need to change your treatment, we will call you to review the results.   Testing/Procedures: NONE- LAAO procedure pending neuro clearance   Follow-Up: Via structural team

## 2022-01-10 NOTE — Progress Notes (Signed)
Watchman Consult Note   Date:  01/10/2022   ID:  [name], DOB 07/11/1946, MRN [id number]  PCP:  [name], MD  Cardiologist:  Peter [name], MD Primary Electrophysiologist: none Referring Physician: Peter [name], MD   CC: to discuss Watchman implant    History of Present Illness: [name] is a 76 y.o. female referred by Dr [name] for evaluation of atrial fibrillation and stroke prevention. She has persistent atrial fibrillation.  The patient has been evaluated by their referring physician and is felt to be a poor candidate for long term [place] due to history of intracerebral hemorrhage.  She therefore presents today for Watchman evaluation.   The patient is here with her daughter-in-law today.  She has a history of paroxysmal atrial fibrillation but has not been on anticoagulation because of a left occipital-parietal anterior cranial hemorrhage in 2019.  She has been in normal sinus rhythm until recently when she was evaluated as an outpatient by [name] on December 26, 2021.  At that time she was noted to be in atrial fibrillation.  She has had occasional fluttering in her chest when she lies down in the evening.  She also has had shortness of breath with physical activity.  No chest pain, chest pressure, leg swelling, orthopnea, or PND.  She is continued on aspirin 81 mg daily.  Now that she is found to be back in atrial fibrillation, she is referred for consideration of watchman implantation in order to minimize her duration of anticoagulation in the context of her prior intracranial hemorrhage.   Past Medical History:  Diagnosis Date   Dyslipidemia    GERD (gastroesophageal reflux disease)    High risk medication use    on Flecainide since January of 2013   Hypertension    Obesity    PAF (paroxysmal atrial fibrillation) ([place])    normal stress echo in 2011   Past Surgical History:  Procedure Laterality Date   +     ABDOMINAL HYSTERECTOMY     ANKLE  SURGERY     CARDIOVASCULAR STRESS TEST  07/21/2007   EF 80%, NORMAL NONDIAGONISTIC FOR ISCHEMIA   STRESS ECHO TEST  07/25/2010   EF 60%   WRIST SURGERY     RIGHT WRIST     Current Outpatient Medications  Medication Sig Dispense Refill   acetaminophen (TYLENOL) 500 MG tablet Take 500 mg by mouth every 6 (six) hours as needed for mild pain.     aspirin 81 MG tablet Take 81 mg by mouth every evening.      benazepril (LOTENSIN) 10 MG tablet Take 1 tablet (10 mg total) by mouth 2 (two) times daily.     gabapentin (NEURONTIN) 100 MG capsule Take 1 capsule (100 mg total) by mouth at bedtime.     metoprolol tartrate (LOPRESSOR) 25 MG tablet TAKE 0.5 TABLETS (12.5 MG TOTAL) BY MOUTH DAILY AT 10 PM. 15 tablet 9   nitroGLYCERIN (NITROSTAT) 0.4 MG SL tablet Place 1 tablet (0.4 mg total) under the tongue every 5 (five) minutes as needed for chest pain (x 3 doses). 25 tablet 3   omeprazole (PRILOSEC) 20 MG capsule Take 20 mg by mouth every morning.     sertraline (ZOLOFT) 50 MG tablet Take 1 tablet (50 mg total) by mouth at bedtime.     amoxicillin (AMOXIL) 500 MG capsule Take by mouth.     No current facility-administered medications for this visit.    Allergies:   Penicillins, Codeine, and  Tape   Social History:  The patient  reports that she has quit smoking. She has never used smokeless tobacco. She reports that she does not drink alcohol and does not use drugs.   Family History:  The patient's family history includes Alzheimer's disease in her mother; Cancer in her brother; Heart disease in her father; Lung cancer in her brother; Stroke in her sister.    ROS:  Please see the history of present illness.   All other systems are reviewed and negative.    PHYSICAL EXAM: VS:  BP 110/76    Pulse 61    Ht 5\' 6"  (1.676 m)    Wt 216 lb 12.8 oz (98.3 kg)    SpO2 98%    BMI 34.99 kg/m  , BMI Body mass index is 34.99 kg/m. GEN: Well nourished, well developed, in no acute distress  HEENT: normal   Neck: no JVD, carotid bruits, or masses Cardiac: Irregularly irregular; no murmurs, rubs, or gallops,no edema  Respiratory:  clear to auscultation bilaterally, normal work of breathing GI: soft, nontender, nondistended, + BS MS: no deformity or atrophy  Skin: warm and dry  Neuro:  Strength and sensation are intact Psych: euthymic mood, full affect  EKG:  EKG is not ordered today. The ekg ordered today shows atrial flutter with variable AV block, heart rate 69 bpm   Recent Labs: No results found for requested labs within last 8760 hours.    Lipid Panel     Component Value Date/Time   CHOL 97 07/14/2018 0353   TRIG 110 07/14/2018 0353   HDL 26 (L) 07/14/2018 0353   CHOLHDL 3.7 07/14/2018 0353   VLDL 22 07/14/2018 0353   LDLCALC 49 07/14/2018 0353     Wt Readings from Last 3 Encounters:  01/10/22 216 lb 12.8 oz (98.3 kg)  12/26/21 220 lb (99.8 kg)  11/20/20 218 lb 6.4 oz (99.1 kg)      Other studies Reviewed: Additional studies/ records that were reviewed today include:  2D echocardiogram 07/15/2018: Study Conclusions   - Left ventricle: The cavity size was normal. There was moderate    concentric hypertrophy. Systolic function was vigorous. The    estimated ejection fraction was in the range of 65% to 70%. Wall    motion was normal; there were no regional wall motion    abnormalities.  - Aortic valve: There was no regurgitation.  - Mitral valve: There was mild regurgitation.  - Left atrium: The atrium was moderately dilated.  - Right ventricle: The cavity size was normal. Wall thickness was    normal. Systolic function was normal.  - Right atrium: The atrium was moderately dilated.  - Tricuspid valve: There was moderate regurgitation.  - Pulmonary arteries: Systolic pressure was moderately increased.    PA peak pressure: 47 mm Hg (S).  - Inferior vena cava: The vessel was normal in size.     ASSESSMENT AND PLAN:  1.  Persistent atrial fibrillation I have  seen [name] is a 76 y.o. female in the office today who has been referred for a Watchman left atrial appendage closure device.  She has a history of persistent atrial fibrillation.  This patients CHA2DS2-VASc Score and unadjusted Ischemic Stroke Rate (% per year) is equal to 7.2 % stroke rate/year from a score of 5 which necessitates long term oral anticoagulation to prevent stroke.    CHA2DS2-VASc Score = 5   This indicates a 7.2% annual risk of stroke. The patient's score  is based upon: CHF History: 0 HTN History: 1 Diabetes History: 0 Stroke History: 2 Vascular Disease History: 0 Age Score: 1 Gender Score: 1      HAS-BLED score  Hypertension No  Abnormal renal and liver function (Dialysis, transplant, Cr >2.26 mg/dL /Cirrhosis or Bilirubin >2x Normal or AST/ALT/AP >3x Normal) No  Stroke Yes  Bleeding Yes  Labile INR (Unstable/high INR) No  Elderly (>65) Yes  Drugs or alcohol (? 8 drinks/week, anti-plt or NSAID) No   She is not felt to be a long term anticoagulation candidate secondary to prior intracranial hemorrhage.  The patients chart has been reviewed and I along with their referring cardiologist feel that they would be a candidate for short term oral anticoagulation.  I think it is important to confirm with neurology that she would be a candidate for short-term anticoagulation with low-dose apixaban 2.5 mg twice daily for 6 weeks followed by antiplatelet therapy with clopidogrel for up to 6 months.  She has seen Dr. Leonie Man and Frann Rider in the [facility] neurology office after her intracerebral hemorrhage, last seen in 2020.  These notes are reviewed today.  Procedural risks for the Watchman implant have been reviewed with the patient including a 1% risk of stroke, <1% risk of perforation or pericardial effusion, 0.1% risk of device embolization.  We also discussed potential issues such as device related thrombus and persistent peridevice leak on follow-up imaging.   Given the patient's poor candidacy for long-term oral anticoagulation, ability to tolerate short term oral anticoagulation, I have recommended the Watchman FLX left atrial appendage closure device through a shared decision making conversation with the patient.  A procedural animation of watchman flx left atrial appendage occlusion is demonstrated to the patient today.  Procedural steps are discussed and potential complications.  Expected recovery is also discussed.  Prior to the procedure, I would like to obtain a gated CT scan of the chest with contrast timed for PV/LA visualization.   Once the cardiac CTA and outpatient neurology consultation are completed, I will review to confirm the patient continues to be a suitable candidate for Watchman implant. If so, we will schedule for the implant procedure at the first available date.  Current medicines are reviewed at length with the patient today.   The patient does not have concerns regarding her medicines.  The following changes were made today:  none  Labs/ tests ordered today include:  Orders Placed This Encounter  Procedures   Basic metabolic panel   CBC     Signed, [name], MD 01/10/2022  1:07 PM     [facility] [address] [place] [address] [phone] (office) [phone] (fax)

## 2022-01-10 NOTE — Progress Notes (Signed)
Called and spoke to patient regarding Watchman CT instructions. Mailed them to her as well. Pt states she understands.

## 2022-01-11 LAB — BASIC METABOLIC PANEL
BUN/Creatinine Ratio: 21 (ref 12–28)
BUN: 21 mg/dL (ref 8–27)
CO2: 22 mmol/L (ref 20–29)
Calcium: 10.1 mg/dL (ref 8.7–10.3)
Chloride: 102 mmol/L (ref 96–106)
Creatinine, Ser: 0.98 mg/dL (ref 0.57–1.00)
Glucose: 91 mg/dL (ref 70–99)
Potassium: 4.9 mmol/L (ref 3.5–5.2)
Sodium: 139 mmol/L (ref 134–144)
eGFR: 61 mL/min/{1.73_m2} (ref >59)

## 2022-01-11 LAB — CBC
Hematocrit: 46.7 % — ABNORMAL HIGH (ref 34.0–46.6)
Hemoglobin: 15.7 g/dL (ref 11.1–15.9)
MCH: 29.2 pg (ref 26.6–33.0)
MCHC: 33.6 g/dL (ref 31.5–35.7)
MCV: 87 fL (ref 79–97)
Platelets: 234 10*3/uL (ref 150–450)
RBC: 5.38 x10E6/uL — ABNORMAL HIGH (ref 3.77–5.28)
RDW: 13.2 % (ref 11.7–15.4)
WBC: 7.1 10*3/uL (ref 3.4–10.8)

## 2022-01-14 ENCOUNTER — Telehealth: Payer: Self-pay

## 2022-01-14 NOTE — Telephone Encounter (Signed)
-----   Message from [name], MD sent at 01/14/2022 10:38 AM EST ----- Thx Pramod. Valetta Fuller can you go ahead and set her up for Watchman CTA?  ----- Message ----- From: Garvin Fila, MD Sent: 01/14/2022   8:43 AM EST To: [name], MD  Legrand Como intracerebral hemorrhage was nearly 2 years ago I think it is okay to use short-term anticoagulation for the Watchman procedure ----- Message ----- From: [name], MD Sent: 01/10/2022   1:11 PM EST To: Garvin Fila, MD, Peter M [name], MD, #  Collier [place] - thanks for referring Ms Dennard Schaumann for [facility] consult. I think she is appropriate to do, but going to refer back to Dr Leonie Man to make sure short term anticoagulation is acceptable.   Ronalee Belts

## 2022-01-14 NOTE — Telephone Encounter (Signed)
-----   Message from Garvin Fila, MD sent at 01/14/2022  9:00 AM EST ----- Patient is neurologically cleared for Watchman procedure and may be started on short-term anticoagulation postprocedure. ----- Message ----- From: Ma Hillock Sent: 01/13/2022   9:24 AM EST To: Garvin Fila, MD  ATTN: JESSICA MCCUE ##NEED SURGICAL CLEARANCE r/t need for short-term anticoagulation prior to watchman procedure!

## 2022-01-14 NOTE — Telephone Encounter (Signed)
Routed to [name] Kemp 

## 2022-01-14 NOTE — Telephone Encounter (Signed)
Scheduled the patient for Watchman CT 01/24/2022.  Instructions reviewed and sent thru [place]. She was grateful for call and agrees with plan.

## 2022-01-14 NOTE — Telephone Encounter (Signed)
Left message to call back to arrange cCT.

## 2022-01-22 ENCOUNTER — Telehealth (HOSPITAL_COMMUNITY): Payer: Self-pay | Admitting: Emergency Medicine

## 2022-01-22 NOTE — Telephone Encounter (Signed)
Reaching out to patient to offer assistance regarding upcoming cardiac imaging study; pt verbalizes understanding of appt date/time, parking situation and where to check in, pre-test NPO status and medications ordered, and verified current allergies; name and call back number provided for further questions should they arise ?Marchia Bond RN Navigator Cardiac Imaging ?[facility] ?[phone] office ?[phone] cell ? ?Denies iv issues ?12.'5mg'$  metoprolol succinate daily  ?Arrival 900 ?

## 2022-01-24 ENCOUNTER — Other Ambulatory Visit: Payer: Self-pay

## 2022-01-24 ENCOUNTER — Ambulatory Visit (HOSPITAL_COMMUNITY)
Admission: RE | Admit: 2022-01-24 | Discharge: 2022-01-24 | Disposition: A | Discharge status: Home / Self Care | Payer: Medicare Other | Source: Ambulatory Visit | Attending: Cardiovascular Disease | Admitting: Cardiovascular Disease

## 2022-01-24 DIAGNOSIS — I4891 Unspecified atrial fibrillation: Secondary | ICD-10-CM | POA: Insufficient documentation

## 2022-01-24 MED ORDER — METOPROLOL TARTRATE 5 MG/5ML IV SOLN
10.0000 mg | Freq: Once | INTRAVENOUS | Status: AC
  Administered 2022-01-24: 10 mg via INTRAVENOUS

## 2022-01-24 MED ORDER — METOPROLOL TARTRATE 5 MG/5ML IV SOLN
INTRAVENOUS | Status: AC
  Filled 2022-01-24: qty 10

## 2022-01-24 MED ORDER — IOHEXOL 350 MG/ML SOLN
100.0000 mL | Freq: Once | INTRAVENOUS | Status: AC | PRN
  Administered 2022-01-24: 100 mL via INTRAVENOUS

## 2022-01-30 ENCOUNTER — Encounter: Payer: Self-pay | Admitting: Cardiovascular Disease

## 2022-02-04 ENCOUNTER — Other Ambulatory Visit: Payer: Self-pay

## 2022-02-04 DIAGNOSIS — I4819 Other persistent atrial fibrillation: Secondary | ICD-10-CM

## 2022-02-04 DIAGNOSIS — I612 Nontraumatic intracerebral hemorrhage in hemisphere, unspecified: Secondary | ICD-10-CM

## 2022-02-04 NOTE — Telephone Encounter (Signed)
Reviewed results with patient who verbalized understanding.   ?The patient will have pre-procedure visit with Kathyrn Drown on 03/12/2022 in preparation for LAAO procedure 03/27/2022. ?She has several dental procedures in April and will call if the process changes or LAAO will be delayed.  ?She was grateful for assistance and agrees with plan.  ?

## 2022-03-11 NOTE — Progress Notes (Signed)
?[facility]   ?                                  ?Cardiology Office Note:   ? ?Date:  03/12/2022  ? ?ID:  [name], DOB 1945/11/28, MRN [id number] ? ?PCP:  Garvin Fila, MD  ?[place] Cardiologist:  None  ?[place] Electrophysiologist:  None  ? ?Referring MD: Garvin Fila, MD  ? ?Chief Complaint  ?Patient presents with  ? Follow-up  ?  Pre Watchman  ? ?History of Present Illness:   ? ?[name] is a 76 y.o. female with a hx of persistent atrial fibrillation not on AC due to hx of left occipital-parietal anterior cranial hemorrhage in 2019, HLD, GERD, HTN, and obesity.  ? ?[name] was referred to [name] for possible Watchman implant from [name] for long term stroke prevention. She has persistent atrial fibrillation and is felt to be a poor candidate for long term [place] due to history of intracerebral hemorrhage. She was previously in PAF however more recently was evaluated by [name] and found to be in atrial fibrillation. At that time, she reported occasional fluttering in her chest along with shortness of breath with physical activity. After discussion, the patient was willing to proceed with further workup. CT performed 01/24/22 which showed anatomy suitable for Watchman implant.  ? ?Today she is here alone and states that she has been doing well from a CV standpoint. She denies SOB, palpitations, LE edema, dizziness, neuro changes, orthopnea, or syncope. Watchman procedure discussed with all questions answers to the best of my ability.  ? ?Past Medical History:  ?Diagnosis Date  ? Dyslipidemia   ? GERD (gastroesophageal reflux disease)   ? High risk medication use   ? on Flecainide since January of 2013  ? Hypertension   ? Obesity   ? PAF (paroxysmal atrial fibrillation) ([place])   ? normal stress echo in 2011  ? ?Past Surgical History:  ?Procedure Laterality Date  ? +    ? ABDOMINAL HYSTERECTOMY    ? ANKLE SURGERY    ? CARDIOVASCULAR STRESS TEST   07/21/2007  ? EF 80%, NORMAL NONDIAGONISTIC FOR ISCHEMIA  ? STRESS ECHO TEST  07/25/2010  ? EF 60%  ? WRIST SURGERY    ? RIGHT WRIST  ? ?Current Medications: ?Current Meds  ?Medication Sig  ? acetaminophen (TYLENOL) 500 MG tablet Take 500 mg by mouth every 6 (six) hours as needed for mild pain.  ? apixaban (ELIQUIS) 2.5 MG TABS tablet Take 1 tablet (2.5 mg total) by mouth 2 (two) times daily.  ? aspirin 81 MG tablet Take 81 mg by mouth every evening.   ? benazepril (LOTENSIN) 10 MG tablet Take 1 tablet (10 mg total) by mouth 2 (two) times daily.  ? gabapentin (NEURONTIN) 100 MG capsule Take 1 capsule (100 mg total) by mouth at bedtime.  ? metoprolol tartrate (LOPRESSOR) 25 MG tablet TAKE 0.5 TABLETS (12.5 MG TOTAL) BY MOUTH DAILY AT 10 PM.  ? nitroGLYCERIN (NITROSTAT) 0.4 MG SL tablet Place 1 tablet (0.4 mg total) under the tongue every 5 (five) minutes as needed for chest pain (x 3 doses).  ? omeprazole (PRILOSEC) 20 MG capsule Take 20 mg by mouth every morning.  ? sertraline (ZOLOFT) 100 MG tablet Take 50 mg by mouth at bedtime.  ?  ?Allergies:   Penicillins, Codeine, and Tape  ? ?  Social History  ? ?Socioeconomic History  ? Marital status: Widowed  ?  Spouse name: Not on file  ? Number of children: Not on file  ? Years of education: Not on file  ? Highest education level: Not on file  ?Occupational History  ? Not on file  ?Tobacco Use  ? Smoking status: Former  ? Smokeless tobacco: Never  ?Substance and Sexual Activity  ? Alcohol use: No  ? Drug use: No  ? Sexual activity: Yes  ?Other Topics Concern  ? Not on file  ?Social History Narrative  ? Not on file  ? ?Social Determinants of Health  ? ?Financial Resource Strain: Not on file  ?Food Insecurity: Not on file  ?Transportation Needs: Not on file  ?Physical Activity: Not on file  ?Stress: Not on file  ?Social Connections: Not on file  ?  ? ?Family History: ?The patient's family history includes Alzheimer's disease in her mother; Cancer in her brother; Heart disease in  her father; Lung cancer in her brother; Stroke in her sister. ? ?ROS:   ?Please see the history of present illness.    ?All other systems reviewed and are negative. ? ?EKGs/Labs/Other Studies Reviewed:   ? ?The following studies were reviewed today: ? ?Echocardiogram 07/15/2018: ?Study Conclusions  ? ?- Left ventricle: The cavity size was normal. There was moderate  ?  concentric hypertrophy. Systolic function was vigorous. The  ?  estimated ejection fraction was in the range of 65% to 70%. Wall  ?  motion was normal; there were no regional wall motion  ?  abnormalities.  ?- Aortic valve: There was no regurgitation.  ?- Mitral valve: There was mild regurgitation.  ?- Left atrium: The atrium was moderately dilated.  ?- Right ventricle: The cavity size was normal. Wall thickness was  ?  normal. Systolic function was normal.  ?- Right atrium: The atrium was moderately dilated.  ?- Tricuspid valve: There was moderate regurgitation.  ?- Pulmonary arteries: Systolic pressure was moderately increased.  ?  PA peak pressure: 47 mm Hg (S).  ?- Inferior vena cava: The vessel was normal in size.  ? ?CT cardiac 01/24/22: ? ?IMPRESSION: ?1. There is normal variant pulmonary vein drainage into the left ?atrium. ?  ?2. The left atrial appendage is patent. ?  ?3. Watchman Flx maximal landing zone diameter notable for 25 mm ?landing zone diameter with requisite measurements as above. ?  ?4. Coronary calcium score of 1, which is 29th percentile for age, ?sex, and race matched control. ?  ?EKG:  EKG is ordered today.  The ekg ordered today demonstrates AF with HR 74bpm ? ?Recent Labs: ?01/10/2022: BUN 21; Creatinine, Ser 0.98; Hemoglobin 15.7; Platelets 234; Potassium 4.9; Sodium 139  ?Recent Lipid Panel ?   ?Component Value Date/Time  ? CHOL 97 07/14/2018 0353  ? TRIG 110 07/14/2018 0353  ? HDL 26 (L) 07/14/2018 0353  ? CHOLHDL 3.7 07/14/2018 0353  ? VLDL 22 07/14/2018 0353  ? LDLCALC 49 07/14/2018 0353  ? ?Risk Assessment/Calculations:    ? ?CHA2DS2-VASc Score = 5  ? This indicates a 7.2% annual risk of stroke. ?The patient's score is based upon: ?CHF History: 0 ?HTN History: 1 ?Diabetes History: 0 ?Stroke History: 2 ?Vascular Disease History: 0 ?Age Score: 1 ?Gender Score: 1 ?  ?HAS-BLED score  ?Hypertension No  ?Abnormal renal and liver function (Dialysis, transplant, Cr >2.26 mg/dL /Cirrhosis or Bilirubin >2x Normal or AST/ALT/AP >3x Normal) No  ?Stroke Yes  ?Bleeding Yes  ?  Labile INR (Unstable/high INR) No  ?Elderly (>65) Yes  ?Drugs or alcohol (? 8 drinks/week, anti-plt or NSAID) No  ? ?Physical Exam:   ? ?VS:  BP 120/80   Pulse 74   Ht '5\' 6"'$  (1.676 m)   Wt 219 lb (99.3 kg)   SpO2 97%   BMI 35.35 kg/m?    ? ?Wt Readings from Last 3 Encounters:  ?03/12/22 219 lb (99.3 kg)  ?01/10/22 216 lb 12.8 oz (98.3 kg)  ?12/26/21 220 lb (99.8 kg)  ?  ?General: Well developed, well nourished, NAD ?Lungs:Clear to ausculation bilaterally. No wheezes, rales, or rhonchi. Breathing is unlabored. ?Cardiovascular: Irregularly irregular. No murmurs ?Extremities: No edema.  ?Neuro: Alert and oriented. No focal deficits. No facial asymmetry. MAE spontaneously. ?Psych: Responds to questions appropriately with normal affect.   ? ?ASSESSMENT/PLAN:   ? ?Persistent atrial fibrillation: Patient had a left occipital-parietal anterior cranial hemorrhage in 2019. She was previously diagnosed with paroxysmal AF and was not on anticoagulation due to the above. More recently she was found to be in persistent AF and was referred to [name] for possible Watchman implant. CT showed anatomy suitable for implant. She is scheduled for 03/27/22. We will start Eliquis 2.'5mg'$  BID 5 days prior to her procedure. She will monitor for s/s of bleeding. Obtain BMET, CBC today. Will need 6 months SBE post implant. Discussed today>>to RX after implant. Patient prefers to stay overnight after procedure.  ? ?CVA: No neuro changes. Continue current regimen  ? ?Medication Adjustments/Labs and  Tests Ordered: ?Current medicines are reviewed at length with the patient today.  Concerns regarding medicines are outlined above.  ?Orders Placed This Encounter  ?Procedures  ? Basic metabolic panel  ? CBC  ?

## 2022-03-11 NOTE — H&P (View-Only) (Signed)
?[facility]   ?                                  ?Cardiology Office Note:   ? ?Date:  03/12/2022  ? ?ID:  [name], DOB 26-May-1946, MRN [id number] ? ?PCP:  Garvin Fila, MD  ?[place] Cardiologist:  None  ?[place] Electrophysiologist:  None  ? ?Referring MD: Garvin Fila, MD  ? ?Chief Complaint  ?Patient presents with  ? Follow-up  ?  Pre Watchman  ? ?History of Present Illness:   ? ?[name] is a 76 y.o. female with a hx of persistent atrial fibrillation not on AC due to hx of left occipital-parietal anterior cranial hemorrhage in 2019, HLD, GERD, HTN, and obesity.  ? ?[name] was referred to [name] for possible Watchman implant from [name] for long term stroke prevention. She has persistent atrial fibrillation and is felt to be a poor candidate for long term [place] due to history of intracerebral hemorrhage. She was previously in PAF however more recently was evaluated by [name] and found to be in atrial fibrillation. At that time, she reported occasional fluttering in her chest along with shortness of breath with physical activity. After discussion, the patient was willing to proceed with further workup. CT performed 01/24/22 which showed anatomy suitable for Watchman implant.  ? ?Today she is here alone and states that she has been doing well from a CV standpoint. She denies SOB, palpitations, LE edema, dizziness, neuro changes, orthopnea, or syncope. Watchman procedure discussed with all questions answers to the best of my ability.  ? ?Past Medical History:  ?Diagnosis Date  ? Dyslipidemia   ? GERD (gastroesophageal reflux disease)   ? High risk medication use   ? on Flecainide since January of 2013  ? Hypertension   ? Obesity   ? PAF (paroxysmal atrial fibrillation) ([place])   ? normal stress echo in 2011  ? ?Past Surgical History:  ?Procedure Laterality Date  ? +    ? ABDOMINAL HYSTERECTOMY    ? ANKLE SURGERY    ? CARDIOVASCULAR STRESS TEST   07/21/2007  ? EF 80%, NORMAL NONDIAGONISTIC FOR ISCHEMIA  ? STRESS ECHO TEST  07/25/2010  ? EF 60%  ? WRIST SURGERY    ? RIGHT WRIST  ? ?Current Medications: ?Current Meds  ?Medication Sig  ? acetaminophen (TYLENOL) 500 MG tablet Take 500 mg by mouth every 6 (six) hours as needed for mild pain.  ? apixaban (ELIQUIS) 2.5 MG TABS tablet Take 1 tablet (2.5 mg total) by mouth 2 (two) times daily.  ? aspirin 81 MG tablet Take 81 mg by mouth every evening.   ? benazepril (LOTENSIN) 10 MG tablet Take 1 tablet (10 mg total) by mouth 2 (two) times daily.  ? gabapentin (NEURONTIN) 100 MG capsule Take 1 capsule (100 mg total) by mouth at bedtime.  ? metoprolol tartrate (LOPRESSOR) 25 MG tablet TAKE 0.5 TABLETS (12.5 MG TOTAL) BY MOUTH DAILY AT 10 PM.  ? nitroGLYCERIN (NITROSTAT) 0.4 MG SL tablet Place 1 tablet (0.4 mg total) under the tongue every 5 (five) minutes as needed for chest pain (x 3 doses).  ? omeprazole (PRILOSEC) 20 MG capsule Take 20 mg by mouth every morning.  ? sertraline (ZOLOFT) 100 MG tablet Take 50 mg by mouth at bedtime.  ?  ?Allergies:   Penicillins, Codeine, and Tape  ? ?  Social History  ? ?Socioeconomic History  ? Marital status: Widowed  ?  Spouse name: Not on file  ? Number of children: Not on file  ? Years of education: Not on file  ? Highest education level: Not on file  ?Occupational History  ? Not on file  ?Tobacco Use  ? Smoking status: Former  ? Smokeless tobacco: Never  ?Substance and Sexual Activity  ? Alcohol use: No  ? Drug use: No  ? Sexual activity: Yes  ?Other Topics Concern  ? Not on file  ?Social History Narrative  ? Not on file  ? ?Social Determinants of Health  ? ?Financial Resource Strain: Not on file  ?Food Insecurity: Not on file  ?Transportation Needs: Not on file  ?Physical Activity: Not on file  ?Stress: Not on file  ?Social Connections: Not on file  ?  ? ?Family History: ?The patient's family history includes Alzheimer's disease in her mother; Cancer in her brother; Heart disease in  her father; Lung cancer in her brother; Stroke in her sister. ? ?ROS:   ?Please see the history of present illness.    ?All other systems reviewed and are negative. ? ?EKGs/Labs/Other Studies Reviewed:   ? ?The following studies were reviewed today: ? ?Echocardiogram 07/15/2018: ?Study Conclusions  ? ?- Left ventricle: The cavity size was normal. There was moderate  ?  concentric hypertrophy. Systolic function was vigorous. The  ?  estimated ejection fraction was in the range of 65% to 70%. Wall  ?  motion was normal; there were no regional wall motion  ?  abnormalities.  ?- Aortic valve: There was no regurgitation.  ?- Mitral valve: There was mild regurgitation.  ?- Left atrium: The atrium was moderately dilated.  ?- Right ventricle: The cavity size was normal. Wall thickness was  ?  normal. Systolic function was normal.  ?- Right atrium: The atrium was moderately dilated.  ?- Tricuspid valve: There was moderate regurgitation.  ?- Pulmonary arteries: Systolic pressure was moderately increased.  ?  PA peak pressure: 47 mm Hg (S).  ?- Inferior vena cava: The vessel was normal in size.  ? ?CT cardiac 01/24/22: ? ?IMPRESSION: ?1. There is normal variant pulmonary vein drainage into the left ?atrium. ?  ?2. The left atrial appendage is patent. ?  ?3. Watchman Flx maximal landing zone diameter notable for 25 mm ?landing zone diameter with requisite measurements as above. ?  ?4. Coronary calcium score of 1, which is 29th percentile for age, ?sex, and race matched control. ?  ?EKG:  EKG is ordered today.  The ekg ordered today demonstrates AF with HR 74bpm ? ?Recent Labs: ?01/10/2022: BUN 21; Creatinine, Ser 0.98; Hemoglobin 15.7; Platelets 234; Potassium 4.9; Sodium 139  ?Recent Lipid Panel ?   ?Component Value Date/Time  ? CHOL 97 07/14/2018 0353  ? TRIG 110 07/14/2018 0353  ? HDL 26 (L) 07/14/2018 0353  ? CHOLHDL 3.7 07/14/2018 0353  ? VLDL 22 07/14/2018 0353  ? LDLCALC 49 07/14/2018 0353  ? ?Risk Assessment/Calculations:    ? ?CHA2DS2-VASc Score = 5  ? This indicates a 7.2% annual risk of stroke. ?The patient's score is based upon: ?CHF History: 0 ?HTN History: 1 ?Diabetes History: 0 ?Stroke History: 2 ?Vascular Disease History: 0 ?Age Score: 1 ?Gender Score: 1 ?  ?HAS-BLED score  ?Hypertension No  ?Abnormal renal and liver function (Dialysis, transplant, Cr >2.26 mg/dL /Cirrhosis or Bilirubin >2x Normal or AST/ALT/AP >3x Normal) No  ?Stroke Yes  ?Bleeding Yes  ?  Labile INR (Unstable/high INR) No  ?Elderly (>65) Yes  ?Drugs or alcohol (? 8 drinks/week, anti-plt or NSAID) No  ? ?Physical Exam:   ? ?VS:  BP 120/80   Pulse 74   Ht '5\' 6"'$  (1.676 m)   Wt 219 lb (99.3 kg)   SpO2 97%   BMI 35.35 kg/m?    ? ?Wt Readings from Last 3 Encounters:  ?03/12/22 219 lb (99.3 kg)  ?01/10/22 216 lb 12.8 oz (98.3 kg)  ?12/26/21 220 lb (99.8 kg)  ?  ?General: Well developed, well nourished, NAD ?Lungs:Clear to ausculation bilaterally. No wheezes, rales, or rhonchi. Breathing is unlabored. ?Cardiovascular: Irregularly irregular. No murmurs ?Extremities: No edema.  ?Neuro: Alert and oriented. No focal deficits. No facial asymmetry. MAE spontaneously. ?Psych: Responds to questions appropriately with normal affect.   ? ?ASSESSMENT/PLAN:   ? ?Persistent atrial fibrillation: Patient had a left occipital-parietal anterior cranial hemorrhage in 2019. She was previously diagnosed with paroxysmal AF and was not on anticoagulation due to the above. More recently she was found to be in persistent AF and was referred to [name] for possible Watchman implant. CT showed anatomy suitable for implant. She is scheduled for 03/27/22. We will start Eliquis 2.'5mg'$  BID 5 days prior to her procedure. She will monitor for s/s of bleeding. Obtain BMET, CBC today. Will need 6 months SBE post implant. Discussed today>>to RX after implant. Patient prefers to stay overnight after procedure.  ? ?CVA: No neuro changes. Continue current regimen  ? ?Medication Adjustments/Labs and  Tests Ordered: ?Current medicines are reviewed at length with the patient today.  Concerns regarding medicines are outlined above.  ?Orders Placed This Encounter  ?Procedures  ? Basic metabolic panel  ? CBC  ?

## 2022-03-12 ENCOUNTER — Ambulatory Visit (INDEPENDENT_AMBULATORY_CARE_PROVIDER_SITE_OTHER): Payer: Medicare Other | Admitting: Cardiology

## 2022-03-12 VITALS — BP 120/80 | HR 74 | Ht 66.0 in | Wt 219.0 lb

## 2022-03-12 DIAGNOSIS — Z01810 Encounter for preprocedural cardiovascular examination: Secondary | ICD-10-CM

## 2022-03-12 DIAGNOSIS — I639 Cerebral infarction, unspecified: Secondary | ICD-10-CM | POA: Diagnosis not present

## 2022-03-12 DIAGNOSIS — I612 Nontraumatic intracerebral hemorrhage in hemisphere, unspecified: Secondary | ICD-10-CM | POA: Diagnosis not present

## 2022-03-12 DIAGNOSIS — I4819 Other persistent atrial fibrillation: Secondary | ICD-10-CM

## 2022-03-12 LAB — BASIC METABOLIC PANEL
BUN/Creatinine Ratio: 17 (ref 12–28)
BUN: 19 mg/dL (ref 8–27)
CO2: 27 mmol/L (ref 20–29)
Calcium: 10.6 mg/dL — ABNORMAL HIGH (ref 8.7–10.3)
Chloride: 103 mmol/L (ref 96–106)
Creatinine, Ser: 1.09 mg/dL — ABNORMAL HIGH (ref 0.57–1.00)
Glucose: 106 mg/dL — ABNORMAL HIGH (ref 70–99)
Potassium: 5.1 mmol/L (ref 3.5–5.2)
Sodium: 138 mmol/L (ref 134–144)
eGFR: 53 mL/min/{1.73_m2} — ABNORMAL LOW (ref >59)

## 2022-03-12 LAB — CBC
Hematocrit: 45 % (ref 34.0–46.6)
Hemoglobin: 15 g/dL (ref 11.1–15.9)
MCH: 29.1 pg (ref 26.6–33.0)
MCHC: 33.3 g/dL (ref 31.5–35.7)
MCV: 87 fL (ref 79–97)
Platelets: 223 10*3/uL (ref 150–450)
RBC: 5.15 x10E6/uL (ref 3.77–5.28)
RDW: 14.1 % (ref 11.7–15.4)
WBC: 6.1 10*3/uL (ref 3.4–10.8)

## 2022-03-12 MED ORDER — APIXABAN 2.5 MG PO TABS: 2.5000 mg | ORAL_TABLET | Freq: Two times a day (BID) | ORAL | 11 refills | Status: DC

## 2022-03-12 NOTE — Patient Instructions (Addendum)
Medication Instructions:  ?Your physician has recommended you make the following change in your medication:  ?START ELIQUIS 2.5 MG TWICE DAILY ON MAY 6TH   ?*If you need a refill on your cardiac medications before your next appointment, please call your pharmacy* ? ? ?Lab Work: ?TODAY: BMET, CBC ?If you have labs (blood work) drawn today and your tests are completely normal, you will receive your results only by: ?MyChart Message (if you have MyChart) OR ?A paper copy in the mail ?If you have any lab test that is abnormal or we need to change your treatment, we will call you to review the results. ? ? ?Testing/Procedures: ?SEE INSTRUCTION LETTER  ? ? ?Follow-Up: ?At [facility], you and your health needs are our priority.  As part of our continuing mission to provide you with exceptional heart care, we have created designated Provider Care Teams.  These Care Teams include your primary Cardiologist (physician) and Advanced Practice Providers (APPs -  Physician Assistants and Nurse Practitioners) who all work together to provide you with the care you need, when you need it. ? ?We recommend signing up for the patient portal called "MyChart".  Sign up information is provided on this After Visit Summary.  MyChart is used to connect with patients for Virtual Visits (Telemedicine).  Patients are able to view lab/test results, encounter notes, upcoming appointments, etc.  Non-urgent messages can be sent to your provider as well.   ?To learn more about what you can do with MyChart, go to [email or web address].   ? ?Your next appointment:   ?KEEP SCHEDULED FOLLOW-UP ? ?Important Information About Sugar ? ? ? ? ?  ?

## 2022-03-21 NOTE — Telephone Encounter (Signed)
Confirmed updated instructions with patient on the phone.  ?She will STOP ASPIRIN today and START ELIQUIS 2.5 mg BID tomorrow.  ?She understands to arrive for procedure at 0900 on 03/27/22. ?Other instructions reviewed. ?She will call prior to 5/11 if she has any questions. ?

## 2022-03-27 ENCOUNTER — Encounter (HOSPITAL_COMMUNITY): Payer: Self-pay | Admitting: Cardiovascular Disease

## 2022-03-27 ENCOUNTER — Inpatient Hospital Stay (HOSPITAL_COMMUNITY): Payer: Medicare Other | Admitting: Certified Registered Nurse Anesthetist

## 2022-03-27 ENCOUNTER — Inpatient Hospital Stay (HOSPITAL_COMMUNITY): Payer: Medicare Other

## 2022-03-27 ENCOUNTER — Inpatient Hospital Stay (HOSPITAL_COMMUNITY)
Admission: RE | Admit: 2022-03-27 | Discharge: 2022-03-28 | DRG: 274 | Disposition: A | Discharge status: Home / Self Care | Payer: Medicare Other | Attending: Cardiovascular Disease | Admitting: Cardiovascular Disease

## 2022-03-27 ENCOUNTER — Encounter (HOSPITAL_COMMUNITY)
Admission: RE | Disposition: A | Discharge status: Home / Self Care | Payer: Self-pay | Source: Home / Self Care | Attending: Cardiovascular Disease

## 2022-03-27 ENCOUNTER — Inpatient Hospital Stay (HOSPITAL_COMMUNITY)
Admission: RE | Admit: 2022-03-27 | Discharge: 2022-03-27 | Disposition: A | Discharge status: Home / Self Care | Payer: Medicare Other | Source: Ambulatory Visit | Attending: Cardiovascular Disease | Admitting: Cardiovascular Disease

## 2022-03-27 DIAGNOSIS — F418 Other specified anxiety disorders: Secondary | ICD-10-CM

## 2022-03-27 DIAGNOSIS — I4819 Other persistent atrial fibrillation: Secondary | ICD-10-CM

## 2022-03-27 DIAGNOSIS — E669 Obesity, unspecified: Secondary | ICD-10-CM | POA: Diagnosis present

## 2022-03-27 DIAGNOSIS — Z8249 Family history of ischemic heart disease and other diseases of the circulatory system: Secondary | ICD-10-CM

## 2022-03-27 DIAGNOSIS — Z880 Allergy status to penicillin: Secondary | ICD-10-CM

## 2022-03-27 DIAGNOSIS — I639 Cerebral infarction, unspecified: Secondary | ICD-10-CM | POA: Diagnosis present

## 2022-03-27 DIAGNOSIS — I480 Paroxysmal atrial fibrillation: Secondary | ICD-10-CM | POA: Diagnosis present

## 2022-03-27 DIAGNOSIS — F419 Anxiety disorder, unspecified: Secondary | ICD-10-CM | POA: Diagnosis present

## 2022-03-27 DIAGNOSIS — Z91048 Other nonmedicinal substance allergy status: Secondary | ICD-10-CM | POA: Diagnosis not present

## 2022-03-27 DIAGNOSIS — Z87891 Personal history of nicotine dependence: Secondary | ICD-10-CM | POA: Diagnosis not present

## 2022-03-27 DIAGNOSIS — Z006 Encounter for examination for normal comparison and control in clinical research program: Secondary | ICD-10-CM

## 2022-03-27 DIAGNOSIS — E785 Hyperlipidemia, unspecified: Secondary | ICD-10-CM | POA: Diagnosis present

## 2022-03-27 DIAGNOSIS — I10 Essential (primary) hypertension: Secondary | ICD-10-CM | POA: Diagnosis present

## 2022-03-27 DIAGNOSIS — I4891 Unspecified atrial fibrillation: Secondary | ICD-10-CM

## 2022-03-27 DIAGNOSIS — Z95818 Presence of other cardiac implants and grafts: Principal | ICD-10-CM

## 2022-03-27 DIAGNOSIS — I619 Nontraumatic intracerebral hemorrhage, unspecified: Secondary | ICD-10-CM | POA: Diagnosis present

## 2022-03-27 DIAGNOSIS — Z8673 Personal history of transient ischemic attack (TIA), and cerebral infarction without residual deficits: Secondary | ICD-10-CM | POA: Diagnosis not present

## 2022-03-27 DIAGNOSIS — Z820 Family history of epilepsy and other diseases of the nervous system: Secondary | ICD-10-CM | POA: Diagnosis not present

## 2022-03-27 DIAGNOSIS — I612 Nontraumatic intracerebral hemorrhage in hemisphere, unspecified: Secondary | ICD-10-CM

## 2022-03-27 DIAGNOSIS — Z823 Family history of stroke: Secondary | ICD-10-CM

## 2022-03-27 DIAGNOSIS — K219 Gastro-esophageal reflux disease without esophagitis: Secondary | ICD-10-CM | POA: Diagnosis present

## 2022-03-27 DIAGNOSIS — Z801 Family history of malignant neoplasm of trachea, bronchus and lung: Secondary | ICD-10-CM

## 2022-03-27 DIAGNOSIS — I081 Rheumatic disorders of both mitral and tricuspid valves: Secondary | ICD-10-CM | POA: Diagnosis not present

## 2022-03-27 DIAGNOSIS — Z90710 Acquired absence of both cervix and uterus: Secondary | ICD-10-CM

## 2022-03-27 DIAGNOSIS — Z7982 Long term (current) use of aspirin: Secondary | ICD-10-CM

## 2022-03-27 DIAGNOSIS — Z7901 Long term (current) use of anticoagulants: Secondary | ICD-10-CM | POA: Diagnosis not present

## 2022-03-27 DIAGNOSIS — Z885 Allergy status to narcotic agent status: Secondary | ICD-10-CM | POA: Diagnosis not present

## 2022-03-27 DIAGNOSIS — F32A Depression, unspecified: Secondary | ICD-10-CM | POA: Diagnosis not present

## 2022-03-27 DIAGNOSIS — Z01818 Encounter for other preprocedural examination: Secondary | ICD-10-CM | POA: Diagnosis not present

## 2022-03-27 HISTORY — DX: Presence of other cardiac implants and grafts: Z95.818

## 2022-03-27 HISTORY — DX: Depression, unspecified: F32.A

## 2022-03-27 HISTORY — PX: TEE WITHOUT CARDIOVERSION: SHX5443

## 2022-03-27 HISTORY — DX: Other specified postprocedural states: Z98.890

## 2022-03-27 HISTORY — PX: LEFT ATRIAL APPENDAGE OCCLUSION: EP1229

## 2022-03-27 HISTORY — DX: Cerebral infarction, unspecified: I63.9

## 2022-03-27 LAB — TYPE AND SCREEN
ABO/RH(D): O POS
Antibody Screen: NEGATIVE

## 2022-03-27 LAB — POCT ACTIVATED CLOTTING TIME
Activated Clotting Time: 347 seconds
Activated Clotting Time: 383 seconds
Activated Clotting Time: 281 seconds

## 2022-03-27 LAB — ABO/RH: ABO/RH(D): O POS

## 2022-03-27 LAB — SURGICAL PCR SCREEN
MRSA, PCR: NEGATIVE
Staphylococcus aureus: POSITIVE — AB

## 2022-03-27 SURGERY — LEFT ATRIAL APPENDAGE OCCLUSION
Anesthesia: General

## 2022-03-27 MED ORDER — FENTANYL CITRATE (PF) 100 MCG/2ML IJ SOLN
INTRAMUSCULAR | Status: DC | PRN
  Administered 2022-03-27: 100 ug via INTRAVENOUS

## 2022-03-27 MED ORDER — LACTATED RINGERS IV SOLN: INTRAVENOUS | Status: DC | PRN

## 2022-03-27 MED ORDER — SODIUM CHLORIDE 0.9% FLUSH
3.0000 mL | Freq: Two times a day (BID) | INTRAVENOUS | Status: DC
  Administered 2022-03-27: 3 mL via INTRAVENOUS

## 2022-03-27 MED ORDER — LACTATED RINGERS IV SOLN: INTRAVENOUS | Status: DC

## 2022-03-27 MED ORDER — HEPARIN (PORCINE) IN NACL 1000-0.9 UT/500ML-% IV SOLN
INTRAVENOUS | Status: DC | PRN
Start: 2022-03-27 — End: 2022-03-27
  Administered 2022-03-27: 500 mL

## 2022-03-27 MED ORDER — METOPROLOL TARTRATE 5 MG/5ML IV SOLN
INTRAVENOUS | Status: DC | PRN
  Administered 2022-03-27: 5 mg via INTRAVENOUS

## 2022-03-27 MED ORDER — VANCOMYCIN HCL IN DEXTROSE 1-5 GM/200ML-% IV SOLN
1000.0000 mg | INTRAVENOUS | Status: AC
  Administered 2022-03-27: 1000 mg via INTRAVENOUS

## 2022-03-27 MED ORDER — SODIUM CHLORIDE 0.9% FLUSH: 3.0000 mL | INTRAVENOUS | Status: DC | PRN

## 2022-03-27 MED ORDER — METOPROLOL TARTRATE 12.5 MG HALF TABLET
12.5000 mg | ORAL_TABLET | Freq: Every day | ORAL | Status: DC
  Administered 2022-03-27: 12.5 mg via ORAL
  Filled 2022-03-27: qty 1

## 2022-03-27 MED ORDER — ONDANSETRON HCL 4 MG/2ML IJ SOLN
INTRAMUSCULAR | Status: DC | PRN
  Administered 2022-03-27: 4 mg via INTRAVENOUS

## 2022-03-27 MED ORDER — PROTAMINE SULFATE 10 MG/ML IV SOLN
INTRAVENOUS | Status: DC | PRN
  Administered 2022-03-27 (×2): 10 mg via INTRAVENOUS

## 2022-03-27 MED ORDER — GABAPENTIN 100 MG PO CAPS
100.0000 mg | ORAL_CAPSULE | Freq: Every day | ORAL | Status: DC
  Administered 2022-03-27: 100 mg via ORAL
  Filled 2022-03-27: qty 1

## 2022-03-27 MED ORDER — VANCOMYCIN HCL IN DEXTROSE 1-5 GM/200ML-% IV SOLN
INTRAVENOUS | Status: AC
  Filled 2022-03-27: qty 200

## 2022-03-27 MED ORDER — HEPARIN (PORCINE) IN NACL 2000-0.9 UNIT/L-% IV SOLN
INTRAVENOUS | Status: DC | PRN
  Administered 2022-03-27: 1000 mL

## 2022-03-27 MED ORDER — LACTATED RINGERS IV SOLN
INTRAVENOUS | Status: DC | PRN
Start: 2022-03-27 — End: 2022-03-27

## 2022-03-27 MED ORDER — SODIUM CHLORIDE 0.9 % IV SOLN: INTRAVENOUS | Status: DC

## 2022-03-27 MED ORDER — HEPARIN SODIUM (PORCINE) 1000 UNIT/ML IJ SOLN
INTRAMUSCULAR | Status: DC | PRN
  Administered 2022-03-27: 14000 [IU] via INTRAVENOUS

## 2022-03-27 MED ORDER — PHENYLEPHRINE HCL-NACL 20-0.9 MG/250ML-% IV SOLN
INTRAVENOUS | Status: DC | PRN
  Administered 2022-03-27: 25 ug/min via INTRAVENOUS

## 2022-03-27 MED ORDER — ACETAMINOPHEN 325 MG PO TABS
650.0000 mg | ORAL_TABLET | ORAL | Status: DC | PRN
  Administered 2022-03-27: 650 mg via ORAL
  Filled 2022-03-27: qty 2

## 2022-03-27 MED ORDER — HEPARIN (PORCINE) IN NACL 1000-0.9 UT/500ML-% IV SOLN
INTRAVENOUS | Status: AC
Start: 2022-03-27 — End: ?
  Filled 2022-03-27: qty 500

## 2022-03-27 MED ORDER — SERTRALINE HCL 50 MG PO TABS
50.0000 mg | ORAL_TABLET | Freq: Every day | ORAL | Status: DC
  Administered 2022-03-27: 50 mg via ORAL
  Filled 2022-03-27 (×2): qty 1

## 2022-03-27 MED ORDER — SUGAMMADEX SODIUM 200 MG/2ML IV SOLN
INTRAVENOUS | Status: DC | PRN
  Administered 2022-03-27: 200 mg via INTRAVENOUS

## 2022-03-27 MED ORDER — ONDANSETRON HCL 4 MG/2ML IJ SOLN: 4.0000 mg | Freq: Four times a day (QID) | INTRAMUSCULAR | Status: DC | PRN

## 2022-03-27 MED ORDER — BENAZEPRIL HCL 5 MG PO TABS
10.0000 mg | ORAL_TABLET | Freq: Two times a day (BID) | ORAL | Status: DC
  Administered 2022-03-27 – 2022-03-28 (×2): 10 mg via ORAL
  Filled 2022-03-27 (×3): qty 2

## 2022-03-27 MED ORDER — PANTOPRAZOLE SODIUM 40 MG PO TBEC
40.0000 mg | DELAYED_RELEASE_TABLET | Freq: Every day | ORAL | Status: DC
  Administered 2022-03-27 – 2022-03-28 (×2): 40 mg via ORAL
  Filled 2022-03-27 (×2): qty 1

## 2022-03-27 MED ORDER — IOHEXOL 350 MG/ML SOLN
INTRAVENOUS | Status: DC | PRN
  Administered 2022-03-27: 65 mL

## 2022-03-27 MED ORDER — ROCURONIUM BROMIDE 10 MG/ML (PF) SYRINGE
PREFILLED_SYRINGE | INTRAVENOUS | Status: DC | PRN
  Administered 2022-03-27 (×2): 10 mg via INTRAVENOUS
  Administered 2022-03-27: 60 mg via INTRAVENOUS
  Administered 2022-03-27: 10 mg via INTRAVENOUS

## 2022-03-27 MED ORDER — METOPROLOL TARTRATE 5 MG/5ML IV SOLN
INTRAVENOUS | Status: AC
  Filled 2022-03-27: qty 5

## 2022-03-27 MED ORDER — APIXABAN 2.5 MG PO TABS
2.5000 mg | ORAL_TABLET | Freq: Two times a day (BID) | ORAL | Status: DC
  Administered 2022-03-27 – 2022-03-28 (×2): 2.5 mg via ORAL
  Filled 2022-03-27 (×2): qty 1

## 2022-03-27 MED ORDER — PROPOFOL 10 MG/ML IV BOLUS
INTRAVENOUS | Status: DC | PRN
  Administered 2022-03-27: 100 mg via INTRAVENOUS
  Administered 2022-03-27 (×2): 50 mg via INTRAVENOUS

## 2022-03-27 MED ORDER — DEXAMETHASONE SODIUM PHOSPHATE 10 MG/ML IJ SOLN
INTRAMUSCULAR | Status: DC | PRN
  Administered 2022-03-27: 5 mg via INTRAVENOUS

## 2022-03-27 MED ORDER — HEPARIN (PORCINE) IN NACL 2000-0.9 UNIT/L-% IV SOLN
INTRAVENOUS | Status: AC
Start: 2022-03-27 — End: ?
  Filled 2022-03-27: qty 1000

## 2022-03-27 MED ORDER — LIDOCAINE 2% (20 MG/ML) 5 ML SYRINGE
INTRAMUSCULAR | Status: DC | PRN
  Administered 2022-03-27: 60 mg via INTRAVENOUS

## 2022-03-27 MED ORDER — SODIUM CHLORIDE 0.9 % IV SOLN: 250.0000 mL | INTRAVENOUS | Status: DC | PRN

## 2022-03-27 SURGICAL SUPPLY — 25 items
BAG SNAP BAND KOVER 36X36 (MISCELLANEOUS) ×1 IMPLANT
BLANKET WARM UNDERBOD FULL ACC (MISCELLANEOUS) ×2 IMPLANT
CATH DIAG 6FR PIGTAIL ANGLED (CATHETERS) ×1 IMPLANT
CLOSURE PERCLOSE PROSTYLE (VASCULAR PRODUCTS) ×2 IMPLANT
DEVICE WATCHMAN FLX PROC (KITS) IMPLANT
DILATOR VESSEL 38 20CM 11FR (INTRODUCER) ×1 IMPLANT
DRYSEAL FLEXSHEATH 16FR 33CM (SHEATH) ×1
GUIDEWIRE INQWIRE 1.5J.035X260 (WIRE) ×1 IMPLANT
INQWIRE 1.5J .035X260CM (WIRE) ×2
KIT HEART LEFT (KITS) ×2 IMPLANT
KIT SHEA VERSACROSS LAAC CONNE (KITS) ×1 IMPLANT
PACK CARDIAC CATHETERIZATION (CUSTOM PROCEDURE TRAY) ×2 IMPLANT
PAD DEFIB LIFELINK (PAD) ×1 IMPLANT
PAD DEFIB RADIO PHYSIO CONN (PAD) ×2 IMPLANT
SHEATH DRYSEAL FLEX 16FR 33CM (SHEATH) IMPLANT
SHEATH PINNACLE 8F 10CM (SHEATH) ×1 IMPLANT
SHEATH PROBE COVER 6X72 (BAG) ×3 IMPLANT
SHIELD RADPAD SCOOP 12X17 (MISCELLANEOUS) ×3 IMPLANT
SYS WATCHMAN FXD DBL (SHEATH) ×2
SYSTEM WATCHMAN FXD DBL (SHEATH) IMPLANT
TRANSDUCER W/STOPCOCK (MISCELLANEOUS) ×2 IMPLANT
TUBING CIL FLEX 10 FLL-RA (TUBING) ×2 IMPLANT
WATCHMAN FLX 27 (Prosthesis & Implant Heart) ×1 IMPLANT
WATCHMAN FLX PROCEDURE DEVICE (KITS) ×2 IMPLANT
WATCHMAN PROCED TRUSEAL ACCESS (SHEATH) ×1 IMPLANT

## 2022-03-27 NOTE — Interval H&P Note (Signed)
History and Physical Interval Note: ? ?03/27/2022 ?12:28 PM ? ?[name]  has presented today for surgery, with the diagnosis of atrial fibrillation.  The various methods of treatment have been discussed with the patient and family. After consideration of risks, benefits and other options for treatment, the patient has consented to  Procedure(s): ?LEFT ATRIAL APPENDAGE OCCLUSION (N/A) ?TRANSESOPHAGEAL ECHOCARDIOGRAM (TEE) (N/A) as a surgical intervention.  The patient's history has been reviewed, patient examined, no change in status, stable for surgery.  I have reviewed the patient's chart and labs.  Questions were answered to the patient's satisfaction.   ? ? ?[name] ? ? ?

## 2022-03-27 NOTE — Anesthesia Procedure Notes (Signed)
Procedure Name: Intubation ?Date/Time: 03/27/2022 1:18 PM ?Performed by: Harden Mo, CRNA ?Pre-anesthesia Checklist: Patient identified, Emergency Drugs available, Suction available and Patient being monitored ?Patient Re-evaluated:Patient Re-evaluated prior to induction ?Oxygen Delivery Method: Circle System Utilized ?Preoxygenation: Pre-oxygenation with 100% oxygen ?Induction Type: IV induction ?Ventilation: Mask ventilation with difficulty, Oral airway inserted - appropriate to patient size and Two handed mask ventilation required ?Laryngoscope Size: Glidescope and 3 ?Grade View: Grade I ?Tube type: Oral ?Tube size: 7.0 mm ?Number of attempts: 1 ?Airway Equipment and Method: Stylet and Oral airway ?Placement Confirmation: ETT inserted through vocal cords under direct vision, positive ETCO2 and breath sounds checked- equal and bilateral ?Secured at: 22 cm ?Tube secured with: Tape ?Dental Injury: Teeth and Oropharynx as per pre-operative assessment  ?Difficulty Due To: Difficulty was anticipated, Difficult Airway- due to anterior larynx, Difficult Airway- due to limited oral opening and Difficult Airway- due to reduced neck mobility ? ? ? ? ?

## 2022-03-27 NOTE — Transfer of Care (Signed)
Immediate Anesthesia Transfer of Care Note ? ?Patient: [name] ? ?Procedure(s) Performed: LEFT ATRIAL APPENDAGE OCCLUSION ?TRANSESOPHAGEAL ECHOCARDIOGRAM (TEE) ? ?Patient Location: Cath Lab ? ?Anesthesia Type:General ? ?Level of Consciousness: awake and drowsy ? ?Airway & Oxygen Therapy: Patient Spontanous Breathing and Patient connected to nasal cannula oxygen ? ?Post-op Assessment: Report given to RN, Post -op Vital signs reviewed and stable and Patient moving all extremities X 4 ? ?Post vital signs: Reviewed and stable ? ?Last Vitals:  ?Vitals Value Taken Time  ?BP 124/99 03/27/22 1540  ?Temp    ?Pulse 75 03/27/22 1542  ?Resp 18 03/27/22 1542  ?SpO2 96 % 03/27/22 1542  ?Vitals shown include unvalidated device data. ? ?Last Pain:  ?Vitals:  ? 03/27/22 1008  ?TempSrc:   ?PainSc: 0-No pain  ?   ? ?  ? ?Complications: There were no known notable events for this encounter. ?

## 2022-03-27 NOTE — Progress Notes (Signed)
?  Echocardiogram ?Echocardiogram Transesophageal has been performed. ? Hassie Bruce ?03/27/2022, 3:39 PM ?

## 2022-03-27 NOTE — Anesthesia Preprocedure Evaluation (Signed)
Anesthesia Evaluation  ?Patient identified by MRN, date of birth, ID band ?Patient awake ? ? ? ?Reviewed: ?Allergy & Precautions, H&P , NPO status , Patient's Chart, lab work & pertinent test results ? ?History of Anesthesia Complications ?(+) PONV and history of anesthetic complications ? ?Airway ?Mallampati: II ? ? ?Neck ROM: full ? ? ? Dental ?  ?Pulmonary ?former smoker,  ?  ?breath sounds clear to auscultation ? ? ? ? ? ? Cardiovascular ?hypertension,  ?Rhythm:regular Rate:Normal ? ? ?  ?Neuro/Psych ? Headaches, PSYCHIATRIC DISORDERS Anxiety Depression CVA   ? GI/Hepatic ?GERD  ,  ?Endo/Other  ? ? Renal/GU ?  ? ?  ?Musculoskeletal ? ? Abdominal ?  ?Peds ? Hematology ?  ?Anesthesia Other Findings ? ? Reproductive/Obstetrics ? ?  ? ? ? ? ? ? ? ? ? ? ? ? ? ?  ?  ? ? ? ? ? ? ? ? ?Anesthesia Physical ?Anesthesia Plan ? ?ASA: 3 ? ?Anesthesia Plan: General  ? ?Post-op Pain Management:   ? ?Induction: Intravenous ? ?PONV Risk Score and Plan: 4 or greater and Ondansetron, Dexamethasone and Treatment may vary due to age or medical condition ? ?Airway Management Planned: Oral ETT ? ?Additional Equipment:  ? ?Intra-op Plan:  ? ?Post-operative Plan: Extubation in OR ? ?Informed Consent: I have reviewed the patients History and Physical, chart, labs and discussed the procedure including the risks, benefits and alternatives for the proposed anesthesia with the patient or authorized representative who has indicated his/her understanding and acceptance.  ? ? ? ?Dental advisory given ? ?Plan Discussed with: CRNA, Anesthesiologist and Surgeon ? ?Anesthesia Plan Comments:   ? ? ? ? ? ? ?Anesthesia Quick Evaluation ? ?

## 2022-03-28 ENCOUNTER — Other Ambulatory Visit: Payer: Self-pay

## 2022-03-28 ENCOUNTER — Encounter (HOSPITAL_COMMUNITY): Payer: Self-pay | Admitting: Cardiovascular Disease

## 2022-03-28 DIAGNOSIS — I10 Essential (primary) hypertension: Secondary | ICD-10-CM | POA: Diagnosis not present

## 2022-03-28 DIAGNOSIS — Z95818 Presence of other cardiac implants and grafts: Secondary | ICD-10-CM

## 2022-03-28 DIAGNOSIS — E669 Obesity, unspecified: Secondary | ICD-10-CM | POA: Diagnosis not present

## 2022-03-28 DIAGNOSIS — I4819 Other persistent atrial fibrillation: Secondary | ICD-10-CM | POA: Diagnosis not present

## 2022-03-28 DIAGNOSIS — Z006 Encounter for examination for normal comparison and control in clinical research program: Secondary | ICD-10-CM | POA: Diagnosis not present

## 2022-03-28 LAB — BASIC METABOLIC PANEL
Anion gap: 8 (ref 5–15)
BUN: 17 mg/dL (ref 8–23)
CO2: 20 mmol/L — ABNORMAL LOW (ref 22–32)
Calcium: 9.2 mg/dL (ref 8.9–10.3)
Chloride: 108 mmol/L (ref 98–111)
Creatinine, Ser: 1.14 mg/dL — ABNORMAL HIGH (ref 0.44–1.00)
GFR, Estimated: 51 mL/min — ABNORMAL LOW (ref >60)
Glucose, Bld: 170 mg/dL — ABNORMAL HIGH (ref 70–99)
Potassium: 4.2 mmol/L (ref 3.5–5.1)
Sodium: 136 mmol/L (ref 135–145)

## 2022-03-28 MED ORDER — MUPIROCIN 2 % EX OINT
1.0000 "application " | TOPICAL_OINTMENT | Freq: Two times a day (BID) | CUTANEOUS | Status: DC
  Administered 2022-03-28: 1 via NASAL
  Filled 2022-03-28: qty 22

## 2022-03-28 MED ORDER — CHLORHEXIDINE GLUCONATE CLOTH 2 % EX PADS: 6.0000 | MEDICATED_PAD | Freq: Every day | CUTANEOUS | Status: DC

## 2022-03-28 NOTE — TOC Transition Note (Signed)
Transition of Care (TOC) - CM/SW Discharge Note ? ? ?Patient Details  ?Name: [name] ?MRN: [id number] ?Date of Birth: 01/25/1946 ? ?Transition of Care (TOC) CM/SW Contact:  ?Zenon Mayo, RN ?Phone Number: ?03/28/2022, 11:29 AM ? ? ?Clinical Narrative:    ?Patient is for dc today, her daughter n law is in the room to transport her home. She has no needs. ? ? ?  ?  ? ? ?Patient Goals and CMS Choice ?  ?  ?  ? ?Discharge Placement ?  ?           ?  ?  ?  ?  ? ?Discharge Plan and Services ?  ?  ?           ?  ?  ?  ?  ?  ?  ?  ?  ?  ?  ? ?Social Determinants of Health (SDOH) Interventions ?  ? ? ?Readmission Risk Interventions ?   ? View : No data to display.  ?  ?  ?  ? ? ? ? ? ?

## 2022-03-28 NOTE — Anesthesia Postprocedure Evaluation (Signed)
Anesthesia Post Note ? ?Patient: [name] ? ?Procedure(s) Performed: LEFT ATRIAL APPENDAGE OCCLUSION ?TRANSESOPHAGEAL ECHOCARDIOGRAM (TEE) ? ?  ? ?Anesthesia Type: General ?Level of consciousness: awake and alert ?Pain management: pain level controlled ?Vital Signs Assessment: post-procedure vital signs reviewed and stable ?Respiratory status: spontaneous breathing, nonlabored ventilation and respiratory function stable ?Cardiovascular status: blood pressure returned to baseline and stable ?Postop Assessment: no apparent nausea or vomiting ?Anesthetic complications: no ? ? ?There were no known notable events for this encounter. ? ?Last Vitals:  ?Vitals:  ? 03/27/22 2333 03/28/22 0400  ?BP: 113/60 128/70  ?Pulse: 92 84  ?Resp: 18 15  ?Temp: 36.6 ?C 36.6 ?C  ?SpO2: 95% 92%  ?  ?Last Pain:  ?Vitals:  ? 03/28/22 0400  ?TempSrc: Oral  ?PainSc: 0-No pain  ? ? ?  ?  ?  ?  ?  ?  ? ?Lidia Collum ? ? ? ? ?

## 2022-03-28 NOTE — Discharge Summary (Addendum)
?  ? ?[facility]   ? ?Patient ID: [name],  ?MRN: [id number], DOB/AGE: 05-23-1946 76 y.o. ? ?Admit date: 03/27/2022 ?Discharge date: 03/28/2022 ? ?Primary Care Physician: Garvin Fila, MD  ?Primary Cardiologist: None  ?Electrophysiologist: None ? ?Primary Discharge Diagnosis:  ?Persistent Atrial Fibrillation ?Poor candidacy for long term anticoagulation due to h/o intracranial hemorrhage ? ?Secondary Discharge Diagnosis:  ?Left occipital-parietal anterior cranial hemorrhage in 2019 ?HLD ?GERD ?HTN ?Obesity. ? ?Procedures This Admission:  ? ?Successful LAA Occlusion with a 27 mm Watchman FLX device using fluoroscopic and TEE guidance ?  ?Recommend: apixaban 2.5 mg BID, 6-8 week CTA, clopidogrel monotherapy if criteria met by CTA ? ?Brief HPI: ?[name] is a 76 y.o. female with a history of persistent atrial fibrillation not on AC due to hx of left occipital-parietal anterior cranial hemorrhage in 2019, HLD, GERD, HTN, and obesity.  ?  ?[name] was referred to [name] for possible Watchman implant from [name] for long term stroke prevention. She has persistent atrial fibrillation and is felt to be a poor candidate for long term [place] due to history of intracerebral hemorrhage. She was previously in PAF however more recently was evaluated by [name] and found to be in atrial fibrillation. At that time, she reported occasional fluttering in her chest along with shortness of breath with physical activity. After discussion, the patient was willing to proceed with further workup. CT performed 01/24/22 which showed anatomy suitable for Watchman implant.  ? ?Hospital Course:  ?The patient was admitted and underwent left atrial appendage occlusive device placement with a Watchman FLX 63m device.  She was monitored on telemetry overnight which demonstrated persistent atrial fibrillation with stable rates. Groin site has remained stable without evidence of was without  complication on the day of discharge. The patient was examined and considered to be stable for discharge.  Wound care and restrictions were reviewed with the patient. The patient has been scheduled for post procedure follow up with JKathyrn Drown NP in 1 month. Medication plan will be to continue Eliquis 2.546mBID for 6-8 weeks then transition to Plavix monotherapy if follow up CTA with adequate seal at the 45-day mark. Will need to transition omeprazole to Protonix while taking Plavix. Needs SBE for 6 months duration post implant. She has a crown implant scheduled for next week. I have asked that she postpone this for at least one month. Will RX Azithromycin at follow up 5/24.  ? ?Physical Exam: ?Vitals:  ? 03/27/22 2116 03/27/22 2333 03/28/22 0400 03/28/22 0749  ?BP: 138/68 113/60 128/70 129/78  ?Pulse: 95 92 84 86  ?Resp:  '18 15 19  ' ?Temp:  97.8 ?F (36.6 ?C) 97.8 ?F (36.6 ?C) 98.1 ?F (36.7 ?C)  ?TempSrc:  Oral Oral Oral  ?SpO2:  95% 92% 96%  ?Weight:      ?Height:      ? ?General: Well developed, well nourished, NAD ?Lungs:Clear to ausculation bilaterally. No wheezes, rales, or rhonchi. Breathing is unlabored. ?Cardiovascular: RRR with S1 S2. No murmurs ?Extremities: No edema.  ?Neuro: Alert and oriented. No focal deficits. No facial asymmetry. MAE spontaneously. ?Psych: Responds to questions appropriately with normal affect.   ? ?Labs: ?  ?Lab Results  ?Component Value Date  ? WBC 6.1 03/12/2022  ? HGB 15.0 03/12/2022  ? HCT 45.0 03/12/2022  ? MCV 87 03/12/2022  ? PLT 223 03/12/2022  ?  ?Recent Labs  ?Lab 03/28/22 ?0114  ?NA 136  ?K 4.2  ?  CL 108  ?CO2 20*  ?BUN 17  ?CREATININE 1.14*  ?CALCIUM 9.2  ?GLUCOSE 170*  ? ?Discharge Medications:  ?Allergies as of 03/28/2022   ? ?   Reactions  ? Penicillins   ? Passed out ?Has patient had a PCN reaction causing immediate rash, facial/tongue/throat swelling, SOB or lightheadedness with hypotension: Yes ?Has patient had a PCN reaction causing severe rash involving mucus  membranes or skin necrosis: No ?Has patient had a PCN reaction that required hospitalization: No ?Has patient had a PCN reaction occurring within the last 10 years: No ?If all of the above answers are "NO", then may proceed with Cephalosporin use.  ? Codeine Itching  ? Tape Rash  ? Medical tape did this  ? ?  ? ?  ?Medication List  ?  ? ?TAKE these medications   ? ?acetaminophen 500 MG tablet ?Commonly known as: TYLENOL ?Take 500-1,000 mg by mouth every 6 (six) hours as needed for moderate pain. ?  ?apixaban 2.5 MG Tabs tablet ?Commonly known as: ELIQUIS ?Take 1 tablet (2.5 mg total) by mouth 2 (two) times daily. ?  ?benazepril 10 MG tablet ?Commonly known as: LOTENSIN ?Take 1 tablet (10 mg total) by mouth 2 (two) times daily. ?  ?gabapentin 100 MG capsule ?Commonly known as: NEURONTIN ?Take 1 capsule (100 mg total) by mouth at bedtime. ?  ?LUBRICATING EYE DROPS OP ?Place 1 drop into both eyes daily as needed (dry eyes). ?  ?metoprolol tartrate 25 MG tablet ?Commonly known as: LOPRESSOR ?TAKE 0.5 TABLETS (12.5 MG TOTAL) BY MOUTH DAILY AT 10 PM. ?  ?nitroGLYCERIN 0.4 MG SL tablet ?Commonly known as: NITROSTAT ?Place 1 tablet (0.4 mg total) under the tongue every 5 (five) minutes as needed for chest pain (x 3 doses). ?  ?omeprazole 20 MG capsule ?Commonly known as: PRILOSEC ?Take 20 mg by mouth every morning. ?  ?sertraline 100 MG tablet ?Commonly known as: ZOLOFT ?Take 50 mg by mouth at bedtime. ?  ? ?  ? ? ?Disposition:  Home  ?Discharge Instructions   ? ? (HEART FAILURE PATIENTS) Call MD:  Anytime you have any of the following symptoms: 1) 3 pound weight gain in 24 hours or 5 pounds in 1 week 2) shortness of breath, with or without a dry hacking cough 3) swelling in the hands, feet or stomach 4) if you have to sleep on extra pillows at night in order to breathe.   Complete by: As directed ?  ? Call MD for:  difficulty breathing, headache or visual disturbances   Complete by: As directed ?  ? Call MD for:  extreme  fatigue   Complete by: As directed ?  ? Call MD for:  hives   Complete by: As directed ?  ? Call MD for:  persistant dizziness or light-headedness   Complete by: As directed ?  ? Call MD for:  persistant nausea and vomiting   Complete by: As directed ?  ? Call MD for:  redness, tenderness, or signs of infection (pain, swelling, redness, odor or green/yellow discharge around incision site)   Complete by: As directed ?  ? Call MD for:  severe uncontrolled pain   Complete by: As directed ?  ? Call MD for:  temperature >100.4   Complete by: As directed ?  ? Diet - low sodium heart healthy   Complete by: As directed ?  ? Discharge instructions   Complete by: As directed ?  ? WATCHMAN? Procedure, Care After ? ?  Procedure MD: [name] ?Watchman Clinical Coordinator: Lenice Llamas, RN ? ?This sheet gives you information about how to care for yourself after your procedure. Your health care provider may also give you more specific instructions. If you have problems or questions, contact your health care provider. ? ?What can I expect after the procedure? ?After the procedure, it is common to have: ?Bruising around your puncture site. ?Tenderness around your puncture site. ?Tiredness (fatigue). ? ?Medication instructions ?It is very important to continue to take your blood thinner as directed by your doctor after the Watchman procedure. Call your procedure doctor's office with question or concerns. ?If you are on Coumadin (warfarin), you will have your INR checked the week after your procedure, with a goal INR of 2.0 - 3.0. ?Please follow your medication instructions on your discharge summary. Only take the medications listed on your discharge paperwork. ? ?Follow up ?You will be seen in 1 month after your procedure in the Atrial Fibrillation Clinic ?You will have another CT approximately 6 weeks after your procedure mark to check your device ?You will follow up the MD/APP who performed your procedure 6 months after your  procedure ?The Watchman Clinical Coordinator will check in with you from time to time, including 1 and 2 years after your procedure.  ? ? ?Follow these instructions at home: ?Puncture site care  ?Follow instructions from yo

## 2022-03-31 ENCOUNTER — Telehealth: Payer: Self-pay

## 2022-03-31 NOTE — Telephone Encounter (Signed)
?  [facility]   ?Watchman Team ? ?Contacted the patient regarding discharge from [facility] on 03/28/2022 ? ?The patient understands to follow up with Kathyrn Drown on 04/09/2022 in preparation for CT on 05/12/2022. ? ?The patient understands discharge instructions? Yes ? ?The patient understands medications and regimen? Yes  ? ?The patient reports groin sites look healthy with no signs/symptoms of infection or bleeding. ? ?The patient understands to call with any questions or concerns prior to scheduled visit.  ? ?

## 2022-04-07 ENCOUNTER — Ambulatory Visit: Payer: Medicare Other | Admitting: Cardiology

## 2022-04-08 NOTE — Progress Notes (Signed)
HEART AND [name]                                     Cardiology Office Note:    Date:  04/09/2022   ID:  [name], DOB 1946-07-18, MRN [id number]  PCP:  Katherine Fila, MD  [facility] Cardiologist:  None  [facility] Electrophysiologist:  None   Referring MD: Katherine Fila, MD   Chief Complaint  Patient presents with   Follow-up    Post watchman f/u   History of Present Illness:    [name] is a 76 y.o. female with a hx of persistent atrial fibrillation not on AC due to hx of left occipital-parietal anterior cranial hemorrhage in 2019, HLD, GERD, HTN, and obesity.    [name] was referred to Dr. Burt Guerra for possible Watchman implant from [name] for long term stroke prevention. She has persistent atrial fibrillation and is felt to be a poor candidate for long term [place] due to history of intracerebral hemorrhage. She was previously in PAF however more recently was evaluated by [name] and found to be in atrial fibrillation. At that time, she reported occasional fluttering in her chest along with shortness of breath with physical activity. After discussion, the patient was willing to proceed with further workup. CT performed 01/24/22 which showed anatomy suitable for  implant.   She underwent implant with a Watchman FLX 2m device on 03/27/22. She was continued on Eliquis 2.59mBID for 6-8 weeks then transition to Plavix monotherapy if follow up CTA with adequate seal at the 45-day mark. Will need to transition omeprazole to Protonix while taking Plavix. Needs SBE for 6 months duration post implant (until 09/2022).  She presents today for follow up with her daughter. She has been doing well from a CV standpoint. We discussed the plan as outline above with all questions answered. She denies chest pain, LE edema, orthopnea, however is having some very mild palpitations. HR today in the 90's. She has been  taking 12.56m60metoprolol once daily. We discussed increasing this to twice daily and will likely need to increase the dosing as well on follow up.    Past Medical History:  Diagnosis Date   Depression    Dyslipidemia    GERD (gastroesophageal reflux disease)    High risk medication use    on Flecainide since January of 2013   Hypertension    Obesity    PAF (paroxysmal atrial fibrillation) (HCC)    normal stress echo in 2011   PONV (postoperative nausea and vomiting)    Presence of Watchman left atrial appendage closure device 03/27/2022   Watchman FL[facility] 22[facility]56[facility]  Past Surgical History:  Procedure Laterality Date   +     ABDOMINAL HYSTERECTOMY     ANKLE SURGERY     APPENDECTOMY     CARDIOVASCULAR STRESS TEST  07/21/2007   EF 80%, NORMAL NONDIAGONISTIC FOR ISCHEMIA   CHOLECYSTECTOMY     EYE SURGERY     LEFT ATRIAL APPENDAGE OCCLUSION N/A 03/27/2022   Procedure: LEFT ATRIAL APPENDAGE OCCLUSION;  Surgeon: [name];  Location: [facility];  Service: Cardiovascular;  Laterality: N/A;   STRESS ECHO TEST  07/25/2010   EF 60%   TEE WITHOUT CARDIOVERSION N/A 03/27/2022   Procedure:  TRANSESOPHAGEAL ECHOCARDIOGRAM (TEE);  Surgeon: Cooper, Michael, MD;  Location: [facility];  Service: Cardiovascular;  Laterality: N/A;   WRIST SURGERY     RIGHT WRIST    Current Medications: Current Meds  Medication Sig   acetaminophen (TYLENOL) 500 MG tablet Take 500-1,000 mg by mouth every 6 (six) hours as needed for moderate pain.   apixaban (ELIQUIS) 2.5 MG TABS tablet Take 1 tablet (2.5 mg total) by mouth 2 (two) times daily.   benazepril (LOTENSIN) 10 MG tablet Take 1 tablet (10 mg total) by mouth 2 (two) times daily.   Carboxymethylcellul-Glycerin (LUBRICATING EYE DROPS OP) Place 1 drop into both eyes daily as needed (dry eyes).   gabapentin (NEURONTIN) 100 MG capsule Take 1 capsule (100 mg total) by mouth at bedtime.   metoprolol  tartrate (LOPRESSOR) 25 MG tablet Take 0.5 tablets (12.5 mg total) by mouth 2 (two) times daily.   nitroGLYCERIN (NITROSTAT) 0.4 MG SL tablet Place 1 tablet (0.4 mg total) under the tongue every 5 (five) minutes as needed for chest pain (x 3 doses).   omeprazole (PRILOSEC) 20 MG capsule Take 20 mg by mouth every morning.   sertraline (ZOLOFT) 100 MG tablet Take 50 mg by mouth at bedtime.   [DISCONTINUED] metoprolol tartrate (LOPRESSOR) 25 MG tablet TAKE 0.5 TABLETS (12.5 MG TOTAL) BY MOUTH DAILY AT 10 PM.     Allergies:   Penicillins, Codeine, and Tape   Social History   Socioeconomic History   Marital status: Widowed    Spouse name: Not on file   Number of children: Not on file   Years of education: Not on file   Highest education level: Not on file  Occupational History   Not on file  Tobacco Use   Smoking status: Former   Smokeless tobacco: Never  Substance and Sexual Activity   Alcohol use: No   Drug use: No   Sexual activity: Yes  Other Topics Concern   Not on file  Social History Narrative   Not on file   Social Determinants of Health   Financial Resource Strain: Not on file  Food Insecurity: Not on file  Transportation Needs: Not on file  Physical Activity: Not on file  Stress: Not on file  Social Connections: Not on file    Family History: The patient's family history includes Alzheimer's disease in her mother; Cancer in her brother; Heart disease in her father; Lung cancer in her brother; Stroke in her sister.  ROS:   Please see the history of present illness.    All other systems reviewed and are negative.  EKGs/Labs/Other Studies Reviewed:    The following studies were reviewed today:  LAAO 03/27/22:  Successful LAA Occlusion with a 27 mm Watchman FLX device using fluoroscopic and TEE guidance   Recommend: apixaban 2.5 mg BID, 6-8 week CTA, clopidogrel monotherapy if criteria met by CTA   TEE 03/27/22:   1. TEE guided LAAC. A 27 mm Watchman FLX  device was placed. Max diameter  21.6 mm (20% compression). A small iatrogenic ASD was present after the  procedure with L to R shunting. A small pericardial effusion was present  before the case and did not expand.   No immediate complications observed.   2. Left ventricular ejection fraction, by estimation, is 60 to 65%. The  left ventricle has normal function. The left ventricle has no regional  wall motion abnormalities.   3. Right ventricular systolic function is normal. The right ventricular  size   is normal.   4. Left atrial size was mildly dilated. No left atrial/left atrial  appendage thrombus was detected.   5. A small pericardial effusion is present. The pericardial effusion is  circumferential.   6. The mitral valve is grossly normal. Mild mitral valve regurgitation.  No evidence of mitral stenosis.   7. The aortic valve is tricuspid. Aortic valve regurgitation is not  visualized. No aortic stenosis is present.   8. There is mild (Grade II) layered plaque involving the descending  aorta.   EKG:  EKG is not ordered today.   Recent Labs: 03/12/2022: Hemoglobin 15.0; Platelets 223 03/28/2022: BUN 17; Creatinine, Ser 1.14; Potassium 4.2; Sodium 136   Recent Lipid Panel    Component Value Date/Time   CHOL 97 07/14/2018 0353   TRIG 110 07/14/2018 0353   HDL 26 (L) 07/14/2018 0353   CHOLHDL 3.7 07/14/2018 0353   VLDL 22 07/14/2018 0353   LDLCALC 49 07/14/2018 0353   Physical Exam:    VS:  BP 120/64   Pulse 92   Ht 5' 6" (1.676 m)   Wt 218 lb (98.9 kg)   SpO2 98%   BMI 35.19 kg/m     Wt Readings from Last 3 Encounters:  04/09/22 218 lb (98.9 kg)  03/27/22 217 lb (98.4 kg)  03/12/22 219 lb (99.3 kg)    General: Well developed, well nourished, NAD Neck: Negative for carotid bruits. No JVD Lungs:Clear to ausculation bilaterally. Breathing is unlabored. Cardiovascular: RRR with S1 S2. No murmurs Extremities: No edema. Neuro: Alert and oriented. No focal deficits.  No facial asymmetry. MAE spontaneously. Psych: Responds to questions appropriately with normal affect.    ASSESSMENT/PLAN:    Persistent atrial fibrillation: Patient had a left occipital-parietal anterior cranial hemorrhage in 2019. She was previously diagnosed with paroxysmal AF and was not on anticoagulation due to the above. More recently she was found to be in persistent AF and was referred to Dr. Burt Guerra for possible Watchman implant. CT showed anatomy suitable for implant. She underwent implant 03/27/22 with medication plan for Eliquis 2.21m BID for 6-8 weeks then transition to Plavix monotherapy if follow up CTA with adequate seal at the 45-day mark. Will need to transition omeprazole to Protonix while taking Plavix. Needs SBE for 6 months duration post implant (until 09/2022).This was RX'ed today.   Tachycardia: HRs have been elevated in the 90-low 100's. She has been taking Metoprolol 12.514mQD. Will increase to BID dosing. If she remains elevated, will increase to 2571mID. She is to follow with at home and we will talk next week about her progress.   CVA: No neuro changes. Continue current regimen    Medication Adjustments/Labs and Tests Ordered: Current medicines are reviewed at length with the patient today.  Concerns regarding medicines are outlined above.  Orders Placed This Encounter  Procedures   Basic metabolic panel   CBC   Meds ordered this encounter  Medications   metoprolol tartrate (LOPRESSOR) 25 MG tablet    Sig: Take 0.5 tablets (12.5 mg total) by mouth 2 (two) times daily.    Dispense:  180 tablet    Refill:  3    Patient Instructions  Medication Instructions:  Increase Metoprolol to 12.5 mg twice a day  Start, Amoxicillin 500 mg, take 4 tablets by mouth 1 hour before dental procedures and cleanings    *If you need a refill on your cardiac medications before your next appointment, please call your pharmacy*   Lab Work:  Your physician recommends that you  return for lab work in on Tuesday, May 30 Lab is open from 7:15 am to 4:30 pm  If you have labs (blood work) drawn today and your tests are completely normal, you will receive your results only by: MyChart Message (if you have MyChart) OR A paper copy in the mail If you have any lab test that is abnormal or we need to change your treatment, we will call you to review the results.   Testing/Procedures: You are scheduled for a watchman CT on 05/12/22 @ 1:00 pm. Please arrive @ 12:30 PM   Follow-Up: Follow up as scheduled    Other Instructions   Important Information About Sugar         Signed, Jill McDaniel, NP  04/09/2022 3:23 PM    [facility] 

## 2022-04-09 ENCOUNTER — Ambulatory Visit (INDEPENDENT_AMBULATORY_CARE_PROVIDER_SITE_OTHER): Payer: Medicare Other | Admitting: Cardiology

## 2022-04-09 VITALS — BP 120/64 | HR 92 | Ht 66.0 in | Wt 218.0 lb

## 2022-04-09 DIAGNOSIS — I639 Cerebral infarction, unspecified: Secondary | ICD-10-CM

## 2022-04-09 DIAGNOSIS — I10 Essential (primary) hypertension: Secondary | ICD-10-CM

## 2022-04-09 DIAGNOSIS — Z01818 Encounter for other preprocedural examination: Secondary | ICD-10-CM | POA: Diagnosis not present

## 2022-04-09 DIAGNOSIS — I479 Paroxysmal tachycardia, unspecified: Secondary | ICD-10-CM | POA: Diagnosis not present

## 2022-04-09 DIAGNOSIS — Z95818 Presence of other cardiac implants and grafts: Secondary | ICD-10-CM | POA: Diagnosis not present

## 2022-04-09 DIAGNOSIS — I4891 Unspecified atrial fibrillation: Secondary | ICD-10-CM | POA: Diagnosis not present

## 2022-04-09 MED ORDER — METOPROLOL TARTRATE 25 MG PO TABS: 12.5000 mg | ORAL_TABLET | Freq: Two times a day (BID) | ORAL | 3 refills | Status: DC

## 2022-04-09 MED ORDER — AMOXICILLIN 500 MG PO CAPS: 2000.0000 mg | ORAL_CAPSULE | ORAL | 12 refills | Status: DC

## 2022-04-09 NOTE — Patient Instructions (Addendum)
Medication Instructions:  Increase Metoprolol to 12.5 mg twice a day  Start, Amoxicillin 500 mg, take 4 tablets by mouth 1 hour before dental procedures and cleanings    *If you need a refill on your cardiac medications before your next appointment, please call your pharmacy*   Lab Work: Your physician recommends that you return for lab work in on Tuesday, May 30 Lab is open from 7:15 am to 4:30 pm  If you have labs (blood work) drawn today and your tests are completely normal, you will receive your results only by: [place] (if you have MyChart) OR A paper copy in the mail If you have any lab test that is abnormal or we need to change your treatment, we will call you to review the results.   Testing/Procedures: You are scheduled for a watchman CT on 05/12/22 @ 1:00 pm. Please arrive @ 12:30 PM   Follow-Up: Follow up as scheduled    Other Instructions   Important Information About Sugar

## 2022-04-15 ENCOUNTER — Other Ambulatory Visit: Payer: Medicare Other | Admitting: *Deleted

## 2022-04-15 DIAGNOSIS — Z01818 Encounter for other preprocedural examination: Secondary | ICD-10-CM | POA: Diagnosis not present

## 2022-04-16 LAB — BASIC METABOLIC PANEL
BUN/Creatinine Ratio: 14 (ref 12–28)
BUN: 15 mg/dL (ref 8–27)
CO2: 24 mmol/L (ref 20–29)
Calcium: 9.7 mg/dL (ref 8.7–10.3)
Chloride: 103 mmol/L (ref 96–106)
Creatinine, Ser: 1.07 mg/dL — ABNORMAL HIGH (ref 0.57–1.00)
Glucose: 102 mg/dL — ABNORMAL HIGH (ref 70–99)
Potassium: 4.6 mmol/L (ref 3.5–5.2)
Sodium: 139 mmol/L (ref 134–144)
eGFR: 55 mL/min/{1.73_m2} — ABNORMAL LOW (ref >59)

## 2022-04-16 LAB — CBC
Hematocrit: 41.2 % (ref 34.0–46.6)
Hemoglobin: 13.8 g/dL (ref 11.1–15.9)
MCH: 29.4 pg (ref 26.6–33.0)
MCHC: 33.5 g/dL (ref 31.5–35.7)
MCV: 88 fL (ref 79–97)
Platelets: 183 10*3/uL (ref 150–450)
RBC: 4.7 x10E6/uL (ref 3.77–5.28)
RDW: 12.9 % (ref 11.7–15.4)
WBC: 3.9 10*3/uL (ref 3.4–10.8)

## 2022-05-01 ENCOUNTER — Telehealth: Payer: Self-pay

## 2022-05-01 NOTE — Telephone Encounter (Signed)
Called to check-in with patient and to postpone CT for two weeks to enable further endothelization prior to medication changes.   Left message to call back to arrange CT on 7/10 or 7/11.

## 2022-05-02 NOTE — Telephone Encounter (Signed)
The patient states she is doing great and has no S/S of bleeding. Rescheduled the patient's post-Watchman CT to 05/27/2022. She understands to arrive at 1130 for 1200 scan, otherwise all other instructions given at her recent visit with [name] are the same. She was grateful for call and agrees with plan.

## 2022-05-12 ENCOUNTER — Ambulatory Visit (HOSPITAL_COMMUNITY): Payer: Medicare Other

## 2022-05-26 ENCOUNTER — Telehealth (HOSPITAL_COMMUNITY): Payer: Self-pay | Admitting: Emergency Medicine

## 2022-05-26 NOTE — Telephone Encounter (Signed)
Reaching out to patient to offer assistance regarding upcoming cardiac imaging study; pt verbalizes understanding of appt date/time, parking situation and where to check in, pre-test NPO status and medications ordered, and verified current allergies; name and call back number provided for further questions should they arise Katherine Bond RN Navigator Cardiac Imaging [facility] [phone] office [phone] cell  Double daily metop dose for HR control Denies iv issues  Arrival 1130

## 2022-05-27 ENCOUNTER — Ambulatory Visit (HOSPITAL_COMMUNITY)
Admission: RE | Admit: 2022-05-27 | Discharge: 2022-05-27 | Disposition: A | Discharge status: Home / Self Care | Payer: Medicare Other | Source: Ambulatory Visit | Attending: Cardiovascular Disease | Admitting: Cardiovascular Disease

## 2022-05-27 DIAGNOSIS — Z95818 Presence of other cardiac implants and grafts: Secondary | ICD-10-CM | POA: Diagnosis not present

## 2022-05-27 DIAGNOSIS — I4819 Other persistent atrial fibrillation: Secondary | ICD-10-CM | POA: Insufficient documentation

## 2022-05-27 MED ORDER — IOHEXOL 350 MG/ML SOLN
95.0000 mL | Freq: Once | INTRAVENOUS | Status: AC | PRN
  Administered 2022-05-27: 95 mL via INTRAVENOUS

## 2022-05-28 DIAGNOSIS — I700 Atherosclerosis of aorta: Secondary | ICD-10-CM | POA: Diagnosis not present

## 2022-05-28 DIAGNOSIS — Z0000 Encounter for general adult medical examination without abnormal findings: Secondary | ICD-10-CM | POA: Diagnosis not present

## 2022-05-28 DIAGNOSIS — D6869 Other thrombophilia: Secondary | ICD-10-CM | POA: Diagnosis not present

## 2022-05-28 DIAGNOSIS — M545 Low back pain, unspecified: Secondary | ICD-10-CM | POA: Diagnosis not present

## 2022-05-28 DIAGNOSIS — Z79899 Other long term (current) drug therapy: Secondary | ICD-10-CM | POA: Diagnosis not present

## 2022-05-28 DIAGNOSIS — I482 Chronic atrial fibrillation, unspecified: Secondary | ICD-10-CM | POA: Diagnosis not present

## 2022-05-28 DIAGNOSIS — I129 Hypertensive chronic kidney disease with stage 1 through stage 4 chronic kidney disease, or unspecified chronic kidney disease: Secondary | ICD-10-CM | POA: Diagnosis not present

## 2022-05-28 DIAGNOSIS — N1831 Chronic kidney disease, stage 3a: Secondary | ICD-10-CM | POA: Diagnosis not present

## 2022-05-29 ENCOUNTER — Telehealth: Payer: Self-pay

## 2022-05-29 DIAGNOSIS — Z95818 Presence of other cardiac implants and grafts: Secondary | ICD-10-CM

## 2022-05-29 DIAGNOSIS — I4891 Unspecified atrial fibrillation: Secondary | ICD-10-CM

## 2022-05-29 MED ORDER — METOPROLOL TARTRATE 25 MG PO TABS: 25.0000 mg | ORAL_TABLET | Freq: Two times a day (BID) | ORAL | 3 refills | Status: DC

## 2022-05-29 NOTE — Telephone Encounter (Signed)
Creatinine= 1.05 eGFR= 56

## 2022-05-29 NOTE — Telephone Encounter (Signed)
Per [name], the patient will need to continue Eliquis 2.5 mg BID and repeat CT in 4 weeks due to questionable small fabric leak.  While on the phone with the patient, she reports she has been taking her metoprolol 12.5 mg BID and her HR is still consistently 90-95 bpm. Discussed with Kathyrn Drown and instructed the patient to increase her metoprolol to 25 mg BID. Will call the patient next week to check on HR and see how she is doing.  Will schedule the patient 8/11 for CT scan and send instructions via MyChart. She was grateful for call and agrees with plan.

## 2022-06-05 NOTE — Telephone Encounter (Signed)
Left message to call back  

## 2022-06-06 ENCOUNTER — Telehealth: Payer: Self-pay

## 2022-06-06 MED ORDER — CLOPIDOGREL BISULFATE 75 MG PO TABS: 75.0000 mg | ORAL_TABLET | Freq: Every day | ORAL | 1 refills | Status: DC

## 2022-06-06 MED ORDER — PANTOPRAZOLE SODIUM 40 MG PO TBEC: 40.0000 mg | DELAYED_RELEASE_TABLET | Freq: Every day | ORAL | 1 refills | Status: DC

## 2022-06-06 NOTE — Telephone Encounter (Signed)
-----   Message from [name], MD sent at 05/29/2022  5:01 PM EDT ----- Tracie Harrier. I saw all of the back and forth on the text chain between [place] and Wes and appreciate your thoughts.   [name] - let's keep her on apixaban 2 more weeks, then transition to plavix alone through 6 months, then stop everything. I don't think we really need to re-image her.  ----- Message ----- From: [name], MD Sent: 05/29/2022  11:50 AM EDT To: [name], MD  [name], This is a data free zone. I have been taking the patient's off the [facility] and transitioning to antiplatelet as long as there is no peri-device leak. That being said I don't think its unreasonable at all to continue for a few more weeks then rescan. [name] [name], MD, [facility], [facility] Cardiac Electrophysiology   ----- Message ----- From: [name], MD Sent: 05/27/2022   3:28 PM EDT To: Josue Hector, MD; [name], MD; #  [facility]. It looks pretty good to my untrained eye, but I think I see what you're talking about. She has a hx of ICH a few years ago, but no recent bleed. I guess we keep her on apixaban 2.5 mg another 4 weeks then repeat CTA. My question is how do you think this translates to TEE criteria with <5 mm leak meeting criteria to transition to antiplatelet Rx? I'm fine with keeping her on low dose apixaban I think it may be as safe or safer than ASA/plavix anyway. Just putting this out there as we transition from TEE to CTA for surveillance post-implant.  ----- Message ----- From: Josue Hector, MD Sent: 05/27/2022   3:09 PM EDT To: [name], MD; [name], NP; #  Small fabric leak ? Risk of DRT ( device related thrombus ) sent text message for review Consider continuing anticoagulation and repeat scan in 4 weeks

## 2022-06-06 NOTE — Addendum Note (Signed)
Addended by: Harland German A on: 06/06/2022 02:54 PM   Modules accepted: Orders

## 2022-06-06 NOTE — Telephone Encounter (Signed)
See 05/29/22 phone note.   The patient states her HR has been in the 70s and BP is "perfect" after increasing her metoprolol. She will continue this dosing.  Per [name], informed the patient that instead of re-imaging, he would like to have her continue Eliquis for another week then start Plavix through the 6 month mark.  The patient will STOP ELIQUIS after 7/29 PM dose and START PLAVIX 75 mg daily on 7/30.  CT cancelled.  The patient was grateful for call and agrees with plan.

## 2022-06-09 ENCOUNTER — Telehealth: Payer: Self-pay

## 2022-06-09 NOTE — Telephone Encounter (Signed)
Pt called into the office with questions in regards to SBE after LAAO device placement on 03/27/2022. I advised the pt that she will require SBE for 6 months following LAAO placement, 09/27/2022.  In reviewing the pt's allergies she lists PCN as an allergy but has amoxicillin in her medicine list for SBE use.  The pt states that she has already taken amoxicillin and has not had a reaction. When inquiring about PCN allergy the pt thinks she may have passed out when given PCN but that was a number of years ago and most likely related to a large dose.  I offered to send in Azithromycin due to history, but the pt has a supply of amoxicillin at home and states that she will continue to take this medication for dental appointments.  The pt will contact the office with any additional questions or concerns.

## 2022-06-27 ENCOUNTER — Other Ambulatory Visit (HOSPITAL_COMMUNITY): Payer: Medicare Other

## 2022-06-30 DIAGNOSIS — Z79899 Other long term (current) drug therapy: Secondary | ICD-10-CM | POA: Diagnosis not present

## 2022-07-22 NOTE — Progress Notes (Signed)
Cardiology Office Note:    Date:  07/24/2022   ID:  GLADYSE CORVIN, DOB 23-Jul-1946, MRN [id number]  PCP:  [name], MD  Cardiologist:  Amos Micheals [name], MD    Referring MD: Garvin Fila, MD   Chief complaint: Follow up atrial fibrillation   History of Present Illness:    [name] is a 76 y.o. female with a hx of infrequent episodes of paroxysmal atrial fibrillation, previously controlled on a combination of flecainide and metoprolol.  She has no known structural heart disease.  She had a normal stress Echo in 2013 and normal Myoview study in 2008.   She was admitted from 8/27-07/19/18 after she developed acute N/V and left hemiparesis following Colonoscopy. She had been off ASA for a couple of days. She was found to have a left occipital-parietal ICH. ASA was held but resumed at DC. On metoprolol and benazepril for HTN. Echo unremarkable. Continued on Flecainide. No Afib noted. Last Ecg in Nov 2020 she was in NSR.   When seen earlier this year she was in persistent atrial fibrillation and is felt to be a poor candidate for long term [facility] due to history of intracerebral hemorrhage. At that time, she reported occasional fluttering in her chest along with shortness of breath with physical activity. After discussion, the patient was willing to proceed with further workup. CT performed 01/24/22 which showed anatomy suitable for  implant.    She underwent implant with a Watchman FLX 51m device on 03/27/22. She was continued on Eliquis 2.'5mg'$  BID for 6-8 weeks then transition to Plavix monotherapy if follow up CTA with adequate seal at the 45-day mark.  Needs SBE for 6 months duration post implant (until 09/2022).  On follow up today she notes minimal palpitations.  Does note symptoms of DOE that have not changed. No edema. No chest pain.   Past Medical History:  Diagnosis Date   Depression    Dyslipidemia    GERD (gastroesophageal reflux disease)    High risk medication use     on Flecainide since January of 2013   Hypertension    Obesity    PAF (paroxysmal atrial fibrillation) (HCC)    normal stress echo in 2011   PONV (postoperative nausea and vomiting)    Presence of Watchman left atrial appendage closure device 03/27/2022   Watchman FLX 268[facility]    Past [facility] History:  Procedure Laterality Date   +     ABDOMINAL HYSTERECTOMY     ANKLE SURGERY     APPENDECTOMY     CARDIOVASCULAR STRESS TEST  07/21/2007   EF 80%, NORMAL NONDIAGONISTIC FOR ISCHEMIA   CHOLECYSTECTOMY     EYE SURGERY     LEFT ATRIAL APPENDAGE OCCLUSION N/A 03/27/2022   Procedure: LEFT ATRIAL APPENDAGE OCCLUSION;  Surgeon: CoSherren MochaMD;  [facility];  Service: Cardiovascular;  Laterality: N/A;   STRESS ECHO TEST  07/25/2010   EF 60%   TEE WITHOUT CARDIOVERSION N/A 03/27/2022   Procedure: TRANSESOPHAGEAL ECHOCARDIOGRAM (TEE);  Surgeon: CoSherren MochaMD;  [facility];  Service: Cardiovascular;  Laterality: N/A;   WRIST SURGERY     RIGHT WRIST    Current Medications: Current Meds  Medication Sig   acetaminophen (TYLENOL) 500 MG tablet Take 500-1,000 mg by mouth every 6 (six) hours as needed for moderate pain.   amoxicillin (AMOXIL) 500 MG capsule Take 4 capsules (2,000 mg total) by  mouth as directed. Take 4 tablets 1 hour prior to dental work, including cleanings.   benazepril (LOTENSIN) 10 MG tablet Take 1 tablet (10 mg total) by mouth 2 (two) times daily.   Carboxymethylcellul-Glycerin (LUBRICATING EYE DROPS OP) Place 1 drop into both eyes daily as needed (dry eyes).   clopidogrel (PLAVIX) 75 MG tablet Take 1 tablet (75 mg total) by mouth daily.   gabapentin (NEURONTIN) 100 MG capsule Take 1 capsule (100 mg total) by mouth at bedtime.   metoprolol tartrate (LOPRESSOR) 25 MG tablet Take 1 tablet (25 mg total) by mouth 2 (two) times daily.   nitroGLYCERIN (NITROSTAT) 0.4 MG SL tablet Place 1 tablet (0.4 mg  total) under the tongue every 5 (five) minutes as needed for chest pain (x 3 doses).   pantoprazole (PROTONIX) 40 MG tablet Take 1 tablet (40 mg total) by mouth daily.   sertraline (ZOLOFT) 100 MG tablet Take 50 mg by mouth at bedtime.     Allergies:   Penicillins, Codeine, and Tape   Social History   Socioeconomic History   Marital status: Widowed    Spouse name: Not on file   Number of children: Not on file   Years of education: Not on file   Highest education level: Not on file  Occupational History   Not on file  Tobacco Use   Smoking status: Former   Smokeless tobacco: Never  Substance and Sexual Activity   Alcohol use: No   Drug use: No   Sexual activity: Yes  Other Topics Concern   Not on file  Social History Narrative   Not on file   Social Determinants of Health   Financial Resource Strain: Not on file  Food Insecurity: Not on file  Transportation Needs: Not on file  Physical Activity: Not on file  Stress: Not on file  Social Connections: Not on file     Family History: The patient's family history includes Alzheimer's disease in her mother; Cancer in her brother; Heart disease in her father; Lung cancer in her brother; Stroke in her sister. ROS:   Please see the history of present illness.     All other systems reviewed and are negative.  EKGs/Labs/Other Studies Reviewed:     Recent Labs: 04/15/2022: BUN 15; Creatinine, Ser 1.07; Hemoglobin 13.8; Platelets 183; Potassium 4.6; Sodium 139  Recent Lipid Panel    Component Value Date/Time   CHOL 97 07/14/2018 0353   TRIG 110 07/14/2018 0353   HDL 26 (L) 07/14/2018 0353   CHOLHDL 3.7 07/14/2018 0353   VLDL 22 07/14/2018 0353   LDLCALC 49 07/14/2018 0353   Dated 09/08/18: creatinine 1.26. Hgb and potassium normal. Dated 06/15/19: CBC, BMET, TSH normal Dated 10/31/20: cholesterol 132, triglycerides 167, HDL 30, LDL 49. Creatinine 1.22. otherwise CMET and CBC normal.  Dated 10/24/21: creatinine 1.19.  potassium 5.3. otherwise CMET normal.  Dated 05/28/22: cholesterol 134, triglycerides 216, HDL 28, LDL 70. Normal BMET and CBC Dated 06/30/22: normal LFTs   Cardiac studies:   Echo 07/15/18: Study Conclusions   - Left ventricle: The cavity size was normal. There was moderate   concentric hypertrophy. Systolic function was vigorous. The   estimated ejection fraction was in the range of 65% to 70%. Wall   motion was normal; there were no regional wall motion   abnormalities. - Aortic valve: There was no regurgitation. - Mitral valve: There was mild regurgitation. - Left atrium: The atrium was moderately dilated. - Right ventricle: The cavity size was  normal. Wall thickness was   normal. Systolic function was normal. - Right atrium: The atrium was moderately dilated. - Tricuspid valve: There was moderate regurgitation. - Pulmonary arteries: Systolic pressure was moderately increased.   PA peak pressure: 47 mm Hg (S). - Inferior vena cava: The vessel was normal in size.   Impressions:   - No cardiac source of emboli was indentified.   Cardiac CT/CTA   TECHNIQUE: The patient was scanned on a Siemens Somatom scanner.   FINDINGS: A 120 kV prospective scan was triggered in the descending thoracic aorta at 111 HU's. Gantry rotation speed was 280 msecs and collimation was .9 mm. No beta blockade and no NTG was given. The 3D data set was reconstructed in 5% intervals of the 60-80 % of the R-R cycle. Diastolic phases were analyzed on a dedicated work station using MPR, MIP and VRT modes. The patient received 100 cc of contrast.   Pulmonary Veins:   There is normal pulmonary vein drainage into the left atrium (2 on the right and 2 on the left, thought left sided veins fuse with a short common ostium) with ostial measurements as follows:   RUPV: 24 mm x 19 mm   RLPV: 19 mm x 16 mm   LUPV: 15 mm x 14 mm   LLPV: 12 mm x 10 mm   Left atrium and appendage:   Left atrium:  Dilated left atrium. There is no thrombus in the left atrial appendage.   Left atrial appendage: The left atrial appendage is a cauliflower morphology with two dominant lobes and one small lobe in between these.   Pre-procedural Measurements   Best phase: 40%   Landing Zone Measurements:   Left atrial appendage landing zone size 25 mm X 22 mm   Maximal length from the landing zone of main lobe is 32 mm.   Notes on transseptal puncture:   No evidence of PFO or ASD.   Mid-posterior stick advised.   Comments on device selection: Given consideration of proximal deployment of device prior to two dominant lobes, 31 mm device is worth of consideration.   Based on average landing zone 27 mm device may be suitable.   Esophagus the esophagus runs in the left atrial midline.   Aorta: Normal caliber. No dissection. Descending aortic atherosclerosis.   Main pulmonary artery: Normal caliber.   Aortic Valve:  Tri-leaflet. No calcification.   Coronary Arteries: Normal coronary origin. The study was performed without use of NTG and insufficient for plaque evaluation.   Coronary Calcium Score:   Left main: 0   Left anterior descending artery: 1   Left circumflex artery: 0   Right coronary artery: 0   Total: 1   Percentile: 29th  for age, sex, and race matched control.   Extra-cardiac findings: See attached radiology report for non-cardiac structures.   IMPRESSION: 1. There is normal variant pulmonary vein drainage into the left atrium.   2. The left atrial appendage is patent.   3. Watchman Flx maximal landing zone diameter notable for 25 mm landing zone diameter with requisite measurements as above.   4. Coronary calcium score of 1, which is 29th percentile for age, sex, and race matched control.     TEE 03/27/22: IMPRESSIONS     1. TEE guided LAAC. A 27 mm Watchman FLX device was placed. Max diameter  21.6 mm (20% compression). A small iatrogenic ASD was  present after the  procedure with L to R shunting. A small pericardial  effusion was present  before the case and did not expand.   No immediate complications observed.   2. Left ventricular ejection fraction, by estimation, is 60 to 65%. The  left ventricle has normal function. The left ventricle has no regional  wall motion abnormalities.   3. Right ventricular systolic function is normal. The right ventricular  size is normal.   4. Left atrial size was mildly dilated. No left atrial/left atrial  appendage thrombus was detected.   5. A small pericardial effusion is present. The pericardial effusion is  circumferential.   6. The mitral valve is grossly normal. Mild mitral valve regurgitation.  No evidence of mitral stenosis.   7. The aortic valve is tricuspid. Aortic valve regurgitation is not  visualized. No aortic stenosis is present.   8. There is mild (Grade II) layered plaque involving the descending  aorta.    Physical Exam:    VS:  BP 122/62   Pulse 94   Ht '5\' 6"'$  (1.676 m)   Wt 216 lb 6.4 oz (98.2 kg)   SpO2 99%   BMI 34.93 kg/m     Wt Readings from Last 3 Encounters:  07/24/22 216 lb 6.4 oz (98.2 kg)  04/09/22 218 lb (98.9 kg)  03/27/22 217 lb (98.4 kg)     GENERAL:  Well appearing WF in NAD HEENT:  PERRL, EOMI, sclera are clear. Oropharynx is clear. NECK:  No jugular venous distention, carotid upstroke brisk and symmetric, no bruits, no thyromegaly or adenopathy LUNGS:  Clear to auscultation bilaterally CHEST:  Unremarkable HEART:  IRRR,  PMI not displaced or sustained,S1 and S2 within normal limits, no S3, no S4: no clicks, no rubs, no murmurs ABD:  Soft, nontender. BS +, no masses or bruits. No hepatomegaly, no splenomegaly EXT:  2 + pulses throughout, no edema, no cyanosis no clubbing SKIN:  Warm and dry.  No rashes NEURO:  Alert and oriented x 3. Cranial nerves II through XII intact. PSYCH:  Cognitively intact    ASSESSMENT:    1. Persistent atrial  fibrillation ([place])   2. Presence of Watchman left atrial appendage closure device   3. Primary hypertension      PLAN:    1. Persistent  Afib. Patient has persistent Afib with controlled rate on Toprol XL. She failed Flecainide in the past.  Not a candidate for anticoagulation  due to history of ICH. Now s/p placement  of a Watchman LA occlusive device. Plan to continue Plavix until November. We discussed whether or not to pursue restoration of NSR. She is tolerating Afib well. Unclear if her dyspnea is related to Afib or more deconditioning/ former tobacco use. After discussion we decided to continue with rate control strategy. 2. Left parietal ICH 3. HTN. Continue benazepril and Toprol XL. Well controlled 4. Tobacco abuse now stopped.  Follow up in 6 months  Signed, Hawken Bielby [name], MD  07/24/2022 10:22 AM    [facility]

## 2022-07-24 ENCOUNTER — Ambulatory Visit: Payer: Medicare Other | Attending: Cardiology | Admitting: Cardiology

## 2022-07-24 ENCOUNTER — Encounter: Payer: Self-pay | Admitting: Cardiology

## 2022-07-24 VITALS — BP 122/62 | HR 94 | Ht 66.0 in | Wt 216.4 lb

## 2022-07-24 DIAGNOSIS — I4819 Other persistent atrial fibrillation: Secondary | ICD-10-CM | POA: Diagnosis not present

## 2022-07-24 DIAGNOSIS — I639 Cerebral infarction, unspecified: Secondary | ICD-10-CM

## 2022-07-24 DIAGNOSIS — I10 Essential (primary) hypertension: Secondary | ICD-10-CM | POA: Insufficient documentation

## 2022-07-24 DIAGNOSIS — Z95818 Presence of other cardiac implants and grafts: Secondary | ICD-10-CM | POA: Insufficient documentation

## 2022-09-17 NOTE — Progress Notes (Signed)
[facility]                                     Cardiology Office Note:    Date:  09/22/2022   ID:  [name], DOB 22-Feb-1946, MRN [id number]  PCP:  [name], MD  [facility] Cardiologist:  [name], MD/ [name], MD [name])   Referring MD: Garvin Fila, MD   Chief Complaint  Patient presents with   Follow-up    6 month s/p LAAO    History of Present Illness:    [name] is a 76 y.o. female with a hx of persistent atrial fibrillation not on [facility] due to hx of left occipital-parietal anterior cranial hemorrhage in 2019, HLD, GERD, HTN, and obesity.    [name] was referred to [name] for possible Watchman implant from [name] for long term stroke prevention. She has persistent atrial fibrillation and is felt to be a poor candidate for long term [place] due to history of intracerebral hemorrhage. She was previously in PAF however more recently was evaluated by [name] and found to be in atrial fibrillation. At that time, she reported occasional fluttering in her chest along with shortness of breath with physical activity. After discussion, the patient was willing to proceed with further workup. CT performed 01/24/22 which showed anatomy suitable for  implant.    She underwent implant with a Watchman FLX 81m device on 03/27/22. She was continued on Eliquis 2.586mBID and transitioned to Plavix monotherapy after CT imaging confirmed adequate seal at the 45-day mark.   Today she is here alone and states that she has been doing well from a CV standpoint. She denies chest pain, LE edema, orthopnea, however is having some very mild palpitations. She had an episode several weeks ago when her BP spike however this has not recurred since that time. She contacted Dr. JoMorrison Oldffice and was instructed to keep a BP log for several weeks. Her BPs have been within normal range since that time.    Past Medical History:  Diagnosis Date   Depression     Dyslipidemia    GERD (gastroesophageal reflux disease)    High risk medication use    on Flecainide since January of 2013   Hypertension    Obesity    PAF (paroxysmal atrial fibrillation) (HCC)    normal stress echo in 2011   PONV (postoperative nausea and vomiting)    Presence of Watchman left atrial appendage closure device 03/27/2022   Watchman FLX 2734mmplanted [facility]   Past Surgical History:  Procedure Laterality Date   +     ABDOMINAL HYSTERECTOMY     ANKLE SURGERY     APPENDECTOMY     CARDIOVASCULAR STRESS TEST  07/21/2007   EF 80%, NORMAL NONDIAGONISTIC FOR ISCHEMIA   CHOLECYSTECTOMY     EYE SURGERY     LEFT ATRIAL APPENDAGE OCCLUSION N/A 03/27/2022   Procedure: LEFT ATRIAL APPENDAGE OCCLUSION;  Surgeon: CooSherren MochaD;  Location: [facility] [place] [facility];  Service: Cardiovascular;  Laterality: N/A;   STRESS ECHO TEST  07/25/2010   EF 60%   TEE WITHOUT CARDIOVERSION N/A 03/27/2022   Procedure: TRANSESOPHAGEAL ECHOCARDIOGRAM (TEE);  Surgeon: CooSherren MochaD;  Location: [facility] [place] [facility];  Service: Cardiovascular;  Laterality: N/A;   WRIST SURGERY  RIGHT WRIST    Current Medications: Current Meds  Medication Sig   acetaminophen (TYLENOL) 500 MG tablet Take 500-1,000 mg by mouth every 6 (six) hours as needed for moderate pain.   benazepril (LOTENSIN) 10 MG tablet Take 1 tablet (10 mg total) by mouth 2 (two) times daily.   Carboxymethylcellul-Glycerin (LUBRICATING EYE DROPS OP) Place 1 drop into both eyes daily as needed (dry eyes).   gabapentin (NEURONTIN) 100 MG capsule Take 1 capsule (100 mg total) by mouth at bedtime.   metoprolol tartrate (LOPRESSOR) 25 MG tablet Take 1 tablet (25 mg total) by mouth 2 (two) times daily.   nitroGLYCERIN (NITROSTAT) 0.4 MG SL tablet Place 1 tablet (0.4 mg total) under the tongue every 5 (five) minutes as needed for chest pain (x 3 doses).   pantoprazole (PROTONIX) 40 MG tablet Take 1 tablet (40 mg  total) by mouth daily.   sertraline (ZOLOFT) 100 MG tablet Take 50 mg by mouth at bedtime.   [DISCONTINUED] amoxicillin (AMOXIL) 500 MG capsule Take 4 capsules (2,000 mg total) by mouth as directed. Take 4 tablets 1 hour prior to dental work, including cleanings.   [DISCONTINUED] clopidogrel (PLAVIX) 75 MG tablet Take 1 tablet (75 mg total) by mouth daily.     Allergies:   Penicillins, Codeine, and Tape   Social History   Socioeconomic History   Marital status: Widowed    Spouse name: Not on file   Number of children: Not on file   Years of education: Not on file   Highest education level: Not on file  Occupational History   Not on file  Tobacco Use   Smoking status: Former   Smokeless tobacco: Never  Substance and Sexual Activity   Alcohol use: No   Drug use: No   Sexual activity: Yes  Other Topics Concern   Not on file  Social History Narrative   Not on file   Social Determinants of Health   Financial Resource Strain: Not on file  Food Insecurity: Not on file  Transportation Needs: Not on file  Physical Activity: Not on file  Stress: Not on file  Social Connections: Not on file     Family History: The patient's family history includes Alzheimer's disease in her mother; Cancer in her brother; Heart disease in her father; Lung cancer in her brother; Stroke in her sister.  ROS:   Please see the history of present illness.    All other systems reviewed and are negative.  EKGs/Labs/Other Studies Reviewed:    The following studies were reviewed today:  CT 2022-05-28:  Cardiovascular: See dedicated report for cardiovascular details.   Mediastinum/Nodes: No acute process or signs of adenopathy in the visualized portions of the mediastinum.   Lungs/Pleura: Basilar atelectasis and mild scarring.   Upper Abdomen: No acute process in the upper abdomen.   Musculoskeletal: No acute or destructive bone finding to the extent evaluated.   IMPRESSION:   No acute or  significant extracardiac findings.  LAAO 03/27/22:   Successful LAA Occlusion with a 27 mm Watchman FLX device using fluoroscopic and TEE guidance   Recommend: apixaban 2.5 mg BID, 6-8 week CTA, clopidogrel monotherapy if criteria met by CTA   TEE 03/27/22:   1. TEE guided LAAC. A 27 mm Watchman FLX device was placed. Max diameter  21.6 mm (20% compression). A small iatrogenic ASD was present after the  procedure with L to R shunting. A small pericardial effusion was present  before the case and did  not expand.   No immediate complications observed.   2. Left ventricular ejection fraction, by estimation, is 60 to 65%. The  left ventricle has normal function. The left ventricle has no regional  wall motion abnormalities.   3. Right ventricular systolic function is normal. The right ventricular  size is normal.   4. Left atrial size was mildly dilated. No left atrial/left atrial  appendage thrombus was detected.   5. A small pericardial effusion is present. The pericardial effusion is  circumferential.   6. The mitral valve is grossly normal. Mild mitral valve regurgitation.  No evidence of mitral stenosis.   7. The aortic valve is tricuspid. Aortic valve regurgitation is not  visualized. No aortic stenosis is present.   8. There is mild (Grade II) layered plaque involving the descending  aorta.   EKG:  EKG is not ordered today.    Recent Labs: 04/15/2022: BUN 15; Creatinine, Ser 1.07; Hemoglobin 13.8; Platelets 183; Potassium 4.6; Sodium 139  Recent Lipid Panel    Component Value Date/Time   CHOL 97 07/14/2018 0353   TRIG 110 07/14/2018 0353   HDL 26 (L) 07/14/2018 0353   CHOLHDL 3.7 07/14/2018 0353   VLDL 22 07/14/2018 0353   LDLCALC 49 07/14/2018 0353   Physical Exam:    VS:  BP 118/68   Pulse 70   Ht _0  (1.676 m)   Wt 217 lb (98.4 kg)   SpO2 96%   BMI 35.02 kg/m     Wt Readings from Last 3 Encounters:  09/22/22 217 lb (98.4 kg)  07/24/22 216 lb 6.4 oz (98.2  kg)  04/09/22 218 lb (98.9 kg)   General: Well developed, well nourished, NAD Lungs:Clear to ausculation bilaterally. Cardiovascular: Irregularly irregular. No murmurs Extremities: No edema.  Neuro: Alert and oriented. No focal deficits. No facial asymmetry. MAE spontaneously. Psych: Responds to questions appropriately with normal affect.    ASSESSMENT/PLAN:    Persistent atrial fibrillation: s/p LAAO closure with Watchman 45m device. Restarted on low dose Eliquis then transitioned to Plavix 736mQD. Post procedure CT with stable device placement and no leak or thrombus. She has completed 6 months of therapy and will stop Plavix 09/28/22. Follow with Dr. JoMartinique/2024.   Tachycardia: HRs previously elevated>>treated with metoprolol 2557mID with improvement. HR today at 70bpm.    CVA: No neuro changes. Continue current regimen   Medication Adjustments/Labs and Tests Ordered: Current medicines are reviewed at length with the patient today.  Concerns regarding medicines are outlined above.  No orders of the defined types were placed in this encounter.  No orders of the defined types were placed in this encounter.   Patient Instructions  Medication Instructions:  Your physician has recommended you make the following change in your medication: Stop clopidogrel on 09/28/22   *If you need a refill on your cardiac medications before your next appointment, please call your pharmacy*   Lab Work: none If you have labs (blood work) drawn today and your tests are completely normal, you will receive your results only by: [place] you have MyChart) OR A paper copy in the mail If you have any lab test that is abnormal or we need to change your treatment, we will call you to review the results.   Testing/Procedures: none   Follow-Up: [facility] and [facility] our [facility].  As part of our continuing mission to provide you with exceptional heart  care, we have created  designated Provider Care Teams.  These Care Teams include your primary Cardiologist (physician) and Advanced Practice Providers (APPs -  Physician Assistants and Nurse Practitioners) who all work together to provide you with the care you need, when you need it.  We recommend signing up for the patient portal called "MyChart".  Sign up information is provided on this After Visit Summary.  MyChart is used to connect with patients for Virtual Visits (Telemedicine).  Patients are able to view lab/test results, encounter notes, upcoming appointments, etc.  Non-urgent messages can be sent to your provider as well.   To learn more about what you can do with MyChart, go to [email or web address].    Your next appointment:   Office will call  you to schedule follow up  The format for your next appointment:   In Person  Provider:   Structural heart team   Other Instructions   Important Information About Sugar         Signed, Kathyrn Drown, NP  09/22/2022 1:13 PM    [place]

## 2022-09-18 ENCOUNTER — Encounter: Payer: Self-pay | Admitting: Cardiology

## 2022-09-22 ENCOUNTER — Ambulatory Visit: Payer: Medicare Other | Attending: Cardiology | Admitting: Cardiology

## 2022-09-22 VITALS — BP 118/68 | HR 70 | Ht 66.0 in | Wt 217.0 lb

## 2022-09-22 DIAGNOSIS — I639 Cerebral infarction, unspecified: Secondary | ICD-10-CM | POA: Diagnosis not present

## 2022-09-22 DIAGNOSIS — I10 Essential (primary) hypertension: Secondary | ICD-10-CM | POA: Diagnosis not present

## 2022-09-22 DIAGNOSIS — Z95818 Presence of other cardiac implants and grafts: Secondary | ICD-10-CM | POA: Diagnosis not present

## 2022-09-22 DIAGNOSIS — I4819 Other persistent atrial fibrillation: Secondary | ICD-10-CM | POA: Diagnosis not present

## 2022-09-22 NOTE — Patient Instructions (Addendum)
Medication Instructions:  Your physician has recommended you make the following change in your medication: Stop clopidogrel on 09/28/22   *If you need a refill on your cardiac medications before your next appointment, please call your pharmacy*   Lab Work: none If you have labs (blood work) drawn today and your tests are completely normal, you will receive your results only by: [place] (if you have MyChart) OR A paper copy in the mail If you have any lab test that is abnormal or we need to change your treatment, we will call you to review the results.   Testing/Procedures: none   Follow-Up: At [facility], you and your health needs are our priority.  As part of our continuing mission to provide you with exceptional heart care, we have created designated Provider Care Teams.  These Care Teams include your primary Cardiologist (physician) and Advanced Practice Providers (APPs -  Physician Assistants and Nurse Practitioners) who all work together to provide you with the care you need, when you need it.  We recommend signing up for the patient portal called "MyChart".  Sign up information is provided on this After Visit Summary.  MyChart is used to connect with patients for Virtual Visits (Telemedicine).  Patients are able to view lab/test results, encounter notes, upcoming appointments, etc.  Non-urgent messages can be sent to your provider as well.   To learn more about what you can do with MyChart, go to [email or web address].    Your next appointment:   Office will call  you to schedule follow up  The format for your next appointment:   In Person  Provider:   Structural heart team   Other Instructions   Important Information About Sugar

## 2022-10-26 ENCOUNTER — Other Ambulatory Visit: Payer: Self-pay | Admitting: Cardiovascular Disease

## 2023-01-27 ENCOUNTER — Other Ambulatory Visit: Payer: Self-pay | Admitting: Internal Medicine

## 2023-01-27 ENCOUNTER — Ambulatory Visit
Admission: RE | Admit: 2023-01-27 | Discharge: 2023-01-27 | Disposition: A | Discharge status: Home / Self Care | Payer: Medicare HMO | Source: Ambulatory Visit | Attending: Internal Medicine | Admitting: Internal Medicine

## 2023-01-27 DIAGNOSIS — R1032 Left lower quadrant pain: Secondary | ICD-10-CM

## 2023-01-27 MED ORDER — IOPAMIDOL (ISOVUE-300) INJECTION 61%
80.0000 mL | Freq: Once | INTRAVENOUS | Status: AC | PRN
  Administered 2023-01-27: 80 mL via INTRAVENOUS

## 2023-01-29 ENCOUNTER — Emergency Department (HOSPITAL_BASED_OUTPATIENT_CLINIC_OR_DEPARTMENT_OTHER)
Admission: EM | Admit: 2023-01-29 | Discharge: 2023-01-29 | Disposition: A | Discharge status: Home / Self Care | Payer: Medicare HMO | Attending: Emergency Medicine | Admitting: Emergency Medicine

## 2023-01-29 ENCOUNTER — Other Ambulatory Visit: Payer: Self-pay

## 2023-01-29 DIAGNOSIS — E860 Dehydration: Secondary | ICD-10-CM | POA: Diagnosis not present

## 2023-01-29 DIAGNOSIS — Z79899 Other long term (current) drug therapy: Secondary | ICD-10-CM | POA: Insufficient documentation

## 2023-01-29 DIAGNOSIS — R112 Nausea with vomiting, unspecified: Secondary | ICD-10-CM | POA: Diagnosis present

## 2023-01-29 DIAGNOSIS — K5792 Diverticulitis of intestine, part unspecified, without perforation or abscess without bleeding: Secondary | ICD-10-CM | POA: Diagnosis not present

## 2023-01-29 DIAGNOSIS — R55 Syncope and collapse: Secondary | ICD-10-CM | POA: Insufficient documentation

## 2023-01-29 LAB — CBC
HCT: 40.6 % (ref 36.0–46.0)
Hemoglobin: 13.9 g/dL (ref 12.0–15.0)
MCH: 29.1 pg (ref 26.0–34.0)
MCHC: 34.2 g/dL (ref 30.0–36.0)
MCV: 85.1 fL (ref 80.0–100.0)
Platelets: 211 10*3/uL (ref 150–400)
RBC: 4.77 MIL/uL (ref 3.87–5.11)
RDW: 13.3 % (ref 11.5–15.5)
WBC: 9.2 10*3/uL (ref 4.0–10.5)
nRBC: 0 % (ref 0.0–0.2)

## 2023-01-29 LAB — URINALYSIS, ROUTINE W REFLEX MICROSCOPIC
Bacteria, UA: NONE SEEN
Bilirubin Urine: NEGATIVE
Glucose, UA: NEGATIVE mg/dL
Hgb urine dipstick: NEGATIVE
Nitrite: NEGATIVE
Specific Gravity, Urine: 1.024 (ref 1.005–1.030)
pH: 5 (ref 5.0–8.0)

## 2023-01-29 LAB — CBG MONITORING, ED: Glucose-Capillary: 123 mg/dL — ABNORMAL HIGH (ref 70–99)

## 2023-01-29 LAB — COMPREHENSIVE METABOLIC PANEL
ALT: 12 U/L (ref 0–44)
AST: 18 U/L (ref 15–41)
Albumin: 4 g/dL (ref 3.5–5.0)
Alkaline Phosphatase: 78 U/L (ref 38–126)
Anion gap: 13 (ref 5–15)
BUN: 18 mg/dL (ref 8–23)
CO2: 21 mmol/L — ABNORMAL LOW (ref 22–32)
Calcium: 10.1 mg/dL (ref 8.9–10.3)
Chloride: 101 mmol/L (ref 98–111)
Creatinine, Ser: 1.14 mg/dL — ABNORMAL HIGH (ref 0.44–1.00)
GFR, Estimated: 50 mL/min — ABNORMAL LOW (ref >60)
Glucose, Bld: 111 mg/dL — ABNORMAL HIGH (ref 70–99)
Potassium: 4.2 mmol/L (ref 3.5–5.1)
Sodium: 135 mmol/L (ref 135–145)
Total Bilirubin: 0.8 mg/dL (ref 0.3–1.2)
Total Protein: 7.3 g/dL (ref 6.5–8.1)

## 2023-01-29 LAB — LIPASE, BLOOD: Lipase: 16 U/L (ref 11–51)

## 2023-01-29 MED ORDER — ONDANSETRON HCL 4 MG/2ML IJ SOLN
4.0000 mg | Freq: Once | INTRAMUSCULAR | Status: AC
  Administered 2023-01-29: 4 mg via INTRAVENOUS
  Filled 2023-01-29: qty 2

## 2023-01-29 MED ORDER — LACTATED RINGERS IV BOLUS
1000.0000 mL | Freq: Once | INTRAVENOUS | Status: AC
  Administered 2023-01-29: 1000 mL via INTRAVENOUS

## 2023-01-29 MED ORDER — ONDANSETRON 4 MG PO TBDP: 4.0000 mg | ORAL_TABLET | Freq: Three times a day (TID) | ORAL | 0 refills | Status: DC | PRN

## 2023-01-29 NOTE — ED Notes (Addendum)
Asked pt if she could provide urine sample. Pt stated she is dehydrated and cannot urinate. Informed RN.

## 2023-01-29 NOTE — Discharge Instructions (Signed)
You were seen for your nausea and vomiting in the emergency department.  This is likely related to your diverticulitis.  At home, please continue your antibiotics.  Take the Zofran as needed for any nausea or vomiting that you may have.   Follow-up with your primary doctor in 2-3 days regarding your visit.  Follow-up with the GI doctors as well.  Return immediately to the emergency department if you experience any of the following: Severe abdominal pain, dizziness or fainting, or any other concerning symptoms.    Thank you for visiting our Emergency Department. It was a pleasure taking care of you today.

## 2023-01-29 NOTE — ED Triage Notes (Addendum)
Patient here POV from Home.  Endorses Lower ABD Pain that began 4-5 Days ago. Diagnosed with Diverticulitis and began Antibiotic Therapy shortly afterwards.  Nausea. Chills, Near Syncopal Episode this AM (unsure if LOC was complete, states she became lightheaded and sat down while showering and slid down). No Diarrhea. Prior to today she noted some Possible constipation.   NAD Noted during Triage. A&Ox4. GCS 15. BIB Stretcher/Wheelchair.

## 2023-01-29 NOTE — ED Notes (Signed)
Pt. States she's feeling better, denies N/V/D Pain. States she's hungry.

## 2023-01-29 NOTE — ED Provider Notes (Signed)
[facility] Provider Note   CSN: [id number] Arrival date & time: 01/29/23  1601     History  Chief Complaint  Patient presents with   Near Syncope    [name] is a 77 y.o. female.  77 year old female with a history of recently diagnosed diverticulitis who presents the emergency department with nausea and vomiting and presyncope.  Patient reports that she started having lower abdominal pain on Saturday.  Says that the next day she started having nausea and vomiting that has persisted.  On Tuesday was seen and had a CT scan which showed diverticulitis without complication.  Started on ciprofloxacin and Flagyl Tuesday evening for a 5-day course.  Since then abdominal pain has improved but she has been on a clear liquid diet and has started to feel very dizzy.  Today had an episode when she was in the shower and she felt that she was going to pass out so she slid onto the ground.  Says that afterwards had her first bowel movement in 1 week and is feeling much better.  Is requesting food at this time.  Denies any chest pain or shortness of breath prior to the presyncopal event.       Home Medications Prior to Admission medications   Medication Sig Start Date End Date Taking? Authorizing Provider  ondansetron (ZOFRAN-ODT) 4 MG disintegrating tablet Take 1 tablet (4 mg total) by mouth every 8 (eight) hours as needed for nausea or vomiting. 01/29/23  Yes Fransico Meadow, MD  acetaminophen (TYLENOL) 500 MG tablet Take 500-1,000 mg by mouth every 6 (six) hours as needed for moderate pain.    [provider]  benazepril (LOTENSIN) 10 MG tablet Take 1 tablet (10 mg total) by mouth 2 (two) times daily. 11/20/20   [name], Peter M, MD  Carboxymethylcellul-Glycerin (LUBRICATING EYE DROPS OP) Place 1 drop into both eyes daily as needed (dry eyes).    [provider]  gabapentin (NEURONTIN) 100 MG capsule Take 1 capsule (100 mg total)  by mouth at bedtime. 08/16/18   Love, Ivan Anchors, PA-C  metoprolol tartrate (LOPRESSOR) 25 MG tablet Take 1 tablet (25 mg total) by mouth 2 (two) times daily. 05/29/22 05/24/23  Kathyrn Drown D, NP  nitroGLYCERIN (NITROSTAT) 0.4 MG SL tablet Place 1 tablet (0.4 mg total) under the tongue every 5 (five) minutes as needed for chest pain (x 3 doses). 06/20/16   Theora Gianotti, NP  pantoprazole (PROTONIX) 40 MG tablet Take 1 tablet (40 mg total) by mouth daily. 06/06/22 12/03/22  [name], MD  sertraline (ZOLOFT) 100 MG tablet Take 50 mg by mouth at bedtime. 03/10/22   [provider]      Allergies    Penicillins, Codeine, and Tape    Review of Systems   Review of Systems  Physical Exam Updated Vital Signs BP 117/66   Pulse (!) 101   Temp 97.9 F (36.6 C) (Oral)   Resp 13   Ht 5\' 6"  (1.676 m)   Wt 98.4 kg   SpO2 100%   BMI 35.01 kg/m  Physical Exam Vitals and nursing note reviewed.  Constitutional:      General: She is not in acute distress.    Appearance: She is well-developed.  HENT:     Head: Normocephalic and atraumatic.     Right Ear: External ear normal.     Left Ear: External ear normal.     Nose: Nose normal.  Eyes:  Extraocular Movements: Extraocular movements intact.     Conjunctiva/sclera: Conjunctivae normal.     Pupils: Pupils are equal, round, and reactive to light.  Cardiovascular:     Rate and Rhythm: Normal rate. Rhythm irregular.     Heart sounds: No murmur heard. Pulmonary:     Effort: Pulmonary effort is normal. No respiratory distress.  Abdominal:     General: Abdomen is flat. There is no distension.     Palpations: Abdomen is soft. There is no mass.     Tenderness: There is no abdominal tenderness. There is no guarding.  Musculoskeletal:     Cervical back: Normal range of motion and neck supple.     Right lower leg: No edema.     Left lower leg: No edema.  Skin:    General: Skin is warm and dry.  Neurological:     Mental  Status: She is alert and oriented to person, place, and time. Mental status is at baseline.  Psychiatric:        Mood and Affect: Mood normal.     ED Results / Procedures / Treatments   Labs (all labs ordered are listed, but only abnormal results are displayed) Labs Reviewed  COMPREHENSIVE METABOLIC PANEL - Abnormal; Notable for the following components:      Result Value   CO2 21 (*)    Glucose, Bld 111 (*)    Creatinine, Ser 1.14 (*)    GFR, Estimated 50 (*)    All other components within normal limits  URINALYSIS, ROUTINE W REFLEX MICROSCOPIC - Abnormal; Notable for the following components:   Ketones, ur TRACE (*)    Protein, ur TRACE (*)    Leukocytes,Ua TRACE (*)    All other components within normal limits  CBG MONITORING, ED - Abnormal; Notable for the following components:   Glucose-Capillary 123 (*)    All other components within normal limits  LIPASE, BLOOD  CBC    EKG EKG Interpretation  Date/Time:  Thursday January 29 2023 16:25:02 EDT Ventricular Rate:  102 PR Interval:    QRS Duration: 92 QT Interval:  314 QTC Calculation: 409 R Axis:   52 Text Interpretation: Atrial fibrillation with rapid ventricular response ST & T wave abnormality, consider anterolateral ischemia Abnormal ECG When compared with ECG of 28-Mar-2022 06:45, No significant change since 07/13/2018 Confirmed by Margaretmary Eddy [phone]) on 01/29/2023 8:49:06 PM  Radiology No results found.  Procedures Procedures   Medications Ordered in ED Medications  ondansetron (ZOFRAN) injection 4 mg (4 mg Intravenous Given 01/29/23 1630)  lactated ringers bolus 1,000 mL (0 mLs Intravenous Stopped 01/29/23 2245)    ED Course/ Medical Decision Making/ A&P                             Medical Decision Making Amount and/or Complexity of Data Reviewed Labs: ordered.  Risk Prescription drug management.   [name] is a 77 y.o. female with comorbidities that complicate the patient evaluation  including atrial fibrillation and recent diverticulitis diagnosis who presents with a presyncopal event and nausea and vomiting  Initial Ddx:  Diverticulitis, bowel obstruction, gastroenteritis, medication side effect, dehydration/orthostasis, presyncope  MDM:  Feel the patient likely is having her nausea and vomiting related to her diverticulitis.  Has already had CT scan and reports no changes aside from the presyncopal event which is likely due to dehydration from her nausea and vomiting.  Her abdominal exam is reassuring today  so do not feel that repeat cross-sectional imaging is indicated.  Will give her fluids at this time for her lightheadedness.  Did not have any other red flags like chest pain or shortness of breath preceding her presyncopal event.  No traumatic injuries as a result of her fall.  Plan:  Labs EKG IV fluids Zofran  ED Summary/Re-evaluation:  Patient reassessed and was feeling much better.  Was able to tolerate p.o.  EKG did show atrial fibrillation without other concerning findings.  Patient was discharged home with instructions to follow-up with her primary doctor and given a prescription of Zofran.  This patient presents to the ED for concern of complaints listed in HPI, this involves an extensive number of treatment options, and is a complaint that carries with it a high risk of complications and morbidity. Disposition including potential need for admission considered.   Dispo: DC Home. Return precautions discussed including, but not limited to, those listed in the AVS. Allowed pt time to ask questions which were answered fully prior to dc.  Additional history obtained from family Records reviewed ED Visit Notes The following labs were independently interpreted: Chemistry and show no acute abnormality I personally reviewed and interpreted cardiac monitoring: atrial fibrillation (normal rate) I personally reviewed and interpreted the pt's EKG: see above for  interpretation  I have reviewed the patients home medications and made adjustments as needed Social Determinants of health:  Elderly  Final Clinical Impression(s) / ED Diagnoses Final diagnoses:  Nausea and vomiting, unspecified vomiting type  Postural dizziness with presyncope  Dehydration  Diverticulitis    Rx / DC Orders ED Discharge Orders          Ordered    ondansetron (ZOFRAN-ODT) 4 MG disintegrating tablet  Every 8 hours PRN        01/29/23 2126              Fransico Meadow, MD 01/30/23 1100

## 2023-01-29 NOTE — ED Triage Notes (Signed)
Arrived by EMS c/o abd pain, constipation followed by diarrhea. Dx with diverticulitis recently. Reports syncopal episode in the shower today. 572m bolus given by EMS

## 2023-02-24 ENCOUNTER — Telehealth: Payer: Self-pay

## 2023-02-24 NOTE — Telephone Encounter (Signed)
Called to check in with patient, who had LAAO on 03/27/2022. The patient reports doing well with no issues.  Scheduled her for routine follow-up with Dr. [place] in May 2024. She was grateful for call and agreed with plan.

## 2023-03-02 ENCOUNTER — Other Ambulatory Visit: Payer: Self-pay | Admitting: Cardiovascular Disease

## 2023-03-21 NOTE — Progress Notes (Deleted)
Cardiology Office Note:    Date:  07/24/2022   ID:  [name], DOB 07/07/1946, MRN [id number]  PCP:  Stoneking, Hal, MD  Cardiologist:  Demari Kropp, MD    Referring MD: Sethi, Pramod S, MD   Chief complaint: Follow up atrial fibrillation   History of Present Illness:    [name] is a 77 y.o. female with a hx of infrequent episodes of paroxysmal atrial fibrillation, previously controlled on a combination of flecainide and metoprolol.  She has no known structural heart disease.  She had a normal stress Echo in 2013 and normal Myoview study in 2008.   She was admitted from 8/27-07/19/18 after she developed acute N/V and left hemiparesis following Colonoscopy. She had been off ASA for a couple of days. She was found to have a left occipital-parietal ICH. ASA was held but resumed at DC. On metoprolol and benazepril for HTN. Echo unremarkable. Continued on Flecainide. No Afib noted. Last Ecg in Nov 2020 she was in NSR.   When seen earlier this year she was in persistent atrial fibrillation and is felt to be a poor candidate for long term OAC due to history of intracerebral hemorrhage. At that time, she reported occasional fluttering in her chest along with shortness of breath with physical activity. After discussion, the patient was willing to proceed with further workup. CT performed 01/24/22 which showed anatomy suitable for  implant.    She underwent implant with a Watchman FLX 27mm device on 03/27/22. She was continued on Eliquis 2.5mg BID for 6-8 weeks then transition to Plavix monotherapy if follow up CTA with adequate seal at the 45-day mark.  Needs SBE for 6 months duration post implant (until 09/2022).  On follow up today she notes minimal palpitations.  Does note symptoms of DOE that have not changed. No edema. No chest pain.   Past Medical History:  Diagnosis Date   Depression    Dyslipidemia    GERD (gastroesophageal reflux disease)    High risk medication use     on Flecainide since January of 2013   Hypertension    Obesity    PAF (paroxysmal atrial fibrillation) (HCC)    normal stress echo in 2011   PONV (postoperative nausea and vomiting)    Presence of Watchman left atrial appendage closure device 03/27/2022   Watchman FLX 27mm implanted by Dr. Cooper   Stroke (HCC)     Past Surgical History:  Procedure Laterality Date   +     ABDOMINAL HYSTERECTOMY     ANKLE SURGERY     APPENDECTOMY     CARDIOVASCULAR STRESS TEST  07/21/2007   EF 80%, NORMAL NONDIAGONISTIC FOR ISCHEMIA   CHOLECYSTECTOMY     EYE SURGERY     LEFT ATRIAL APPENDAGE OCCLUSION N/A 03/27/2022   Procedure: LEFT ATRIAL APPENDAGE OCCLUSION;  Surgeon: Cooper, Michael, MD;  Location: [facility];  Service: Cardiovascular;  Laterality: N/A;   STRESS ECHO TEST  07/25/2010   EF 60%   TEE WITHOUT CARDIOVERSION N/A 03/27/2022   Procedure: TRANSESOPHAGEAL ECHOCARDIOGRAM (TEE);  Surgeon: Cooper, Michael, MD;  Location: [facility];  Service: Cardiovascular;  Laterality: N/A;   WRIST SURGERY     RIGHT WRIST    Current Medications: Current Meds  Medication Sig   acetaminophen (TYLENOL) 500 MG tablet Take 500-1,000 mg by mouth every 6 (six) hours as needed for moderate pain.   amoxicillin (AMOXIL) 500 MG capsule Take 4 capsules (2,000 mg total) by   mouth as directed. Take 4 tablets 1 hour prior to dental work, including cleanings.   benazepril (LOTENSIN) 10 MG tablet Take 1 tablet (10 mg total) by mouth 2 (two) times daily.   Carboxymethylcellul-Glycerin (LUBRICATING EYE DROPS OP) Place 1 drop into both eyes daily as needed (dry eyes).   clopidogrel (PLAVIX) 75 MG tablet Take 1 tablet (75 mg total) by mouth daily.   gabapentin (NEURONTIN) 100 MG capsule Take 1 capsule (100 mg total) by mouth at bedtime.   metoprolol tartrate (LOPRESSOR) 25 MG tablet Take 1 tablet (25 mg total) by mouth 2 (two) times daily.   nitroGLYCERIN (NITROSTAT) 0.4 MG SL tablet Place 1 tablet (0.4 mg  total) under the tongue every 5 (five) minutes as needed for chest pain (x 3 doses).   pantoprazole (PROTONIX) 40 MG tablet Take 1 tablet (40 mg total) by mouth daily.   sertraline (ZOLOFT) 100 MG tablet Take 50 mg by mouth at bedtime.     Allergies:   Penicillins, Codeine, and Tape   Social History   Socioeconomic History   Marital status: Widowed    Spouse name: Not on file   Number of children: Not on file   Years of education: Not on file   Highest education level: Not on file  Occupational History   Not on file  Tobacco Use   Smoking status: Former   Smokeless tobacco: Never  Substance and Sexual Activity   Alcohol use: No   Drug use: No   Sexual activity: Yes  Other Topics Concern   Not on file  Social History Narrative   Not on file   Social Determinants of Health   Financial Resource Strain: Not on file  Food Insecurity: Not on file  Transportation Needs: Not on file  Physical Activity: Not on file  Stress: Not on file  Social Connections: Not on file     Family History: The patient's family history includes Alzheimer's disease in her mother; Cancer in her brother; Heart disease in her father; Lung cancer in her brother; Stroke in her sister. ROS:   Please see the history of present illness.     All other systems reviewed and are negative.  EKGs/Labs/Other Studies Reviewed:     Recent Labs: 04/15/2022: BUN 15; Creatinine, Ser 1.07; Hemoglobin 13.8; Platelets 183; Potassium 4.6; Sodium 139  Recent Lipid Panel    Component Value Date/Time   CHOL 97 07/14/2018 0353   TRIG 110 07/14/2018 0353   HDL 26 (L) 07/14/2018 0353   CHOLHDL 3.7 07/14/2018 0353   VLDL 22 07/14/2018 0353   LDLCALC 49 07/14/2018 0353   Dated 09/08/18: creatinine 1.26. Hgb and potassium normal. Dated 06/15/19: CBC, BMET, TSH normal Dated 10/31/20: cholesterol 132, triglycerides 167, HDL 30, LDL 49. Creatinine 1.22. otherwise CMET and CBC normal.  Dated 10/24/21: creatinine 1.19.  potassium 5.3. otherwise CMET normal.  Dated 05/28/22: cholesterol 134, triglycerides 216, HDL 28, LDL 70. Normal BMET and CBC Dated 06/30/22: normal LFTs   Cardiac studies:   Echo 07/15/18: Study Conclusions   - Left ventricle: The cavity size was normal. There was moderate   concentric hypertrophy. Systolic function was vigorous. The   estimated ejection fraction was in the range of 65% to 70%. Wall   motion was normal; there were no regional wall motion   abnormalities. - Aortic valve: There was no regurgitation. - Mitral valve: There was mild regurgitation. - Left atrium: The atrium was moderately dilated. - Right ventricle: The cavity size was   normal. Wall thickness was   normal. Systolic function was normal. - Right atrium: The atrium was moderately dilated. - Tricuspid valve: There was moderate regurgitation. - Pulmonary arteries: Systolic pressure was moderately increased.   PA peak pressure: 47 mm Hg (S). - Inferior vena cava: The vessel was normal in size.   Impressions:   - No cardiac source of emboli was indentified.   Cardiac CT/CTA   TECHNIQUE: The patient was scanned on a Siemens Somatom scanner.   FINDINGS: A 120 kV prospective scan was triggered in the descending thoracic aorta at 111 HU's. Gantry rotation speed was 280 msecs and collimation was .9 mm. No beta blockade and no NTG was given. The 3D data set was reconstructed in 5% intervals of the 60-80 % of the R-R cycle. Diastolic phases were analyzed on a dedicated work station using MPR, MIP and VRT modes. The patient received 100 cc of contrast.   Pulmonary Veins:   There is normal pulmonary vein drainage into the left atrium (2 on the right and 2 on the left, thought left sided veins fuse with a short common ostium) with ostial measurements as follows:   RUPV: 24 mm x 19 mm   RLPV: 19 mm x 16 mm   LUPV: 15 mm x 14 mm   LLPV: 12 mm x 10 mm   Left atrium and appendage:   Left atrium:  Dilated left atrium. There is no thrombus in the left atrial appendage.   Left atrial appendage: The left atrial appendage is a cauliflower morphology with two dominant lobes and one small lobe in between these.   Pre-procedural Measurements   Best phase: 40%   Landing Zone Measurements:   Left atrial appendage landing zone size 25 mm X 22 mm   Maximal length from the landing zone of main lobe is 32 mm.   Notes on transseptal puncture:   No evidence of PFO or ASD.   Mid-posterior stick advised.   Comments on device selection: Given consideration of proximal deployment of device prior to two dominant lobes, 31 mm device is worth of consideration.   Based on average landing zone 27 mm device may be suitable.   Esophagus the esophagus runs in the left atrial midline.   Aorta: Normal caliber. No dissection. Descending aortic atherosclerosis.   Main pulmonary artery: Normal caliber.   Aortic Valve:  Tri-leaflet. No calcification.   Coronary Arteries: Normal coronary origin. The study was performed without use of NTG and insufficient for plaque evaluation.   Coronary Calcium Score:   Left main: 0   Left anterior descending artery: 1   Left circumflex artery: 0   Right coronary artery: 0   Total: 1   Percentile: 29th  for age, sex, and race matched control.   Extra-cardiac findings: See attached radiology report for non-cardiac structures.   IMPRESSION: 1. There is normal variant pulmonary vein drainage into the left atrium.   2. The left atrial appendage is patent.   3. Watchman Flx maximal landing zone diameter notable for 25 mm landing zone diameter with requisite measurements as above.   4. Coronary calcium score of 1, which is 29th percentile for age, sex, and race matched control.     TEE 03/27/22: IMPRESSIONS     1. TEE guided LAAC. A 27 mm Watchman FLX device was placed. Max diameter  21.6 mm (20% compression). A small iatrogenic ASD was  present after the  procedure with L to R shunting. A small pericardial   effusion was present  before the case and did not expand.   No immediate complications observed.   2. Left ventricular ejection fraction, by estimation, is 60 to 65%. The  left ventricle has normal function. The left ventricle has no regional  wall motion abnormalities.   3. Right ventricular systolic function is normal. The right ventricular  size is normal.   4. Left atrial size was mildly dilated. No left atrial/left atrial  appendage thrombus was detected.   5. A small pericardial effusion is present. The pericardial effusion is  circumferential.   6. The mitral valve is grossly normal. Mild mitral valve regurgitation.  No evidence of mitral stenosis.   7. The aortic valve is tricuspid. Aortic valve regurgitation is not  visualized. No aortic stenosis is present.   8. There is mild (Grade II) layered plaque involving the descending  aorta.    Physical Exam:    VS:  BP 122/62   Pulse 94   Ht 5' 6" (1.676 m)   Wt 216 lb 6.4 oz (98.2 kg)   SpO2 99%   BMI 34.93 kg/m     Wt Readings from Last 3 Encounters:  07/24/22 216 lb 6.4 oz (98.2 kg)  04/09/22 218 lb (98.9 kg)  03/27/22 217 lb (98.4 kg)     GENERAL:  Well appearing WF in NAD HEENT:  PERRL, EOMI, sclera are clear. Oropharynx is clear. NECK:  No jugular venous distention, carotid upstroke brisk and symmetric, no bruits, no thyromegaly or adenopathy LUNGS:  Clear to auscultation bilaterally CHEST:  Unremarkable HEART:  IRRR,  PMI not displaced or sustained,S1 and S2 within normal limits, no S3, no S4: no clicks, no rubs, no murmurs ABD:  Soft, nontender. BS +, no masses or bruits. No hepatomegaly, no splenomegaly EXT:  2 + pulses throughout, no edema, no cyanosis no clubbing SKIN:  Warm and dry.  No rashes NEURO:  Alert and oriented x 3. Cranial nerves II through XII intact. PSYCH:  Cognitively intact    ASSESSMENT:    1. Persistent atrial  fibrillation (HCC)   2. Presence of Watchman left atrial appendage closure device   3. Primary hypertension      PLAN:    1. Persistent  Afib. Patient has persistent Afib with controlled rate on Toprol XL. She failed Flecainide in the past.  Not a candidate for anticoagulation  due to history of ICH. Now s/p placement  of a Watchman LA occlusive device. Plan to continue Plavix until November. We discussed whether or not to pursue restoration of NSR. She is tolerating Afib well. Unclear if her dyspnea is related to Afib or more deconditioning/ former tobacco use. After discussion we decided to continue with rate control strategy. 2. Left parietal ICH 3. HTN. Continue benazepril and Toprol XL. Well controlled 4. Tobacco abuse now stopped.  Follow up in 6 months  Signed, Jakara Blatter, MD  07/24/2022 10:22 AM    [facility] 

## 2023-03-26 ENCOUNTER — Ambulatory Visit: Payer: Medicare HMO | Admitting: Cardiology

## 2023-05-13 ENCOUNTER — Other Ambulatory Visit: Payer: Self-pay | Admitting: Gastroenterology

## 2023-06-04 ENCOUNTER — Other Ambulatory Visit: Payer: Self-pay | Admitting: Internal Medicine

## 2023-06-04 DIAGNOSIS — Z122 Encounter for screening for malignant neoplasm of respiratory organs: Secondary | ICD-10-CM

## 2023-06-26 ENCOUNTER — Other Ambulatory Visit: Payer: Medicare HMO

## 2023-07-07 ENCOUNTER — Encounter (HOSPITAL_COMMUNITY): Payer: Self-pay | Admitting: Gastroenterology

## 2023-07-14 ENCOUNTER — Ambulatory Visit (HOSPITAL_COMMUNITY): Admission: RE | Admit: 2023-07-14 | Payer: Medicare HMO | Source: Home / Self Care | Admitting: Gastroenterology

## 2023-07-14 ENCOUNTER — Encounter (HOSPITAL_COMMUNITY): Admission: RE | Payer: Self-pay | Source: Home / Self Care

## 2023-07-14 SURGERY — COLONOSCOPY WITH PROPOFOL
Anesthesia: Monitor Anesthesia Care

## 2023-07-29 ENCOUNTER — Encounter (HOSPITAL_BASED_OUTPATIENT_CLINIC_OR_DEPARTMENT_OTHER): Payer: Self-pay

## 2023-07-29 ENCOUNTER — Emergency Department (HOSPITAL_BASED_OUTPATIENT_CLINIC_OR_DEPARTMENT_OTHER)
Admission: EM | Admit: 2023-07-29 | Discharge: 2023-07-29 | Disposition: A | Discharge status: Home / Self Care | Payer: Medicare HMO | Attending: Emergency Medicine | Admitting: Emergency Medicine

## 2023-07-29 ENCOUNTER — Other Ambulatory Visit: Payer: Self-pay

## 2023-07-29 DIAGNOSIS — D72829 Elevated white blood cell count, unspecified: Secondary | ICD-10-CM | POA: Insufficient documentation

## 2023-07-29 DIAGNOSIS — R109 Unspecified abdominal pain: Secondary | ICD-10-CM | POA: Diagnosis present

## 2023-07-29 DIAGNOSIS — N10 Acute tubulo-interstitial nephritis: Secondary | ICD-10-CM | POA: Diagnosis not present

## 2023-07-29 LAB — CBC
HCT: 41.2 % (ref 36.0–46.0)
Hemoglobin: 14.1 g/dL (ref 12.0–15.0)
MCH: 29.2 pg (ref 26.0–34.0)
MCHC: 34.2 g/dL (ref 30.0–36.0)
MCV: 85.3 fL (ref 80.0–100.0)
Platelets: 203 10*3/uL (ref 150–400)
RBC: 4.83 MIL/uL (ref 3.87–5.11)
RDW: 13.2 % (ref 11.5–15.5)
WBC: 11.7 10*3/uL — ABNORMAL HIGH (ref 4.0–10.5)
nRBC: 0 % (ref 0.0–0.2)

## 2023-07-29 LAB — URINALYSIS, ROUTINE W REFLEX MICROSCOPIC
Bilirubin Urine: NEGATIVE
Glucose, UA: NEGATIVE mg/dL
Ketones, ur: NEGATIVE mg/dL
Nitrite: POSITIVE — AB
Protein, ur: NEGATIVE mg/dL
Specific Gravity, Urine: 1.019 (ref 1.005–1.030)
WBC, UA: >50 WBC/hpf (ref 0–5)
pH: 5 (ref 5.0–8.0)

## 2023-07-29 LAB — BASIC METABOLIC PANEL
Anion gap: 11 (ref 5–15)
BUN: 19 mg/dL (ref 8–23)
CO2: 22 mmol/L (ref 22–32)
Calcium: 9.9 mg/dL (ref 8.9–10.3)
Chloride: 104 mmol/L (ref 98–111)
Creatinine, Ser: 1.16 mg/dL — ABNORMAL HIGH (ref 0.44–1.00)
GFR, Estimated: 49 mL/min — ABNORMAL LOW (ref >60)
Glucose, Bld: 131 mg/dL — ABNORMAL HIGH (ref 70–99)
Potassium: 4.1 mmol/L (ref 3.5–5.1)
Sodium: 137 mmol/L (ref 135–145)

## 2023-07-29 MED ORDER — CEPHALEXIN 500 MG PO CAPS: 500.0000 mg | ORAL_CAPSULE | Freq: Three times a day (TID) | ORAL | 0 refills | Status: DC

## 2023-07-29 MED ORDER — SODIUM CHLORIDE 0.9 % IV SOLN
1.0000 g | Freq: Once | INTRAVENOUS | Status: AC
  Administered 2023-07-29: 1 g via INTRAVENOUS
  Filled 2023-07-29: qty 10

## 2023-07-29 NOTE — ED Provider Notes (Signed)
[name] EMERGENCY DEPARTMENT AT [facility] Provider Note   CSN: [id number] Arrival date & time: 07/29/23  0002     History  Chief Complaint  Patient presents with   Flank Pain    [name] is a 77 y.o. female.  Patient is a 77 year old female presenting with a 2-day history of pain to her right back.  This afternoon, she began to feel nauseated and vomited.  She has had a kidney infection in the past and this feels similar.  She denies fevers or chills.  She denies chest pain or difficulty breathing.  No aggravating or alleviating factors.  The history is provided by the patient.       Home Medications Prior to Admission medications   Medication Sig Start Date End Date Taking? Authorizing Provider  acetaminophen (TYLENOL) 500 MG tablet Take 500-1,000 mg by mouth every 6 (six) hours as needed for moderate pain.    [provider]  benazepril (LOTENSIN) 10 MG tablet Take 1 tablet (10 mg total) by mouth 2 (two) times daily. 11/20/20   [place], Peter M, MD  Carboxymethylcellul-Glycerin (LUBRICATING EYE DROPS OP) Place 1 drop into both eyes daily as needed (dry eyes).    [provider]  gabapentin (NEURONTIN) 100 MG capsule Take 1 capsule (100 mg total) by mouth at bedtime. 08/16/18   Love, Evlyn Kanner, PA-C  metoprolol tartrate (LOPRESSOR) 25 MG tablet Take 1 tablet (25 mg total) by mouth 2 (two) times daily. 05/29/22 05/24/23  Georgie Chard D, NP  nitroGLYCERIN (NITROSTAT) 0.4 MG SL tablet Place 1 tablet (0.4 mg total) under the tongue every 5 (five) minutes as needed for chest pain (x 3 doses). 06/20/16   Creig Hines, NP  ondansetron (ZOFRAN-ODT) 4 MG disintegrating tablet Take 1 tablet (4 mg total) by mouth every 8 (eight) hours as needed for nausea or vomiting. 01/29/23   Rondel Baton, MD  pantoprazole (PROTONIX) 40 MG tablet TAKE 1 TABLET BY MOUTH EVERY DAY 03/03/23   Tonny Bollman, MD  sertraline (ZOLOFT) 100 MG tablet Take 50 mg  by mouth at bedtime. 03/10/22   [provider]      Allergies    Penicillins, Codeine, and Tape    Review of Systems   Review of Systems  All other systems reviewed and are negative.   Physical Exam Updated Vital Signs BP 132/76 (BP Location: Right Arm)   Pulse 74   Temp 98.4 F (36.9 C) (Oral)   Resp 19   Ht 5\' 6"  (1.676 m)   Wt 95.3 kg   SpO2 95%   BMI 33.89 kg/m  Physical Exam Vitals and nursing note reviewed.  Constitutional:      General: She is not in acute distress.    Appearance: She is well-developed. She is not diaphoretic.  HENT:     Head: Normocephalic and atraumatic.  Cardiovascular:     Rate and Rhythm: Normal rate and regular rhythm.     Heart sounds: No murmur heard.    No friction rub. No gallop.  Pulmonary:     Effort: Pulmonary effort is normal. No respiratory distress.     Breath sounds: Normal breath sounds. No wheezing.  Abdominal:     General: Bowel sounds are normal. There is no distension.     Palpations: Abdomen is soft.     Tenderness: There is no abdominal tenderness. There is right CVA tenderness. There is no left CVA tenderness, guarding or rebound.  Musculoskeletal:  General: Normal range of motion.     Cervical back: Normal range of motion and neck supple.  Skin:    General: Skin is warm and dry.  Neurological:     General: No focal deficit present.     Mental Status: She is alert and oriented to person, place, and time.     ED Results / Procedures / Treatments   Labs (all labs ordered are listed, but only abnormal results are displayed) Labs Reviewed  BASIC METABOLIC PANEL - Abnormal; Notable for the following components:      Result Value   Glucose, Bld 131 (*)    Creatinine, Ser 1.16 (*)    GFR, Estimated 49 (*)    All other components within normal limits  CBC - Abnormal; Notable for the following components:   WBC 11.7 (*)    All other components within normal limits  URINALYSIS, ROUTINE W REFLEX  MICROSCOPIC - Abnormal; Notable for the following components:   APPearance HAZY (*)    Hgb urine dipstick SMALL (*)    Nitrite POSITIVE (*)    Leukocytes,Ua LARGE (*)    Bacteria, UA MANY (*)    All other components within normal limits    EKG None  Radiology No results found.  Procedures Procedures    Medications Ordered in ED Medications  cefTRIAXone (ROCEPHIN) 1 g in sodium chloride 0.9 % 100 mL IVPB (has no administration in time range)    ED Course/ Medical Decision Making/ A&P  Patient is a 77 year old female presenting with complaints of right lower back pain along with nausea and vomiting.  She denies burning with urination, but has had a kidney infection in the past that has felt the same.  She arrives here with stable vital signs and is afebrile.  Abdomen is benign.  Workup initiated including CBC, basic metabolic panel.  White count is 11.7, but laboratory studies otherwise unremarkable.  Urinalysis is consistent with UTI.  Patient to be treated for pyelonephritis.  She was given IV Rocephin and will be discharged with Keflex.  To return as needed if symptoms worsen or change.  Final Clinical Impression(s) / ED Diagnoses Final diagnoses:  None    Rx / DC Orders ED Discharge Orders     None         Geoffery Lyons, MD 07/29/23 [phone]

## 2023-07-29 NOTE — ED Triage Notes (Addendum)
Pt bib [facility] reporting R flank pain x3 days and nvd past 4 hours. Pain feels similar to kidney infection in the past. Hx afib, diverticulitis, ambulatory in triage. Given zofran and NS pta with improvement

## 2023-07-29 NOTE — Discharge Instructions (Signed)
Begin taking Keflex as prescribed.  Return to the emergency department if you develop worsening pain, high fevers, or for other new and concerning symptoms.

## 2023-08-18 ENCOUNTER — Other Ambulatory Visit: Payer: Self-pay | Admitting: Cardiology

## 2023-11-19 ENCOUNTER — Other Ambulatory Visit: Payer: Self-pay | Admitting: Cardiology

## 2023-11-20 ENCOUNTER — Other Ambulatory Visit: Payer: Self-pay | Admitting: Cardiology

## 2023-12-08 ENCOUNTER — Other Ambulatory Visit: Payer: Self-pay | Admitting: Cardiology

## 2023-12-08 ENCOUNTER — Ambulatory Visit
Admission: RE | Admit: 2023-12-08 | Discharge: 2023-12-08 | Disposition: A | Discharge status: Home / Self Care | Payer: Medicare HMO | Source: Ambulatory Visit | Attending: Internal Medicine | Admitting: Internal Medicine

## 2023-12-08 ENCOUNTER — Other Ambulatory Visit: Payer: Self-pay | Admitting: Internal Medicine

## 2023-12-08 DIAGNOSIS — R059 Cough, unspecified: Secondary | ICD-10-CM

## 2024-01-13 ENCOUNTER — Other Ambulatory Visit: Payer: Self-pay | Admitting: Cardiology

## 2024-01-23 ENCOUNTER — Other Ambulatory Visit: Payer: Self-pay | Admitting: Cardiology

## 2024-02-03 ENCOUNTER — Other Ambulatory Visit: Payer: Self-pay | Admitting: Cardiology

## 2024-03-06 ENCOUNTER — Other Ambulatory Visit: Payer: Self-pay | Admitting: Cardiology

## 2024-03-24 ENCOUNTER — Other Ambulatory Visit: Payer: Self-pay | Admitting: Cardiology

## 2024-03-24 NOTE — Telephone Encounter (Signed)
 This Pt of Dr. [place] was last seen in 09/2022. Please advise

## 2024-03-25 NOTE — Telephone Encounter (Signed)
 Spoke to patient appointment scheduled with Dr.Jordan 6/18 at 9:20 am.

## 2024-04-29 NOTE — Progress Notes (Signed)
 Cardiology Office Note:    Date:  05/04/2024   ID:  [name], DOB 27-Jan-1946, MRN [id number]  PCP:  Dwight Trula SQUIBB, MD  Cardiologist:  Yer Castello [place], MD    Referring MD: No ref. provider found   Chief complaint: Follow up atrial fibrillation   History of Present Illness:    [name] is a 78 y.o. female with a hx of infrequent episodes of paroxysmal atrial fibrillation, previously controlled on a combination of flecainide  and metoprolol .  She has no known structural heart disease.  She had a normal stress Echo in 2013 and normal Myoview  study in 2008.   She was admitted from 8/27-07/19/18 after she developed acute N/V and left hemiparesis following Colonoscopy. She had been off ASA for a couple of days. She was found to have a left occipital-parietal ICH. ASA was held but resumed at DC. On metoprolol  and benazepril  for HTN. Echo unremarkable. Continued on Flecainide . No Afib noted.   When seen March 2023 she was in persistent atrial fibrillation and is felt to be a poor candidate for long term OAC due to history of intracerebral hemorrhage. At that time, she reported occasional fluttering in her chest along with shortness of breath with physical activity. After discussion, the patient was willing to proceed with further workup. CT performed 01/24/22 which showed anatomy suitable for  implant.    She underwent implant with a Watchman FLX 27mm device on 03/27/22. She was continued on Eliquis  2.5mg  BID for 6-8 weeks then transition to Plavix  monotherapy if follow up CTA with adequate seal at the 45-day mark.  Needs SBE for 6 months duration post implant (until 09/2022).  On follow up today she is doing well. She has noted over the past year some episodes where she will break out in a sweat and get weak. Minimal nausea and lightheadedness. Lasts 1-2 minutes. Checked her sugar and it was ok. Didn't check pulse.   Past Medical History:  Diagnosis Date   Depression    Dyslipidemia     GERD (gastroesophageal reflux disease)    High risk medication use    on Flecainide  since January of 2013   Hypertension    Obesity    PAF (paroxysmal atrial fibrillation) (HCC)    normal stress echo in 2011   PONV (postoperative nausea and vomiting)    Presence of Watchman left atrial appendage closure device 03/27/2022   Watchman FLX 27mm implanted by Dr. Wonda   Stroke [facility])     Past Surgical History:  Procedure Laterality Date   +     ABDOMINAL HYSTERECTOMY     ANKLE SURGERY     APPENDECTOMY     CARDIOVASCULAR STRESS TEST  07/21/2007   EF 80%, NORMAL NONDIAGONISTIC FOR ISCHEMIA   CHOLECYSTECTOMY     EYE SURGERY     LEFT ATRIAL APPENDAGE OCCLUSION N/A 03/27/2022   Procedure: LEFT ATRIAL APPENDAGE OCCLUSION;  Surgeon: Wonda Sharper, MD;  Location: [facility] INVASIVE CV LAB;  Service: Cardiovascular;  Laterality: N/A;   STRESS ECHO TEST  07/25/2010   EF 60%   TEE WITHOUT CARDIOVERSION N/A 03/27/2022   Procedure: TRANSESOPHAGEAL ECHOCARDIOGRAM (TEE);  Surgeon: Wonda Sharper, MD;  Location: [facility] INVASIVE CV LAB;  Service: Cardiovascular;  Laterality: N/A;   WRIST SURGERY     RIGHT WRIST    Current Medications: Current Meds  Medication Sig   acetaminophen  (TYLENOL ) 500 MG tablet Take 500-1,000 mg by mouth every 6 (six) hours as needed for moderate pain.  benazepril  (LOTENSIN ) 10 MG tablet Take 1 tablet (10 mg total) by mouth 2 (two) times daily.   Carboxymethylcellul-Glycerin (LUBRICATING EYE DROPS OP) Place 1 drop into both eyes daily as needed (dry eyes).   gabapentin  (NEURONTIN ) 100 MG capsule Take 1 capsule (100 mg total) by mouth at bedtime.   metoprolol  succinate (TOPROL  XL) 25 MG 24 hr tablet Take 1 tablet (25 mg total) by mouth daily.   nitroGLYCERIN  (NITROSTAT ) 0.4 MG SL tablet Place 1 tablet (0.4 mg total) under the tongue every 5 (five) minutes as needed for chest pain (x 3 doses).   pantoprazole  (PROTONIX ) 40 MG tablet TAKE 1 TABLET BY MOUTH EVERY DAY   sertraline   (ZOLOFT ) 100 MG tablet Take 50 mg by mouth at bedtime.   [DISCONTINUED] metoprolol  tartrate (LOPRESSOR ) 25 MG tablet TAKE 1 TABLET BY MOUTH TWICE A DAY     Allergies:   Penicillins, Codeine , Penicillin g, and Tape   Social History   Socioeconomic History   Marital status: Widowed    Spouse name: Not on file   Number of children: Not on file   Years of education: Not on file   Highest education level: Not on file  Occupational History   Not on file  Tobacco Use   Smoking status: Former   Smokeless tobacco: Never  Substance and Sexual Activity   Alcohol use: No   Drug use: No   Sexual activity: Yes  Other Topics Concern   Not on file  Social History Narrative   Not on file   Social Drivers of Health   Financial Resource Strain: Not on file  Food Insecurity: Not on file  Transportation Needs: Not on file  Physical Activity: Not on file  Stress: Not on file  Social Connections: Not on file     Family History: The patient's family history includes Alzheimer's disease in her mother; Cancer in her brother; Heart disease in her father; Lung cancer in her brother; Stroke in her sister. ROS:   Please see the history of present illness.     All other systems reviewed and are negative.  EKGs/Labs/Other Studies Reviewed:    EKG Interpretation Date/Time:  Wednesday May 04 2024 09:10:48 EDT Ventricular Rate:  66 PR Interval:    QRS Duration:  84 QT Interval:  394 QTC Calculation: 413 R Axis:   33  Text Interpretation: Atrial fibrillation Septal infarct , age undetermined ST & T wave abnormality, consider anterolateral ischemia When compared with ECG of 29-Jan-2023 16:25, Vent. rate has decreased BY  36 BPM Septal infarct is now Present T wave inversion less evident in Lateral leads Confirmed by [place], Amogh Komatsu [id number]) on 05/04/2024 9:20:38 AM    Recent Labs: 07/29/2023: BUN 19; Creatinine, Ser 1.16; Hemoglobin 14.1; Platelets 203; Potassium 4.1; Sodium 137  Recent Lipid  Panel    Component Value Date/Time   CHOL 97 07/14/2018 0353   TRIG 110 07/14/2018 0353   HDL 26 (L) 07/14/2018 0353   CHOLHDL 3.7 07/14/2018 0353   VLDL 22 07/14/2018 0353   LDLCALC 49 07/14/2018 0353   Dated 09/08/18: creatinine 1.26. Hgb and potassium normal. Dated 06/15/19: CBC, BMET, TSH normal Dated 10/31/20: cholesterol 132, triglycerides 167, HDL 30, LDL 49. Creatinine 1.22. otherwise CMET and CBC normal.  Dated 10/24/21: creatinine 1.19. potassium 5.3. otherwise CMET normal.  Dated 05/28/22: cholesterol 134, triglycerides 216, HDL 28, LDL 70. Normal BMET and CBC Dated 06/30/22: normal LFTs   Cardiac studies:   Echo 07/15/18: Study Conclusions   -  Left ventricle: The cavity size was normal. There was moderate   concentric hypertrophy. Systolic function was vigorous. The   estimated ejection fraction was in the range of 65% to 70%. Wall   motion was normal; there were no regional wall motion   abnormalities. - Aortic valve: There was no regurgitation. - Mitral valve: There was mild regurgitation. - Left atrium: The atrium was moderately dilated. - Right ventricle: The cavity size was normal. Wall thickness was   normal. Systolic function was normal. - Right atrium: The atrium was moderately dilated. - Tricuspid valve: There was moderate regurgitation. - Pulmonary arteries: Systolic pressure was moderately increased.   PA peak pressure: 47 mm Hg (S). - Inferior vena cava: The vessel was normal in size.   Impressions:   - No cardiac source of emboli was indentified.   Cardiac CT/CTA   TECHNIQUE: The patient was scanned on a Siemens Somatom scanner.   FINDINGS: A 120 kV prospective scan was triggered in the descending thoracic aorta at 111 HU's. Gantry rotation speed was 280 msecs and collimation was .9 mm. No beta blockade and no NTG was given. The 3D data set was reconstructed in 5% intervals of the 60-80 % of the R-R cycle. Diastolic phases were analyzed on a  dedicated work station using MPR, MIP and VRT modes. The patient received 100 cc of contrast.   Pulmonary Veins:   There is normal pulmonary vein drainage into the left atrium (2 on the right and 2 on the left, thought left sided veins fuse with a short common ostium) with ostial measurements as follows:   RUPV: 24 mm x 19 mm   RLPV: 19 mm x 16 mm   LUPV: 15 mm x 14 mm   LLPV: 12 mm x 10 mm   Left atrium and appendage:   Left atrium: Dilated left atrium. There is no thrombus in the left atrial appendage.   Left atrial appendage: The left atrial appendage is a cauliflower morphology with two dominant lobes and one small lobe in between these.   Pre-procedural Measurements   Best phase: 40%   Landing Zone Measurements:   Left atrial appendage landing zone size 25 mm X 22 mm   Maximal length from the landing zone of main lobe is 32 mm.   Notes on transseptal puncture:   No evidence of PFO or ASD.   Mid-posterior stick advised.   Comments on device selection: Given consideration of proximal deployment of device prior to two dominant lobes, 31 mm device is worth of consideration.   Based on average landing zone 27 mm device may be suitable.   Esophagus the esophagus runs in the left atrial midline.   Aorta: Normal caliber. No dissection. Descending aortic atherosclerosis.   Main pulmonary artery: Normal caliber.   Aortic Valve:  Tri-leaflet. No calcification.   Coronary Arteries: Normal coronary origin. The study was performed without use of NTG and insufficient for plaque evaluation.   Coronary Calcium Score:   Left main: 0   Left anterior descending artery: 1   Left circumflex artery: 0   Right coronary artery: 0   Total: 1   Percentile: 29th  for age, sex, and race matched control.   Extra-cardiac findings: See attached radiology report for non-cardiac structures.   IMPRESSION: 1. There is normal variant pulmonary vein drainage into the  left atrium.   2. The left atrial appendage is patent.   3. Watchman Flx maximal landing zone diameter notable for 25  mm landing zone diameter with requisite measurements as above.   4. Coronary calcium score of 1, which is 29th percentile for age, sex, and race matched control.     TEE 03/27/22: IMPRESSIONS     1. TEE guided LAAC. A 27 mm Watchman FLX device was placed. Max diameter  21.6 mm (20% compression). A small iatrogenic ASD was present after the  procedure with L to R shunting. A small pericardial effusion was present  before the case and did not expand.   No immediate complications observed.   2. Left ventricular ejection fraction, by estimation, is 60 to 65%. The  left ventricle has normal function. The left ventricle has no regional  wall motion abnormalities.   3. Right ventricular systolic function is normal. The right ventricular  size is normal.   4. Left atrial size was mildly dilated. No left atrial/left atrial  appendage thrombus was detected.   5. A small pericardial effusion is present. The pericardial effusion is  circumferential.   6. The mitral valve is grossly normal. Mild mitral valve regurgitation.  No evidence of mitral stenosis.   7. The aortic valve is tricuspid. Aortic valve regurgitation is not  visualized. No aortic stenosis is present.   8. There is mild (Grade II) layered plaque involving the descending  aorta.    Physical Exam:    VS:  BP 112/72 (BP Location: Left Arm, Patient Position: Sitting)   Pulse 66   Ht 5' 6 (1.676 m)   Wt 214 lb 9.6 oz (97.3 kg)   SpO2 99%   BMI 34.64 kg/m     Wt Readings from Last 3 Encounters:  05/04/24 214 lb 9.6 oz (97.3 kg)  07/29/23 210 lb (95.3 kg)  01/29/23 216 lb 14.9 oz (98.4 kg)     GENERAL:  Well appearing WF in NAD HEENT:  PERRL, EOMI, sclera are clear. Oropharynx is clear. NECK:  No jugular venous distention, carotid upstroke brisk and symmetric, no bruits, no thyromegaly or  adenopathy LUNGS:  Clear to auscultation bilaterally CHEST:  Unremarkable HEART:  IRRR,  PMI not displaced or sustained,S1 and S2 within normal limits, no S3, no S4: no clicks, no rubs, no murmurs ABD:  Soft, nontender. BS +, no masses or bruits. No hepatomegaly, no splenomegaly EXT:  2 + pulses throughout, no edema, no cyanosis no clubbing SKIN:  Warm and dry.  No rashes NEURO:  Alert and oriented x 3. Cranial nerves II through XII intact. PSYCH:  Cognitively intact    ASSESSMENT:    1. Persistent atrial fibrillation (HCC)   2. Primary hypertension   3. Presence of Watchman left atrial appendage closure device       PLAN:    1. Persistent  Afib. Patient has persistent Afib with controlled rate on metoprolol .  She failed Flecainide  in the past.  Not a candidate for anticoagulation  due to history of ICH. Now s/p placement  of a Watchman LA occlusive device. It is possible he episodes of diaphoresis and weakness could be related to bradycardia. I will reduce metoprolol  to Toprol  XL 25 mg daily. If she has recurrence spells will have her wear an event monitor. Otherwise follow up in one year 2. Left parietal ICH 3. HTN. Continue benazepril  and Toprol  XL. Well controlled 4. Tobacco abuse quit > 6 years ago  Follow up in one year  Signed, Kellee Sittner [place], MD  05/04/2024 9:28 AM    [facility]

## 2024-05-04 ENCOUNTER — Ambulatory Visit: Attending: Cardiology | Admitting: Cardiology

## 2024-05-04 ENCOUNTER — Encounter: Payer: Self-pay | Admitting: Cardiology

## 2024-05-04 VITALS — BP 112/72 | HR 66 | Ht 66.0 in | Wt 214.6 lb

## 2024-05-04 DIAGNOSIS — I4819 Other persistent atrial fibrillation: Secondary | ICD-10-CM

## 2024-05-04 DIAGNOSIS — Z95818 Presence of other cardiac implants and grafts: Secondary | ICD-10-CM | POA: Diagnosis not present

## 2024-05-04 DIAGNOSIS — I10 Essential (primary) hypertension: Secondary | ICD-10-CM | POA: Diagnosis not present

## 2024-05-04 MED ORDER — NITROGLYCERIN 0.4 MG SL SUBL: 0.4000 mg | SUBLINGUAL_TABLET | SUBLINGUAL | 11 refills | Status: AC | PRN

## 2024-05-04 MED ORDER — METOPROLOL SUCCINATE ER 25 MG PO TB24: 25.0000 mg | ORAL_TABLET | Freq: Every day | ORAL | 3 refills | Status: AC

## 2024-05-04 NOTE — Patient Instructions (Signed)
 Medication Instructions:  Stop Metoprolol  Tartrate Start Metoprolol  Succinate 25 mg daily Continue all other medications *If you need a refill on your cardiac medications before your next appointment, please call your pharmacy*  Lab Work: None ordered  Testing/Procedures: None ordered  Follow-Up: At [facility], you and your health needs are our priority.  As part of our continuing mission to provide you with exceptional heart care, our providers are all part of one team.  This team includes your primary Cardiologist (physician) and Advanced Practice Providers or APPs (Physician Assistants and Nurse Practitioners) who all work together to provide you with the care you need, when you need it.  Your next appointment:  1 year   Call in March to schedule June appointment     Provider:  Dr.Jordan   We recommend signing up for the patient portal called MyChart.  Sign up information is provided on this After Visit Summary.  MyChart is used to connect with patients for Virtual Visits (Telemedicine).  Patients are able to view lab/test results, encounter notes, upcoming appointments, etc.  Non-urgent messages can be sent to your provider as well.   To learn more about what you can do with MyChart, go to [email or web address].

## 2024-05-04 NOTE — Addendum Note (Signed)
 Addended by: Sherryn Donalds on: 05/04/2024 09:38 AM   Modules accepted: Orders

## 2024-05-14 ENCOUNTER — Other Ambulatory Visit: Payer: Self-pay | Admitting: Cardiovascular Disease

## 2024-06-08 ENCOUNTER — Other Ambulatory Visit: Payer: Self-pay | Admitting: Internal Medicine

## 2024-06-08 DIAGNOSIS — Z122 Encounter for screening for malignant neoplasm of respiratory organs: Secondary | ICD-10-CM
